# Patient Record
Sex: Female | Born: 1940 | Race: White | Hispanic: No | Smoking: Former smoker
Health system: Southern US, Community
[De-identification: ages and names within clinical notes are randomized; demographics above are authoritative.]

## PROBLEM LIST (undated history)

## (undated) DIAGNOSIS — K5792 Diverticulitis of intestine, part unspecified, without perforation or abscess without bleeding: Secondary | ICD-10-CM

## (undated) DIAGNOSIS — E785 Hyperlipidemia, unspecified: Secondary | ICD-10-CM

## (undated) DIAGNOSIS — M8580 Other specified disorders of bone density and structure, unspecified site: Secondary | ICD-10-CM

## (undated) DIAGNOSIS — N390 Urinary tract infection, site not specified: Secondary | ICD-10-CM

## (undated) DIAGNOSIS — R112 Nausea with vomiting, unspecified: Secondary | ICD-10-CM

## (undated) DIAGNOSIS — E559 Vitamin D deficiency, unspecified: Secondary | ICD-10-CM

## (undated) DIAGNOSIS — K589 Irritable bowel syndrome without diarrhea: Secondary | ICD-10-CM

## (undated) DIAGNOSIS — H269 Unspecified cataract: Secondary | ICD-10-CM

## (undated) DIAGNOSIS — K649 Unspecified hemorrhoids: Secondary | ICD-10-CM

## (undated) DIAGNOSIS — Z9889 Other specified postprocedural states: Secondary | ICD-10-CM

## (undated) DIAGNOSIS — I700 Atherosclerosis of aorta: Secondary | ICD-10-CM

## (undated) DIAGNOSIS — H9190 Unspecified hearing loss, unspecified ear: Secondary | ICD-10-CM

## (undated) DIAGNOSIS — K5730 Diverticulosis of large intestine without perforation or abscess without bleeding: Secondary | ICD-10-CM

## (undated) DIAGNOSIS — K56609 Unspecified intestinal obstruction, unspecified as to partial versus complete obstruction: Secondary | ICD-10-CM

## (undated) DIAGNOSIS — M810 Age-related osteoporosis without current pathological fracture: Secondary | ICD-10-CM

## (undated) DIAGNOSIS — I10 Essential (primary) hypertension: Secondary | ICD-10-CM

## (undated) DIAGNOSIS — K219 Gastro-esophageal reflux disease without esophagitis: Secondary | ICD-10-CM

## (undated) HISTORY — PX: APPENDECTOMY: SHX54

## (undated) HISTORY — DX: Gastro-esophageal reflux disease without esophagitis: K21.9

## (undated) HISTORY — PX: HEMORRHOID SURGERY: SHX153

## (undated) HISTORY — PX: ABDOMINAL HYSTERECTOMY: SHX81

## (undated) HISTORY — DX: Unspecified hearing loss, unspecified ear: H91.90

## (undated) HISTORY — DX: Irritable bowel syndrome, unspecified: K58.9

## (undated) HISTORY — DX: Hyperlipidemia, unspecified: E78.5

## (undated) HISTORY — PX: TONSILLECTOMY: SUR1361

## (undated) HISTORY — PX: EYE SURGERY: SHX253

## (undated) HISTORY — DX: Age-related osteoporosis without current pathological fracture: M81.0

## (undated) HISTORY — DX: Essential (primary) hypertension: I10

## (undated) HISTORY — DX: Vitamin D deficiency, unspecified: E55.9

## (undated) HISTORY — PX: CATARACT EXTRACTION: SUR2

## (undated) HISTORY — DX: Unspecified intestinal obstruction, unspecified as to partial versus complete obstruction: K56.609

## (undated) HISTORY — DX: Unspecified hemorrhoids: K64.9

## (undated) HISTORY — DX: Unspecified cataract: H26.9

## (undated) HISTORY — PX: BILATERAL OOPHORECTOMY: SHX1221

## (undated) HISTORY — PX: PARTIAL HYSTERECTOMY: SHX80

## (undated) HISTORY — PX: BREAST LUMPECTOMY: SHX2

---

## 2000-02-05 ENCOUNTER — Other Ambulatory Visit: Admission: RE | Admit: 2000-02-05 | Discharge: 2000-02-05 | Payer: Self-pay | Admitting: Obstetrics & Gynecology

## 2000-12-09 ENCOUNTER — Encounter: Payer: Self-pay | Admitting: Family Medicine

## 2000-12-09 ENCOUNTER — Encounter: Admission: RE | Admit: 2000-12-09 | Discharge: 2000-12-09 | Payer: Self-pay | Admitting: Family Medicine

## 2005-06-16 ENCOUNTER — Other Ambulatory Visit: Admission: RE | Admit: 2005-06-16 | Discharge: 2005-06-16 | Payer: Self-pay | Admitting: Obstetrics & Gynecology

## 2006-03-03 ENCOUNTER — Ambulatory Visit: Payer: Self-pay | Admitting: Cardiology

## 2006-04-21 ENCOUNTER — Ambulatory Visit: Payer: Self-pay | Admitting: Gastroenterology

## 2006-04-30 ENCOUNTER — Encounter: Payer: Self-pay | Admitting: Family Medicine

## 2006-04-30 ENCOUNTER — Ambulatory Visit: Payer: Self-pay | Admitting: Gastroenterology

## 2006-05-12 ENCOUNTER — Ambulatory Visit: Payer: Self-pay

## 2006-12-28 ENCOUNTER — Encounter: Admission: RE | Admit: 2006-12-28 | Discharge: 2006-12-28 | Payer: Self-pay | Admitting: Obstetrics & Gynecology

## 2006-12-31 ENCOUNTER — Encounter (INDEPENDENT_AMBULATORY_CARE_PROVIDER_SITE_OTHER): Payer: Self-pay | Admitting: *Deleted

## 2006-12-31 ENCOUNTER — Encounter: Admission: RE | Admit: 2006-12-31 | Discharge: 2006-12-31 | Payer: Self-pay | Admitting: Obstetrics & Gynecology

## 2007-01-21 ENCOUNTER — Encounter (INDEPENDENT_AMBULATORY_CARE_PROVIDER_SITE_OTHER): Payer: Self-pay | Admitting: *Deleted

## 2007-01-21 ENCOUNTER — Encounter: Admission: RE | Admit: 2007-01-21 | Discharge: 2007-01-21 | Payer: Self-pay | Admitting: General Surgery

## 2007-01-21 ENCOUNTER — Ambulatory Visit (HOSPITAL_BASED_OUTPATIENT_CLINIC_OR_DEPARTMENT_OTHER): Admission: RE | Admit: 2007-01-21 | Discharge: 2007-01-21 | Payer: Self-pay | Admitting: General Surgery

## 2010-08-06 ENCOUNTER — Encounter
Admission: RE | Admit: 2010-08-06 | Discharge: 2010-10-23 | Payer: Self-pay | Source: Home / Self Care | Attending: Family Medicine | Admitting: Family Medicine

## 2010-09-04 ENCOUNTER — Telehealth (INDEPENDENT_AMBULATORY_CARE_PROVIDER_SITE_OTHER): Payer: Self-pay | Admitting: Radiology

## 2010-09-08 ENCOUNTER — Encounter: Payer: Self-pay | Admitting: Cardiology

## 2010-09-08 ENCOUNTER — Ambulatory Visit: Payer: Self-pay | Admitting: Cardiology

## 2010-09-08 ENCOUNTER — Encounter (HOSPITAL_COMMUNITY)
Admission: RE | Admit: 2010-09-08 | Discharge: 2010-10-25 | Payer: Self-pay | Source: Home / Self Care | Attending: Family Medicine | Admitting: Family Medicine

## 2010-09-08 ENCOUNTER — Ambulatory Visit: Payer: Self-pay

## 2010-10-26 ENCOUNTER — Encounter: Admission: RE | Admit: 2010-10-26 | Payer: Self-pay | Source: Home / Self Care | Admitting: Family Medicine

## 2010-11-25 NOTE — Procedures (Signed)
Summary: Colonoscopy Report/[facility]  Colonoscopy Report/[facility]   Imported By: Maryln Gottron 03/19/2010 14:18:37  _____________________________________________________________________  External Attachment:    Type:   Image     Comment:   External Document

## 2010-11-25 NOTE — Progress Notes (Signed)
Summary: Nuc Pre-Procedure  Phone Note Outgoing Call Call back at [facility] Phone [phone]   Call placed by: Leonia Corona, RT-N,  September 04, 2010 2:54 PM Call placed to: Patient Reason for Call: Confirm/change Appt Summary of Call: Left message with information on Myoview Information Sheet (see scanned document for details).      Nuclear Med Background Indications for Stress Test: Evaluation for Ischemia   History: Myocardial Perfusion Study  History Comments: 2007- Normal MPS(+EKG). EF= 76%     Nuclear Pre-Procedure Cardiac Risk Factors: Family History - CAD, History of Smoking, Lipids

## 2010-11-25 NOTE — Assessment & Plan Note (Signed)
Summary: Cardiology Nuclear Testing  Nuclear Med Background Indications for Stress Test: Evaluation for Ischemia   History: Myocardial Perfusion Study  History Comments: '07 NWG:NFAOZH, (+EKG), EF=76%   Symptoms Comments: No cardiac complaints   Nuclear Pre-Procedure Cardiac Risk Factors: Family History - CAD, History of Smoking, Hypertension, Lipids Caffeine/Decaff Intake: None NPO After: 10:00 PM Lungs: Clear IV 0.9% NS with Angio Cath: 20g     IV Site: R Antecubital IV Started by: Stanton Kidney, EMT-P Chest Size (in) 34     Cup Size B     Height (in): 60.5 Weight (lb): 119 BMI: 22.94  Nuclear Med Study 1 or 2 day study:  1 day     Stress Test Type:  Stress Reading MD:  Marca Ancona, MD     Referring MD:  Rudi Heap, MD Resting Radionuclide:  Technetium 66m Tetrofosmin     Resting Radionuclide Dose:  11 mCi  Stress Radionuclide:  Technetium 41m Tetrofosmin     Stress Radionuclide Dose:  33 mCi   Stress Protocol Exercise Time (min):  7:45 min     Max HR:  151 bpm     Predicted Max HR:  151 bpm  Max Systolic BP: 216 mm Hg     Percent Max HR:  100 %     METS: 9.7 Rate Pressure Product:  08657    Stress Test Technologist:  [employer], CMA-N     Nuclear Technologist:  Domenic Polite, CNMT  Rest Procedure  Myocardial perfusion imaging was performed at rest 45 minutes following the intravenous administration of Technetium 48m Tetrofosmin.  Stress Procedure  The patient exercised for 7:45.  The patient stopped due to fatigue and denied any chest pain.  There were ST-T wave changes and brief runs of nonsustained v-tach.  She had a mild hypertensive response to exercise, 216/53.  Technetium 24m Tetrofosmin was injected at peak exercise and myocardial perfusion imaging was performed after a brief delay.  QPS Raw Data Images:  Normal; no motion artifact; normal heart/lung ratio. Stress Images:  Normal homogeneous uptake in all areas of the myocardium. Rest Images:  Normal  homogeneous uptake in all areas of the myocardium. Subtraction (SDS):  There is no evidence of scar or ischemia. Transient Ischemic Dilatation:  0.93  (Normal <1.22)  Lung/Heart Ratio:  0.25  (Normal <0.45)  Quantitative Gated Spect Images QGS EDV:  49 ml QGS ESV:  9 ml QGS EF:  82 % QGS cine images:  Normal wall motion.    Overall Impression  Exercise Capacity: Good exercise capacity. BP Response: Hypertensive blood pressure response. Clinical Symptoms: Fatigue, no chest pain.  ECG Impression: 1-2 mm horizontal ST depression inferior leads and V4-V6.  Resolved by 2 minutes in recovery.  Overall Impression: Normal scintographic images but positive ECG.  She had the same findings on ETT-myoview in 2007 (normal perfusion but positive ECG).  Overall Impression Comments: Transient ischemic dilation ratio 0.93.   Appended Document: Cardiology Nuclear Testing copy faxed to Dr .Christell Constant

## 2011-03-13 NOTE — Op Note (Signed)
Dorothy Pitts, Dorothy Pitts             ACCOUNT NO.:  [id number]   MEDICAL RECORD NO.:  [id number]          PATIENT TYPE:  AMB   LOCATION:  DSC                          FACILITY:  [facility]   PHYSICIAN:  Rose Phi. Maple Hudson, M.D.   DATE OF BIRTH:  08-20-1941   DATE OF PROCEDURE:  01/21/2007  DATE OF DISCHARGE:                               OPERATIVE REPORT   PREOPERATIVE DIAGNOSIS:  Sclerosing lesion left breast.   POSTOPERATIVE DIAGNOSIS:  Sclerosing lesion left breast.   OPERATION:  Left breast biopsy with needle localization and specimen  mammogram.   SURGEON:  Rose Phi. Maple Hudson, M.D.   ANESTHESIA:  MAC.   OPERATIVE PROCEDURE:  Prior to coming to the operating room, a  localizing wire had been placed at the area of the sclerosing lesion.  The wire was in the lesion but the previously placed clip had migrated  and was quite a ways away so our goal was not to remove the clip but to  get the sclerosing lesion.   The patient placed on the operating table, the arms extended on the arm  board.  The left breast was prepped and draped in the usual fashion.  The approach was from the 3 o'clock position laterally, so I outlined  the radial incision incorporating the wire.  The area was then  thoroughly infiltrated with local anesthetic.  Incision was made and a  wide excision of the wire and surrounding tissue was carried out.   Specimen mammogram confirmed the removal of the lesion but not the clip.   With good hemostasis, the subcuticular closure of 4-0 Monocryl and an  application of Dermabond was carried out.  A light dressing was applied.  The patient transferred to the recovery room in satisfactory condition  having tolerated the procedure well.      Rose Phi. Maple Hudson, M.D.  Electronically Signed     PRY/MEDQ  D:  01/21/2007  T:  01/21/2007  Job:  [id number]

## 2011-07-15 ENCOUNTER — Encounter: Payer: Self-pay | Admitting: Gastroenterology

## 2012-07-15 ENCOUNTER — Encounter: Payer: Self-pay | Admitting: Gastroenterology

## 2012-07-22 ENCOUNTER — Encounter: Payer: Self-pay | Admitting: Gastroenterology

## 2012-08-29 ENCOUNTER — Ambulatory Visit (AMBULATORY_SURGERY_CENTER): Payer: Medicare Other | Admitting: *Deleted

## 2012-08-29 ENCOUNTER — Encounter: Payer: Self-pay | Admitting: Gastroenterology

## 2012-08-29 VITALS — Ht 60.0 in | Wt 122.1 lb

## 2012-08-29 DIAGNOSIS — Z86010 Personal history of colonic polyps: Secondary | ICD-10-CM

## 2012-08-29 DIAGNOSIS — Z1211 Encounter for screening for malignant neoplasm of colon: Secondary | ICD-10-CM

## 2012-08-29 MED ORDER — MOVIPREP 100 G PO SOLR: ORAL | Status: DC

## 2012-08-29 NOTE — Progress Notes (Signed)
Patient denies ANY family history of colon cancer. She has 1 brother, he has never had cancer. Last colonoscopy report states family hx colon cancer in sibling at age 71, patient denies this.

## 2012-09-09 ENCOUNTER — Ambulatory Visit (AMBULATORY_SURGERY_CENTER): Payer: Medicare Other | Admitting: Gastroenterology

## 2012-09-09 ENCOUNTER — Encounter: Payer: Self-pay | Admitting: Gastroenterology

## 2012-09-09 VITALS — BP 151/70 | HR 71 | Temp 98.4°F | Resp 29 | Ht 60.0 in | Wt 122.0 lb

## 2012-09-09 DIAGNOSIS — K5730 Diverticulosis of large intestine without perforation or abscess without bleeding: Secondary | ICD-10-CM

## 2012-09-09 DIAGNOSIS — Z1211 Encounter for screening for malignant neoplasm of colon: Secondary | ICD-10-CM

## 2012-09-09 MED ORDER — SODIUM CHLORIDE 0.9 % IV SOLN: 500.0000 mL | INTRAVENOUS | Status: DC

## 2012-09-09 NOTE — Progress Notes (Addendum)
Pt c/o of a "tummy ache,"  Rates pain as a 5.  HOB down and knees to chest.  Pt states she doesn't feel the urge to pass air. 1405-Levsin 0.125mg  2 tablets SL given and pt turned to right side.  Encouraged to pass air.  1415- States pain is a little better, rates as a "3."  Pt turned back to Left side  1427- Pt's abdomen still distended.  Up to BR in order to try to pass air  1440- Pt states she was able to pass a lot of air; no further complaints of pain   Patient did not experience any of the following events: a burn prior to discharge; a fall within the facility; wrong site/side/patient/procedure/implant event; or a hospital transfer or hospital admission upon discharge from the facility. [phone]) Patient did not have preoperative order for IV antibiotic SSI prophylaxis. [id number])

## 2012-09-09 NOTE — Op Note (Signed)
[facility] [address] [place], [address]   COLONOSCOPY PROCEDURE REPORT  PATIENT: Dorothy Pitts, Dorothy Pitts  MR#: [id number] BIRTHDATE: 08-Jun-1941 , 71  yrs. old GENDER: Female ENDOSCOPIST: Mardella Layman, MD, [facility] REFERRED BY: PROCEDURE DATE:  09/09/2012 PROCEDURE:   Colonoscopy, screening ASA CLASS:   Class II INDICATIONS:average risk patient for colon cancer. MEDICATIONS: propofol (Diprivan) 200mg  IV  DESCRIPTION OF PROCEDURE:   After the risks and benefits and of the procedure were explained, informed consent was obtained.  A digital rectal exam revealed no abnormalities of the rectum.    The LB [id number]  endoscope was introduced through the anus and advanced to the cecum, which was identified by both the appendix and ileocecal valve .  The quality of the prep was good, using MoviPrep .  The instrument was then slowly withdrawn as the colon was fully examined.     COLON FINDINGS: There was severe diverticulosis noted in the descending colon and sigmoid colon with associated luminal narrowing and muscular hypertrophy.   A normal appearing cecum, ileocecal valve, and appendiceal orifice were identified.  The ascending, hepatic flexure, transverse, splenic flexure, descending, sigmoid colon and rectum appeared unremarkable.  No polyps or cancers were seen.     Retroflexed views revealed no abnormalities.     The scope was then withdrawn from the patient and the procedure completed.  COMPLICATIONS: There were no complications. ENDOSCOPIC IMPRESSION: 1.   There was severe diverticulosis noted in the descending colon and sigmoid colon 2.   Normal colon ,.,,no polyps or cancer  RECOMMENDATIONS: Continue current colorectal screening recommendations for "routine risk" patients with a repeat colonoscopy in 10 years.   REPEAT EXAM:  XB:JYNWGN Christell Constant, MD  _______________________________ eSigned:  Mardella Layman, MD, [facility] 09/09/2012 1:54  PM

## 2012-09-09 NOTE — Patient Instructions (Addendum)
YOU HAD AN ENDOSCOPIC PROCEDURE TODAY AT THE [facility]: Refer to the procedure report that was given to you for any specific questions about what was found during the examination.  If the procedure report does not answer your questions, please call your gastroenterologist to clarify.  If you requested that your care partner not be given the details of your procedure findings, then the procedure report has been included in a sealed envelope for you to review at your convenience later.  YOU SHOULD EXPECT: Some feelings of bloating in the abdomen. Passage of more gas than usual.  Walking can help get rid of the air that was put into your GI tract during the procedure and reduce the bloating. If you had a lower endoscopy (such as a colonoscopy or flexible sigmoidoscopy) you may notice spotting of blood in your stool or on the toilet paper. If you underwent a bowel prep for your procedure, then you may not have a normal bowel movement for a few days.  DIET: Your first meal following the procedure should be a light meal and then it is ok to progress to your normal diet.  A half-sandwich or bowl of soup is an example of a good first meal.  Heavy or fried foods are harder to digest and may make you feel nauseous or bloated.  Likewise meals heavy in dairy and vegetables can cause extra gas to form and this can also increase the bloating.  Drink plenty of fluids but you should avoid alcoholic beverages for 24 hours.  ACTIVITY: Your care partner should take you home directly after the procedure.  You should plan to take it easy, moving slowly for the rest of the day.  You can resume normal activity the day after the procedure however you should NOT DRIVE or use heavy machinery for 24 hours (because of the sedation medicines used during the test).    SYMPTOMS TO REPORT IMMEDIATELY: A gastroenterologist can be reached at any hour.  During normal business hours, 8:30 AM to 5:00 PM Monday through Friday,  call [phone].  After hours and on weekends, please call the GI answering service at [phone] who will take a message and have the physician on call contact you.   Following lower endoscopy (colonoscopy or flexible sigmoidoscopy):  Excessive amounts of blood in the stool  Significant tenderness or worsening of abdominal pains  Swelling of the abdomen that is new, acute  Fever of 100F or higher FOLLOW UP:  Our staff will call the home number listed on your records the next business day following your procedure to check on you and address any questions or concerns that you may have at that time regarding the information given to you following your procedure. This is a courtesy call and so if there is no answer at the home number and we have not heard from you through the emergency physician on call, we will assume that you have returned to your regular daily activities without incident.  SIGNATURES/CONFIDENTIALITY: You and/or your care partner have signed paperwork which will be entered into your electronic medical record.  These signatures attest to the fact that that the information above on your After Visit Summary has been reviewed and is understood.  Full responsibility of the confidentiality of this discharge information lies with you and/or your care-partner.   Ok to continue your normal medications  Follow up colonoscopy in 10 years  Please read over information given about diverticulosis and high fiber diets

## 2012-09-12 ENCOUNTER — Telehealth: Payer: Self-pay

## 2012-09-12 NOTE — Telephone Encounter (Signed)
  Follow up Call-  Call back number 09/09/2012  Post procedure Call Back phone  # [phone]  Permission to leave phone message Yes     Patient questions:  Do you have a fever, pain , or abdominal swelling? no Pain Score  0 *  Have you tolerated food without any problems? yes  Have you been able to return to your normal activities? yes  Do you have any questions about your discharge instructions: Diet   no Medications  no Follow up visit  no  Do you have questions or concerns about your Care? no  Actions: * If pain score is 4 or above: No action needed, pain <4.

## 2013-01-12 ENCOUNTER — Encounter: Payer: Self-pay | Admitting: Family Medicine

## 2013-01-12 ENCOUNTER — Ambulatory Visit: Payer: Medicare Other

## 2013-01-12 ENCOUNTER — Other Ambulatory Visit: Payer: Self-pay | Admitting: Nurse Practitioner

## 2013-01-12 ENCOUNTER — Ambulatory Visit (INDEPENDENT_AMBULATORY_CARE_PROVIDER_SITE_OTHER): Payer: Medicare Other | Admitting: Family Medicine

## 2013-01-12 ENCOUNTER — Ambulatory Visit (HOSPITAL_COMMUNITY)
Admission: RE | Admit: 2013-01-12 | Discharge: 2013-01-12 | Disposition: A | Discharge status: Home / Self Care | Payer: Medicare Other | Source: Ambulatory Visit | Attending: Family Medicine | Admitting: Family Medicine

## 2013-01-12 VITALS — BP 97/57 | HR 83 | Temp 97.9°F | Ht 60.5 in | Wt 120.0 lb

## 2013-01-12 DIAGNOSIS — K7689 Other specified diseases of liver: Secondary | ICD-10-CM | POA: Insufficient documentation

## 2013-01-12 DIAGNOSIS — K5732 Diverticulitis of large intestine without perforation or abscess without bleeding: Secondary | ICD-10-CM

## 2013-01-12 DIAGNOSIS — R109 Unspecified abdominal pain: Secondary | ICD-10-CM | POA: Insufficient documentation

## 2013-01-12 DIAGNOSIS — K5730 Diverticulosis of large intestine without perforation or abscess without bleeding: Secondary | ICD-10-CM

## 2013-01-12 DIAGNOSIS — M79609 Pain in unspecified limb: Secondary | ICD-10-CM | POA: Insufficient documentation

## 2013-01-12 DIAGNOSIS — M79662 Pain in left lower leg: Secondary | ICD-10-CM

## 2013-01-12 LAB — POCT URINALYSIS DIPSTICK
Bilirubin, UA: NEGATIVE
Nitrite, UA: NEGATIVE
Spec Grav, UA: 1.02
pH, UA: 6

## 2013-01-12 LAB — POCT CBC
HCT, POC: 36.2 % — AB (ref 37.7–47.9)
Hemoglobin: 12.1 g/dL — AB (ref 12.2–16.2)
MCH, POC: 31.4 pg — AB (ref 27–31.2)
MCHC: 33.4 g/dL (ref 31.8–35.4)

## 2013-01-12 LAB — POCT UA - MICROSCOPIC ONLY
Mucus, UA: NEGATIVE
Yeast, UA: NEGATIVE

## 2013-01-12 MED ORDER — IOHEXOL 300 MG/ML  SOLN
100.0000 mL | Freq: Once | INTRAMUSCULAR | Status: AC | PRN
  Administered 2013-01-12: 100 mL via INTRAVENOUS

## 2013-01-12 MED ORDER — METRONIDAZOLE 500 MG PO TABS: 500.0000 mg | ORAL_TABLET | Freq: Two times a day (BID) | ORAL | Status: DC

## 2013-01-12 NOTE — Progress Notes (Signed)
  Subjective:    Patient ID: Dorothy Pitts, female    DOB: 1941/02/09, 72 y.o.   MRN: [id number]  HPI This patient presents for evaluation of abdominal pain and left calf pain. The abdominal pain was very severe in the middle of the night. She has changed her diet over the past 2 weeks to a gluten-free diet.  No one accompanies the patient.  Medications and allergies reviewed.    Review of Systems  Constitutional: Positive for appetite change.  Gastrointestinal: Positive for nausea, abdominal pain (lower) and constipation. Negative for vomiting, diarrhea, blood in stool, anal bleeding and rectal pain.  Genitourinary: Negative.        Objective:   Physical Exam  BP 97/57  Pulse 83  Temp(Src) 97.9 F (36.6 C) (Oral)  Ht 5' 0.5" (1.537 m)  Wt 120 lb (54.432 kg)  BMI 23.04 kg/m2  The patient appeared well nourished and normally developed, alert and oriented to time and place. Speech, behavior and judgement appear normal. Vital signs as documented.  Head exam is unremarkable. No scleral icterus or pallor noted.  Lungs are clear anteriorly and posteriorly to auscultation. Normal respiratory effort. Cardiac exam reveals regular rate and rhythm. First and second heart sounds normal. No murmurs, rubs or gallops.  Abdominal exam reveals normal bowl sounds, no masses, no organomegaly and no aortic enlargement. No inguinal adenopathy. Slight suprapubic and right lower quadrant tenderness Extremities are nonedematous and both femoral and pedal pulses are normal. Left lower extremity had no edema and good the Homans sign was negative. Skin without pallor or jaundice.  Warm and dry, without rash. ..     [facility] reading (PRIMARY) by  Me of KUB revealed a nonspecific bowel pattern                                         Assessment & Plan:  1. Abdominal  pain, other specified site Advised to return to normal diet - POCT CBC - DG Abd 1 View - CT Abdomen Pelvis W Contrast;  Future - POCT UA - Microscopic Only - POCT urinalysis dipstick  2. Diverticulosis of colon without hemorrhage  - CT Abdomen Pelvis W Contrast; Future  3. Calf pain, left  - US Venous Img Lower Unilateral Left; Future

## 2013-01-12 NOTE — Patient Instructions (Addendum)
Patient instructed to return to her normal diet X-rays are planned Symptoms worse she should go to the emergency room

## 2013-01-13 ENCOUNTER — Telehealth: Payer: Self-pay | Admitting: *Deleted

## 2013-01-13 NOTE — Telephone Encounter (Signed)
lmom for pt to call back to schedule her f/u appt.

## 2013-01-13 NOTE — Telephone Encounter (Signed)
Pt will see Dr Jarold Motto on 01/27/13 at 10am. Mailed her a NP form.

## 2013-01-13 NOTE — Telephone Encounter (Signed)
Message copied by Florene Glen on Fri Jan 13, 2013  8:32 AM ------      Message from: Jarold Motto, DAVID R      Created: Fri Jan 13, 2013  8:07 AM       Who  Ordered this ,???did she get treated ------

## 2013-01-20 ENCOUNTER — Encounter: Payer: Self-pay | Admitting: *Deleted

## 2013-01-20 DIAGNOSIS — E785 Hyperlipidemia, unspecified: Secondary | ICD-10-CM | POA: Insufficient documentation

## 2013-01-20 DIAGNOSIS — Z87891 Personal history of nicotine dependence: Secondary | ICD-10-CM | POA: Insufficient documentation

## 2013-01-20 DIAGNOSIS — Z8669 Personal history of other diseases of the nervous system and sense organs: Secondary | ICD-10-CM | POA: Insufficient documentation

## 2013-01-20 DIAGNOSIS — Z79899 Other long term (current) drug therapy: Secondary | ICD-10-CM | POA: Insufficient documentation

## 2013-01-20 DIAGNOSIS — R1013 Epigastric pain: Secondary | ICD-10-CM | POA: Insufficient documentation

## 2013-01-20 DIAGNOSIS — Z8719 Personal history of other diseases of the digestive system: Secondary | ICD-10-CM | POA: Insufficient documentation

## 2013-01-20 DIAGNOSIS — I10 Essential (primary) hypertension: Secondary | ICD-10-CM | POA: Insufficient documentation

## 2013-01-20 DIAGNOSIS — K589 Irritable bowel syndrome without diarrhea: Secondary | ICD-10-CM | POA: Insufficient documentation

## 2013-01-20 DIAGNOSIS — Z90710 Acquired absence of both cervix and uterus: Secondary | ICD-10-CM | POA: Insufficient documentation

## 2013-01-20 DIAGNOSIS — Z8679 Personal history of other diseases of the circulatory system: Secondary | ICD-10-CM | POA: Insufficient documentation

## 2013-01-20 DIAGNOSIS — K219 Gastro-esophageal reflux disease without esophagitis: Secondary | ICD-10-CM | POA: Insufficient documentation

## 2013-01-20 DIAGNOSIS — Z9089 Acquired absence of other organs: Secondary | ICD-10-CM | POA: Insufficient documentation

## 2013-01-20 LAB — COMPREHENSIVE METABOLIC PANEL
ALT: 16 U/L (ref 0–35)
AST: 21 U/L (ref 0–37)
Alkaline Phosphatase: 33 U/L — ABNORMAL LOW (ref 39–117)
Calcium: 9 mg/dL (ref 8.4–10.5)
Potassium: 4 mEq/L (ref 3.5–5.1)
Sodium: 132 mEq/L — ABNORMAL LOW (ref 135–145)
Total Protein: 6.5 g/dL (ref 6.0–8.3)

## 2013-01-20 LAB — URINALYSIS, ROUTINE W REFLEX MICROSCOPIC
Nitrite: NEGATIVE
Protein, ur: NEGATIVE mg/dL
Specific Gravity, Urine: 1.025 (ref 1.005–1.030)
Urobilinogen, UA: 0.2 mg/dL (ref 0.0–1.0)

## 2013-01-20 LAB — CBC WITH DIFFERENTIAL/PLATELET
Basophils Absolute: 0 10*3/uL (ref 0.0–0.1)
Eosinophils Absolute: 0 10*3/uL (ref 0.0–0.7)
Eosinophils Relative: 0 % (ref 0–5)
Lymphocytes Relative: 17 % (ref 12–46)
Lymphs Abs: 1.4 10*3/uL (ref 0.7–4.0)
MCV: 90.8 fL (ref 78.0–100.0)
Neutrophils Relative %: 77 % (ref 43–77)
Platelets: 365 10*3/uL (ref 150–400)
RBC: 4.03 MIL/uL (ref 3.87–5.11)
RDW: 11.9 % (ref 11.5–15.5)
WBC: 8.1 10*3/uL (ref 4.0–10.5)

## 2013-01-20 LAB — URINE MICROSCOPIC-ADD ON

## 2013-01-20 NOTE — ED Notes (Signed)
The pt was diagnosed with diverticulitis last week. Today she has epigastric pain severe with  Posterior radiation nausea.  She last ate 1300 today

## 2013-01-21 ENCOUNTER — Emergency Department (HOSPITAL_COMMUNITY)
Admission: EM | Admit: 2013-01-21 | Discharge: 2013-01-21 | Disposition: A | Discharge status: Home / Self Care | Payer: Medicare Other | Attending: Emergency Medicine | Admitting: Emergency Medicine

## 2013-01-21 ENCOUNTER — Emergency Department (HOSPITAL_COMMUNITY): Payer: Medicare Other

## 2013-01-21 DIAGNOSIS — R109 Unspecified abdominal pain: Secondary | ICD-10-CM

## 2013-01-21 HISTORY — DX: Diverticulosis of large intestine without perforation or abscess without bleeding: K57.30

## 2013-01-21 LAB — POCT I-STAT TROPONIN I: Troponin i, poc: 0 ng/mL (ref 0.00–0.08)

## 2013-01-21 NOTE — ED Notes (Signed)
Pt verbalized understanding of d/c.  No additional questions by pt regarding d/c or f/u. VSS.  Resps e/u.  E-signature obtained.

## 2013-01-21 NOTE — ED Provider Notes (Signed)
History     CSN: [id number]  Arrival date & time 01/20/13  2123   First MD Initiated Contact with Patient 01/21/13 0044      Chief Complaint  Patient presents with  . Abdominal Pain    (Consider location/radiation/quality/duration/timing/severity/associated sxs/prior treatment) HPI  This patient is a very pleasant 72 year old woman with IBS, HTN, HL, who was diagnosed with sigmoid diverticulitis by CT scan on March 22 at [facility]. She was started on amoxicillin and Flagyl for treatment.  Today, she presents after experiencing diffuse epigastric pain which radiated to her back and was associated with nausea and an urge to defecate with a feeling that she was going to have diarrhea. She did not vomit and did not have any diarrhea stools. No bloody stools or mucousy stools. She describes her symptoms as mild. She is pain free at this time. Her symptoms resolved without intervention over the course of about 2 hours.  Her daughter notes that she appeared diaphoretic at one point. Patient says that her pain feels different in nature and is in a different location than that which accompanied dx of diverticulitis. She denies cp, sob, GU sx.  Says she has had similar sx before with IBS.   Dtr called PCP's nurse and says she was told to come straight to the ED because inflammed diverticula may have perforated.   Past Medical History  Diagnosis Date  . Hyperlipidemia   . Hypertension   . IBS (irritable bowel syndrome)   . GERD (gastroesophageal reflux disease)   . Cataract   . Hemorrhoids   . Diverticulosis of colon (without mention of hemorrhage)     Past Surgical History  Procedure Laterality Date  . Appendectomy    . Partial hysterectomy    . Bilateral oophorectomy    . Hemorrhoid surgery    . Breast lumpectomy      left     Family History  Problem Relation Age of Onset  . Colon cancer Neg Hx     History  Substance Use Topics  . Smoking status: Former Smoker    Quit date: 01/12/1998  . Smokeless tobacco: Never Used     Comment: quit smoking 10 years ago  . Alcohol Use: 3.0 oz/week    5 Glasses of wine per week    OB History   Grav Para Term Preterm Abortions TAB SAB Ect Mult Living                  Review of Systems Gen: no weight loss, fevers, chills, night sweats Eyes: no discharge or drainage, no occular pain or visual changes Nose: no epistaxis or rhinorrhea Mouth: no dental pain, no sore throat Neck: no neck pain Lungs: no SOB, cough, wheezing CV: no chest pain, palpitations, dependent edema or orthopnea Abd: no abdominal pain, nausea, vomiting GU: As per history of present illness, otherwise negative MSK: no myalgias or arthralgias Neuro: no headache, no focal neurologic deficits Skin: no rash Psyche: negative.  Allergies  Actonel; Fosamax; Lovaza; Nsaids; Cephalexin; and Ciprofloxacin  Home Medications   Current Outpatient Rx  Name  Route  Sig  Dispense  Refill  . Ascorbic Acid (VITAMIN C) 1000 MG tablet   Oral   Take 1,000 mg by mouth daily.          . Calcium-Vitamin D (CALTRATE 600 PLUS-VIT D PO)   Oral   Take 1 tablet by mouth daily.         . Cholecalciferol (VITAMIN  D3) 2000 UNITS TABS   Oral   Take 1 tablet by mouth daily.         . enalapril (VASOTEC) 5 MG tablet   Oral   Take 5 mg by mouth daily.         . metroNIDAZOLE (FLAGYL) 500 MG tablet   Oral   Take 1 tablet (500 mg total) by mouth 2 (two) times daily.   20 tablet   0   . Multiple Vitamin (MULTIVITAMIN) tablet   Oral   Take 1 tablet by mouth daily.         . Psyllium (METAMUCIL PO)   Oral   Take 2 tablets by mouth at bedtime.         . raloxifene (EVISTA) 60 MG tablet   Oral   Take 60 mg by mouth daily.         . ranitidine (ZANTAC) 150 MG tablet   Oral   Take 150 mg by mouth 2 (two) times daily.         [name] Kitchen spironolactone (ALDACTONE) 25 MG tablet   Oral   Take 25 mg by mouth daily.         [name] Kitchen  sulfamethoxazole-trimethoprim (BACTRIM DS,SEPTRA DS) 800-160 MG per tablet   Oral   Take 1 tablet by mouth 2 (two) times daily.         . vitamin E (VITAMIN E) 400 UNIT capsule   Oral   Take 400 Units by mouth daily.         [name] Kitchen zolpidem (AMBIEN) 5 MG tablet   Oral   Take 2.5 mg by mouth at bedtime.           BP 106/61  Pulse 67  Temp(Src) 98.2 F (36.8 C) (Oral)  Resp 20  SpO2 98%  Physical Exam Gen: well developed and well nourished appearing Head: NCAT Eyes: PERL, EOMI Nose: no epistaixis or rhinorrhea Mouth/throat: mucosa is moist and pink Neck: supple, no stridor Lungs: CTA B, no wheezing, rhonchi or rales CV: RRR, no murmur, no peripheral edema.  Abd: soft, notender, nondistended Back: no ttp, no cva ttp Skin: no rashese, wnl Neuro: CN ii-xii grossly intact, no focal deficits Psyche; normal affect,  calm and cooperative.   ED Course  Procedures (including critical care time) Results for orders placed during the hospital encounter of 01/21/13 (from the past 24 hour(s))  CBC WITH DIFFERENTIAL     Status: None   Collection Time    01/20/13  9:45 PM      Result Value Range   WBC 8.1  4.0 - 10.5 K/uL   RBC 4.03  3.87 - 5.11 MIL/uL   Hemoglobin 12.6  12.0 - 15.0 g/dL   HCT 40.9  81.1 - 91.4 %   MCV 90.8  78.0 - 100.0 fL   MCH 31.3  26.0 - 34.0 pg   MCHC 34.4  30.0 - 36.0 g/dL   RDW 78.2  95.6 - 21.3 %   Platelets 365  150 - 400 K/uL   Neutrophils Relative 77  43 - 77 %   Neutro Abs 6.3  1.7 - 7.7 K/uL   Lymphocytes Relative 17  12 - 46 %   Lymphs Abs 1.4  0.7 - 4.0 K/uL   Monocytes Relative 5  3 - 12 %   Monocytes Absolute 0.4  0.1 - 1.0 K/uL   Eosinophils Relative 0  0 - 5 %   Eosinophils Absolute 0.0  0.0 - 0.7  K/uL   Basophils Relative 1  0 - 1 %   Basophils Absolute 0.0  0.0 - 0.1 K/uL  COMPREHENSIVE METABOLIC PANEL     Status: Abnormal   Collection Time    01/20/13  9:45 PM      Result Value Range   Sodium 132 (*) 135 - 145 mEq/L   Potassium  4.0  3.5 - 5.1 mEq/L   Chloride 97  96 - 112 mEq/L   [name] 25  19 - 32 mEq/L   Glucose, Bld 102 (*) 70 - 99 mg/dL   BUN 20  6 - 23 mg/dL   Creatinine, Ser 4.54  0.50 - 1.10 mg/dL   Calcium 9.0  8.4 - 09.8 mg/dL   Total Protein 6.5  6.0 - 8.3 g/dL   Albumin 3.5  3.5 - 5.2 g/dL   AST 21  0 - 37 U/L   ALT 16  0 - 35 U/L   Alkaline Phosphatase 33 (*) 39 - 117 U/L   Total Bilirubin 0.1 (*) 0.3 - 1.2 mg/dL   GFR calc non Af Amer 86 (*) >90 mL/min   GFR calc Af Amer >90  >90 mL/min  LIPASE, BLOOD     Status: None   Collection Time    01/20/13  9:45 PM      Result Value Range   Lipase 37  11 - 59 U/L  URINALYSIS, ROUTINE W REFLEX MICROSCOPIC     Status: Abnormal   Collection Time    01/20/13  9:50 PM      Result Value Range   Color, Urine YELLOW  YELLOW   APPearance CLEAR  CLEAR   Specific Gravity, Urine 1.025  1.005 - 1.030   pH 5.5  5.0 - 8.0   Glucose, UA NEGATIVE  NEGATIVE mg/dL   Hgb urine dipstick TRACE (*) NEGATIVE   Bilirubin Urine NEGATIVE  NEGATIVE   Ketones, ur >80 (*) NEGATIVE mg/dL   Protein, ur NEGATIVE  NEGATIVE mg/dL   Urobilinogen, UA 0.2  0.0 - 1.0 mg/dL   Nitrite NEGATIVE  NEGATIVE   Leukocytes, UA MODERATE (*) NEGATIVE  URINE MICROSCOPIC-ADD ON     Status: Abnormal   Collection Time    01/20/13  9:50 PM      Result Value Range   Squamous Epithelial / LPF FEW (*) RARE   WBC, UA 7-10  <3 WBC/hpf   RBC / HPF 3-6  <3 RBC/hpf   Bacteria, UA RARE  RARE   Casts HYALINE CASTS (*) NEGATIVE   Urine-Other MUCOUS PRESENT    POCT I-STAT TROPONIN I     Status: None   Collection Time    01/21/13  1:07 AM      Result Value Range   Troponin i, poc 0.00  0.00 - 0.08 ng/mL   Comment 3            EKG: nsr, no acute ischemic changes, normal intervals, normal axis, normal qrs complex Dg Chest 2 View  01/21/2013  *RADIOLOGY REPORT*  Clinical Data:  Abdominal pain, history hypertension, hyperlipidemia, GERD, irritable bowel syndrome  CHEST - 2 VIEW  Comparison: None   Findings: Normal heart size, mediastinal contours, and pulmonary vascularity. Lungs emphysematous but clear. No pleural effusion or pneumothorax. No acute osseous findings.  IMPRESSION: Emphysematous changes. No acute abnormalities.   Original Report Authenticated By: Ulyses Southward, M.D.     Ddx: ibs, pancreatitis, hepatitis, diverticulitis, uti, acs     MDM  Repeat abdominal exam is  benign. Negative troponin 12 hrs after atypical epigastric pain began. Pt symptom free. Stable for discharge. Reassured and very pleased with plan. Advised to return promptly to the ED for worsening or red flag sx and to f/u with PCP Monday.         Brandt Loosen, MD 01/21/13 [phone]

## 2013-01-24 ENCOUNTER — Ambulatory Visit: Payer: Self-pay | Admitting: Family Medicine

## 2013-01-27 ENCOUNTER — Ambulatory Visit (INDEPENDENT_AMBULATORY_CARE_PROVIDER_SITE_OTHER): Payer: Medicare Other | Admitting: Gastroenterology

## 2013-01-27 ENCOUNTER — Encounter: Payer: Self-pay | Admitting: Gastroenterology

## 2013-01-27 VITALS — BP 134/60 | HR 74 | Ht 60.0 in | Wt 116.5 lb

## 2013-01-27 DIAGNOSIS — R109 Unspecified abdominal pain: Secondary | ICD-10-CM

## 2013-01-27 DIAGNOSIS — K5732 Diverticulitis of large intestine without perforation or abscess without bleeding: Secondary | ICD-10-CM

## 2013-01-27 MED ORDER — HYOSCYAMINE SULFATE 0.125 MG SL SUBL: 0.1250 mg | SUBLINGUAL_TABLET | SUBLINGUAL | Status: DC | PRN

## 2013-01-27 NOTE — Progress Notes (Signed)
History of Present Illness:  This is a very nice 72 year old Caucasian female in good health who underwent screening colonoscopy last fall.  His exam was normal except for diverticulosis.  She developed acute diverticulitis approximately 3 weeks ago with lower abdominal pain and low-grade fever.  This is confirmed by CT scan of the abdomen.  She did well on Cipro and amoxicillin but subsequently one week later developed upper abdominal pain radiating to her back with mild nausea.  She was again seen in the emergency room and had a negative cardiovascular workup, but chest x-ray suggested mild emphysema.  Patient has not smoked in 10 years, and denies any cardiopulmonary symptoms.  There is no history of known gallbladder liver disease, the patient denies abuse of alcohol, NSAID, or cigarettes at this time.  She ordinarily is on high fiber diet and takes daily Metamucil.  Her recent episode of diverticulitis was precipitated by eating a large amount of nuts.  She currently is on a bland soft diet, and other multiple medications are listed and reviewed.  She feels well at this time has no abdominal pain or systemic complaints.  I have reviewed this patient's present history, medical and surgical past history, allergies and medications.     ROS:   All systems were reviewed and are negative unless otherwise stated in the HPI.    Physical Exam: Blood pressure 134/60, pulse 74 and regular, and weight 118 with a BMI of 22.75.  97% oxygen saturation General well developed well nourished patient in no acute distress, appearing their stated age Eyes PERRLA, no icterus, fundoscopic exam per opthamologist Skin no lesions noted Neck supple, no adenopathy, no thyroid enlargement, no tenderness Chest clear to percussion and auscultation Heart no significant murmurs, gallops or rubs noted Abdomen no hepatosplenomegaly masses or tenderness, BS normal.  No masses or tenderness to deep palpation.  Bowel sounds are  normal.  Extremities no acute joint lesions, edema, phlebitis or evidence of cellulitis. Neurologic patient oriented x 3, cranial nerves intact, no focal neurologic deficits noted. Psychological mental status normal and normal affect.  Assessment and plan: Recent diverticulitis which has responded to antibiotic therapy.  Her subsequent upper abdominal pain is unclear etiology, rule out cholelithiasis.  I have set her up for ultrasound of her gallbladder and upper abdomen to be complete.  At this time she can resume a high fiber diet with daily Metamucil, and I've asked her to avoid nutty foods and substances.  She was our patient education video on diverticulosis and its management.  I have given her Levsin 0.125 mg to use sublingually when necessary for abdominal spasms.  She has not need followup colonoscopy at this time.  Have asked to check with her primary care physician per diagnosis ordered chest x-ray of mild emphysema.  CT scan of the x-ray is reviewed personally and with the patient.  Please copy her primary care physician, referring physician, and pertinent subspecialists.  No diagnosis found.

## 2013-01-27 NOTE — Patient Instructions (Addendum)
Information on diverticulosis and high fiber diet is below.  Please purchase Metamucil over the counter. Take as directed.  Levsin was sent to your pharmacy, please take as directed.  You have been scheduled for an abdominal ultrasound at [facility] (1st floor of hospital) on 01-30-2013 at 10 AM. Please arrive 15 minutes prior to your appointment for registration. Make certain not to have anything to eat or drink 6 hours prior to your appointment. Should you need to reschedule your appointment, please contact radiology at [phone]. This test typically takes about 30 minutes to perform. _________________________________________________________________________________________________________________________________________________________________________  Diverticulosis Diverticulosis is a common condition that develops when small pouches (diverticula) form in the wall of the colon. The risk of diverticulosis increases with age. It happens more often in people who eat a low-fiber diet. Most individuals with diverticulosis have no symptoms. Those individuals with symptoms usually experience abdominal pain, constipation, or loose stools (diarrhea). HOME CARE INSTRUCTIONS   Increase the amount of fiber in your diet as directed by your caregiver or dietician. This may reduce symptoms of diverticulosis.  Your caregiver may recommend taking a dietary fiber supplement.  Drink at least 6 to 8 glasses of water each day to prevent constipation.  Try not to strain when you have a bowel movement.  Your caregiver may recommend avoiding nuts and seeds to prevent complications, although this is still an uncertain benefit.  Only take over-the-counter or prescription medicines for pain, discomfort, or fever as directed by your caregiver. FOODS WITH HIGH FIBER CONTENT INCLUDE:  Fruits. Apple, peach, pear, tangerine, raisins, prunes.  Vegetables. Brussels sprouts, asparagus, broccoli, cabbage,  carrot, cauliflower, romaine lettuce, spinach, summer squash, tomato, winter squash, zucchini.  Starchy Vegetables. Baked beans, kidney beans, lima beans, split peas, lentils, potatoes (with skin).  Grains. Whole wheat bread, brown rice, bran flake cereal, plain oatmeal, white rice, shredded wheat, bran muffins. SEEK IMMEDIATE MEDICAL CARE IF:   You develop increasing pain or severe bloating.  You have an oral temperature above 102 F (38.9 C), not controlled by medicine.  You develop vomiting or bowel movements that are bloody or black. Document Released: 07/09/2004 Document Revised: 01/04/2012 Document Reviewed: 03/12/2010 ExitCare Patient Information 2013 ExitCare, [place]. _____________________________________________________________________________________________________  High-Fiber Diet Fiber is found in fruits, vegetables, and grains. A high-fiber diet encourages the addition of more whole grains, legumes, fruits, and vegetables in your diet. The recommended amount of fiber for adult males is 38 g per day. For adult females, it is 25 g per day. Pregnant and lactating women should get 28 g of fiber per day. If you have a digestive or bowel problem, ask your caregiver for advice before adding high-fiber foods to your diet. Eat a variety of high-fiber foods instead of only a select few type of foods.  PURPOSE  To increase stool bulk.  To make bowel movements more regular to prevent constipation.  To lower cholesterol.  To prevent overeating. WHEN IS THIS DIET USED?  It may be used if you have constipation and hemorrhoids.  It may be used if you have uncomplicated diverticulosis (intestine condition) and irritable bowel syndrome.  It may be used if you need help with weight management.  It may be used if you want to add it to your diet as a protective measure against atherosclerosis, diabetes, and cancer. SOURCES OF FIBER  Whole-grain breads and cereals.  Fruits, such as  apples, oranges, bananas, berries, prunes, and pears.  Vegetables, such as green peas, carrots, sweet potatoes, beets, broccoli, cabbage,  spinach, and artichokes.  Legumes, such split peas, soy, lentils.  Almonds. FIBER CONTENT IN FOODS Starches and Grains / Dietary Fiber (g)  Cheerios, 1 cup / 3 g  Corn Flakes cereal, 1 cup / 0.7 g  Rice crispy treat cereal, 1 cup / 0.3 g  Instant oatmeal (cooked),  cup / 2 g  Frosted wheat cereal, 1 cup / 5.1 g  Brown, long-grain rice (cooked), 1 cup / 3.5 g  White, long-grain rice (cooked), 1 cup / 0.6 g  Enriched macaroni (cooked), 1 cup / 2.5 g Legumes / Dietary Fiber (g)  Baked beans (canned, plain, or vegetarian),  cup / 5.2 g  Kidney beans (canned),  cup / 6.8 g  Pinto beans (cooked),  cup / 5.5 g Breads and Crackers / Dietary Fiber (g)  Plain or honey graham crackers, 2 squares / 0.7 g  Saltine crackers, 3 squares / 0.3 g  Plain, salted pretzels, 10 pieces / 1.8 g  Whole-wheat bread, 1 slice / 1.9 g  White bread, 1 slice / 0.7 g  Raisin bread, 1 slice / 1.2 g  Plain bagel, 3 oz / 2 g  Flour tortilla, 1 oz / 0.9 g  Corn tortilla, 1 small / 1.5 g  Hamburger or hotdog bun, 1 small / 0.9 g Fruits / Dietary Fiber (g)  Apple with skin, 1 medium / 4.4 g  Sweetened applesauce,  cup / 1.5 g  Banana,  medium / 1.5 g  Grapes, 10 grapes / 0.4 g  Orange, 1 small / 2.3 g  Raisin, 1.5 oz / 1.6 g  Melon, 1 cup / 1.4 g Vegetables / Dietary Fiber (g)  Green beans (canned),  cup / 1.3 g  Carrots (cooked),  cup / 2.3 g  Broccoli (cooked),  cup / 2.8 g  Peas (cooked),  cup / 4.4 g  Mashed potatoes,  cup / 1.6 g  Lettuce, 1 cup / 0.5 g  Corn (canned),  cup / 1.6 g  Tomato,  cup / 1.1 g Document Released: 10/12/2005 Document Revised: 04/12/2012 Document Reviewed: 01/14/2012 ExitCare Patient Information 2013 [employer]. ____________________________________________________________________________________________________________________________________                                               We are excited to introduce MyChart, a new best-in-class service that provides you online access to important information in your electronic medical record. We want to make it easier for you to view your health information - all in one secure location - when and where you need it. We expect MyChart will enhance the quality of care and service we provide.  When you register for MyChart, you can:    View your test results.    Request appointments and receive appointment reminders via email.    Request medication renewals.    View your medical history, allergies, medications and immunizations.    Communicate with your physician's office through a password-protected site.    Conveniently print information such as your medication lists.  To find out if MyChart is right for you, please talk to a member of our clinical staff today. We will gladly answer your questions about this free health and wellness tool.  If you are age 41 or older and want a member of your family to have access to your record, you must provide written consent by  completing a proxy form available at our office. Please speak to our clinical staff about guidelines regarding accounts for patients younger than age 8.  As you activate your MyChart account and need any technical assistance, please call the MyChart technical support line at [phone]) or email your question to [email or web address]. If you email your question(s), please include your name, a return phone number and the best time to reach you.  If you have non-urgent health-related questions, you can send a message to our office through MyChart at [email or web address]. If you have a medical emergency, call 911.  Thank you for using MyChart as your new health  and wellness resource!   MyChart licensed from [employer],  [id number]. Patents Pending.

## 2013-01-30 ENCOUNTER — Ambulatory Visit (HOSPITAL_COMMUNITY)
Admission: RE | Admit: 2013-01-30 | Discharge: 2013-01-30 | Disposition: A | Discharge status: Home / Self Care | Payer: Medicare Other | Source: Ambulatory Visit | Attending: Gastroenterology | Admitting: Gastroenterology

## 2013-01-30 DIAGNOSIS — K7689 Other specified diseases of liver: Secondary | ICD-10-CM | POA: Insufficient documentation

## 2013-01-30 DIAGNOSIS — R109 Unspecified abdominal pain: Secondary | ICD-10-CM | POA: Insufficient documentation

## 2013-03-27 ENCOUNTER — Telehealth: Payer: Self-pay | Admitting: Family Medicine

## 2013-03-28 NOTE — Telephone Encounter (Signed)
Pt was unable to go on trip to [place] due to diverticulosis.  She was advised by Dr. Christell Constant not to go.  Patient has travel insurance and needs form filled out.  Patient will bring form by.  She is aware that there is a fee and that it may take up to 10 days to process the paperwork.

## 2013-05-15 ENCOUNTER — Other Ambulatory Visit: Payer: Self-pay | Admitting: Family Medicine

## 2013-05-26 ENCOUNTER — Other Ambulatory Visit: Payer: Self-pay | Admitting: Family Medicine

## 2013-07-20 ENCOUNTER — Other Ambulatory Visit: Payer: Self-pay | Admitting: Family Medicine

## 2013-08-03 ENCOUNTER — Telehealth: Payer: Self-pay | Admitting: Family Medicine

## 2013-08-03 ENCOUNTER — Encounter: Payer: Self-pay | Admitting: Family Medicine

## 2013-08-03 ENCOUNTER — Ambulatory Visit (INDEPENDENT_AMBULATORY_CARE_PROVIDER_SITE_OTHER): Payer: Medicare Other | Admitting: Family Medicine

## 2013-08-03 ENCOUNTER — Encounter (INDEPENDENT_AMBULATORY_CARE_PROVIDER_SITE_OTHER): Payer: Self-pay

## 2013-08-03 VITALS — BP 111/60 | HR 82 | Temp 98.5°F | Ht 60.0 in | Wt 121.0 lb

## 2013-08-03 DIAGNOSIS — K5732 Diverticulitis of large intestine without perforation or abscess without bleeding: Secondary | ICD-10-CM

## 2013-08-03 MED ORDER — SULFAMETHOXAZOLE-TRIMETHOPRIM 800-160 MG PO TABS: 1.0000 | ORAL_TABLET | Freq: Two times a day (BID) | ORAL | Status: DC

## 2013-08-03 MED ORDER — METRONIDAZOLE 500 MG PO TABS: 500.0000 mg | ORAL_TABLET | Freq: Two times a day (BID) | ORAL | Status: DC

## 2013-08-03 NOTE — Patient Instructions (Signed)
Diverticulitis °A diverticulum is a small pouch or sac on the colon. Diverticulosis is the presence of these diverticula on the colon. Diverticulitis is the irritation (inflammation) or infection of diverticula. °CAUSES  °The colon and its diverticula contain bacteria. If food particles block the tiny opening to a diverticulum, the bacteria inside can grow and cause an increase in pressure. This leads to infection and inflammation and is called diverticulitis. °SYMPTOMS  °· Abdominal pain and tenderness. Usually, the pain is located on the left side of your abdomen. However, it could be located elsewhere. °· Fever. °· Bloating. °· Feeling sick to your stomach (nausea). °· Throwing up (vomiting). °· Abnormal stools. °DIAGNOSIS  °Your caregiver will take a history and perform a physical exam. Since many things can cause abdominal pain, other tests may be necessary. Tests may include: °· Blood tests. °· Urine tests. °· X-ray of the abdomen. °· CT scan of the abdomen. °Sometimes, surgery is needed to determine if diverticulitis or other conditions are causing your symptoms. °TREATMENT  °Most of the time, you can be treated without surgery. Treatment includes: °· Resting the bowels by only having liquids for a few days. As you improve, you will need to eat a low-fiber diet. °· Intravenous (IV) fluids if you are losing body fluids (dehydrated). °· Antibiotic medicines that treat infections may be given. °· Pain and nausea medicine, if needed. °· Surgery if the inflamed diverticulum has burst. °HOME CARE INSTRUCTIONS  °· Try a clear liquid diet (broth, tea, or water for as long as directed by your caregiver). You may then gradually begin a low-fiber diet as tolerated.  °A low-fiber diet is a diet with less than 10 grams of fiber. Choose the foods below to reduce fiber in the diet: °· White breads, cereals, rice, and pasta. °· Cooked fruits and vegetables or soft fresh fruits and vegetables without the skin. °· Ground or  well-cooked tender beef, ham, veal, lamb, pork, or poultry. °· Eggs and seafood. °· After your diverticulitis symptoms have improved, your caregiver may put you on a high-fiber diet. A high-fiber diet includes 14 grams of fiber for every 1000 calories consumed. For a standard 2000 calorie diet, you would need 28 grams of fiber. Follow these diet guidelines to help you increase the fiber in your diet. It is important to slowly increase the amount fiber in your diet to avoid gas, constipation, and bloating. °· Choose whole-grain breads, cereals, pasta, and brown rice. °· Choose fresh fruits and vegetables with the skin on. Do not overcook vegetables because the more vegetables are cooked, the more fiber is lost. °· Choose more nuts, seeds, legumes, dried peas, beans, and lentils. °· Look for food products that have greater than 3 grams of fiber per serving on the Nutrition Facts label. °· Take all medicine as directed by your caregiver. °· If your caregiver has given you a follow-up appointment, it is very important that you go. Not going could result in lasting (chronic) or permanent injury, pain, and disability. If there is any problem keeping the appointment, call to reschedule. °SEEK MEDICAL CARE IF:  °· Your pain does not improve. °· You have a hard time advancing your diet beyond clear liquids. °· Your bowel movements do not return to normal. °SEEK IMMEDIATE MEDICAL CARE IF:  °· Your pain becomes worse. °· You have an oral temperature above 102° F (38.9° C), not controlled by medicine. °· You have repeated vomiting. °· You have bloody or black, tarry stools. °·   Symptoms that brought you to your caregiver become worse or are not getting better. °MAKE SURE YOU:  °· Understand these instructions. °· Will watch your condition. °· Will get help right away if you are not doing well or get worse. °Document Released: 07/22/2005 Document Revised: 01/04/2012 Document Reviewed: 11/17/2010 °ExitCare® Patient Information  ©2014 [employer]. ° °

## 2013-08-03 NOTE — Progress Notes (Signed)
  Subjective:    Patient ID: Dorothy Pitts, female    DOB: Dec 24, 1940, 72 y.o.   MRN: [id number]  HPI This 72 y.o. female presents for evaluation of left lower quadrant abdominal discomfort for a day. She feels like she did when she had diverticuliitis.   Review of Systems C/o abdominal pain in discomfort No chest pain, SOB, HA, dizziness, vision change, N/V, diarrhea, constipation, dysuria, urinary urgency or frequency, myalgias, arthralgias or rash.     Objective:   Physical Exam Vital signs noted  Well developed well nourished female.  HEENT - Head atraumatic Normocephalic                Eyes - PERRLA, Conjuctiva - clear Sclera- Clear EOMI                Ears - EAC's Wnl TM's Wnl Gross Hearing WNL                Nose - Nares patent                 Throat - oropharanx wnl Respiratory - Lungs CTA bilateral Cardiac - RRR S1 and S2 without murmur. Abd - TTP LLQ        Assessment & Plan:  Diverticulitis of colon (without mention of hemorrhage) - Plan: metroNIDAZOLE (FLAGYL) 500 MG tablet, sulfamethoxazole-trimethoprim (BACTRIM DS,SEPTRA DS) 800-160 MG per tablet

## 2013-08-03 NOTE — Telephone Encounter (Signed)
Pt needs to be seen for abd pain appt scheduled

## 2013-08-04 ENCOUNTER — Ambulatory Visit: Payer: Medicare Other | Admitting: Family Medicine

## 2013-08-26 ENCOUNTER — Other Ambulatory Visit: Payer: Self-pay | Admitting: Family Medicine

## 2013-08-29 NOTE — Telephone Encounter (Signed)
Last seen 01/12/13  DWM  Requesting a 90 day supply

## 2013-09-18 ENCOUNTER — Other Ambulatory Visit: Payer: Self-pay | Admitting: Family Medicine

## 2013-09-19 ENCOUNTER — Telehealth: Payer: Self-pay | Admitting: Family Medicine

## 2013-09-19 NOTE — Telephone Encounter (Signed)
Please call daily with phone number for Dr. Emerson Monte, she is a psychiatrist in [place] and probably the best thing to do is for her or her son to call when she given information and make the appointment for him to be seen

## 2013-09-25 NOTE — Telephone Encounter (Signed)
This note regarding Dorothy Pitts (her son) Dr Ann Maki Mckinney's number, address, etc... Was given to mom.

## 2013-10-07 ENCOUNTER — Emergency Department (HOSPITAL_COMMUNITY): Payer: Medicare Other

## 2013-10-07 ENCOUNTER — Inpatient Hospital Stay (HOSPITAL_COMMUNITY)
Admission: EM | Admit: 2013-10-07 | Discharge: 2013-10-09 | DRG: 392 | Disposition: A | Discharge status: Home / Self Care | Payer: Medicare Other | Attending: Internal Medicine | Admitting: Internal Medicine

## 2013-10-07 ENCOUNTER — Encounter (HOSPITAL_COMMUNITY): Payer: Self-pay | Admitting: Emergency Medicine

## 2013-10-07 DIAGNOSIS — R1032 Left lower quadrant pain: Secondary | ICD-10-CM

## 2013-10-07 DIAGNOSIS — K219 Gastro-esophageal reflux disease without esophagitis: Secondary | ICD-10-CM | POA: Diagnosis present

## 2013-10-07 DIAGNOSIS — K5730 Diverticulosis of large intestine without perforation or abscess without bleeding: Secondary | ICD-10-CM

## 2013-10-07 DIAGNOSIS — K631 Perforation of intestine (nontraumatic): Secondary | ICD-10-CM

## 2013-10-07 DIAGNOSIS — Z87891 Personal history of nicotine dependence: Secondary | ICD-10-CM

## 2013-10-07 DIAGNOSIS — N390 Urinary tract infection, site not specified: Secondary | ICD-10-CM

## 2013-10-07 DIAGNOSIS — E785 Hyperlipidemia, unspecified: Secondary | ICD-10-CM | POA: Diagnosis present

## 2013-10-07 DIAGNOSIS — R112 Nausea with vomiting, unspecified: Secondary | ICD-10-CM

## 2013-10-07 DIAGNOSIS — E876 Hypokalemia: Secondary | ICD-10-CM

## 2013-10-07 DIAGNOSIS — D649 Anemia, unspecified: Secondary | ICD-10-CM

## 2013-10-07 DIAGNOSIS — K5732 Diverticulitis of large intestine without perforation or abscess without bleeding: Principal | ICD-10-CM

## 2013-10-07 DIAGNOSIS — I10 Essential (primary) hypertension: Secondary | ICD-10-CM

## 2013-10-07 DIAGNOSIS — Z8249 Family history of ischemic heart disease and other diseases of the circulatory system: Secondary | ICD-10-CM

## 2013-10-07 DIAGNOSIS — R197 Diarrhea, unspecified: Secondary | ICD-10-CM | POA: Insufficient documentation

## 2013-10-07 DIAGNOSIS — R52 Pain, unspecified: Secondary | ICD-10-CM

## 2013-10-07 DIAGNOSIS — K589 Irritable bowel syndrome without diarrhea: Secondary | ICD-10-CM

## 2013-10-07 DIAGNOSIS — K5792 Diverticulitis of intestine, part unspecified, without perforation or abscess without bleeding: Secondary | ICD-10-CM

## 2013-10-07 DIAGNOSIS — K581 Irritable bowel syndrome with constipation: Secondary | ICD-10-CM | POA: Diagnosis present

## 2013-10-07 DIAGNOSIS — E861 Hypovolemia: Secondary | ICD-10-CM | POA: Diagnosis present

## 2013-10-07 DIAGNOSIS — E871 Hypo-osmolality and hyponatremia: Secondary | ICD-10-CM | POA: Diagnosis present

## 2013-10-07 HISTORY — DX: Urinary tract infection, site not specified: N39.0

## 2013-10-07 HISTORY — DX: Diverticulitis of intestine, part unspecified, without perforation or abscess without bleeding: K57.92

## 2013-10-07 LAB — URINALYSIS, ROUTINE W REFLEX MICROSCOPIC
Bilirubin Urine: NEGATIVE
Ketones, ur: 15 mg/dL — AB
Nitrite: NEGATIVE
Protein, ur: NEGATIVE mg/dL
Specific Gravity, Urine: <1.005 — ABNORMAL LOW (ref 1.005–1.030)
Urobilinogen, UA: 0.2 mg/dL (ref 0.0–1.0)

## 2013-10-07 LAB — COMPREHENSIVE METABOLIC PANEL
ALT: 31 U/L (ref 0–35)
Albumin: 3.5 g/dL (ref 3.5–5.2)
Calcium: 9.4 mg/dL (ref 8.4–10.5)
GFR calc Af Amer: >90 mL/min (ref >90)
Glucose, Bld: 110 mg/dL — ABNORMAL HIGH (ref 70–99)
Sodium: 131 mEq/L — ABNORMAL LOW (ref 135–145)
Total Protein: 7.1 g/dL (ref 6.0–8.3)

## 2013-10-07 LAB — CBC WITH DIFFERENTIAL/PLATELET
Basophils Absolute: 0 10*3/uL (ref 0.0–0.1)
Basophils Relative: 0 % (ref 0–1)
Eosinophils Absolute: 0 10*3/uL (ref 0.0–0.7)
Eosinophils Relative: 0 % (ref 0–5)
Lymphs Abs: 0.7 10*3/uL (ref 0.7–4.0)
MCH: 32.4 pg (ref 26.0–34.0)
MCHC: 34.1 g/dL (ref 30.0–36.0)
MCV: 95.2 fL (ref 78.0–100.0)
Platelets: 192 10*3/uL (ref 150–400)
RDW: 12.6 % (ref 11.5–15.5)

## 2013-10-07 LAB — URINE MICROSCOPIC-ADD ON

## 2013-10-07 LAB — TROPONIN I: Troponin I: <0.3 ng/mL (ref <0.30)

## 2013-10-07 MED ORDER — PANTOPRAZOLE SODIUM 40 MG IV SOLR
40.0000 mg | Freq: Once | INTRAVENOUS | Status: AC
  Administered 2013-10-07: 40 mg via INTRAVENOUS
  Filled 2013-10-07: qty 40

## 2013-10-07 MED ORDER — ONDANSETRON HCL 4 MG/2ML IJ SOLN: 4.0000 mg | Freq: Three times a day (TID) | INTRAMUSCULAR | Status: DC | PRN

## 2013-10-07 MED ORDER — IOHEXOL 300 MG/ML  SOLN
50.0000 mL | Freq: Once | INTRAMUSCULAR | Status: AC | PRN
  Administered 2013-10-07: 50 mL via ORAL

## 2013-10-07 MED ORDER — SPIRONOLACTONE 25 MG PO TABS
50.0000 mg | ORAL_TABLET | Freq: Every morning | ORAL | Status: DC
  Administered 2013-10-08: 50 mg via ORAL
  Filled 2013-10-07 (×2): qty 2

## 2013-10-07 MED ORDER — GI COCKTAIL ~~LOC~~
30.0000 mL | Freq: Once | ORAL | Status: AC
  Administered 2013-10-07: 30 mL via ORAL
  Filled 2013-10-07: qty 30

## 2013-10-07 MED ORDER — SODIUM CHLORIDE 0.9 % IV BOLUS (SEPSIS)
1000.0000 mL | Freq: Once | INTRAVENOUS | Status: AC
  Administered 2013-10-07: 1000 mL via INTRAVENOUS

## 2013-10-07 MED ORDER — METRONIDAZOLE IN NACL 5-0.79 MG/ML-% IV SOLN
500.0000 mg | Freq: Three times a day (TID) | INTRAVENOUS | Status: DC
  Administered 2013-10-08 – 2013-10-09 (×5): 500 mg via INTRAVENOUS
  Filled 2013-10-07 (×8): qty 100

## 2013-10-07 MED ORDER — ZOLPIDEM TARTRATE 5 MG PO TABS
2.5000 mg | ORAL_TABLET | Freq: Every evening | ORAL | Status: DC | PRN
  Administered 2013-10-07 – 2013-10-08 (×2): 2.5 mg via ORAL
  Filled 2013-10-07 (×2): qty 1

## 2013-10-07 MED ORDER — ONDANSETRON HCL 4 MG/2ML IJ SOLN
4.0000 mg | Freq: Once | INTRAMUSCULAR | Status: AC
  Administered 2013-10-07: 4 mg via INTRAVENOUS
  Filled 2013-10-07: qty 2

## 2013-10-07 MED ORDER — ONDANSETRON HCL 4 MG/2ML IJ SOLN
4.0000 mg | Freq: Four times a day (QID) | INTRAMUSCULAR | Status: DC | PRN
  Administered 2013-10-08: 4 mg via INTRAVENOUS
  Filled 2013-10-07: qty 2

## 2013-10-07 MED ORDER — IOHEXOL 300 MG/ML  SOLN
100.0000 mL | Freq: Once | INTRAMUSCULAR | Status: AC | PRN
  Administered 2013-10-07: 100 mL via INTRAVENOUS

## 2013-10-07 MED ORDER — METRONIDAZOLE IN NACL 5-0.79 MG/ML-% IV SOLN
500.0000 mg | Freq: Once | INTRAVENOUS | Status: AC
  Administered 2013-10-07: 500 mg via INTRAVENOUS
  Filled 2013-10-07: qty 100

## 2013-10-07 MED ORDER — ONDANSETRON HCL 4 MG PO TABS
4.0000 mg | ORAL_TABLET | Freq: Four times a day (QID) | ORAL | Status: DC | PRN
  Administered 2013-10-09: 4 mg via ORAL
  Filled 2013-10-07: qty 1

## 2013-10-07 MED ORDER — ENALAPRIL MALEATE 5 MG PO TABS
5.0000 mg | ORAL_TABLET | Freq: Every morning | ORAL | Status: DC
Start: 2013-10-08 — End: 2013-10-09
  Administered 2013-10-08 – 2013-10-09 (×2): 5 mg via ORAL
  Filled 2013-10-07 (×2): qty 1

## 2013-10-07 MED ORDER — HYDROMORPHONE HCL PF 1 MG/ML IJ SOLN: 0.5000 mg | INTRAMUSCULAR | Status: DC | PRN

## 2013-10-07 MED ORDER — SODIUM CHLORIDE 0.9 % IV SOLN: INTRAVENOUS | Status: DC

## 2013-10-07 MED ORDER — POTASSIUM CHLORIDE IN NACL 20-0.9 MEQ/L-% IV SOLN
INTRAVENOUS | Status: AC
  Administered 2013-10-07: 23:00:00 via INTRAVENOUS

## 2013-10-07 MED ORDER — RALOXIFENE HCL 60 MG PO TABS
60.0000 mg | ORAL_TABLET | Freq: Every morning | ORAL | Status: DC
  Administered 2013-10-08 – 2013-10-09 (×2): 60 mg via ORAL
  Filled 2013-10-07 (×2): qty 1

## 2013-10-07 MED ORDER — PIPERACILLIN-TAZOBACTAM 3.375 G IVPB
3.3750 g | Freq: Once | INTRAVENOUS | Status: AC
  Administered 2013-10-07: 3.375 g via INTRAVENOUS
  Filled 2013-10-07: qty 50

## 2013-10-07 MED ORDER — MORPHINE SULFATE 4 MG/ML IJ SOLN
4.0000 mg | Freq: Once | INTRAMUSCULAR | Status: AC
  Administered 2013-10-07: 4 mg via INTRAVENOUS
  Filled 2013-10-07: qty 1

## 2013-10-07 MED ORDER — HYDROCODONE-ACETAMINOPHEN 5-325 MG PO TABS: 1.0000 | ORAL_TABLET | ORAL | Status: DC | PRN

## 2013-10-07 MED ORDER — METRONIDAZOLE IN NACL 5-0.79 MG/ML-% IV SOLN
INTRAVENOUS | Status: AC
  Filled 2013-10-07: qty 100

## 2013-10-07 MED ORDER — SULFAMETHOXAZOLE-TMP DS 800-160 MG PO TABS
1.0000 | ORAL_TABLET | Freq: Once | ORAL | Status: AC
  Administered 2013-10-07: 1 via ORAL
  Filled 2013-10-07: qty 1

## 2013-10-07 MED ORDER — SODIUM CHLORIDE 0.9 % IV SOLN
INTRAVENOUS | Status: DC
  Administered 2013-10-07: 22:00:00 via INTRAVENOUS

## 2013-10-07 NOTE — ED Notes (Signed)
Pt reports having diarrhea, some vomiting & chills that started on Friday. Pt states a history of diverticulitis & when she called her PCP today was advised to go to the ER.

## 2013-10-07 NOTE — H&P (Signed)
PCP:   Rudi Heap, MD   Chief Complaint:  abd pain  HPI: 72 yo female with 2 days of n/v and llq abd pain along with chills.  No diarrhea.  Pt has had 2 prior episodes of diverticulitis this year.  Ct show again, mild diverticulitis with microperforation.  Pt feels better with pain meds and zofran in ED.  Able to eat and is hungry.  Appears very well considering her ct results.  No complaints at this time.    Review of Systems:  Positive and negative as per HPI otherwise all other systems are negative  Past Medical History: Past Medical History  Diagnosis Date  . Hyperlipidemia   . Hypertension   . IBS (irritable bowel syndrome)   . GERD (gastroesophageal reflux disease)   . Cataract   . Hemorrhoids   . Diverticulosis of colon (without mention of hemorrhage)    Past Surgical History  Procedure Laterality Date  . Appendectomy    . Partial hysterectomy    . Bilateral oophorectomy    . Hemorrhoid surgery    . Breast lumpectomy Left   . Tonsillectomy    . Cataract extraction      Medications: Prior to Admission medications   Medication Sig Start Date End Date Taking? Authorizing Provider  Ascorbic Acid (VITAMIN C) 1000 MG tablet Take 1,000 mg by mouth daily.    Yes Historical Provider, MD  Cholecalciferol (VITAMIN D3) 2000 UNITS TABS Take 1 tablet by mouth daily.   Yes Historical Provider, MD  docusate sodium (COLACE) 100 MG capsule Take 200 mg by mouth at bedtime.   Yes Historical Provider, MD  enalapril (VASOTEC) 5 MG tablet Take 5 mg by mouth every morning.   Yes Historical Provider, MD  Multiple Vitamin (MULTIVITAMIN) tablet Take 1 tablet by mouth daily.   Yes Historical Provider, MD  raloxifene (EVISTA) 60 MG tablet Take 60 mg by mouth every morning.   Yes Historical Provider, MD  ranitidine (ZANTAC) 150 MG tablet Take 150 mg by mouth 2 (two) times daily.   Yes Historical Provider, MD  spironolactone (ALDACTONE) 50 MG tablet Take 50 mg by mouth every morning.   Yes  Historical Provider, MD  zolpidem (AMBIEN) 5 MG tablet Take 2.5 mg by mouth at bedtime as needed for sleep.     Historical Provider, MD    Allergies:   Allergies  Allergen Reactions  . Actonel [Risedronate Sodium] Other (See Comments)    Reaction=Heart burn  . Fosamax [Alendronate Sodium] Other (See Comments)    Reaction=Heart burn  . Lovaza [Omega-3-Acid Ethyl Esters] Other (See Comments)    Reaction=skin bruise   . Nsaids Swelling    Reaction=Swelling of face  . Cephalexin Itching and Rash  . Ciprofloxacin Itching and Rash    Social History:  reports that she quit smoking about 10 years ago. Her smoking use included Cigarettes. She smoked 0.00 packs per day. She has never used smokeless tobacco. She reports that she drinks about 3.0 ounces of alcohol per week. She reports that she does not use illicit drugs.  Family History: Family History  Problem Relation Age of Onset  . Colon cancer Neg Hx   . Hypertension Mother   . Heart attack Mother   . Parkinson's disease Father   . Heart disease Brother     Physical Exam: Filed Vitals:   10/07/13 1713 10/07/13 2014 10/07/13 2015 10/07/13 2016  BP: 135/78 124/53 115/49 127/60  Pulse: 102 96 99 100  Temp: 98.2  F (36.8 C)     TempSrc: Oral Oral    Resp: 18     Height: 5' 0.5" (1.537 m)     Weight: 53.524 kg (118 lb)     SpO2: 100%      General appearance: alert, cooperative and no distress Head: Normocephalic, without obvious abnormality, atraumatic Eyes: negative Nose: Nares normal. Septum midline. Mucosa normal. No drainage or sinus tenderness. Neck: no JVD and supple, symmetrical, trachea midline Lungs: clear to auscultation bilaterally Heart: regular rate and rhythm, S1, S2 normal, no murmur, click, rub or gallop Abdomen: soft, non-tender; bowel sounds normal; no masses,  no organomegaly  Mild ttp llq Extremities: extremities normal, atraumatic, no cyanosis or edema Pulses: 2+ and symmetric Skin: Skin color,  texture, turgor normal. No rashes or lesions Neurologic: Grossly normal    Labs on Admission:   Recent Labs  10/07/13 1743  NA 131*  K 3.3*  CL 93*  [name] 25  GLUCOSE 110*  BUN 12  CREATININE 0.69  CALCIUM 9.4    Recent Labs  10/07/13 1743  AST 37  ALT 31  ALKPHOS 56  BILITOT 0.4  PROT 7.1  ALBUMIN 3.5    Recent Labs  10/07/13 1743  LIPASE 24    Recent Labs  10/07/13 1743  WBC 5.8  NEUTROABS 4.9  HGB 14.2  HCT 41.7  MCV 95.2  PLT 192    Recent Labs  10/07/13 2016  TROPONINI <0.30   Radiological Exams on Admission: Ct Abdomen Pelvis W Contrast  10/07/2013   CLINICAL DATA:  Lower abdominal pain. Vomiting. Diarrhea. Chills. Previous history of diverticulitis.  EXAM: CT ABDOMEN AND PELVIS WITH CONTRAST  TECHNIQUE: Multidetector CT imaging of the abdomen and pelvis was performed using the standard protocol following bolus administration of intravenous contrast.  CONTRAST:  OMNIPAQUE IOHEXOL 300 MG/ML  SOLN  COMPARISON:  01/12/13  FINDINGS: Tiny left hepatic lobe cyst remains stable. No liver masses are identified. The gallbladder, pancreas, spleen, adrenal glands, and kidneys are normal in appearance. No soft tissue masses or lymphadenopathy identified within the abdomen or pelvis.  Prior hysterectomy noted. Adnexal regions are unremarkable. Diverticulosis is seen mainly involving the descending and sigmoid portions of the colon. Mild colonic wall thickening and pericolonic inflammatory changes seen involving the descending colon with a few tiny extraluminal air bubbles noted. This is consistent with mild diverticulitis with microperforation. There is no evidence of abscess or free fluid. No evidence of bowel obstruction.  IMPRESSION: Mild descending colon diverticulitis with microperforation. No evidence of abscess or bowel obstruction.   Electronically Signed   By: Myles Rosenthal M.D.   On: 10/07/2013 19:59    Assessment/Plan  72 yo female with acute  diverticulitis with microperforation, 3rd bout of diverticulitis this year  Principal Problem:   Acute diverticulitis-  abd exam is benign.  Pt looks good.  Place on iv flagyl and zosyn.  Obtain gen surgery consult for the need for possible partial colectomy in the near future. Full liq diet.  Iv zofran and oral pain meds as needed.  ivf overnight.  Active Problems:   Diverticulosis of colon (without mention of hemorrhage)  As above   Hypertension  Cont home meds   IBS (irritable bowel syndrome)   microperforation of colon   N&V (nausea and vomiting)   Abdominal pain, acute, left lower quadrant   UTI (lower urinary tract infection)  Should be covered with zosyn      Dorothy Pitts A 10/07/2013, 9:08 PM

## 2013-10-07 NOTE — ED Provider Notes (Signed)
CSN: [id number]     Arrival date & time 10/07/13  1711 History   First MD Initiated Contact with Patient 10/07/13 1719     Chief Complaint  Patient presents with  . Emesis  . Diarrhea  . Chills   (Consider location/radiation/quality/duration/timing/severity/associated sxs/prior Treatment) HPI Comments: Patient presents with a two-day history of lower abdominal pain with chills and nausea. She had 4-5 episodes of vomiting yesterday but none today. She describes emesis is yellow and "bilious" without blood. She had frequent loose stools yesterday but none today. She's crampy pain in her left lower quadrant that is similar to her previous episodes of diverticulitis. She did not check her temperature. Denies any urinary or vaginal symptoms. Denies any travel, sick contacts or recent antibiotic use. No dizziness or lightheadedness.  No chest pain or SOB.  The history is provided by the patient.    Past Medical History  Diagnosis Date  . Hyperlipidemia   . Hypertension   . IBS (irritable bowel syndrome)   . GERD (gastroesophageal reflux disease)   . Cataract   . Hemorrhoids   . Diverticulosis of colon (without mention of hemorrhage)    Past Surgical History  Procedure Laterality Date  . Appendectomy    . Partial hysterectomy    . Bilateral oophorectomy    . Hemorrhoid surgery    . Breast lumpectomy Left   . Tonsillectomy    . Cataract extraction     Family History  Problem Relation Age of Onset  . Colon cancer Neg Hx   . Hypertension Mother   . Heart attack Mother   . Parkinson's disease Father   . Heart disease Brother    History  Substance Use Topics  . Smoking status: Former Smoker    Types: Cigarettes    Quit date: 10/26/2002  . Smokeless tobacco: Never Used     Comment: quit smoking 10 years ago  . Alcohol Use: 3.0 oz/week    5 Glasses of wine per week     Comment: wine   OB History   Grav Para Term Preterm Abortions TAB SAB Ect Mult Living                  Review of Systems  Constitutional: Positive for chills, activity change, appetite change and fatigue. Negative for fever.  Respiratory: Negative for cough, chest tightness and shortness of breath.   Cardiovascular: Negative for chest pain.  Gastrointestinal: Positive for nausea, vomiting, abdominal pain and diarrhea. Negative for blood in stool.  Genitourinary: Negative for dysuria, hematuria, vaginal bleeding and vaginal discharge.  Musculoskeletal: Negative for arthralgias, back pain and myalgias.  Skin: Negative for rash.  Neurological: Positive for weakness. Negative for dizziness, light-headedness and numbness.  A complete 10 system review of systems was obtained and all systems are negative except as noted in the HPI and PMH.    Allergies  Actonel; Fosamax; Lovaza; Nsaids; Cephalexin; and Ciprofloxacin  Home Medications   Current Outpatient Rx  Name  Route  Sig  Dispense  Refill  . Ascorbic Acid (VITAMIN C) 1000 MG tablet   Oral   Take 1,000 mg by mouth daily.          . Cholecalciferol (VITAMIN D3) 2000 UNITS TABS   Oral   Take 1 tablet by mouth daily.         [name] Kitchen docusate sodium (COLACE) 100 MG capsule   Oral   Take 200 mg by mouth at bedtime.         [name] Kitchen  enalapril (VASOTEC) 5 MG tablet   Oral   Take 5 mg by mouth every morning.         . Multiple Vitamin (MULTIVITAMIN) tablet   Oral   Take 1 tablet by mouth daily.         . raloxifene (EVISTA) 60 MG tablet   Oral   Take 60 mg by mouth every morning.         . ranitidine (ZANTAC) 150 MG tablet   Oral   Take 150 mg by mouth 2 (two) times daily.         [name] Kitchen spironolactone (ALDACTONE) 50 MG tablet   Oral   Take 50 mg by mouth every morning.         . zolpidem (AMBIEN) 5 MG tablet   Oral   Take 2.5 mg by mouth at bedtime as needed for sleep.           BP 127/60  Pulse 100  Temp(Src) 98.2 F (36.8 C) (Oral)  Resp 18  Ht 5' 0.5" (1.537 m)  Wt 118 lb (53.524 kg)  BMI 22.66 kg/m2  SpO2  100% Physical Exam  Constitutional: She is oriented to person, place, and time. She appears well-developed and well-nourished. No distress.  Impeccable makeup  HENT:  Head: Normocephalic and atraumatic.  Mouth/Throat: Oropharynx is clear and moist. No oropharyngeal exudate.  Eyes: Conjunctivae and EOM are normal. Pupils are equal, round, and reactive to light.  Neck: Neck supple.  Cardiovascular: Normal rate, regular rhythm and normal heart sounds.   No murmur heard. Pulmonary/Chest: Effort normal. No respiratory distress.  Abdominal: Soft. There is tenderness. There is no rebound and no guarding.  TTP LLQ and suprapubic without guarding or rebound  Musculoskeletal: Normal range of motion. She exhibits no edema and no tenderness.  No CVAT  Neurological: She is alert and oriented to person, place, and time. No cranial nerve deficit. She exhibits normal muscle tone. Coordination normal.  Skin: Skin is warm.    ED Course  Procedures (including critical care time) Labs Review Labs Reviewed  CBC WITH DIFFERENTIAL - Abnormal; Notable for the following:    Neutrophils Relative % 85 (*)    Lymphocytes Relative 11 (*)    All other components within normal limits  COMPREHENSIVE METABOLIC PANEL - Abnormal; Notable for the following:    Sodium 131 (*)    Potassium 3.3 (*)    Chloride 93 (*)    Glucose, Bld 110 (*)    GFR calc non Af Amer 85 (*)    All other components within normal limits  URINALYSIS, ROUTINE W REFLEX MICROSCOPIC - Abnormal; Notable for the following:    APPearance CLOUDY (*)    Specific Gravity, Urine <1.005 (*)    Hgb urine dipstick MODERATE (*)    Ketones, ur 15 (*)    Leukocytes, UA MODERATE (*)    All other components within normal limits  URINE MICROSCOPIC-ADD ON - Abnormal; Notable for the following:    Squamous Epithelial / LPF MANY (*)    Bacteria, UA FEW (*)    All other components within normal limits  URINE CULTURE  LIPASE, BLOOD  LACTIC ACID, PLASMA   TROPONIN I   Imaging Review Ct Abdomen Pelvis W Contrast  10/07/2013   CLINICAL DATA:  Lower abdominal pain. Vomiting. Diarrhea. Chills. Previous history of diverticulitis.  EXAM: CT ABDOMEN AND PELVIS WITH CONTRAST  TECHNIQUE: Multidetector CT imaging of the abdomen and pelvis was performed using the standard protocol  following bolus administration of intravenous contrast.  CONTRAST:  OMNIPAQUE IOHEXOL 300 MG/ML  SOLN  COMPARISON:  01/12/13  FINDINGS: Tiny left hepatic lobe cyst remains stable. No liver masses are identified. The gallbladder, pancreas, spleen, adrenal glands, and kidneys are normal in appearance. No soft tissue masses or lymphadenopathy identified within the abdomen or pelvis.  Prior hysterectomy noted. Adnexal regions are unremarkable. Diverticulosis is seen mainly involving the descending and sigmoid portions of the colon. Mild colonic wall thickening and pericolonic inflammatory changes seen involving the descending colon with a few tiny extraluminal air bubbles noted. This is consistent with mild diverticulitis with microperforation. There is no evidence of abscess or free fluid. No evidence of bowel obstruction.  IMPRESSION: Mild descending colon diverticulitis with microperforation. No evidence of abscess or bowel obstruction.   Electronically Signed   By: Myles Rosenthal M.D.   On: 10/07/2013 19:59    EKG Interpretation    Date/Time:  Saturday October 07 2013 19:53:27 EST Ventricular Rate:  97 PR Interval:  150 QRS Duration: 86 QT Interval:  342 QTC Calculation: 434 R Axis:   82 Text Interpretation:  Normal sinus rhythm Septal infarct , age undetermined Abnormal ECG new septal Q waves Non-specific ST-t changes Confirmed by Manus Gunning  MD, Hayslee Casebolt ([id number]) on 10/07/2013 8:10:50 PM            MDM   1. Diverticulosis of colon (without mention of hemorrhage)   2. Hypertension   3. IBS (irritable bowel syndrome)   4. Diverticulitis large intestine   5. Urinary  tract infection    2 days of lower abdominal pain, nausea, vomiting, diarrhea, similar to previous diverticulitis.  Nontoxic appearing.  TTP LLQ without peritoneal signs.  Urinalysis shows infection. Lactate and WBC count normal.  CT scan shows the descending colon diverticulitis with microperforation. No evidence of abscess. Patient has allergies to Cipro and cephalosporins. She'll be treated with IV Flagyl and Zosyn. Given her microperforation she will need admission for observation. The antibiotic should cover her UTI as well.  BP 127/60  Pulse 100  Temp(Src) 98.2 F (36.8 C) (Oral)  Resp 18  Ht 5' 0.5" (1.537 m)  Wt 118 lb (53.524 kg)  BMI 22.66 kg/m2  SpO2 100%   Glynn Octave, MD 10/07/13 2111

## 2013-10-07 NOTE — ED Notes (Signed)
Pt states that she was not dizzy during her orthostatic pressures. Dorothy Pitts

## 2013-10-08 DIAGNOSIS — D649 Anemia, unspecified: Secondary | ICD-10-CM

## 2013-10-08 DIAGNOSIS — E876 Hypokalemia: Secondary | ICD-10-CM | POA: Diagnosis present

## 2013-10-08 HISTORY — DX: Anemia, unspecified: D64.9

## 2013-10-08 LAB — CBC
HCT: 31.1 % — ABNORMAL LOW (ref 36.0–46.0)
Hemoglobin: 10.6 g/dL — ABNORMAL LOW (ref 12.0–15.0)
MCH: 32.3 pg (ref 26.0–34.0)
MCHC: 34.1 g/dL (ref 30.0–36.0)
MCV: 94.8 fL (ref 78.0–100.0)
RBC: 3.28 MIL/uL — ABNORMAL LOW (ref 3.87–5.11)
WBC: 4.8 10*3/uL (ref 4.0–10.5)

## 2013-10-08 LAB — BASIC METABOLIC PANEL
BUN: 8 mg/dL (ref 6–23)
CO2: 25 mEq/L (ref 19–32)
Calcium: 7.8 mg/dL — ABNORMAL LOW (ref 8.4–10.5)
Chloride: 104 mEq/L (ref 96–112)
Glucose, Bld: 96 mg/dL (ref 70–99)
Sodium: 137 mEq/L (ref 135–145)

## 2013-10-08 MED ORDER — POTASSIUM CHLORIDE CRYS ER 20 MEQ PO TBCR
20.0000 meq | EXTENDED_RELEASE_TABLET | Freq: Two times a day (BID) | ORAL | Status: AC
  Administered 2013-10-08 (×2): 20 meq via ORAL
  Filled 2013-10-08 (×2): qty 1

## 2013-10-08 MED ORDER — MORPHINE SULFATE 2 MG/ML IJ SOLN
2.0000 mg | INTRAMUSCULAR | Status: DC | PRN
  Administered 2013-10-08: 2 mg via INTRAVENOUS
  Filled 2013-10-08: qty 1

## 2013-10-08 MED ORDER — POTASSIUM CHLORIDE CRYS ER 20 MEQ PO TBCR: 30.0000 meq | EXTENDED_RELEASE_TABLET | Freq: Two times a day (BID) | ORAL | Status: DC

## 2013-10-08 MED ORDER — ACETAMINOPHEN 325 MG PO TABS
650.0000 mg | ORAL_TABLET | Freq: Four times a day (QID) | ORAL | Status: DC | PRN
  Administered 2013-10-08 – 2013-10-09 (×2): 650 mg via ORAL
  Filled 2013-10-08 (×2): qty 2

## 2013-10-08 MED ORDER — PIPERACILLIN-TAZOBACTAM 3.375 G IVPB
INTRAVENOUS | Status: AC
  Filled 2013-10-08: qty 50

## 2013-10-08 MED ORDER — HYDROCODONE-ACETAMINOPHEN 5-325 MG PO TABS: 1.0000 | ORAL_TABLET | ORAL | Status: DC | PRN

## 2013-10-08 MED ORDER — PIPERACILLIN-TAZOBACTAM 3.375 G IVPB
3.3750 g | Freq: Three times a day (TID) | INTRAVENOUS | Status: DC
  Administered 2013-10-08 – 2013-10-09 (×4): 3.375 g via INTRAVENOUS
  Filled 2013-10-08 (×5): qty 50

## 2013-10-08 NOTE — Progress Notes (Signed)
ANTIBIOTIC CONSULT NOTE - INITIAL  Pharmacy Consult for Zosyn Indication: acute diverticulitis with microperforation  Allergies  Allergen Reactions  . Actonel [Risedronate Sodium] Other (See Comments)    Reaction=Heart burn  . Fosamax [Alendronate Sodium] Other (See Comments)    Reaction=Heart burn  . Lovaza [Omega-3-Acid Ethyl Esters] Other (See Comments)    Reaction=skin bruise   . Nsaids Swelling    Reaction=Swelling of face  . Cephalexin Itching and Rash  . Ciprofloxacin Itching and Rash    Patient Measurements: Height: 5' 0.5" (153.7 cm) Weight: 125 lb 4.8 oz (56.836 kg) IBW/kg (Calculated) : 46.65   Vital Signs: Temp: 99.4 F (37.4 C) (12/13 2219) Temp src: Oral (12/13 2219) BP: 109/59 mmHg (12/13 2219) Pulse Rate: 89 (12/13 2219) Intake/Output from previous day:   Intake/Output from this shift:    Labs:  Recent Labs  10/07/13 1743  WBC 5.8  HGB 14.2  PLT 192  CREATININE 0.69   Estimated Creatinine Clearance: 50.9 ml/min (by C-G formula based on Cr of 0.69). No results found for this basename: VANCOTROUGH, VANCOPEAK, VANCORANDOM, GENTTROUGH, GENTPEAK, GENTRANDOM, TOBRATROUGH, TOBRAPEAK, TOBRARND, AMIKACINPEAK, AMIKACINTROU, AMIKACIN,  in the last 72 hours   Microbiology: No results found for this or any previous visit (from the past 720 hour(s)).  Medical History: Past Medical History  Diagnosis Date  . Hyperlipidemia   . Hypertension   . IBS (irritable bowel syndrome)   . GERD (gastroesophageal reflux disease)   . Cataract   . Hemorrhoids   . Diverticulosis of colon (without mention of hemorrhage)     Medications:  Scheduled:  . enalapril  5 mg Oral q morning - 10a  . metronidazole  500 mg Intravenous Q8H  . piperacillin-tazobactam (ZOSYN)  IV  3.375 g Intravenous Once  . raloxifene  60 mg Oral q morning - 10a  . spironolactone  50 mg Oral q morning - 10a   Infusions:  . 0.9 % NaCl with KCl 20 mEq / L 75 mL/hr at 10/07/13 2239   PRN:  HYDROcodone-acetaminophen, ondansetron (ZOFRAN) IV, ondansetron, zolpidem  Assessment: 18yr female with dx of acute diverticulitis with microperforation.  Patient on metronidazole tx with Zosyn.  Goal of Therapy:  With CrCl >53ml/min, will use standard Zosyn regimen of 3.375gm IV q8h (4hr infusions).  Patient without any rx to initial dose Zosyn (has hx of rash and itching to cephalexin).  Plan:  1.  Zosyn 3.375gm IV q8h (4hr infusions) 2.  Monitor for infection, renal function  Halima Fogal, Lloyd Huger E 10/08/2013,12:51 AM

## 2013-10-08 NOTE — Progress Notes (Signed)
Utilization Review completed.  

## 2013-10-08 NOTE — Progress Notes (Signed)
[facility] PROGRESS NOTE  Dorothy Pitts ZOX:[id number] DOB: 1941/02/04 DOA: 10/07/2013 PCP: Rudi Heap, MD    Code Status: FULL CODE Family Communication: Discussed with the patient's son and brother. Disposition Plan: Discharge to home when clinically appropriate.   Consultants:  General surgery, Dr. Lovell Sheehan.  Procedures:  None  Antibiotics:  Metronidazole 10/07/2013  Zosyn 10/07/2013  HPI/Subjective: The patient feels a little better. She has less abdominal pain and nausea. She denies vomiting. She denies having a bowel movement overnight.  Objective: Filed Vitals:   10/08/13 0508  BP: 96/54  Pulse: 76  Temp: 98.7 F (37.1 C)  Resp: 18    Intake/Output Summary (Last 24 hours) at 10/08/13 1146 Last data filed at 10/08/13 0505  Gross per 24 hour  Intake  582.5 ml  Output      0 ml  Net  582.5 ml   Filed Weights   10/07/13 1713 10/07/13 2219  Weight: 53.524 kg (118 lb) 56.836 kg (125 lb 4.8 oz)    Exam:   General:  Pleasant 72 year old woman lying in bed, in no acute distress.  Cardiovascular: S1, S2, with no murmurs rubs or gallops.  Respiratory: Clear to auscultation bilaterally.  Abdomen: Positive bowel sounds, soft, moderately tender left lower quadrant. No rigidity or masses palpated.  Musculoskeletal/extremities: No pedal edema.  Data Reviewed: Basic Metabolic Panel:  Recent Labs Lab 10/07/13 1743 10/08/13 0603  NA 131* 137  K 3.3* 3.2*  CL 93* 104  [name] 25 25  GLUCOSE 110* 96  BUN 12 8  CREATININE 0.69 0.75  CALCIUM 9.4 7.8*   Liver Function Tests:  Recent Labs Lab 10/07/13 1743  AST 37  ALT 31  ALKPHOS 56  BILITOT 0.4  PROT 7.1  ALBUMIN 3.5    Recent Labs Lab 10/07/13 1743  LIPASE 24   No results found for this basename: AMMONIA,  in the last 168 hours CBC:  Recent Labs Lab 10/07/13 1743 10/08/13 0603  WBC 5.8 4.8  NEUTROABS 4.9  --   HGB 14.2 10.6*  HCT 41.7 31.1*  MCV 95.2 94.8  PLT 192  162   Cardiac Enzymes:  Recent Labs Lab 10/07/13 2016  TROPONINI <0.30   BNP (last 3 results) No results found for this basename: PROBNP,  in the last 8760 hours CBG: No results found for this basename: GLUCAP,  in the last 168 hours  No results found for this or any previous visit (from the past 240 hour(s)).   Studies: Ct Abdomen Pelvis W Contrast  10/07/2013   CLINICAL DATA:  Lower abdominal pain. Vomiting. Diarrhea. Chills. Previous history of diverticulitis.  EXAM: CT ABDOMEN AND PELVIS WITH CONTRAST  TECHNIQUE: Multidetector CT imaging of the abdomen and pelvis was performed using the standard protocol following bolus administration of intravenous contrast.  CONTRAST:  OMNIPAQUE IOHEXOL 300 MG/ML  SOLN  COMPARISON:  01/12/13  FINDINGS: Tiny left hepatic lobe cyst remains stable. No liver masses are identified. The gallbladder, pancreas, spleen, adrenal glands, and kidneys are normal in appearance. No soft tissue masses or lymphadenopathy identified within the abdomen or pelvis.  Prior hysterectomy noted. Adnexal regions are unremarkable. Diverticulosis is seen mainly involving the descending and sigmoid portions of the colon. Mild colonic wall thickening and pericolonic inflammatory changes seen involving the descending colon with a few tiny extraluminal air bubbles noted. This is consistent with mild diverticulitis with microperforation. There is no evidence of abscess or free fluid. No evidence of bowel obstruction.  IMPRESSION: Mild descending  colon diverticulitis with microperforation. No evidence of abscess or bowel obstruction.   Electronically Signed   By: Myles Rosenthal M.D.   On: 10/07/2013 19:59    Scheduled Meds: . enalapril  5 mg Oral q morning - 10a  . metronidazole  500 mg Intravenous Q8H  . piperacillin-tazobactam (ZOSYN)  IV  3.375 g Intravenous Q8H  . potassium chloride  20 mEq Oral BID  . raloxifene  60 mg Oral q morning - 10a  . spironolactone  50 mg Oral q  morning - 10a   Continuous Infusions:   Assessment: Principal Problem:   Acute diverticulitis Active Problems:   microperforation of colon   IBS (irritable bowel syndrome)   Abdominal pain, acute, left lower quadrant   UTI (lower urinary tract infection)   Diverticulosis of colon (without mention of hemorrhage)   Hypertension   N&V (nausea and vomiting)   Hypokalemia   1. Acute sigmoid diverticulitis with microperforation. The patient is less symptomatic. She has been evaluated by Dr. Lovell Sheehan who recommends medical management as surgical intervention is not needed at this time. We'll continue Zosyn and metronidazole. Continue supportive treatment, gentle IV fluid hydration, and analgesics/antiemetics as needed.  Urinary tract infection. We'll continue antibiotics as above.  Hyponatremia secondary to hypovolemia. Resolved with IV fluid hydration.  Hypokalemia. We'll supplement/replete orally as tolerated.  Hypertension. Her blood pressure is low-normal. Will hold enalapril for systolic blood pressures less than 100.  Anemia. Likely dilutional. Her hemoglobin was 14.2 on admission and 10.6 today. We'll continue to monitor for GI bleeding.    Plan: 1. As above in history present illness. Will advance diet slowly as tolerated. Will keep her on a full liquid for now. 2. Potassium chloride supplementation orally as tolerated. Will check a magnesium level and followup potassium level in the morning. 3. Place parameters on enalapril and spironolactone to hold them for systolic blood pressure less than 100. 4. Urine culture pending.  Time spent: 35 minutes    [facility]  [facility] Pager [phone]. If 7PM-7AM, please contact night-coverage at [email or web address], password [facility] 10/08/2013, 11:46 AM  LOS: 1 day

## 2013-10-08 NOTE — Consult Note (Signed)
Reason for Consult: Sigmoid diverticulitis with microperforation Referring Physician: [facility]  Dorothy Pitts is an 72 y.o. female.  HPI: Patient is a 72 year old white female who presented to [facility] with worsening lower abdominal pain. CT scan of the abdomen and pelvis revealed sigmoid diverticulitis with microperforation. She states that this is her third episode this year. She had a colonoscopy done last year which revealed descending and sigmoid colon diverticulosis. She has had a history of chronic constipation. She states this was partly due to previous hemorrhoid surgery. She currently feels better. She is passing gas. She is tolerating liquid diet well. No nausea is been noted.  Past Medical History  Diagnosis Date  . Hyperlipidemia   . Hypertension   . IBS (irritable bowel syndrome)   . GERD (gastroesophageal reflux disease)   . Cataract   . Hemorrhoids   . Diverticulosis of colon (without mention of hemorrhage)     Past Surgical History  Procedure Laterality Date  . Appendectomy    . Partial hysterectomy    . Bilateral oophorectomy    . Hemorrhoid surgery    . Breast lumpectomy Left   . Tonsillectomy    . Cataract extraction      Family History  Problem Relation Age of Onset  . Colon cancer Neg Hx   . Hypertension Mother   . Heart attack Mother   . Parkinson's disease Father   . Heart disease Brother     Social History:  reports that she quit smoking about 10 years ago. Her smoking use included Cigarettes. She smoked 0.00 packs per day. She has never used smokeless tobacco. She reports that she drinks about 3.0 ounces of alcohol per week. She reports that she does not use illicit drugs.  Allergies:  Allergies  Allergen Reactions  . Actonel [Risedronate Sodium] Other (See Comments)    Reaction=Heart burn  . Fosamax [Alendronate Sodium] Other (See Comments)    Reaction=Heart burn  . Lovaza [Omega-3-Acid Ethyl Esters] Other (See  Comments)    Reaction=skin bruise   . Nsaids Swelling    Reaction=Swelling of face  . Cephalexin Itching and Rash  . Ciprofloxacin Itching and Rash    Medications: I have reviewed the patient's current medications.  Results for orders placed during the hospital encounter of 10/07/13 (from the past 48 hour(s))  CBC WITH DIFFERENTIAL     Status: Abnormal   Collection Time    10/07/13  5:43 PM      Result Value Range   WBC 5.8  4.0 - 10.5 K/uL   RBC 4.38  3.87 - 5.11 MIL/uL   Hemoglobin 14.2  12.0 - 15.0 g/dL   HCT 54.0  98.1 - 19.1 %   MCV 95.2  78.0 - 100.0 fL   MCH 32.4  26.0 - 34.0 pg   MCHC 34.1  30.0 - 36.0 g/dL   RDW 47.8  29.5 - 62.1 %   Platelets 192  150 - 400 K/uL   Neutrophils Relative % 85 (*) 43 - 77 %   Neutro Abs 4.9  1.7 - 7.7 K/uL   Lymphocytes Relative 11 (*) 12 - 46 %   Lymphs Abs 0.7  0.7 - 4.0 K/uL   Monocytes Relative 4  3 - 12 %   Monocytes Absolute 0.2  0.1 - 1.0 K/uL   Eosinophils Relative 0  0 - 5 %   Eosinophils Absolute 0.0  0.0 - 0.7 K/uL   Basophils Relative 0  0 -  1 %   Basophils Absolute 0.0  0.0 - 0.1 K/uL  COMPREHENSIVE METABOLIC PANEL     Status: Abnormal   Collection Time    10/07/13  5:43 PM      Result Value Range   Sodium 131 (*) 135 - 145 mEq/L   Potassium 3.3 (*) 3.5 - 5.1 mEq/L   Chloride 93 (*) 96 - 112 mEq/L   [name] 25  19 - 32 mEq/L   Glucose, Bld 110 (*) 70 - 99 mg/dL   BUN 12  6 - 23 mg/dL   Creatinine, Ser 1.19  0.50 - 1.10 mg/dL   Calcium 9.4  8.4 - 14.7 mg/dL   Total Protein 7.1  6.0 - 8.3 g/dL   Albumin 3.5  3.5 - 5.2 g/dL   AST 37  0 - 37 U/L   ALT 31  0 - 35 U/L   Alkaline Phosphatase 56  39 - 117 U/L   Total Bilirubin 0.4  0.3 - 1.2 mg/dL   GFR calc non Af Amer 85 (*) >90 mL/min   GFR calc Af Amer >90  >90 mL/min   Comment: (NOTE)     The eGFR has been calculated using the CKD EPI equation.     This calculation has not been validated in all clinical situations.     eGFR's persistently <90 mL/min signify  possible Chronic Kidney     Disease.  LIPASE, BLOOD     Status: None   Collection Time    10/07/13  5:43 PM      Result Value Range   Lipase 24  11 - 59 U/L  LACTIC ACID, PLASMA     Status: None   Collection Time    10/07/13  5:43 PM      Result Value Range   Lactic Acid, Venous 1.6  0.5 - 2.2 mmol/L  URINALYSIS, ROUTINE W REFLEX MICROSCOPIC     Status: Abnormal   Collection Time    10/07/13  6:39 PM      Result Value Range   Color, Urine YELLOW  YELLOW   APPearance CLOUDY (*) CLEAR   Specific Gravity, Urine <1.005 (*) 1.005 - 1.030   pH 6.5  5.0 - 8.0   Glucose, UA NEGATIVE  NEGATIVE mg/dL   Hgb urine dipstick MODERATE (*) NEGATIVE   Bilirubin Urine NEGATIVE  NEGATIVE   Ketones, ur 15 (*) NEGATIVE mg/dL   Protein, ur NEGATIVE  NEGATIVE mg/dL   Urobilinogen, UA 0.2  0.0 - 1.0 mg/dL   Nitrite NEGATIVE  NEGATIVE   Leukocytes, UA MODERATE (*) NEGATIVE  URINE MICROSCOPIC-ADD ON     Status: Abnormal   Collection Time    10/07/13  6:39 PM      Result Value Range   Squamous Epithelial / LPF MANY (*) RARE   WBC, UA 21-50  <3 WBC/hpf   Bacteria, UA FEW (*) RARE  TROPONIN I     Status: None   Collection Time    10/07/13  8:16 PM      Result Value Range   Troponin I <0.30  <0.30 ng/mL   Comment:            Due to the release kinetics of cTnI,     a negative result within the first hours     of the onset of symptoms does not rule out     myocardial infarction with certainty.     If myocardial infarction is still suspected,  repeat the test at appropriate intervals.  CBC     Status: Abnormal   Collection Time    10/08/13  6:03 AM      Result Value Range   WBC 4.8  4.0 - 10.5 K/uL   RBC 3.28 (*) 3.87 - 5.11 MIL/uL   Hemoglobin 10.6 (*) 12.0 - 15.0 g/dL   Comment: REPEATED TO VERIFY     DELTA CHECK NOTED   HCT 31.1 (*) 36.0 - 46.0 %   MCV 94.8  78.0 - 100.0 fL   MCH 32.3  26.0 - 34.0 pg   MCHC 34.1  30.0 - 36.0 g/dL   RDW 16.1  09.6 - 04.5 %   Platelets 162  150 - 400  K/uL  BASIC METABOLIC PANEL     Status: Abnormal   Collection Time    10/08/13  6:03 AM      Result Value Range   Sodium 137  135 - 145 mEq/L   Potassium 3.2 (*) 3.5 - 5.1 mEq/L   Chloride 104  96 - 112 mEq/L   Comment: DELTA CHECK NOTED   [name] 25  19 - 32 mEq/L   Glucose, Bld 96  70 - 99 mg/dL   BUN 8  6 - 23 mg/dL   Creatinine, Ser 4.09  0.50 - 1.10 mg/dL   Calcium 7.8 (*) 8.4 - 10.5 mg/dL   GFR calc non Af Amer 83 (*) >90 mL/min   GFR calc Af Amer >90  >90 mL/min   Comment: (NOTE)     The eGFR has been calculated using the CKD EPI equation.     This calculation has not been validated in all clinical situations.     eGFR's persistently <90 mL/min signify possible Chronic Kidney     Disease.    Ct Abdomen Pelvis W Contrast  10/07/2013   CLINICAL DATA:  Lower abdominal pain. Vomiting. Diarrhea. Chills. Previous history of diverticulitis.  EXAM: CT ABDOMEN AND PELVIS WITH CONTRAST  TECHNIQUE: Multidetector CT imaging of the abdomen and pelvis was performed using the standard protocol following bolus administration of intravenous contrast.  CONTRAST:  OMNIPAQUE IOHEXOL 300 MG/ML  SOLN  COMPARISON:  01/12/13  FINDINGS: Tiny left hepatic lobe cyst remains stable. No liver masses are identified. The gallbladder, pancreas, spleen, adrenal glands, and kidneys are normal in appearance. No soft tissue masses or lymphadenopathy identified within the abdomen or pelvis.  Prior hysterectomy noted. Adnexal regions are unremarkable. Diverticulosis is seen mainly involving the descending and sigmoid portions of the colon. Mild colonic wall thickening and pericolonic inflammatory changes seen involving the descending colon with a few tiny extraluminal air bubbles noted. This is consistent with mild diverticulitis with microperforation. There is no evidence of abscess or free fluid. No evidence of bowel obstruction.  IMPRESSION: Mild descending colon diverticulitis with microperforation. No evidence of  abscess or bowel obstruction.   Electronically Signed   By: Myles Rosenthal M.D.   On: 10/07/2013 19:59    ROS: See chart Blood pressure 96/54, pulse 76, temperature 98.7 F (37.1 C), temperature source Oral, resp. rate 18, height 5' 0.5" (1.537 m), weight 56.836 kg (125 lb 4.8 oz), SpO2 98.00%. Physical Exam: Pleasant white female in no acute distress. Abdomen is soft with minimal tenderness in the left lower quadrant and suprapubic region to palpation. No hepatosplenomegaly, masses, or rigidity are noted.  Assessment/Plan: Impression: Sigmoid diverticulitis with microperforation, resolving. Plan: No need for acute surgical intervention at this time. Given her multiple episodes  of diverticulitis this year, I do recommend a partial colectomy in the near future. She will need a two-week course of antibiotics. I told the patient that I would recommend surgery after 6 weeks. All her questions were answered with her and her daughter. I would be more than happy to see her in followup in my office.  Diontay Rosencrans A 10/08/2013, 9:32 AM

## 2013-10-09 DIAGNOSIS — K631 Perforation of intestine (nontraumatic): Secondary | ICD-10-CM

## 2013-10-09 DIAGNOSIS — R197 Diarrhea, unspecified: Secondary | ICD-10-CM

## 2013-10-09 LAB — URINE CULTURE
Colony Count: NO GROWTH
Culture: NO GROWTH

## 2013-10-09 LAB — COMPREHENSIVE METABOLIC PANEL
Alkaline Phosphatase: 42 U/L (ref 39–117)
BUN: 5 mg/dL — ABNORMAL LOW (ref 6–23)
Creatinine, Ser: 0.73 mg/dL (ref 0.50–1.10)
GFR calc Af Amer: >90 mL/min (ref >90)
Glucose, Bld: 94 mg/dL (ref 70–99)
Potassium: 4 mEq/L (ref 3.5–5.1)
Total Bilirubin: 0.2 mg/dL — ABNORMAL LOW (ref 0.3–1.2)
Total Protein: 5.2 g/dL — ABNORMAL LOW (ref 6.0–8.3)

## 2013-10-09 LAB — CBC
HCT: 32.1 % — ABNORMAL LOW (ref 36.0–46.0)
Hemoglobin: 10.7 g/dL — ABNORMAL LOW (ref 12.0–15.0)
MCH: 31.9 pg (ref 26.0–34.0)
MCV: 95.8 fL (ref 78.0–100.0)
Platelets: 167 10*3/uL (ref 150–400)
RBC: 3.35 MIL/uL — ABNORMAL LOW (ref 3.87–5.11)
RDW: 12.9 % (ref 11.5–15.5)
WBC: 4.5 10*3/uL (ref 4.0–10.5)

## 2013-10-09 MED ORDER — SULFAMETHOXAZOLE-TRIMETHOPRIM 400-80 MG PO TABS: 1.0000 | ORAL_TABLET | Freq: Two times a day (BID) | ORAL | Status: DC

## 2013-10-09 MED ORDER — SODIUM CHLORIDE 0.9 % IJ SOLN
3.0000 mL | Freq: Two times a day (BID) | INTRAMUSCULAR | Status: DC
  Administered 2013-10-09: 3 mL via INTRAVENOUS

## 2013-10-09 MED ORDER — SODIUM CHLORIDE 0.9 % IJ SOLN: 3.0000 mL | INTRAMUSCULAR | Status: DC | PRN

## 2013-10-09 MED ORDER — SODIUM CHLORIDE 0.9 % IV SOLN: 250.0000 mL | INTRAVENOUS | Status: DC | PRN

## 2013-10-09 MED ORDER — HYDROCODONE-ACETAMINOPHEN 5-325 MG PO TABS: 1.0000 | ORAL_TABLET | ORAL | Status: DC | PRN

## 2013-10-09 MED ORDER — METRONIDAZOLE 500 MG PO TABS: 500.0000 mg | ORAL_TABLET | Freq: Three times a day (TID) | ORAL | Status: DC

## 2013-10-09 MED ORDER — ONDANSETRON HCL 4 MG PO TABS: 4.0000 mg | ORAL_TABLET | Freq: Four times a day (QID) | ORAL | Status: DC | PRN

## 2013-10-09 NOTE — Care Management Note (Addendum)
    Page 1 of 1   10/09/2013     3:23:01 PM   CARE MANAGEMENT NOTE 10/09/2013  Patient:  Dorothy Pitts, Dorothy Pitts   Account Number:  [id number]  Date Initiated:  10/08/2013  Documentation initiated by:  Lanier Clam  Subjective/Objective Assessment:   72 y/o f admitted w/nausea/v/d.     Action/Plan:   From home.   Anticipated DC Date:  10/11/2013   Anticipated DC Plan:  HOME/SELF CARE      DC Planning Services  CM consult      Choice offered to / List presented to:             Status of service:  Completed, signed off Medicare Important Message given?   (If response is "NO", the following Medicare IM given date fields will be blank) Date Medicare IM given:   Date Additional Medicare IM given:    Discharge Disposition:  HOME/SELF CARE  Per UR Regulation:  Reviewed for med. necessity/level of care/duration of stay  If discussed at Long Length of Stay Meetings, dates discussed:    Comments:  10/09/13 1520 Arlyss Queen, RN BSN CM Pt discharged home today. No CM needs noted.  10/09/13 1030 Arlyss Queen, RN BSN CM Pt lives with her son and is independent with ADL's. No CM needs anticipated.

## 2013-10-09 NOTE — Progress Notes (Signed)
Subjective: No significant abdominal pain. States she is having diarrhea.  Objective: Vital signs in last 24 hours: Temp:  [98.2 F (36.8 C)-98.3 F (36.8 C)] 98.3 F (36.8 C) (12/15 0609) Pulse Rate:  [69-75] 69 (12/15 0609) Resp:  [18-20] 20 (12/15 0609) BP: (93-110)/(60-64) 96/62 mmHg (12/15 0609) SpO2:  [94 %-97 %] 94 % (12/15 0609) Last BM Date: 10/08/13  Intake/Output from previous day: 12/14 0701 - 12/15 0700 In: 520 [P.O.:520] Out: -  Intake/Output this shift: Total I/O In: 480 [P.O.:480] Out: -   General appearance: alert, cooperative and no distress GI: soft, non-tender; bowel sounds normal; no masses,  no organomegaly  Lab Results:   Recent Labs  10/08/13 0603 10/09/13 0503  WBC 4.8 4.5  HGB 10.6* 10.7*  HCT 31.1* 32.1*  PLT 162 167   BMET  Recent Labs  10/08/13 0603 10/09/13 0503  NA 137 140  K 3.2* 4.0  CL 104 108  [name] 25 25  GLUCOSE 96 94  BUN 8 5*  CREATININE 0.75 0.73  CALCIUM 7.8* 8.3*   PT/INR No results found for this basename: LABPROT, INR,  in the last 72 hours  Studies/Results: Ct Abdomen Pelvis W Contrast  10/07/2013   CLINICAL DATA:  Lower abdominal pain. Vomiting. Diarrhea. Chills. Previous history of diverticulitis.  EXAM: CT ABDOMEN AND PELVIS WITH CONTRAST  TECHNIQUE: Multidetector CT imaging of the abdomen and pelvis was performed using the standard protocol following bolus administration of intravenous contrast.  CONTRAST:  OMNIPAQUE IOHEXOL 300 MG/ML  SOLN  COMPARISON:  01/12/13  FINDINGS: Tiny left hepatic lobe cyst remains stable. No liver masses are identified. The gallbladder, pancreas, spleen, adrenal glands, and kidneys are normal in appearance. No soft tissue masses or lymphadenopathy identified within the abdomen or pelvis.  Prior hysterectomy noted. Adnexal regions are unremarkable. Diverticulosis is seen mainly involving the descending and sigmoid portions of the colon. Mild colonic wall thickening and  pericolonic inflammatory changes seen involving the descending colon with a few tiny extraluminal air bubbles noted. This is consistent with mild diverticulitis with microperforation. There is no evidence of abscess or free fluid. No evidence of bowel obstruction.  IMPRESSION: Mild descending colon diverticulitis with microperforation. No evidence of abscess or bowel obstruction.   Electronically Signed   By: Myles Rosenthal M.D.   On: 10/07/2013 19:59    Anti-infectives: Anti-infectives   Start     Dose/Rate Route Frequency Ordered Stop   10/08/13 0500  piperacillin-tazobactam (ZOSYN) IVPB 3.375 g  Status:  Discontinued     3.375 g 12.5 mL/hr over 240 Minutes Intravenous Every 8 hours 10/08/13 0057 10/09/13 0906   10/08/13 0400  metroNIDAZOLE (FLAGYL) IVPB 500 mg     500 mg 100 mL/hr over 60 Minutes Intravenous Every 8 hours 10/07/13 2220     10/07/13 2030  metroNIDAZOLE (FLAGYL) IVPB 500 mg     500 mg 100 mL/hr over 60 Minutes Intravenous  Once 10/07/13 2016 10/07/13 2140   10/07/13 2030  piperacillin-tazobactam (ZOSYN) IVPB 3.375 g     3.375 g 12.5 mL/hr over 240 Minutes Intravenous  Once 10/07/13 2016 10/08/13 0140   10/07/13 1915  sulfamethoxazole-trimethoprim (BACTRIM DS) 800-160 MG per tablet 1 tablet     1 tablet Oral  Once 10/07/13 1905 10/07/13 1958      Assessment/Plan: Impression: Sigmoid diverticulitis, resolving. Patient does now have some diarrhea. Agree with checking C. difficile. Would include Flagyl in any antibiotic regimen as an outpatient. Okay for discharge from my standpoint.  We'll see her as an outpatient in my office.  LOS: 2 days    Jeannette Maddy A 10/09/2013

## 2013-10-09 NOTE — Progress Notes (Signed)
Discharge instructions and prescriptions given, verbalized understanding, out in stable condition ambulatory. 

## 2013-10-09 NOTE — Discharge Summary (Signed)
Physician Discharge Summary  Dorothy Pitts ZOX:[id number] DOB: 02/17/1941 DOA: 10/07/2013  PCP: Rudi Heap, MD  Admit date: 10/07/2013 Discharge date: 10/09/2013  Time spent: Greater than 30 minutes  Recommendations for Outpatient Follow-up:  1. Recommend followup CBC to reassess her hemoglobin/hematocrit. Recommend followup basic metabolic panel to assess her potassium level.  Discharge Diagnoses:  1. Acute descending colon diverticulitis with microperforation. Treated medically. 2. History of previous diverticulitis. 3. Urinary tract infection. 4. Diarrhea. C. difficile PCR negative. 5. Hyponatremia and Hypokalemia, supplement and a repleted. 6. Hypertension. Stable. 7. Anemia, in part dilutional. 8. Nausea, vomiting, diarrhea, and abdominal pain on admission, secondary to #1. Resolved.  Discharge Condition: Improved.  Diet recommendation: Low residue/heart healthy.  Filed Weights   10/07/13 1713 10/07/13 2219  Weight: 53.524 kg (118 lb) 56.836 kg (125 lb 4.8 oz)    History of present illness:  The patient is a 72 year old woman with a history of irritable bowel syndrome and previous diverticulitis, who presented to the emergency department on 10/07/2013 with a chief complaint of right lower quadrant abdominal pain, nausea, vomiting, and loose stools. In the emergency department, she was afebrile and hemodynamically stable. Her lab data were significant for a normal WBC, low sodium of 131, and low potassium of 3.3. Her urinalysis revealed 21-50 WBCs. CT of her abdomen and pelvis revealed mild descending colon diverticulitis with microperforation, but no evidence of abscess or bowel obstruction. She was admitted for further evaluation and management.  Hospital Course:  The patient was started on IV Zosyn and IV metronidazole (the patient is allergic to Cipro).  IV fluids were started for hydration. Zofran was given as needed for nausea. As needed Tylenol and opiate  analgesics were given as needed for pain. She was started on a clear liquid diet initially without advancement. Her potassium was supplemented in the IV fluids and subsequently orally. And now pill was continued for treatment of hypertension, but her blood pressure was low-normal and therefore, parameters were written to hold it for systolic blood pressure less than 100. General surgeon, Dr. Lovell Sheehan was consulted. Following his evaluation, he recommended continued medical management with Zosyn and Flagyl ordered. He did not endorse acute surgical intervention during this hospitalization, but given her multiple episodes of diverticulitis this year, he did recommend a partial colectomy in the future. He told the patient that he would recommend surgery after approximately 6 weeks.  With treatment, the patient improved clinically and symptomatically. C. difficile PCR was sent to rule out infectious diarrhea. It was negative. Urine culture was ordered as well, but at the time of discharge, it revealed no growth. Her serum sodium and potassium normalized with hydration and supplementation. Her hemoglobin did drift down to 10.7, but this was thought to be secondary to the dilutional effects of IV fluids. Her hemoglobin/hematocrit will need to be followed in the outpatient setting. Her diet was advanced which she tolerated well. She was discharged home on 8 more days of Bactrim and metronidazole.  Procedures:  None  Consultations:  General surgery, Dr. Lovell Sheehan  Discharge Exam: Filed Vitals:   10/09/13 1007  BP: 103/64  Pulse:   Temp:   Resp:     General: Pleasant 72 year old woman lying in bed, in no acute distress. Cardiovascular: S1, S2, with a soft systolic murmur. Respiratory: Clear to auscultation bilaterally. Abdomen: Positive bowel sounds, soft, mildly tender left lower quadrant, without rebound guarding or distention.  Discharge Instructions  Discharge Orders   Future Orders Complete By  Expires  Diet - low sodium heart healthy  As directed    Discharge instructions  As directed    Comments:     Eat a low fiber soft diet.   Increase activity slowly  As directed        Medication List    STOP taking these medications       docusate sodium 100 MG capsule  Commonly known as:  COLACE      TAKE these medications       enalapril 5 MG tablet  Commonly known as:  VASOTEC  Take 5 mg by mouth every morning.     HYDROcodone-acetaminophen 5-325 MG per tablet  Commonly known as:  NORCO/VICODIN  Take 1 tablet by mouth every 4 (four) hours as needed for moderate pain.     metroNIDAZOLE 500 MG tablet  Commonly known as:  FLAGYL  Take 1 tablet (500 mg total) by mouth 3 (three) times daily. (Take approximately every 8 hours for treatment of your infection).     multivitamin tablet  Take 1 tablet by mouth daily.     ondansetron 4 MG tablet  Commonly known as:  ZOFRAN  Take 1 tablet (4 mg total) by mouth every 6 (six) hours as needed for nausea or vomiting.     raloxifene 60 MG tablet  Commonly known as:  EVISTA  Take 60 mg by mouth every morning.     ranitidine 150 MG tablet  Commonly known as:  ZANTAC  Take 150 mg by mouth 2 (two) times daily.     spironolactone 50 MG tablet  Commonly known as:  ALDACTONE  Take 50 mg by mouth every morning.     sulfamethoxazole-trimethoprim 400-80 MG per tablet  Commonly known as:  BACTRIM  Take 1 tablet by mouth 2 (two) times daily. For treatment of your infection.     vitamin C 1000 MG tablet  Take 1,000 mg by mouth daily.     Vitamin D3 2000 UNITS Tabs  Take 1 tablet by mouth daily.     zolpidem 5 MG tablet  Commonly known as:  AMBIEN  Take 2.5 mg by mouth at bedtime as needed for sleep.       Allergies  Allergen Reactions  . Actonel [Risedronate Sodium] Other (See Comments)    Reaction=Heart burn  . Fosamax [Alendronate Sodium] Other (See Comments)    Reaction=Heart burn  . Lovaza [Omega-3-Acid Ethyl Esters]  Other (See Comments)    Reaction=skin bruise   . Nsaids Swelling    Reaction=Swelling of face  . Cephalexin Itching and Rash  . Ciprofloxacin Itching and Rash       Follow-up Information   Follow up with Dalia Heading, MD. Schedule an appointment as soon as possible for a visit in 2 weeks.   Specialty:  General Surgery   Contact information:   [address] [place] [address] [phone]        The results of significant diagnostics from this hospitalization (including imaging, microbiology, ancillary and laboratory) are listed below for reference.    Significant Diagnostic Studies: Ct Abdomen Pelvis W Contrast  10/07/2013   CLINICAL DATA:  Lower abdominal pain. Vomiting. Diarrhea. Chills. Previous history of diverticulitis.  EXAM: CT ABDOMEN AND PELVIS WITH CONTRAST  TECHNIQUE: Multidetector CT imaging of the abdomen and pelvis was performed using the standard protocol following bolus administration of intravenous contrast.  CONTRAST:  OMNIPAQUE IOHEXOL 300 MG/ML  SOLN  COMPARISON:  01/12/13  FINDINGS: Tiny left hepatic lobe cyst  remains stable. No liver masses are identified. The gallbladder, pancreas, spleen, adrenal glands, and kidneys are normal in appearance. No soft tissue masses or lymphadenopathy identified within the abdomen or pelvis.  Prior hysterectomy noted. Adnexal regions are unremarkable. Diverticulosis is seen mainly involving the descending and sigmoid portions of the colon. Mild colonic wall thickening and pericolonic inflammatory changes seen involving the descending colon with a few tiny extraluminal air bubbles noted. This is consistent with mild diverticulitis with microperforation. There is no evidence of abscess or free fluid. No evidence of bowel obstruction.  IMPRESSION: Mild descending colon diverticulitis with microperforation. No evidence of abscess or bowel obstruction.   Electronically Signed   By: Myles Rosenthal M.D.   On: 10/07/2013 19:59     Microbiology: No results found for this or any previous visit (from the past 240 hour(s)).   Labs: Basic Metabolic Panel:  Recent Labs Lab 10/07/13 1743 10/08/13 0603 10/09/13 0503  NA 131* 137 140  K 3.3* 3.2* 4.0  CL 93* 104 108  [name] 25 25 25   GLUCOSE 110* 96 94  BUN 12 8 5*  CREATININE 0.69 0.75 0.73  CALCIUM 9.4 7.8* 8.3*   Liver Function Tests:  Recent Labs Lab 10/07/13 1743 10/09/13 0503  AST 37 21  ALT 31 21  ALKPHOS 56 42  BILITOT 0.4 0.2*  PROT 7.1 5.2*  ALBUMIN 3.5 2.5*    Recent Labs Lab 10/07/13 1743  LIPASE 24   No results found for this basename: AMMONIA,  in the last 168 hours CBC:  Recent Labs Lab 10/07/13 1743 10/08/13 0603 10/09/13 0503  WBC 5.8 4.8 4.5  NEUTROABS 4.9  --   --   HGB 14.2 10.6* 10.7*  HCT 41.7 31.1* 32.1*  MCV 95.2 94.8 95.8  PLT 192 162 167   Cardiac Enzymes:  Recent Labs Lab 10/07/13 2016  TROPONINI <0.30   BNP: BNP (last 3 results) No results found for this basename: PROBNP,  in the last 8760 hours CBG: No results found for this basename: GLUCAP,  in the last 168 hours     Signed:  Brenten Janney  [facility] 10/09/2013, 2:54 PM

## 2013-10-10 ENCOUNTER — Encounter (HOSPITAL_COMMUNITY): Payer: Self-pay | Admitting: Internal Medicine

## 2013-10-23 ENCOUNTER — Telehealth: Payer: Self-pay | Admitting: Family Medicine

## 2013-10-23 NOTE — Telephone Encounter (Signed)
Spell patient and she understands that this is important to get this surgery done and she will proceed with the appointment with Dr. Lovell Sheehan

## 2013-10-24 ENCOUNTER — Telehealth: Payer: Self-pay | Admitting: Family Medicine

## 2013-10-24 NOTE — Telephone Encounter (Signed)
Message copied by Azalee Course on Tue Oct 24, 2013  2:57 PM ------      Message from: Ernestina Penna      Created: Tue Oct 10, 2013  8:04 AM       A stool culture for C. Difficile was negative      A urine culture done revealed no growth and therefore no treatment necessary ------

## 2013-11-07 ENCOUNTER — Other Ambulatory Visit: Payer: Self-pay | Admitting: Family Medicine

## 2013-11-13 NOTE — Telephone Encounter (Signed)
This is okay to refill for 6 months 

## 2013-11-13 NOTE — Telephone Encounter (Signed)
Patient last seen in office on 08-03-13. Rx last filled in December. Please advise. If approved please route to Pool A so nurse can phone in to pharmacy

## 2013-11-24 NOTE — Patient Instructions (Addendum)
Dorothy Pitts  11/24/2013   Your procedure is scheduled on:  12/01/2013  [facility]15  [facility].  Call this number if you have problems the morning of surgery: [phone]   Remember:   Do not eat food or drink liquids after midnight.   Take these medicines the morning of surgery with A SIP OF WATER: Enalapril and Ranitidine.    Do not wear jewelry, make-up or nail polish.  Do not wear lotions, powders, or perfumes.  Do not shave 48 hours prior to surgery. Men may shave face and neck.  Do not bring valuables to the hospital.  [facility] belongings or [facility]uables.               Contacts, dentures or bridgework may not be worn into surgery.  Leave suitcase in the car. After surgery it may be brought to your room.   For patients admitted to the hospital, discharge time is determined by your treatment team.               Patients discharged the day of surgery will not be allowed to drive home.    Special Instructions: Shower using CHG 2 nights before surgery and the night before surgery.  If you shower the day of surgery use CHG.  Use special wash - you have one bottle of CHG for all showers.  You should use approximately 1/3 of the bottle for each shower.  Please read over the following fact sheets that you were given: Pain Booklet, Coughing and Deep Breathing, Surgical Site Infection Prevention, Anesthesia Post-op Instructions and Care and Recovery After Surgery   Laparoscopic Colectomy Laparoscopic colectomy is surgery to remove part or all of the large intestine (colon). This procedure is used to treat several conditions, including:  Inflammation and infection of the colon (diverticulitis).  Tumors or masses in the colon.  Inflammatory bowel disease, such as Crohn disease or ulcerative colitis. Colectomy is an option when symptoms cannot be controlled with medicines.  Bleeding from the colon that cannot be controlled by another  method.  Blockage or obstruction of the colon. LET [facility] KNOW ABOUT:  Any allergies you have.  All medicines you are taking, including vitamins, herbs, eye drops, creams, and over-the-counter medicines.  Previous problems you or members of your family have had with the use of anesthetics.  Any blood disorders you have.  Previous surgeries you have had.  Medical conditions you have. RISKS AND COMPLICATIONS Generally, this is a safe procedure. However, as with any procedure, complications can occur. Possible complications include:  Infection.  Bleeding.  Damage to other organs.  Leaking from where the colon was sewn together.  Future blockage of the small intestines from scar tissue. Another surgery may be needed to repair this. In some cases, complications such as damage to other organs or excessive bleeding may require the surgeon to convert from a laparoscopic procedure to an open procedure. This involves making a larger incision in the abdomen to perform the procedure. BEFORE THE PROCEDURE  Ask your health care provider about changing or stopping any regular medicines.  You may be prescribed an oral bowel prep. This involves drinking a large amount of medicated liquid, starting the day before your surgery. The liquid will cause you to have multiple loose stools until your stool is almost clear or light green. This cleans out your colon in preparation for the surgery.  Do not eat or drink anything else once you have started the bowel prep, unless your health care provider tells you it is safe to do so.  You may also be given antibiotic pills to clean out your colon of bacteria. Be sure to follow the directions carefully and take the medicine at the correct time. PROCEDURE   Small monitors will be put on your body. They are used to check your heart, blood pressure, and oxygen level.  An IV access tube will be put into one of your veins. Medicine will be  able to flow directly into your body through this IV tube.  You might be given a medicine to help you relax (sedative).  You will be given a medicine to make you sleep through the procedure (general anesthetic). A breathing tube may be placed into your lungs during the procedure.  A thin, flexible tube (catheter) will be placed into your bladder to collect urine.  A tube may be put in through your nose. It is called a nasogastric tube. It is used to remove stomach fluids after surgery until the intestines start working again.  Your abdomen will be filled with air so that it expands. This gives the surgeon more room to operate and makes your organs easier to see.  Several small cuts (incisions) are made in your abdomen.  A thin, lighted tube with a tiny camera on the end (laparoscope) is put through one of the small incisions. The camera on the laparoscope sends a picture to a TV screen in the operating room. This gives the surgeon a good view inside your abdomen.  Hollow tubes are put through the other small incisions in your abdomen. The tools needed for the procedure are put through these tubes.  Clamps or staples are put on both ends of the diseased part of the colon.  The part of the intestine between the clamps or staples is removed.  If possible, the ends of the healthy colon that remain will be stitched or stapled together to allow your body to expel waste (stool).  Sometimes, the remaining colon cannot be stitched back together. If this is the case, a colostomy is needed. For a colostomy:  An opening (stoma) to the outside of your body is made through the abdomen.  The end of the colon is brought to the opening. It is stitched to the skin.  A bag is attached to the opening. Stool will drain into this bag. The bag is removable.  The colostomy can be temporary or permanent.  The incisions from the colectomy are closed with stitches or staples. AFTER THE PROCEDURE  You will  be monitored closely in a recovery area until you are stable and doing well. You will then be moved to a regular hospital room.  You will need to receive fluids through an IV tube until your bowel function has returned. This may take 1 3 days. Once your bowels are working again, you will be started on clear liquids and then advanced to solid food as tolerated.  You will be given pain medicines to control your pain. Document Released: 01/02/2003 Document Revised: 08/02/2013 Document Reviewed: 05/24/2013 [facility] Patient Information 2014 [place], [place]. PATIENT INSTRUCTIONS POST-ANESTHESIA  IMMEDIATELY FOLLOWING SURGERY:  Do not drive or operate machinery for the first twenty four hours after surgery.  Do not make any important decisions for twenty four hours after surgery or while taking narcotic pain medications or sedatives.  If you develop intractable nausea and vomiting  or a severe headache please notify your doctor immediately.  FOLLOW-UP:  Please make an appointment with your surgeon as instructed. You do not need to follow up with anesthesia unless specifically instructed to do so.  WOUND CARE INSTRUCTIONS (if applicable):  Keep a dry clean dressing on the anesthesia/puncture wound site if there is drainage.  Once the wound has quit draining you may leave it open to air.  Generally you should leave the bandage intact for twenty four hours unless there is drainage.  If the epidural site drains for more than 36-48 hours please call the anesthesia department.  QUESTIONS?:  Please feel free to call your physician or the hospital operator if you have any questions, and they will be happy to assist you.

## 2013-11-27 ENCOUNTER — Encounter (HOSPITAL_COMMUNITY): Payer: Self-pay

## 2013-11-27 ENCOUNTER — Encounter (HOSPITAL_COMMUNITY): Payer: Self-pay | Admitting: Pharmacy Technician

## 2013-11-27 ENCOUNTER — Encounter (HOSPITAL_COMMUNITY)
Admission: RE | Admit: 2013-11-27 | Discharge: 2013-11-27 | Disposition: A | Discharge status: Home / Self Care | Payer: Medicare Other | Source: Ambulatory Visit | Attending: General Surgery | Admitting: General Surgery

## 2013-11-27 DIAGNOSIS — Z01812 Encounter for preprocedural laboratory examination: Secondary | ICD-10-CM | POA: Insufficient documentation

## 2013-11-27 HISTORY — DX: Other specified postprocedural states: Z98.890

## 2013-11-27 HISTORY — DX: Nausea with vomiting, unspecified: R11.2

## 2013-11-27 HISTORY — DX: Other specified disorders of bone density and structure, unspecified site: M85.80

## 2013-11-27 LAB — CBC WITH DIFFERENTIAL/PLATELET
Basophils Absolute: 0 10*3/uL (ref 0.0–0.1)
Basophils Relative: 1 % (ref 0–1)
EOS ABS: 0.1 10*3/uL (ref 0.0–0.7)
Eosinophils Relative: 3 % (ref 0–5)
HCT: 37.9 % (ref 36.0–46.0)
Hemoglobin: 12.7 g/dL (ref 12.0–15.0)
Lymphocytes Relative: 43 % (ref 12–46)
Lymphs Abs: 1.5 10*3/uL (ref 0.7–4.0)
MCH: 32.1 pg (ref 26.0–34.0)
MCHC: 33.5 g/dL (ref 30.0–36.0)
MCV: 95.7 fL (ref 78.0–100.0)
Monocytes Absolute: 0.3 10*3/uL (ref 0.1–1.0)
Monocytes Relative: 8 % (ref 3–12)
Neutro Abs: 1.6 10*3/uL — ABNORMAL LOW (ref 1.7–7.7)
Neutrophils Relative %: 45 % (ref 43–77)
PLATELETS: 291 10*3/uL (ref 150–400)
RBC: 3.96 MIL/uL (ref 3.87–5.11)
RDW: 12.9 % (ref 11.5–15.5)
WBC: 3.6 10*3/uL — ABNORMAL LOW (ref 4.0–10.5)

## 2013-11-27 LAB — TYPE AND SCREEN
ABO/RH(D): A POS
ANTIBODY SCREEN: NEGATIVE

## 2013-11-27 LAB — BASIC METABOLIC PANEL
BUN: 13 mg/dL (ref 6–23)
CALCIUM: 9.6 mg/dL (ref 8.4–10.5)
CO2: 28 mEq/L (ref 19–32)
Chloride: 99 mEq/L (ref 96–112)
Creatinine, Ser: 0.61 mg/dL (ref 0.50–1.10)
GFR calc Af Amer: >90 mL/min (ref >90)
GFR calc non Af Amer: 88 mL/min — ABNORMAL LOW (ref >90)
GLUCOSE: 97 mg/dL (ref 70–99)
Potassium: 4.3 mEq/L (ref 3.7–5.3)
Sodium: 140 mEq/L (ref 137–147)

## 2013-11-27 LAB — ABO/RH: ABO/RH(D): A POS

## 2013-11-27 MED ORDER — CHLORHEXIDINE GLUCONATE 4 % EX LIQD: 1.0000 "application " | Freq: Once | CUTANEOUS | Status: DC

## 2013-11-27 NOTE — H&P (Signed)
Dorothy CootsJudith M Pitts is an 73 y.o. female.   Chief Complaint: Descending colon diverticulitis HPI: Patient is a 73 year old female who was admitted in December of 2014 with microperforation of the descending colon secondary to diverticulitis. She has recovered from this event and now presents for a partial colectomy.  Past Medical History  Diagnosis Date  . Hyperlipidemia   . Hypertension   . IBS (irritable bowel syndrome)   . GERD (gastroesophageal reflux disease)   . Cataract   . Hemorrhoids   . Diverticulosis of colon (without mention of hemorrhage)   . Acute diverticulitis 10/07/2013    With microperforation.  [name] Kitchen. UTI (lower urinary tract infection) 10/07/2013    Past Surgical History  Procedure Laterality Date  . Appendectomy    . Partial hysterectomy    . Bilateral oophorectomy    . Hemorrhoid surgery    . Breast lumpectomy Left   . Tonsillectomy    . Cataract extraction      Family History  Problem Relation Age of Onset  . Colon cancer Neg Hx   . Hypertension Mother   . Heart attack Mother   . Parkinson's disease Father   . Heart disease Brother    Social History:  reports that she quit smoking about 11 years ago. Her smoking use included Cigarettes. She smoked 0.00 packs per day. She has never used smokeless tobacco. She reports that she drinks about 3.0 ounces of alcohol per week. She reports that she does not use illicit drugs.  Allergies:  Allergies  Allergen Reactions  . Actonel [Risedronate Sodium] Other (See Comments)    Reaction=Heart burn  . Fosamax [Alendronate Sodium] Other (See Comments)    Reaction=Heart burn  . Lovaza [Omega-3-Acid Ethyl Esters] Other (See Comments)    Reaction=skin bruise   . Nsaids Swelling    Reaction=Swelling of face  . Cephalexin Itching and Rash  . Ciprofloxacin Itching and Rash    No prescriptions prior to admission    No results found for this or any previous visit (from the past 48 hour(s)). No results  found.  Review of Systems  Gastrointestinal: Positive for abdominal pain.  All other systems reviewed and are negative.    There were no vitals taken for this visit. Physical Exam  Constitutional: She is oriented to person, place, and time. She appears well-developed and well-nourished.  HENT:  Head: Normocephalic and atraumatic.  Neck: Normal range of motion. Neck supple.  Cardiovascular: Normal rate, regular rhythm and normal heart sounds.   Respiratory: Effort normal and breath sounds normal.  GI: Soft. Bowel sounds are normal. She exhibits no distension. There is no tenderness. There is no rebound.  Neurological: She is alert and oriented to person, place, and time.  Skin: Skin is warm and dry.     Assessment/Plan Impression: Descending colon diverticulitis Plan: Patient scheduled for a laparoscopic hand-assisted partial colectomy on 12/01/2013. The risks and benefits of the procedure including bleeding, infection, cardiopulmonary difficulties, the possibility of a blood transfusion, and the possibility of needing a colostomy were fully explained to the patient, who gave informed consent.  Breyson Kelm A 11/27/2013, 9:50 AM

## 2013-11-29 ENCOUNTER — Ambulatory Visit (INDEPENDENT_AMBULATORY_CARE_PROVIDER_SITE_OTHER): Payer: Medicare Other | Admitting: Family Medicine

## 2013-11-29 ENCOUNTER — Telehealth: Payer: Self-pay | Admitting: Family Medicine

## 2013-11-29 ENCOUNTER — Ambulatory Visit (INDEPENDENT_AMBULATORY_CARE_PROVIDER_SITE_OTHER): Payer: Medicare Other

## 2013-11-29 ENCOUNTER — Other Ambulatory Visit: Payer: Self-pay | Admitting: Family Medicine

## 2013-11-29 VITALS — BP 110/59 | HR 77 | Temp 97.6°F | Ht 61.5 in | Wt 121.0 lb

## 2013-11-29 DIAGNOSIS — M25579 Pain in unspecified ankle and joints of unspecified foot: Secondary | ICD-10-CM

## 2013-11-29 NOTE — Telephone Encounter (Signed)
Pt fell last pm injuring L foot appt scheduled

## 2013-11-29 NOTE — Progress Notes (Signed)
   Subjective:    Patient ID: Clearance CootsJudith M Mcnee, female    DOB: 11-Jul-1941, 73 y.o.   MRN: [id number]  HPI  This 73 y.o. female presents for evaluation of sprained ankle.  Review of Systems No chest pain, SOB, HA, dizziness, vision change, N/V, diarrhea, constipation, dysuria, urinary urgency or frequency, myalgias, arthralgias or rash.     Objective:   Physical Exam  Vital signs noted  Well developed well nourished female.  HEENT - Head atraumatic Normocephalic Respiratory - Lungs CTA bilateral Cardiac - RRR S1 and S2 without murmur GI - Abdomen soft Nontender and bowel sounds active x 4 MS - TTP left ankle w/o swelling or deformity  Xray Left ankle - No fx     Assessment & Plan:  Pain in joint, ankle and foot - Plan: DG Foot Complete Left Reassurance Push po fluids, rest, tylenol and motrin otc prn as directed for fever, arthralgias, and myalgias.  Follow up prn if sx's continue or persist.  Deatra CanterWilliam J Kayna Suppa FNP

## 2013-12-01 ENCOUNTER — Inpatient Hospital Stay (HOSPITAL_COMMUNITY): Payer: Medicare Other | Admitting: Anesthesiology

## 2013-12-01 ENCOUNTER — Inpatient Hospital Stay (HOSPITAL_COMMUNITY)
Admission: RE | Admit: 2013-12-01 | Discharge: 2013-12-08 | DRG: 331 | Disposition: A | Discharge status: Home / Self Care | Payer: Medicare Other | Source: Ambulatory Visit | Attending: General Surgery | Admitting: General Surgery

## 2013-12-01 ENCOUNTER — Encounter (HOSPITAL_COMMUNITY)
Admission: RE | Disposition: A | Discharge status: Home / Self Care | Payer: Self-pay | Source: Ambulatory Visit | Attending: General Surgery

## 2013-12-01 ENCOUNTER — Encounter (HOSPITAL_COMMUNITY): Payer: Medicare Other | Admitting: Anesthesiology

## 2013-12-01 ENCOUNTER — Encounter (HOSPITAL_COMMUNITY): Payer: Self-pay | Admitting: *Deleted

## 2013-12-01 DIAGNOSIS — Z8249 Family history of ischemic heart disease and other diseases of the circulatory system: Secondary | ICD-10-CM

## 2013-12-01 DIAGNOSIS — K589 Irritable bowel syndrome without diarrhea: Secondary | ICD-10-CM | POA: Diagnosis present

## 2013-12-01 DIAGNOSIS — K5732 Diverticulitis of large intestine without perforation or abscess without bleeding: Principal | ICD-10-CM | POA: Diagnosis present

## 2013-12-01 DIAGNOSIS — K219 Gastro-esophageal reflux disease without esophagitis: Secondary | ICD-10-CM | POA: Diagnosis present

## 2013-12-01 DIAGNOSIS — E785 Hyperlipidemia, unspecified: Secondary | ICD-10-CM | POA: Diagnosis present

## 2013-12-01 DIAGNOSIS — Z87891 Personal history of nicotine dependence: Secondary | ICD-10-CM

## 2013-12-01 DIAGNOSIS — Z90710 Acquired absence of both cervix and uterus: Secondary | ICD-10-CM

## 2013-12-01 DIAGNOSIS — I10 Essential (primary) hypertension: Secondary | ICD-10-CM | POA: Diagnosis present

## 2013-12-01 HISTORY — PX: COLON RESECTION: SHX5231

## 2013-12-01 SURGERY — COLECTOMY, HAND-ASSISTED, LAPAROSCOPIC
Anesthesia: General

## 2013-12-01 MED ORDER — ROCURONIUM BROMIDE 50 MG/5ML IV SOLN
INTRAVENOUS | Status: AC
  Filled 2013-12-01: qty 1

## 2013-12-01 MED ORDER — MIDAZOLAM HCL 2 MG/2ML IJ SOLN
INTRAMUSCULAR | Status: AC
Start: 2013-12-01 — End: 2013-12-01
  Filled 2013-12-01: qty 2

## 2013-12-01 MED ORDER — LORAZEPAM 2 MG/ML IJ SOLN
0.5000 mg | INTRAMUSCULAR | Status: DC | PRN
  Administered 2013-12-02 – 2013-12-03 (×3): 0.5 mg via INTRAVENOUS
  Filled 2013-12-01 (×3): qty 1

## 2013-12-01 MED ORDER — CLINDAMYCIN PHOSPHATE 900 MG/50ML IV SOLN
INTRAVENOUS | Status: AC
  Filled 2013-12-01: qty 50

## 2013-12-01 MED ORDER — ONDANSETRON HCL 4 MG PO TABS
4.0000 mg | ORAL_TABLET | Freq: Four times a day (QID) | ORAL | Status: DC | PRN
  Administered 2013-12-01 – 2013-12-07 (×4): 4 mg via ORAL
  Filled 2013-12-01 (×4): qty 1

## 2013-12-01 MED ORDER — CLINDAMYCIN PHOSPHATE 900 MG/50ML IV SOLN
900.0000 mg | Freq: Once | INTRAVENOUS | Status: AC
  Administered 2013-12-01: 900 mg via INTRAVENOUS

## 2013-12-01 MED ORDER — NEOSTIGMINE METHYLSULFATE 1 MG/ML IJ SOLN
INTRAMUSCULAR | Status: AC
  Filled 2013-12-01: qty 1

## 2013-12-01 MED ORDER — ALVIMOPAN 12 MG PO CAPS
ORAL_CAPSULE | ORAL | Status: AC
  Filled 2013-12-01: qty 1

## 2013-12-01 MED ORDER — ONDANSETRON HCL 4 MG/2ML IJ SOLN
4.0000 mg | Freq: Once | INTRAMUSCULAR | Status: AC
  Administered 2013-12-01: 4 mg via INTRAVENOUS
  Filled 2013-12-01: qty 2

## 2013-12-01 MED ORDER — PANTOPRAZOLE SODIUM 40 MG PO TBEC
40.0000 mg | DELAYED_RELEASE_TABLET | Freq: Every day | ORAL | Status: DC
  Administered 2013-12-02: 40 mg via ORAL
  Filled 2013-12-01 (×2): qty 1

## 2013-12-01 MED ORDER — GLYCOPYRROLATE 0.2 MG/ML IJ SOLN
INTRAMUSCULAR | Status: DC | PRN
  Administered 2013-12-01: 0.6 mg via INTRAVENOUS

## 2013-12-01 MED ORDER — ONDANSETRON HCL 4 MG/2ML IJ SOLN
4.0000 mg | Freq: Four times a day (QID) | INTRAMUSCULAR | Status: DC | PRN
  Administered 2013-12-02 – 2013-12-08 (×18): 4 mg via INTRAVENOUS
  Filled 2013-12-01 (×19): qty 2

## 2013-12-01 MED ORDER — ENALAPRIL MALEATE 5 MG PO TABS
5.0000 mg | ORAL_TABLET | Freq: Every morning | ORAL | Status: DC
  Administered 2013-12-02: 5 mg via ORAL
  Filled 2013-12-01: qty 1

## 2013-12-01 MED ORDER — SUFENTANIL CITRATE 50 MCG/ML IV SOLN
INTRAVENOUS | Status: DC | PRN
  Administered 2013-12-01 (×5): 10 ug via INTRAVENOUS

## 2013-12-01 MED ORDER — ONDANSETRON HCL 4 MG/2ML IJ SOLN: 4.0000 mg | Freq: Once | INTRAMUSCULAR | Status: DC | PRN

## 2013-12-01 MED ORDER — ENOXAPARIN SODIUM 40 MG/0.4ML ~~LOC~~ SOLN
40.0000 mg | Freq: Once | SUBCUTANEOUS | Status: AC
  Administered 2013-12-01: 40 mg via SUBCUTANEOUS
  Filled 2013-12-01: qty 0.4

## 2013-12-01 MED ORDER — ENOXAPARIN SODIUM 40 MG/0.4ML ~~LOC~~ SOLN
40.0000 mg | SUBCUTANEOUS | Status: DC
  Administered 2013-12-02 – 2013-12-08 (×7): 40 mg via SUBCUTANEOUS
  Filled 2013-12-01 (×7): qty 0.4

## 2013-12-01 MED ORDER — FENTANYL CITRATE 0.05 MG/ML IJ SOLN
INTRAMUSCULAR | Status: AC
  Filled 2013-12-01: qty 2

## 2013-12-01 MED ORDER — ROCURONIUM BROMIDE 100 MG/10ML IV SOLN
INTRAVENOUS | Status: DC | PRN
  Administered 2013-12-01: 10 mg via INTRAVENOUS
  Administered 2013-12-01: 20 mg via INTRAVENOUS
  Administered 2013-12-01 (×2): 10 mg via INTRAVENOUS

## 2013-12-01 MED ORDER — POVIDONE-IODINE 10 % EX OINT
TOPICAL_OINTMENT | CUTANEOUS | Status: AC
  Filled 2013-12-01: qty 1

## 2013-12-01 MED ORDER — EPHEDRINE SULFATE 50 MG/ML IJ SOLN
INTRAMUSCULAR | Status: AC
  Filled 2013-12-01: qty 1

## 2013-12-01 MED ORDER — SUCCINYLCHOLINE CHLORIDE 20 MG/ML IJ SOLN
INTRAMUSCULAR | Status: AC
  Filled 2013-12-01: qty 1

## 2013-12-01 MED ORDER — LIDOCAINE HCL (PF) 1 % IJ SOLN
INTRAMUSCULAR | Status: AC
  Filled 2013-12-01: qty 10

## 2013-12-01 MED ORDER — LACTATED RINGERS IV SOLN
INTRAVENOUS | Status: DC | PRN
  Administered 2013-12-01 (×3): via INTRAVENOUS

## 2013-12-01 MED ORDER — SUFENTANIL CITRATE 50 MCG/ML IV SOLN
INTRAVENOUS | Status: AC
  Filled 2013-12-01: qty 1

## 2013-12-01 MED ORDER — MIDAZOLAM HCL 2 MG/2ML IJ SOLN
1.0000 mg | INTRAMUSCULAR | Status: DC | PRN
  Administered 2013-12-01: 2 mg via INTRAVENOUS

## 2013-12-01 MED ORDER — BUPIVACAINE HCL (PF) 0.5 % IJ SOLN
INTRAMUSCULAR | Status: AC
  Filled 2013-12-01: qty 30

## 2013-12-01 MED ORDER — LIDOCAINE HCL (CARDIAC) 20 MG/ML IV SOLN
INTRAVENOUS | Status: DC | PRN
  Administered 2013-12-01: 25 mg via INTRAVENOUS

## 2013-12-01 MED ORDER — GLYCOPYRROLATE 0.2 MG/ML IJ SOLN
INTRAMUSCULAR | Status: AC
  Filled 2013-12-01: qty 3

## 2013-12-01 MED ORDER — SODIUM CHLORIDE BACTERIOSTATIC 0.9 % IJ SOLN
INTRAMUSCULAR | Status: AC
  Filled 2013-12-01: qty 20

## 2013-12-01 MED ORDER — FENTANYL CITRATE 0.05 MG/ML IJ SOLN
50.0000 ug | INTRAMUSCULAR | Status: DC | PRN
  Administered 2013-12-01 – 2013-12-02 (×2): 50 ug via INTRAVENOUS
  Filled 2013-12-01 (×2): qty 2

## 2013-12-01 MED ORDER — METRONIDAZOLE IN NACL 5-0.79 MG/ML-% IV SOLN
INTRAVENOUS | Status: AC
  Filled 2013-12-01: qty 100

## 2013-12-01 MED ORDER — SODIUM CHLORIDE BACTERIOSTATIC 0.9 % IJ SOLN
INTRAMUSCULAR | Status: AC
  Filled 2013-12-01: qty 10

## 2013-12-01 MED ORDER — SODIUM CHLORIDE 0.9 % IR SOLN
Status: DC | PRN
  Administered 2013-12-01: 2000 mL
  Administered 2013-12-01: 1000 mL

## 2013-12-01 MED ORDER — HEMOSTATIC AGENTS (NO CHARGE) OPTIME
TOPICAL | Status: DC | PRN
  Administered 2013-12-01: 1 via TOPICAL

## 2013-12-01 MED ORDER — LACTATED RINGERS IV SOLN
INTRAVENOUS | Status: DC
  Administered 2013-12-01 – 2013-12-02 (×2): via INTRAVENOUS

## 2013-12-01 MED ORDER — FENTANYL CITRATE 0.05 MG/ML IJ SOLN
25.0000 ug | INTRAMUSCULAR | Status: DC | PRN
  Administered 2013-12-01 (×4): 50 ug via INTRAVENOUS
  Filled 2013-12-01: qty 2

## 2013-12-01 MED ORDER — EPHEDRINE SULFATE 50 MG/ML IJ SOLN
INTRAMUSCULAR | Status: DC | PRN
  Administered 2013-12-01: 5 mg via INTRAVENOUS
  Administered 2013-12-01: 10 mg via INTRAVENOUS
  Administered 2013-12-01: 5 mg via INTRAVENOUS

## 2013-12-01 MED ORDER — POVIDONE-IODINE 10 % OINT PACKET
TOPICAL_OINTMENT | CUTANEOUS | Status: DC | PRN
  Administered 2013-12-01: 1 via TOPICAL

## 2013-12-01 MED ORDER — DEXAMETHASONE SODIUM PHOSPHATE 4 MG/ML IJ SOLN
4.0000 mg | Freq: Once | INTRAMUSCULAR | Status: AC
  Administered 2013-12-01: 4 mg via INTRAVENOUS
  Filled 2013-12-01: qty 1

## 2013-12-01 MED ORDER — SPIRONOLACTONE 50 MG PO TABS: 50.0000 mg | ORAL_TABLET | Freq: Every morning | ORAL | Status: DC

## 2013-12-01 MED ORDER — SUCCINYLCHOLINE CHLORIDE 20 MG/ML IJ SOLN
INTRAMUSCULAR | Status: DC | PRN
  Administered 2013-12-01: 100 mg via INTRAVENOUS

## 2013-12-01 MED ORDER — PROPOFOL 10 MG/ML IV EMUL
INTRAVENOUS | Status: AC
Start: 2013-12-01 — End: 2013-12-01
  Filled 2013-12-01: qty 20

## 2013-12-01 MED ORDER — LACTATED RINGERS IV SOLN
INTRAVENOUS | Status: DC
  Administered 2013-12-01: 07:00:00 via INTRAVENOUS

## 2013-12-01 MED ORDER — ALVIMOPAN 12 MG PO CAPS
12.0000 mg | ORAL_CAPSULE | Freq: Once | ORAL | Status: AC
Start: 2013-12-01 — End: 2013-12-01
  Administered 2013-12-01: 12 mg via ORAL

## 2013-12-01 MED ORDER — METRONIDAZOLE IN NACL 5-0.79 MG/ML-% IV SOLN
500.0000 mg | INTRAVENOUS | Status: AC
  Administered 2013-12-01: 500 mg via INTRAVENOUS

## 2013-12-01 MED ORDER — PROPOFOL 10 MG/ML IV BOLUS
INTRAVENOUS | Status: DC | PRN
  Administered 2013-12-01: 100 mg via INTRAVENOUS

## 2013-12-01 MED ORDER — PHENYLEPHRINE HCL 10 MG/ML IJ SOLN
INTRAMUSCULAR | Status: DC | PRN
  Administered 2013-12-01 (×4): 100 ug via INTRAVENOUS

## 2013-12-01 MED ORDER — SPIRONOLACTONE 25 MG PO TABS
50.0000 mg | ORAL_TABLET | Freq: Every morning | ORAL | Status: DC
  Administered 2013-12-01 – 2013-12-02 (×2): 50 mg via ORAL
  Filled 2013-12-01 (×2): qty 2

## 2013-12-01 MED ORDER — SCOPOLAMINE 1 MG/3DAYS TD PT72
1.0000 | MEDICATED_PATCH | Freq: Once | TRANSDERMAL | Status: DC
  Administered 2013-12-01: 1.5 mg via TRANSDERMAL
  Filled 2013-12-01: qty 1

## 2013-12-01 MED ORDER — PHENYLEPHRINE HCL 10 MG/ML IJ SOLN
INTRAMUSCULAR | Status: AC
  Filled 2013-12-01: qty 1

## 2013-12-01 MED ORDER — ZOLPIDEM TARTRATE 5 MG PO TABS: 5.0000 mg | ORAL_TABLET | Freq: Every evening | ORAL | Status: DC | PRN

## 2013-12-01 MED ORDER — NEOSTIGMINE METHYLSULFATE 1 MG/ML IJ SOLN
INTRAMUSCULAR | Status: DC | PRN
  Administered 2013-12-01: 4 mg via INTRAVENOUS

## 2013-12-01 MED ORDER — HYDROCODONE-ACETAMINOPHEN 5-325 MG PO TABS
1.0000 | ORAL_TABLET | ORAL | Status: DC | PRN
  Administered 2013-12-01 – 2013-12-02 (×5): 2 via ORAL
  Filled 2013-12-01: qty 2
  Filled 2013-12-01: qty 1
  Filled 2013-12-01: qty 2
  Filled 2013-12-01: qty 1
  Filled 2013-12-01 (×3): qty 2

## 2013-12-01 SURGICAL SUPPLY — 59 items
BAG HAMPER (MISCELLANEOUS) ×3 IMPLANT
BLADE HEX COATED 2.75 (ELECTRODE) ×3 IMPLANT
BLADE SURG SZ10 CARB STEEL (BLADE) ×3 IMPLANT
CHLORAPREP W/TINT 26ML (MISCELLANEOUS) ×3 IMPLANT
CLOTH BEACON ORANGE TIMEOUT ST (SAFETY) ×3 IMPLANT
COVER LIGHT HANDLE STERIS (MISCELLANEOUS) ×6 IMPLANT
DRAPE INCISE IOBAN 44X35 STRL (DRAPES) ×3 IMPLANT
DRAPE PROXIMA HALF (DRAPES) ×3 IMPLANT
DRAPE WARM FLUID 44X44 (DRAPE) ×3 IMPLANT
DRSG OPSITE POSTOP 4X10 (GAUZE/BANDAGES/DRESSINGS) ×3 IMPLANT
DRSG OPSITE POSTOP 4X8 (GAUZE/BANDAGES/DRESSINGS) ×3 IMPLANT
ELECT BLADE 6 FLAT ULTRCLN (ELECTRODE) ×3 IMPLANT
ELECT REM PT RETURN 9FT ADLT (ELECTROSURGICAL) ×3
ELECTRODE REM PT RTRN 9FT ADLT (ELECTROSURGICAL) ×1 IMPLANT
FILTER SMOKE EVAC LAPAROSHD (FILTER) ×3 IMPLANT
GLOVE BIO SURGEON STRL SZ7.5 (GLOVE) ×6 IMPLANT
GLOVE BIOGEL PI IND STRL 7.0 (GLOVE) ×5 IMPLANT
GLOVE BIOGEL PI IND STRL 7.5 (GLOVE) ×1 IMPLANT
GLOVE BIOGEL PI IND STRL 8.5 (GLOVE) ×2 IMPLANT
GLOVE BIOGEL PI INDICATOR 7.0 (GLOVE) ×10
GLOVE BIOGEL PI INDICATOR 7.5 (GLOVE) ×2
GLOVE BIOGEL PI INDICATOR 8.5 (GLOVE) ×4
GLOVE ECLIPSE 6.5 STRL STRAW (GLOVE) ×3 IMPLANT
GLOVE ECLIPSE 7.0 STRL STRAW (GLOVE) ×9 IMPLANT
GLOVE ECLIPSE 8.5 STRL (GLOVE) ×6 IMPLANT
GOWN STRL REUS W/TWL LRG LVL3 (GOWN DISPOSABLE) ×12 IMPLANT
GOWN STRL REUS W/TWL XL LVL3 (GOWN DISPOSABLE) ×6 IMPLANT
HEMOSTAT SNOW SURGICEL 2X4 (HEMOSTASIS) ×3 IMPLANT
INST SET LAPROSCOPIC AP (KITS) ×3 IMPLANT
INST SET MAJOR GENERAL (KITS) ×3 IMPLANT
KIT ROOM TURNOVER AP CYSTO (KITS) ×3 IMPLANT
LIGASURE IMPACT 36 18CM CVD LR (INSTRUMENTS) ×3 IMPLANT
MANIFOLD NEPTUNE II (INSTRUMENTS) ×3 IMPLANT
NS IRRIG 1000ML POUR BTL (IV SOLUTION) ×9 IMPLANT
PACK LAP CHOLE LZT030E (CUSTOM PROCEDURE TRAY) ×3 IMPLANT
PAD ARMBOARD 7.5X6 YLW CONV (MISCELLANEOUS) ×3 IMPLANT
PENCIL HANDSWITCHING (ELECTRODE) ×6 IMPLANT
RELOAD PROXIMATE 75MM BLUE (ENDOMECHANICALS) ×6 IMPLANT
SET BASIN LINEN APH (SET/KITS/TRAYS/PACK) ×3 IMPLANT
SHEET LAVH (DRAPES) ×3 IMPLANT
SPONGE GAUZE 2X2 8PLY STER LF (GAUZE/BANDAGES/DRESSINGS) ×2
SPONGE GAUZE 2X2 8PLY STRL LF (GAUZE/BANDAGES/DRESSINGS) ×4 IMPLANT
SPONGE LAP 18X18 X RAY DECT (DISPOSABLE) ×9 IMPLANT
STAPLER GUN LINEAR PROX 60 (STAPLE) ×3 IMPLANT
STAPLER PROXIMATE 75MM BLUE (STAPLE) ×3 IMPLANT
STAPLER VISISTAT (STAPLE) ×3 IMPLANT
SUCTION POOLE TIP (SUCTIONS) ×3 IMPLANT
SUT PDS AB 0 CTX 60 (SUTURE) ×6 IMPLANT
SUT SILK 3 0 SH CR/8 (SUTURE) ×6 IMPLANT
SYS LAPSCP GELPORT 120MM (MISCELLANEOUS) ×3
SYSTEM LAPSCP GELPORT 120MM (MISCELLANEOUS) ×1 IMPLANT
TAPE CLOTH SURG 4X10 WHT LF (GAUZE/BANDAGES/DRESSINGS) ×3 IMPLANT
TRAY FOLEY CATH 16FR SILVER (SET/KITS/TRAYS/PACK) ×3 IMPLANT
TROCAR ENDO BLADELESS 11MM (ENDOMECHANICALS) ×3 IMPLANT
TROCAR XCEL NON-BLD 5MMX100MML (ENDOMECHANICALS) IMPLANT
TROCAR XCEL UNIV SLVE 11M 100M (ENDOMECHANICALS) ×3 IMPLANT
TUBING INSUF HEATED (TUBING) ×3 IMPLANT
WARMER LAPAROSCOPE (MISCELLANEOUS) ×3 IMPLANT
YANKAUER SUCT BULB TIP 10FT TU (MISCELLANEOUS) ×6 IMPLANT

## 2013-12-01 NOTE — Progress Notes (Signed)
Utilization Review Complete  

## 2013-12-01 NOTE — Interval H&P Note (Signed)
History and Physical Interval Note:  12/01/2013 7:23 AM  [name]  has presented today for surgery, with the diagnosis of diverticulitis  The various methods of treatment have been discussed with the patient and family. After consideration of risks, benefits and other options for treatment, the patient has consented to  Procedure(s): HAND ASSISTED LAPAROSCOPIC PARTIAL COLECTOMY (N/A) as a surgical intervention .  The patient's history has been reviewed, patient examined, no change in status, stable for surgery.  I have reviewed the patient's chart and labs.  Questions were answered to the patient's satisfaction.     Franky MachoJENKINS,Natasia Sanko A

## 2013-12-01 NOTE — Anesthesia Procedure Notes (Signed)
Procedure Name: Intubation Date/Time: 12/01/2013 7:40 AM Performed by: Pernell DupreADAMS, Manuella Blackson A Pre-anesthesia Checklist: Patient identified, Patient being monitored, Timeout performed, Emergency Drugs available and Suction available Patient Re-evaluated:Patient Re-evaluated prior to inductionOxygen Delivery Method: Circle System Utilized Preoxygenation: Pre-oxygenation with 100% oxygen Intubation Type: IV induction, Rapid sequence and Cricoid Pressure applied Ventilation: Mask ventilation without difficulty Laryngoscope Size: 3 and Miller Grade View: Grade I Tube type: Oral Tube size: 7.0 mm Number of attempts: 1 Airway Equipment and Method: stylet Placement Confirmation: ETT inserted through vocal cords under direct vision,  positive ETCO2 and breath sounds checked- equal and bilateral Secured at: 21 cm Tube secured with: Tape Dental Injury: Teeth and Oropharynx as per pre-operative assessment

## 2013-12-01 NOTE — Anesthesia Preprocedure Evaluation (Signed)
Anesthesia Evaluation  Patient identified by MRN, date of birth, ID band Patient awake    Reviewed: Allergy & Precautions, H&P , NPO status , Patient's Chart, lab work & pertinent test results  History of Anesthesia Complications (+) PONV and history of anesthetic complications  Airway Mallampati: I TM Distance: >3 FB Neck ROM: Full    Dental  (+) Teeth Intact   Pulmonary former smoker,  breath sounds clear to auscultation        Cardiovascular hypertension, Pt. on medications Rhythm:Regular Rate:Normal     Neuro/Psych    GI/Hepatic GERD-  Medicated and Controlled,  Endo/Other    Renal/GU      Musculoskeletal   Abdominal   Peds  Hematology   Anesthesia Other Findings   Reproductive/Obstetrics                           Anesthesia Physical Anesthesia Plan  ASA: II  Anesthesia Plan: General   Post-op Pain Management:    Induction: Intravenous, Cricoid pressure planned and Rapid sequence  Airway Management Planned: Oral ETT  Additional Equipment:   Intra-op Plan:   Post-operative Plan: Extubation in OR  Informed Consent: I have reviewed the patients History and Physical, chart, labs and discussed the procedure including the risks, benefits and alternatives for the proposed anesthesia with the patient or authorized representative who has indicated his/her understanding and acceptance.     Plan Discussed with:   Anesthesia Plan Comments:         Anesthesia Quick Evaluation

## 2013-12-01 NOTE — Op Note (Signed)
Patient:  Dorothy CootsJudith M Pitts  DOB:  1941/07/27  MRN:  [id number]   Preop Diagnosis:  Descending colon diverticulitis  Postop Diagnosis:  Same  Procedure:  Laparoscopic hand-assisted partial colectomy  Surgeon:  Franky MachoMark Alexanderjames Berg, M.D.  Anes:  General endotracheal  Indications:  Patient is a 73 year old white female who recently was hospitalized with microperforation of diverticulitis in the descending colon. She now presents for a partial colectomy. The risks and benefits of the procedure including bleeding, infection, the possibly of a blood transfusion, and the possibility of an open procedure were fully explained to the patient, who gave informed consent.  Procedure note:  The patient was placed in the lithotomy position after induction of general endotracheal anesthesia. The abdomen was prepped and draped using usual sterile technique with DuraPrep. Surgical site confirmation was performed.  A long midline incision was made to a previous surgical scar. The peritoneal cavity was entered into without difficulty. A GelPort was then inserted. An and additional 11 mm trocar was placed left lower quadrant region in addition 1 placed in the right upper quadrant region. Both of these were done under direct palpation. The abdomen was then insufflated to 16 mm mercury pressure. The liver was inspected and noted to be within normal limits. The left colon was mobilized along the peritoneal reflection. This was done using Endo Shears and cautery. The dissection was taken up to the splenic flexure and a portion the splenic flexure was taken down using the Endo Shears. A thickened the bowel was noted in the mid descending colon region. This was the area of previous perforation. No abscess cavity was present. The sigmoid colon was mobilized along its peritoneal reflection after some adhesions of small bowel were freed away sharply. Care was taken to avoid the left ureter which was identified. Once the dissection  was performed, it was elected to extend the incision and proceed with the open portion of the procedure. A GIA stapler was placed across the distal transverse colon as well as mid sigmoid colon and fired. The left colon mesentery was then divided using the LigaSure. The splenic flexure was taken down using the LigaSure. The specimen was then sent to pathology further examination. A side to side colon anastomosis from the transverse colon to the sigmoid colon was then performed using a GIA 75 stapler. The colotomy was closed using a TA 60 stapler. The staple line was bolstered using 3-0 silk sutures. The surrounding omentum was then placed over the anastomosis and secured using 3-0 silk sutures. All operating personnel then changed their gloves and gowns. The abdominal cavity was then copiously irrigated with normal saline. The anastomosis was again inspected and noted to be viable and patent. Surgicel was placed along the sigmoid colon peritoneal reflection mesentery. The bowel was returned into the abdominal cavity and in an orderly fashion. The fascia was reapproximated using a looped 0 PDS running suture. Subcutaneous layer was irrigated normal saline and the skin was closed using staples. Betadine ointment and dry sterile dressings were applied.  All tape and needle counts were correct at the end of the procedure. Patient was extubated in the operating room and transferred to PACU in stable condition.  Complications:  None  EBL:  150 cc  Specimen:  Descending colon

## 2013-12-01 NOTE — Anesthesia Postprocedure Evaluation (Signed)
  Anesthesia Post-op Note  Patient: Dorothy Pitts  Procedure(s) Performed: Procedure(s): HAND ASSISTED LAPAROSCOPIC PARTIAL COLECTOMY (N/A)  Patient Location: PACU  Anesthesia Type:General  Level of Consciousness: sedated and patient cooperative  Airway and Oxygen Therapy: Patient Spontanous Breathing and Patient connected to face mask oxygen  Post-op Pain: mild  Post-op Assessment: Post-op Vital signs reviewed, Patient's Cardiovascular Status Stable, Respiratory Function Stable, Patent Airway, No signs of Nausea or vomiting and Pain level controlled  Post-op Vital Signs: Reviewed and stable  Complications: No apparent anesthesia complications

## 2013-12-01 NOTE — Transfer of Care (Signed)
Immediate Anesthesia Transfer of Care Note  Patient: Dorothy Pitts  Procedure(s) Performed: Procedure(s): HAND ASSISTED LAPAROSCOPIC PARTIAL COLECTOMY (N/A)  Patient Location: PACU  Anesthesia Type:General  Level of Consciousness: sedated and patient cooperative  Airway & Oxygen Therapy: Patient Spontanous Breathing and Patient connected to face mask oxygen  Post-op Assessment: Report given to PACU RN and Post -op Vital signs reviewed and stable  Post vital signs: Reviewed and stable  Complications: No apparent anesthesia complications

## 2013-12-01 NOTE — Preoperative (Signed)
Beta Blockers   Reason not to administer Beta Blockers:Not Applicable 

## 2013-12-02 LAB — CBC
HCT: 31.8 % — ABNORMAL LOW (ref 36.0–46.0)
Hemoglobin: 10.5 g/dL — ABNORMAL LOW (ref 12.0–15.0)
MCH: 31.6 pg (ref 26.0–34.0)
MCHC: 33 g/dL (ref 30.0–36.0)
MCV: 95.8 fL (ref 78.0–100.0)
Platelets: 282 K/uL (ref 150–400)
RBC: 3.32 MIL/uL — ABNORMAL LOW (ref 3.87–5.11)
RDW: 12.2 % (ref 11.5–15.5)
WBC: 12.1 K/uL — ABNORMAL HIGH (ref 4.0–10.5)

## 2013-12-02 LAB — BASIC METABOLIC PANEL WITH GFR
BUN: 13 mg/dL (ref 6–23)
CO2: 27 meq/L (ref 19–32)
Calcium: 8.5 mg/dL (ref 8.4–10.5)
Chloride: 100 meq/L (ref 96–112)
Creatinine, Ser: 0.57 mg/dL (ref 0.50–1.10)
GFR calc Af Amer: >90 mL/min
GFR calc non Af Amer: >90 mL/min
Glucose, Bld: 127 mg/dL — ABNORMAL HIGH (ref 70–99)
Potassium: 4.1 meq/L (ref 3.7–5.3)
Sodium: 137 meq/L (ref 137–147)

## 2013-12-02 LAB — MAGNESIUM: Magnesium: 1.4 mg/dL — ABNORMAL LOW (ref 1.5–2.5)

## 2013-12-02 LAB — PHOSPHORUS: Phosphorus: 3 mg/dL (ref 2.3–4.6)

## 2013-12-02 MED ORDER — PROMETHAZINE HCL 25 MG/ML IJ SOLN
6.2500 mg | Freq: Four times a day (QID) | INTRAMUSCULAR | Status: DC | PRN
  Administered 2013-12-02: 6.25 mg via INTRAVENOUS
  Filled 2013-12-02 (×2): qty 1

## 2013-12-02 MED ORDER — KCL IN DEXTROSE-NACL 20-5-0.45 MEQ/L-%-% IV SOLN
INTRAVENOUS | Status: DC
  Administered 2013-12-02 – 2013-12-07 (×9): via INTRAVENOUS

## 2013-12-02 MED ORDER — MAGNESIUM SULFATE 40 MG/ML IJ SOLN
2.0000 g | Freq: Once | INTRAMUSCULAR | Status: AC
  Administered 2013-12-02: 2 g via INTRAVENOUS
  Filled 2013-12-02: qty 50

## 2013-12-02 MED ORDER — ACETAMINOPHEN 325 MG PO TABS
650.0000 mg | ORAL_TABLET | ORAL | Status: DC | PRN
  Administered 2013-12-02: 650 mg via ORAL
  Filled 2013-12-02: qty 2

## 2013-12-02 MED ORDER — ALUM & MAG HYDROXIDE-SIMETH 200-200-20 MG/5ML PO SUSP
30.0000 mL | ORAL | Status: DC | PRN
  Administered 2013-12-02 – 2013-12-03 (×4): 30 mL via ORAL
  Filled 2013-12-02 (×4): qty 30

## 2013-12-02 NOTE — Anesthesia Postprocedure Evaluation (Signed)
  Anesthesia Post-op Note  Patient: Dorothy Pitts  Procedure(s) Performed: Procedure(s): HAND ASSISTED LAPAROSCOPIC PARTIAL COLECTOMY (N/A)  Patient Location:Room 336  Anesthesia Type:General  Level of Consciousness: awake, alert , oriented and patient cooperative  Airway and Oxygen Therapy: Patient Spontanous Breathing  Post-op Pain: mild  Post-op Assessment: Post-op Vital signs reviewed, Patient's Cardiovascular Status Stable, Respiratory Function Stable, NAUSEA AND VOMITING PRESENT and Pain level controlled  Post-op Vital Signs: Reviewed and stable  Complications: No apparent anesthesia complications

## 2013-12-02 NOTE — Progress Notes (Signed)
1 Day Post-Op  Subjective: Nauseated with one episode of emesis. Otherwise looks good.  Objective: Vital signs in last 24 hours: Temp:  [97.4 F (36.3 C)-99.4 F (37.4 C)] 99.4 F (37.4 C) (02/07 0500) Pulse Rate:  [77-86] 85 (02/07 0500) Resp:  [10-20] 16 (02/07 0500) BP: (101-138)/(43-64) 118/50 mmHg (02/07 0500) SpO2:  [95 %-100 %] 95 % (02/07 0500) Last BM Date: 11/30/13  Intake/Output from previous day: 02/06 0701 - 02/07 0700 In: 2000 [I.V.:2000] Out: 850 [Urine:700; Blood:150] Intake/Output this shift:    General appearance: alert, cooperative and no distress Resp: clear to auscultation bilaterally Cardio: regular rate and rhythm, S1, S2 normal, no murmur, click, rub or gallop GI: Soft. Minimal bowel sounds appreciated. Dressings dry and intact.  Lab Results:   Recent Labs  12/02/13 0526  WBC 12.1*  HGB 10.5*  HCT 31.8*  PLT 282   BMET  Recent Labs  12/02/13 0526  NA 137  K 4.1  CL 100  [name] 27  GLUCOSE 127*  BUN 13  CREATININE 0.57  CALCIUM 8.5   PT/INR No results found for this basename: LABPROT, INR,  in the last 72 hours  Studies/Results: No results found.  Anti-infectives: Anti-infectives   Start     Dose/Rate Route Frequency Ordered Stop   12/01/13 0630  clindamycin (CLEOCIN) IVPB 900 mg     900 mg 100 mL/hr over 30 Minutes Intravenous  Once 12/01/13 0625 12/01/13 0742   12/01/13 0625  metroNIDAZOLE (FLAGYL) IVPB 500 mg     500 mg 100 mL/hr over 60 Minutes Intravenous On call to O.R. 12/01/13 [id number] 12/01/13 0759      Assessment/Plan: s/p Procedure(s): HAND ASSISTED LAPAROSCOPIC PARTIAL COLECTOMY Impression: Stable on postoperative day one. Nausea may be secondary to anesthesia or pain medications. We'll start MiraLAX. Patient already on Protonix for her reflux disease. Will treat hypomagnesemia.  LOS: 1 day    Evita Merida A 12/02/2013

## 2013-12-03 LAB — CBC
HEMATOCRIT: 33.9 % — AB (ref 36.0–46.0)
Hemoglobin: 11.4 g/dL — ABNORMAL LOW (ref 12.0–15.0)
MCH: 31.9 pg (ref 26.0–34.0)
MCHC: 33.6 g/dL (ref 30.0–36.0)
MCV: 95 fL (ref 78.0–100.0)
Platelets: 310 10*3/uL (ref 150–400)
RBC: 3.57 MIL/uL — AB (ref 3.87–5.11)
RDW: 12.7 % (ref 11.5–15.5)
WBC: 10.9 10*3/uL — ABNORMAL HIGH (ref 4.0–10.5)

## 2013-12-03 LAB — PHOSPHORUS: Phosphorus: 1.5 mg/dL — ABNORMAL LOW (ref 2.3–4.6)

## 2013-12-03 LAB — BASIC METABOLIC PANEL
BUN: 10 mg/dL (ref 6–23)
CALCIUM: 9 mg/dL (ref 8.4–10.5)
CO2: 37 meq/L — AB (ref 19–32)
Chloride: 96 mEq/L (ref 96–112)
Creatinine, Ser: 0.54 mg/dL (ref 0.50–1.10)
GFR calc Af Amer: >90 mL/min (ref >90)
GFR calc non Af Amer: >90 mL/min (ref >90)
GLUCOSE: 173 mg/dL — AB (ref 70–99)
POTASSIUM: 3.7 meq/L (ref 3.7–5.3)
SODIUM: 140 meq/L (ref 137–147)

## 2013-12-03 LAB — MAGNESIUM: Magnesium: 2.3 mg/dL (ref 1.5–2.5)

## 2013-12-03 MED ORDER — METOCLOPRAMIDE HCL 5 MG/ML IJ SOLN
10.0000 mg | Freq: Once | INTRAMUSCULAR | Status: AC
  Administered 2013-12-03: 10 mg via INTRAVENOUS
  Filled 2013-12-03: qty 2

## 2013-12-03 MED ORDER — METOCLOPRAMIDE HCL 5 MG/ML IJ SOLN
10.0000 mg | Freq: Three times a day (TID) | INTRAMUSCULAR | Status: DC
  Administered 2013-12-03 – 2013-12-08 (×15): 10 mg via INTRAVENOUS
  Filled 2013-12-03 (×16): qty 2

## 2013-12-03 MED ORDER — MAGNESIUM HYDROXIDE 400 MG/5ML PO SUSP
30.0000 mL | Freq: Two times a day (BID) | ORAL | Status: DC
  Administered 2013-12-04: 30 mL via ORAL
  Filled 2013-12-03 (×2): qty 30

## 2013-12-03 MED ORDER — POTASSIUM PHOSPHATE DIBASIC 3 MMOLE/ML IV SOLN
15.0000 mmol | Freq: Once | INTRAVENOUS | Status: AC
  Administered 2013-12-03: 15 mmol via INTRAVENOUS
  Filled 2013-12-03: qty 5

## 2013-12-03 MED ORDER — SCOPOLAMINE 1 MG/3DAYS TD PT72
MEDICATED_PATCH | TRANSDERMAL | Status: AC
Start: 2013-12-03 — End: 2013-12-03
  Filled 2013-12-03: qty 1

## 2013-12-03 MED ORDER — MAGNESIUM HYDROXIDE NICU ORAL SYRINGE 400 MG/5 ML
30.0000 mL | Freq: Two times a day (BID) | ORAL | Status: DC
  Filled 2013-12-03 (×4): qty 30

## 2013-12-03 MED ORDER — SCOPOLAMINE 1 MG/3DAYS TD PT72
1.0000 | MEDICATED_PATCH | TRANSDERMAL | Status: DC
  Administered 2013-12-03 – 2013-12-06 (×2): 1.5 mg via TRANSDERMAL
  Filled 2013-12-03 (×2): qty 1

## 2013-12-03 MED ORDER — MENTHOL 3 MG MT LOZG
1.0000 | LOZENGE | OROMUCOSAL | Status: DC | PRN
  Filled 2013-12-03 (×2): qty 9

## 2013-12-03 MED ORDER — MORPHINE SULFATE 2 MG/ML IJ SOLN
2.0000 mg | INTRAMUSCULAR | Status: DC | PRN
  Administered 2013-12-03 – 2013-12-06 (×20): 2 mg via INTRAVENOUS
  Filled 2013-12-03 (×20): qty 1

## 2013-12-03 NOTE — Progress Notes (Addendum)
Notified that patients nausea was still not controled. Patient vomited X3. MD was made aware. MD ordered an NG tube to be placed. Patient was made aware of the NG tube procedure and agreed to having it placed. Patient tolerated the NG tube placement well with no complications. Will continue to monitor the patient.

## 2013-12-03 NOTE — Progress Notes (Signed)
MEDICATION RELATED CONSULT NOTE - INITIAL   Pharmacy Consult for Phosphorus repletion Indication: hypophosphatemia  Allergies  Allergen Reactions  . Actonel [Risedronate Sodium] Other (See Comments)    Reaction=Heart burn  . Fosamax [Alendronate Sodium] Other (See Comments)    Reaction=Heart burn  . Lovaza [Omega-3-Acid Ethyl Esters] Other (See Comments)    Reaction=skin bruise   . Nsaids Swelling    Reaction=Swelling of face  . Cephalexin Itching and Rash  . Ciprofloxacin Itching and Rash    Patient Measurements: Height: 5' 1.5" (156.2 cm) Weight: 121 lb (54.885 kg) IBW/kg (Calculated) : 48.95  Vital Signs: Temp: 99.1 F (37.3 C) (02/08 0500) Temp src: Oral (02/08 0500) BP: 158/61 mmHg (02/08 0500) Pulse Rate: 103 (02/08 0500) Intake/Output from previous day: 02/07 0701 - 02/08 0700 In: 1502.5 [P.O.:720; I.V.:782.5] Out: 2050 [Urine:1850; Emesis/NG output:200] Intake/Output from this shift:    Labs:  Recent Labs  12/02/13 0526 12/03/13 0642  WBC 12.1* 10.9*  HGB 10.5* 11.4*  HCT 31.8* 33.9*  PLT 282 310  CREATININE 0.57 0.54  MG 1.4* 2.3  PHOS 3.0 1.5*   Estimated Creatinine Clearance: 49.2 ml/min (by C-G formula based on Cr of 0.54).   Microbiology: No results found for this or any previous visit (from the past 720 hour(s)).  Medical History: Past Medical History  Diagnosis Date  . Hyperlipidemia   . Hypertension   . IBS (irritable bowel syndrome)   . GERD (gastroesophageal reflux disease)   . Cataract   . Hemorrhoids   . Diverticulosis of colon (without mention of hemorrhage)   . Acute diverticulitis 10/07/2013    With microperforation.  [name] Kitchen. UTI (lower urinary tract infection) 10/07/2013  . PONV (postoperative nausea and vomiting)   . Osteopenia     Medications:  Scheduled:  . enoxaparin (LOVENOX) injection  40 mg Subcutaneous Q24H  . magnesium hydroxide  30 mL Oral BID  . metoCLOPramide (REGLAN) injection  10 mg Intravenous Q8H  .  pantoprazole  40 mg Oral Q1200  . potassium phosphate IVPB (mmol)  15 mmol Intravenous Once  . scopolamine  1 patch Transdermal Q72H    Assessment: 73 yo F s/p laproscopic partial colectomy on 12/01/13.  She has had persistent nausea post-op.  Phosphorus level is below goal, Potassium is at low end of goal range.    Goal of Therapy:  Phos 2.3-4.6  Plan:  KPhos 15mMol IV x 1 today F/U Bmet, Phos level tomorrow  Dorothy Pitts, Dorothy Pitts 12/03/2013,11:09 AM

## 2013-12-03 NOTE — Progress Notes (Addendum)
Notified by patient that she was very nauseous at the time. Patient vomited X1 less than 100cc. Patients abdomen was slightly distended and when palpated was mildly tight. MD was notified and he ordered morphine for the patient. MD stated to continue to monitor the patient. Patient is resting at this time will continue to monitor her.

## 2013-12-03 NOTE — Progress Notes (Signed)
Patients foley removed per protocol. Patient tolerated the removal well. Patient is expected to void by 1350 today. Will continue to monitor the patient.

## 2013-12-03 NOTE — Progress Notes (Signed)
Patient states that she is severely nauseous at this time. Patient has received Zofran and mylanta. MD notified. MD ordered phenergan at this time. Patient is resting, decrease in stimuli to patients room, and cool wash cloth is applied. Will continue to monitor the patient.

## 2013-12-03 NOTE — Anesthesia Postprocedure Evaluation (Signed)
  Anesthesia Post-op Note  Patient: Dorothy Pitts  Procedure(s) Performed: Procedure(s): HAND ASSISTED LAPAROSCOPIC PARTIAL COLECTOMY (N/A)  Patient Location: Room 336  Anesthesia Type:General  Level of Consciousness: awake, alert , oriented and patient cooperative  Airway and Oxygen Therapy: Patient Spontanous Breathing  Post-op Pain: mild  Post-op Assessment: Post-op Vital signs reviewed, Patient's Cardiovascular Status Stable, Respiratory Function Stable, Patent Airway, NAUSEA AND VOMITING PRESENT and Pain level controlled  Post-op Vital Signs: Reviewed and stable  Complications: No apparent anesthesia complications

## 2013-12-03 NOTE — Progress Notes (Signed)
Patient has not voided this shift,Dorothy Pitts has attempted,Dr Lovell SheehanJenkins notified.Will continue to monitor patient.Family at bedside.

## 2013-12-03 NOTE — Progress Notes (Signed)
Patient is having no relief from nausea at this time. Patient has received morphine and ativan. Also peppermint has been placed in the patients room. MD was notified. MD ordered milk of magnesia, Reglan, and a scopolamine patch. Will continue to assess patient at this time.

## 2013-12-03 NOTE — Progress Notes (Signed)
2 Days Post-Op  Subjective: Do to ongoing nausea, an NG tube was placed. The patient states she is still little nauseated, but improved.  Objective: Vital signs in last 24 hours: Temp:  [98.5 F (36.9 C)-99.1 F (37.3 C)] 99.1 F (37.3 C) (02/08 0500) Pulse Rate:  [83-103] 103 (02/08 0500) Resp:  [16-20] 18 (02/08 0500) BP: (127-158)/(61-68) 158/61 mmHg (02/08 0500) SpO2:  [97 %] 97 % (02/08 0500) Last BM Date: 12/01/13  Intake/Output from previous day: 02/07 0701 - 02/08 0700 In: 1502.5 [P.O.:720; I.V.:782.5] Out: 2050 [Urine:1850; Emesis/NG output:200] Intake/Output this shift:    General appearance: alert, cooperative and fatigued Resp: clear to auscultation bilaterally Cardio: regular rate and rhythm, S1, S2 normal, no murmur, click, rub or gallop GI: Soft, slightly distended. Incisions healing well. Minimal bowel sounds appreciated.  Lab Results:   Recent Labs  12/02/13 0526 12/03/13 0642  WBC 12.1* 10.9*  HGB 10.5* 11.4*  HCT 31.8* 33.9*  PLT 282 310   BMET  Recent Labs  12/02/13 0526 12/03/13 0642  NA 137 140  K 4.1 3.7  CL 100 96  [name] 27 37*  GLUCOSE 127* 173*  BUN 13 10  CREATININE 0.57 0.54  CALCIUM 8.5 9.0   PT/INR No results found for this basename: LABPROT, INR,  in the last 72 hours  Studies/Results: No results found.  Anti-infectives: Anti-infectives   Start     Dose/Rate Route Frequency Ordered Stop   12/01/13 0630  clindamycin (CLEOCIN) IVPB 900 mg     900 mg 100 mL/hr over 30 Minutes Intravenous  Once 12/01/13 0625 12/01/13 0742   12/01/13 0625  metroNIDAZOLE (FLAGYL) IVPB 500 mg     500 mg 100 mL/hr over 60 Minutes Intravenous On call to O.R. 12/01/13 [id number] 12/01/13 0759      Assessment/Plan: s/p Procedure(s): HAND ASSISTED LAPAROSCOPIC PARTIAL COLECTOMY  impression: Postoperative day 2, persistent nausea probably secondary to surgery, anesthesia, and medications. Is getting relief with NG tube. We'll continue gastric  decompression for now. Will treat hypophosphatemia. Hypomagnesemia resolved. Discussed plan with both patient and daughter.  LOS: 2 days    Chantz Montefusco A 12/03/2013

## 2013-12-04 ENCOUNTER — Inpatient Hospital Stay (HOSPITAL_COMMUNITY): Payer: Medicare Other

## 2013-12-04 ENCOUNTER — Encounter (HOSPITAL_COMMUNITY): Payer: Self-pay | Admitting: General Surgery

## 2013-12-04 LAB — BASIC METABOLIC PANEL
BUN: 18 mg/dL (ref 6–23)
CO2: 34 meq/L — AB (ref 19–32)
Calcium: 8.4 mg/dL (ref 8.4–10.5)
Chloride: 96 mEq/L (ref 96–112)
Creatinine, Ser: 0.57 mg/dL (ref 0.50–1.10)
GFR calc Af Amer: >90 mL/min (ref >90)
GLUCOSE: 151 mg/dL — AB (ref 70–99)
POTASSIUM: 3.6 meq/L — AB (ref 3.7–5.3)
SODIUM: 139 meq/L (ref 137–147)

## 2013-12-04 LAB — CBC
HCT: 31.3 % — ABNORMAL LOW (ref 36.0–46.0)
HEMOGLOBIN: 10.4 g/dL — AB (ref 12.0–15.0)
MCH: 31.8 pg (ref 26.0–34.0)
MCHC: 33.2 g/dL (ref 30.0–36.0)
MCV: 95.7 fL (ref 78.0–100.0)
Platelets: 324 10*3/uL (ref 150–400)
RBC: 3.27 MIL/uL — AB (ref 3.87–5.11)
RDW: 13 % (ref 11.5–15.5)
WBC: 9.7 10*3/uL (ref 4.0–10.5)

## 2013-12-04 LAB — MAGNESIUM: Magnesium: 2.4 mg/dL (ref 1.5–2.5)

## 2013-12-04 LAB — PHOSPHORUS: Phosphorus: 3 mg/dL (ref 2.3–4.6)

## 2013-12-04 MED ORDER — SODIUM CHLORIDE 0.9 % IV BOLUS (SEPSIS)
500.0000 mL | Freq: Once | INTRAVENOUS | Status: AC
  Administered 2013-12-04: 500 mL via INTRAVENOUS

## 2013-12-04 MED ORDER — ACETAMINOPHEN 325 MG RE SUPP: 325.0000 mg | Freq: Four times a day (QID) | RECTAL | Status: DC | PRN

## 2013-12-04 MED ORDER — PANTOPRAZOLE SODIUM 40 MG IV SOLR
40.0000 mg | INTRAVENOUS | Status: DC
  Administered 2013-12-04 – 2013-12-06 (×3): 40 mg via INTRAVENOUS
  Filled 2013-12-04 (×3): qty 40

## 2013-12-04 NOTE — Progress Notes (Signed)
MEDICATION RELATED CONSULT NOTE   Pharmacy Consult for Phosphorus repletion Indication: hypophosphatemia  Allergies  Allergen Reactions  . Actonel [Risedronate Sodium] Other (See Comments)    Reaction=Heart burn  . Fosamax [Alendronate Sodium] Other (See Comments)    Reaction=Heart burn  . Lovaza [Omega-3-Acid Ethyl Esters] Other (See Comments)    Reaction=skin bruise   . Nsaids Swelling    Reaction=Swelling of face  . Cephalexin Itching and Rash  . Ciprofloxacin Itching and Rash    Patient Measurements: Height: 5' 1.5" (156.2 cm) Weight: 121 lb (54.885 kg) IBW/kg (Calculated) : 48.95  Vital Signs: BP: 144/57 mmHg (02/09 1100) Pulse Rate: 97 (02/09 1100) Intake/Output from previous day: 02/08 0701 - 02/09 0700 In: 2065 [I.V.:1810; IV Piggyback:255] Out: 600 [Emesis/NG output:600] Intake/Output from this shift:    Labs:  Recent Labs  12/02/13 0526 12/03/13 0642 12/04/13 0625  WBC 12.1* 10.9* 9.7  HGB 10.5* 11.4* 10.4*  HCT 31.8* 33.9* 31.3*  PLT 282 310 324  CREATININE 0.57 0.54 0.57  MG 1.4* 2.3 2.4  PHOS 3.0 1.5* 3.0   Estimated Creatinine Clearance: 49.2 ml/min (by C-G formula based on Cr of 0.57).   Microbiology: No results found for this or any previous visit (from the past 720 hour(s)).  Medical History: Past Medical History  Diagnosis Date  . Hyperlipidemia   . Hypertension   . IBS (irritable bowel syndrome)   . GERD (gastroesophageal reflux disease)   . Cataract   . Hemorrhoids   . Diverticulosis of colon (without mention of hemorrhage)   . Acute diverticulitis 10/07/2013    With microperforation.  [name] Kitchen. UTI (lower urinary tract infection) 10/07/2013  . PONV (postoperative nausea and vomiting)   . Osteopenia     Medications:  Scheduled:  . enoxaparin (LOVENOX) injection  40 mg Subcutaneous Q24H  . magnesium hydroxide  30 mL Oral BID  . metoCLOPramide (REGLAN) injection  10 mg Intravenous Q8H  . pantoprazole  40 mg Oral Q1200  .  scopolamine  1 patch Transdermal Q72H    Assessment: 73 yo F s/p laproscopic partial colectomy on 12/01/13.  She has had persistent nausea post-op.  15mMol KPhos was given yesterday for low Phos & borderline low potassium levels.  Both have returned to normal range today.    Goal of Therapy:  Phos 2.3-4.6  Plan:  No further repletion needed at this time Pharmacy to sign off.  Please re-consult if needed.   Elson ClanLilliston, Andrea Michelle 12/04/2013,11:24 AM

## 2013-12-04 NOTE — Progress Notes (Signed)
3 Days Post-Op  Subjective: Patient would like NG tube removed. She is yet to have a good bowel movement. Minimal abdominal pain is noted.  Objective: Vital signs in last 24 hours: Temp:  [98.9 F (37.2 C)-99.7 F (37.6 C)] 98.9 F (37.2 C) (02/08 2231) Pulse Rate:  [93-104] 93 (02/08 2231) Resp:  [18-21] 21 (02/08 2231) BP: (151-166)/(71-76) 156/71 mmHg (02/08 2231) SpO2:  [97 %-99 %] 97 % (02/08 2231) Last BM Date: 12/01/13  Intake/Output from previous day: 02/08 0701 - 02/09 0700 In: 2065 [I.V.:1810; IV Piggyback:255] Out: 600 [Emesis/NG output:600] Intake/Output this shift:    General appearance: alert, cooperative and no distress Resp: clear to auscultation bilaterally Cardio: regular rate and rhythm, S1, S2 normal, no murmur, click, rub or gallop GI: Soft, but still mildly distended, but not tense. Incisions healing well. Minimal bowel sounds appreciated.  Lab Results:   Recent Labs  12/03/13 0642 12/04/13 0625  WBC 10.9* 9.7  HGB 11.4* 10.4*  HCT 33.9* 31.3*  PLT 310 324   BMET  Recent Labs  12/03/13 0642 12/04/13 0625  NA 140 139  K 3.7 3.6*  CL 96 96  [name] 37* 34*  GLUCOSE 173* 151*  BUN 10 18  CREATININE 0.54 0.57  CALCIUM 9.0 8.4   PT/INR No results found for this basename: LABPROT, INR,  in the last 72 hours  Studies/Results: No results found.  Anti-infectives: Anti-infectives   Start     Dose/Rate Route Frequency Ordered Stop   12/01/13 0630  clindamycin (CLEOCIN) IVPB 900 mg     900 mg 100 mL/hr over 30 Minutes Intravenous  Once 12/01/13 0625 12/01/13 0742   12/01/13 0625  metroNIDAZOLE (FLAGYL) IVPB 500 mg     500 mg 100 mL/hr over 60 Minutes Intravenous On call to O.R. 12/01/13 [id number] 12/01/13 0759      Assessment/Plan: s/p Procedure(s): HAND ASSISTED LAPAROSCOPIC PARTIAL COLECTOMY Impression: Postoperative day 3, status post left hemicolectomy. Bowel function still has not returned. Her nausea is much better with the NG tube in  place. We'll get a KUB. Her labs are within normal limits. Her hypophosphatemia has resolved. Will continue to follow expectantly.  LOS: 3 days    Manly Nestle A 12/04/2013

## 2013-12-04 NOTE — Progress Notes (Signed)
Patient up in chair resting quietly at this time. Son at bedside. No complaints voiced at this time.

## 2013-12-04 NOTE — Progress Notes (Signed)
IV removed. Discharge instructions reviewed with patient. Understanding verbalized. Ready for discharge home 

## 2013-12-04 NOTE — Progress Notes (Signed)
Patient returned from xray. NGT placement checked. Hooked back up to intermittent suction. Small amount of bright red blood return noted. Patient voided to bedside commode 100ml. Bladder scan showed 51ml. Patient concerned about blood pressure medications. Dr. Lovell SheehanJenkins paged about these issues.

## 2013-12-05 MED ORDER — PROMETHAZINE HCL 25 MG/ML IJ SOLN
6.2500 mg | Freq: Four times a day (QID) | INTRAMUSCULAR | Status: DC | PRN
  Administered 2013-12-05 – 2013-12-06 (×3): 6.25 mg via INTRAVENOUS
  Filled 2013-12-05 (×3): qty 1

## 2013-12-05 MED ORDER — SIMETHICONE 80 MG PO CHEW: 80.0000 mg | CHEWABLE_TABLET | ORAL | Status: DC | PRN

## 2013-12-05 MED ORDER — MAGNESIUM HYDROXIDE 400 MG/5ML PO SUSP
30.0000 mL | Freq: Two times a day (BID) | ORAL | Status: DC
  Administered 2013-12-05 – 2013-12-07 (×6): 30 mL via ORAL
  Filled 2013-12-05 (×6): qty 30

## 2013-12-05 MED ORDER — OXYCODONE-ACETAMINOPHEN 5-325 MG PO TABS
1.0000 | ORAL_TABLET | Freq: Four times a day (QID) | ORAL | Status: DC | PRN
  Administered 2013-12-05 – 2013-12-07 (×4): 1 via ORAL
  Administered 2013-12-07: 2 via ORAL
  Administered 2013-12-07 – 2013-12-08 (×2): 1 via ORAL
  Filled 2013-12-05: qty 2
  Filled 2013-12-05 (×2): qty 1
  Filled 2013-12-05: qty 2
  Filled 2013-12-05 (×2): qty 1

## 2013-12-05 NOTE — Progress Notes (Signed)
4 Days Post-Op  Subjective: Feels much better. Has had some flatus. Nausea resolved.  Objective: Vital signs in last 24 hours: Temp:  [98.3 F (36.8 C)-99.4 F (37.4 C)] 98.3 F (36.8 C) (02/10 0545) Pulse Rate:  [84-97] 85 (02/10 0545) Resp:  [20] 20 (02/10 0545) BP: (115-144)/(49-57) 125/51 mmHg (02/10 0545) SpO2:  [97 %-100 %] 100 % (02/10 0545) Last BM Date: 12/01/13  Intake/Output from previous day: 02/09 0701 - 02/10 0700 In: 3339.6 [I.V.:2839.6; IV Piggyback:500] Out: 650 [Urine:650] Intake/Output this shift:    General appearance: alert, cooperative and no distress Resp: clear to auscultation bilaterally Cardio: regular rate and rhythm, S1, S2 normal, no murmur, click, rub or gallop GI: Soft. Bowel sounds appreciated. Incisions healing well.  Lab Results:   Recent Labs  12/03/13 0642 12/04/13 0625  WBC 10.9* 9.7  HGB 11.4* 10.4*  HCT 33.9* 31.3*  PLT 310 324   BMET  Recent Labs  12/03/13 0642 12/04/13 0625  NA 140 139  K 3.7 3.6*  CL 96 96  [name] 37* 34*  GLUCOSE 173* 151*  BUN 10 18  CREATININE 0.54 0.57  CALCIUM 9.0 8.4   PT/INR No results found for this basename: LABPROT, INR,  in the last 72 hours  Studies/Results: Dg Abd 1 View  12/04/2013   CLINICAL DATA:  Postoperative ileus  EXAM: ABDOMEN - 1 VIEW  COMPARISON:  01/12/2013, CT abdomen and pelvis 10/07/2013  FINDINGS: Scattered skin clips from prior surgery.  Air-filled loops of mildly distended small bowel are seen throughout abdomen which could represent a postoperative ileus.  Minimal colonic gas.  No bowel wall thickening.  Small left pelvic phlebolith.  Bones unremarkable.  IMPRESSION: Air-filled distended loops of small bowel throughout abdomen consistent with postoperative ileus.   Electronically Signed   By: Ulyses SouthwardMark  Boles M.D.   On: 12/04/2013 11:00    Anti-infectives: Anti-infectives   Start     Dose/Rate Route Frequency Ordered Stop   12/01/13 0630  clindamycin (CLEOCIN) IVPB 900 mg      900 mg 100 mL/hr over 30 Minutes Intravenous  Once 12/01/13 0625 12/01/13 0742   12/01/13 0625  metroNIDAZOLE (FLAGYL) IVPB 500 mg     500 mg 100 mL/hr over 60 Minutes Intravenous On call to O.R. 12/01/13 [id number] 12/01/13 0759      Assessment/Plan: s/p Procedure(s): HAND ASSISTED LAPAROSCOPIC PARTIAL COLECTOMY Impression: Postoperative day 4, bowel function starting to return. Were removed NG tube. We'll start full liquid diet. Encourage ambulation. Patient voiding without difficulty.  LOS: 4 days    Dorothy Pitts A 12/05/2013

## 2013-12-05 NOTE — Care Management Note (Signed)
    Page 1 of 1   12/05/2013     3:43:36 PM   CARE MANAGEMENT NOTE 12/05/2013  Patient:  Dorothy Pitts,Dorothy Pitts   Account Number:  [id number]  Date Initiated:  12/05/2013  Documentation initiated by:  Rosemary HolmsOBSON,Jatavius Ellenwood  Subjective/Objective Assessment:   Pt lives at home with her daughter. She is very independent with ADL. No HH or DME needs identified.     Action/Plan:   Anticipated DC Date:  12/06/2013   Anticipated DC Plan:  HOME/SELF CARE      DC Planning Services  CM consult      Choice offered to / List presented to:             Status of service:  Completed, signed off Medicare Important Message given?   (If response is "NO", the following Medicare IM given date fields will be blank) Date Medicare IM given:   Date Additional Medicare IM given:    Discharge Disposition:    Per UR Regulation:    If discussed at Long Length of Stay Meetings, dates discussed:    Comments:  12/05/13 Rosemary HolmsAmy Korra Christine RN BSN CM

## 2013-12-05 NOTE — Progress Notes (Signed)
Pt ambulated in hallway with standby assist from daughter. Ambulated approximately 400 feet. Tolerated well. No c/o increased pain or SOB.

## 2013-12-05 NOTE — Progress Notes (Signed)
At approximately 1900, pt's daughter states "she really sick again".  Zofran PRN given around 1700. Ordered for q 6 hours PRN.  Dr. Lovell SheehanJenkins paged and made aware. Emesis noted, bile/brown, approximately 200 cc's.  Dr. Lovell SheehanJenkins returned page. New orders for Mylicon and Phenergan.  Pt also made ice chips only. Dr. Lovell SheehanJenkins stated would prefer to not replace NGT if at all possible. Will make night shift RN aware.

## 2013-12-05 NOTE — Progress Notes (Signed)
Pt ambulating in hallway with standby assist from daughter. Pt ambulated approximately 300 feet. Tolerated well. C/o minimal abdominal pain/discomfort with ambulation.

## 2013-12-06 LAB — CBC
HCT: 29.7 % — ABNORMAL LOW (ref 36.0–46.0)
HEMOGLOBIN: 10.1 g/dL — AB (ref 12.0–15.0)
MCH: 32.8 pg (ref 26.0–34.0)
MCHC: 34 g/dL (ref 30.0–36.0)
MCV: 96.4 fL (ref 78.0–100.0)
Platelets: 344 10*3/uL (ref 150–400)
RBC: 3.08 MIL/uL — AB (ref 3.87–5.11)
RDW: 12.7 % (ref 11.5–15.5)
WBC: 6.4 10*3/uL (ref 4.0–10.5)

## 2013-12-06 LAB — BASIC METABOLIC PANEL
BUN: 10 mg/dL (ref 6–23)
CO2: 28 mEq/L (ref 19–32)
Calcium: 8.5 mg/dL (ref 8.4–10.5)
Chloride: 98 mEq/L (ref 96–112)
Creatinine, Ser: 0.53 mg/dL (ref 0.50–1.10)
GLUCOSE: 115 mg/dL — AB (ref 70–99)
POTASSIUM: 4.2 meq/L (ref 3.7–5.3)
SODIUM: 137 meq/L (ref 137–147)

## 2013-12-06 MED ORDER — PANTOPRAZOLE SODIUM 40 MG PO TBEC
40.0000 mg | DELAYED_RELEASE_TABLET | Freq: Every day | ORAL | Status: DC
  Administered 2013-12-07 – 2013-12-08 (×2): 40 mg via ORAL
  Filled 2013-12-06 (×2): qty 1

## 2013-12-06 NOTE — Progress Notes (Signed)
5 Days Post-Op  Subjective: Feels better this morning. Did have a bowel movement.  Objective: Vital signs in last 24 hours: Temp:  [98.4 F (36.9 C)-98.7 F (37.1 C)] 98.4 F (36.9 C) (02/11 0646) Pulse Rate:  [88-90] 88 (02/11 0646) Resp:  [20] 20 (02/11 0646) BP: (142-153)/(67-78) 153/67 mmHg (02/11 0646) SpO2:  [96 %-99 %] 99 % (02/11 0646) Last BM Date: 12/06/13  Intake/Output from previous day: 02/10 0701 - 02/11 0700 In: 2217.1 [P.O.:240; I.V.:1977.1] Out: 1100 [Urine:1100] Intake/Output this shift:    General appearance: alert, cooperative and no distress Resp: clear to auscultation bilaterally Cardio: regular rate and rhythm, S1, S2 normal, no murmur, click, rub or gallop GI: Soft, less distended. Incision healing well. Bowel sounds appreciated.  Lab Results:   Recent Labs  12/04/13 0625 12/06/13 0523  WBC 9.7 6.4  HGB 10.4* 10.1*  HCT 31.3* 29.7*  PLT 324 344   BMET  Recent Labs  12/04/13 0625 12/06/13 0523  NA 139 137  K 3.6* 4.2  CL 96 98  [name] 34* 28  GLUCOSE 151* 115*  BUN 18 10  CREATININE 0.57 0.53  CALCIUM 8.4 8.5   PT/INR No results found for this basename: LABPROT, INR,  in the last 72 hours  Studies/Results: No results found.  Anti-infectives: Anti-infectives   Start     Dose/Rate Route Frequency Ordered Stop   12/01/13 0630  clindamycin (CLEOCIN) IVPB 900 mg     900 mg 100 mL/hr over 30 Minutes Intravenous  Once 12/01/13 0625 12/01/13 0742   12/01/13 0625  metroNIDAZOLE (FLAGYL) IVPB 500 mg     500 mg 100 mL/hr over 60 Minutes Intravenous On call to O.R. 12/01/13 [id number] 12/01/13 0759      Assessment/Plan: s/p Procedure(s): HAND ASSISTED LAPAROSCOPIC PARTIAL COLECTOMY Impression: Postoperative day 5, resolving bowel ileus. Milligrams diet as tolerated. Encourage patient to continue ambulating.  LOS: 5 days    Dorothy Pitts A 12/06/2013

## 2013-12-07 MED ORDER — ALUM & MAG HYDROXIDE-SIMETH 200-200-20 MG/5ML PO SUSP
30.0000 mL | Freq: Four times a day (QID) | ORAL | Status: DC | PRN
  Administered 2013-12-07 – 2013-12-08 (×3): 30 mL via ORAL
  Filled 2013-12-07 (×3): qty 30

## 2013-12-07 NOTE — Progress Notes (Signed)
6 Days Post-Op  Subjective: Had 2 bowel movements yesterday. Nausea seems to be resolved.  Objective: Vital signs in last 24 hours: Temp:  [97.8 F (36.6 C)-99 F (37.2 C)] 98 F (36.7 C) (02/12 0548) Pulse Rate:  [69-87] 69 (02/12 0548) Resp:  [20] 20 (02/12 0548) BP: (117-145)/(53-76) 117/53 mmHg (02/12 0548) SpO2:  [96 %-99 %] 99 % (02/12 0548) Last BM Date: 12/06/13  Intake/Output from previous day: 02/11 0701 - 02/12 0700 In: 60 [P.O.:60] Out: -  Intake/Output this shift:    General appearance: alert, cooperative and no distress Resp: clear to auscultation bilaterally Cardio: regular rate and rhythm, S1, S2 normal, no murmur, click, rub or gallop GI: Soft, mildly distended but soft. Incisions healing well. Bowel sounds appreciated.  Lab Results:   Recent Labs  12/06/13 0523  WBC 6.4  HGB 10.1*  HCT 29.7*  PLT 344   BMET  Recent Labs  12/06/13 0523  NA 137  K 4.2  CL 98  [name] 28  GLUCOSE 115*  BUN 10  CREATININE 0.53  CALCIUM 8.5   PT/INR No results found for this basename: LABPROT, INR,  in the last 72 hours  Studies/Results: No results found.  Anti-infectives: Anti-infectives   Start     Dose/Rate Route Frequency Ordered Stop   12/01/13 0630  clindamycin (CLEOCIN) IVPB 900 mg     900 mg 100 mL/hr over 30 Minutes Intravenous  Once 12/01/13 0625 12/01/13 0742   12/01/13 0625  metroNIDAZOLE (FLAGYL) IVPB 500 mg     500 mg 100 mL/hr over 60 Minutes Intravenous On call to O.R. 12/01/13 [id number] 12/01/13 0759      Assessment/Plan: s/p Procedure(s): HAND ASSISTED LAPAROSCOPIC PARTIAL COLECTOMY Impression: Postoperative day 6. Bowel function has returned. We'll advance to regular diet. Anticipate discharge in next 2448 hours if patient tolerates regular diet well.  LOS: 6 days    Seneca Gadbois A 12/07/2013

## 2013-12-08 MED ORDER — HYDROCODONE-ACETAMINOPHEN 5-325 MG PO TABS: 1.0000 | ORAL_TABLET | ORAL | Status: DC | PRN

## 2013-12-08 NOTE — Progress Notes (Signed)
Nutrition Brief Note  RD pulled to chart due to LOS  Wt Readings from Last 15 Encounters:  12/01/13 121 lb (54.885 kg)  12/01/13 121 lb (54.885 kg)  11/29/13 121 lb (54.885 kg)  11/27/13 120 lb 6.4 oz (54.613 kg)  10/07/13 125 lb 4.8 oz (56.836 kg)  08/03/13 121 lb (54.885 kg)  01/27/13 116 lb 8 oz (52.844 kg)  01/12/13 120 lb (54.432 kg)  09/09/12 122 lb (55.339 kg)  08/29/12 122 lb 1.6 oz (55.384 kg)  09/08/10 119 lb (53.978 kg)    Body mass index is 22.5 kg/(m^2). Patient meets criteria for normal weight based on current BMI.   Current diet order is regular, patient is consuming approximately 50-100% of meals at this time. Labs and medications reviewed.   No nutrition interventions warranted at this time. If nutrition issues arise, please consult RD.   Jacque Garrels A. Mayford KnifeWilliams, RD, LDN Pager: [phone]

## 2013-12-08 NOTE — Progress Notes (Signed)
12/08/13 1250 Reviewed discharge instructions with patient. Given copy of AVS, medication list, prescription, f/u appointment information. Pt states will call office for appointment time as instructed. Pt requested prescription for nausea medication. Notified MD. Prescription provided per Dr. Lovell SheehanJenkins. Pt aware [facility] [place] [facility] [place]. Pt verbalized understanding of instructions, incision monitoring, when to call MD. No c/o pain or discomfort at this time. IV site d/c'd per nurse tech, site within normal limits. Pt in stable condition awaiting discharge home. Earnstine RegalAshley Donnia Poplaski, RN

## 2013-12-08 NOTE — Discharge Summary (Signed)
Physician Discharge Summary  Patient ID: Clearance CootsJudith M Dowdy MRN: [id number] DOB/AGE: 1941-06-25 73 y.o.  Admit date: 12/01/2013 Discharge date: 12/08/2013  Admission Diagnoses: History of diverticulitis, descending colon  Discharge Diagnoses: Same Active Problems:   Diverticulitis large intestine   Discharged Condition: good  Hospital Course: Patient is a 73 year old white female who [facility] for a laparoscopic hand-assisted partial colectomy. She had a history of recurrent diverticulitis. She tolerated the procedure well. Postoperative course was remarkable for mild delay in return of her bowel function. He did finally returned and her diet was advanced at difficulty. Final pathology revealed acute on chronic diverticulitis. She is being discharged home in good improving condition.  Treatments: surgery: Laparoscopic hand-assisted partial colectomy on 12/01/2013  Discharge Exam: Blood pressure 135/55, pulse 73, temperature 98.4 F (36.9 C), temperature source Oral, resp. rate 20, height 5' 1.5" (1.562 m), weight 54.885 kg (121 lb), SpO2 99.00%. General appearance: alert, cooperative and no distress Resp: clear to auscultation bilaterally Cardio: regular rate and rhythm, S1, S2 normal, no murmur, click, rub or gallop GI: Soft. Incisions healing well. Bowel sounds active.  Disposition: 01-Home or Self Care     Medication List         enalapril 5 MG tablet  Commonly known as:  VASOTEC  TAKE 1 TABLET BY MOUTH EVERY DAY AS DIRECTED     enalapril 5 MG tablet  Commonly known as:  VASOTEC  Take 5 mg by mouth every morning.     HYDROcodone-acetaminophen 5-325 MG per tablet  Commonly known as:  NORCO/VICODIN  Take 1 tablet by mouth every 4 (four) hours as needed for moderate pain.     Magnesium Malate Powd  Take by mouth daily. Uses 833mg  tablet, not powder.     multivitamin tablet  Take 1 tablet by mouth daily.     PRESERVISION AREDS 2 PO  Take by  mouth 2 (two) times daily.     raloxifene 60 MG tablet  Commonly known as:  EVISTA  Take 60 mg by mouth every morning.     ranitidine 150 MG tablet  Commonly known as:  ZANTAC  Take 150 mg by mouth 2 (two) times daily.     spironolactone 50 MG tablet  Commonly known as:  ALDACTONE  Take 50 mg by mouth every morning.     vitamin C 1000 MG tablet  Take 1,000 mg by mouth daily.     Vitamin D3 2000 UNITS Tabs  Take 1 tablet by mouth daily.     zolpidem 5 MG tablet  Commonly known as:  AMBIEN  TAKE 1 TABLET BY MOUTH AT BEDTIME AS NEEDED           Follow-up Information   Follow up with Dalia HeadingJENKINS,Peyton Spengler A, MD. Schedule an appointment as soon as possible for a visit on 12/12/2013.   Specialty:  General Surgery   Contact information:   [address] [place] [id number] [phone]       Signed: Franky MachoJENKINS,Lycia Sachdeva A 12/08/2013, 12:19 PM

## 2013-12-08 NOTE — Discharge Instructions (Signed)
Laparoscopic Colectomy, Care After Refer to this sheet in the next few weeks. These instructions provide you with information on caring for yourself after your procedure. Your health care provider may also give you more specific instructions. Your treatment has been planned according to current medical practices, but problems sometimes occur. Call your health care provider if you have any problems or questions after your procedure. WHAT TO EXPECT AFTER THE PROCEDURE After your procedure, it is typical to have the following:  Pain in your abdomen, especially at the incision sites. You will be given pain medicine to control the pain.  Tiredness. This is a normal part of the recovery process. Your energy level will return to normal over the next several weeks.  Constipation. You may be given stool softeners to prevent this. HOME CARE INSTRUCTIONS   Only take over-the-counter or prescription medicines as directed by your health care provider.  Ask your health care provider whether you may take a shower when you go home.  Remove or change any bandages (dressings) as directed.  You may resume a normal diet and activities as directed. Eat plenty of fruits and vegetables to help prevent constipation.  Drink enough fluids to keep your urine clear or pale yellow. This also helps prevent constipation.  Take rest breaks during the day as needed.  Avoid lifting anything heavier than 25 pounds (11.3 kg) or driving for 4 weeks or until your health care provider says it is okay.  Follow up with your health care provider as directed. Ask your health care provider when to make an appointment to get your stitches or staples removed. SEEK MEDICAL CARE IF:   You have increased bleeding from the incision areas.  You have redness, swelling, or increasing pain in the wounds.  You see pus coming from a wound.  You have a fever.  You notice a foul smell coming from the wound or dressing.  Your wound is  breaking open (edges not staying together) after sutures or staples have been removed. SEEK IMMEDIATE MEDICAL CARE IF:  You develop a rash.  You have chest pain or difficulty breathing.  You have pain or swelling in your legs.  You have lightheadedness or feel faint.  Your abdomen becomes larger (distended).  You have nausea or vomiting.  You have blood in your stools. Document Released: 05/01/2005 Document Revised: 08/02/2013 Document Reviewed: 05/24/2013 Exit[employer] Patient Information 2014 [employer].  

## 2014-01-19 ENCOUNTER — Other Ambulatory Visit: Payer: Self-pay | Admitting: Family Medicine

## 2014-03-26 ENCOUNTER — Encounter: Payer: Self-pay | Admitting: Family Medicine

## 2014-03-26 ENCOUNTER — Telehealth: Payer: Self-pay | Admitting: Family Medicine

## 2014-03-26 ENCOUNTER — Ambulatory Visit (INDEPENDENT_AMBULATORY_CARE_PROVIDER_SITE_OTHER): Payer: Medicare Other

## 2014-03-26 ENCOUNTER — Ambulatory Visit (INDEPENDENT_AMBULATORY_CARE_PROVIDER_SITE_OTHER): Payer: Medicare Other | Admitting: Family Medicine

## 2014-03-26 VITALS — BP 110/60 | HR 69 | Temp 99.0°F | Ht 61.5 in | Wt 115.0 lb

## 2014-03-26 DIAGNOSIS — M79672 Pain in left foot: Secondary | ICD-10-CM

## 2014-03-26 DIAGNOSIS — M79609 Pain in unspecified limb: Secondary | ICD-10-CM

## 2014-03-26 NOTE — Progress Notes (Signed)
Subjective:    Patient ID: Dorothy Pitts, female    DOB: 03-Dec-1940, 73 y.o.   MRN: [id number]  HPI Patient here today for left foot pain. This has been going on for over a month.        Patient Active Problem List   Diagnosis Date Noted  . Diverticulitis large intestine 12/01/2013  . Hypokalemia 10/08/2013  . Anemia 10/08/2013  . Acute diverticulitis 10/07/2013  . 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 N&V (nausea and vomiting) 10/07/2013  . Abdominal pain, acute, left lower quadrant 10/07/2013  . UTI (lower urinary tract infection) 10/07/2013  . Diarrhea 10/07/2013  . Diverticulitis 10/07/2013  . Diverticulosis of colon (without mention of hemorrhage)   . Hypertension   . IBS (irritable bowel syndrome)    Outpatient Encounter Prescriptions as of 03/26/2014  Medication Sig  . Ascorbic Acid (VITAMIN C) 1000 MG tablet Take 1,000 mg by mouth daily.   . Cholecalciferol (VITAMIN D3) 2000 UNITS TABS Take 1 tablet by mouth daily.  . enalapril (VASOTEC) 5 MG tablet TAKE 1 TABLET BY MOUTH EVERY DAY AS DIRECTED  . Magnesium Malate POWD Take by mouth daily. Uses 833mg  tablet, not powder.  . Multiple Vitamin (MULTIVITAMIN) tablet Take 1 tablet by mouth daily.  . Multiple Vitamins-Minerals (PRESERVISION AREDS 2 PO) Take by mouth 2 (two) times daily.  . raloxifene (EVISTA) 60 MG tablet TAKE 1 TABLET BY MOUTH ONCE A DAY  . ranitidine (ZANTAC) 150 MG tablet Take 150 mg by mouth 2 (two) times daily.  [name] Kitchen spironolactone (ALDACTONE) 50 MG tablet Take 50 mg by mouth every morning.  . zolpidem (AMBIEN) 5 MG tablet TAKE 1 TABLET BY MOUTH AT BEDTIME AS NEEDED  . [DISCONTINUED] enalapril (VASOTEC) 5 MG tablet Take 5 mg by mouth every morning.  . [DISCONTINUED] HYDROcodone-acetaminophen (NORCO/VICODIN) 5-325 MG per tablet Take 1 tablet by mouth every 4 (four) hours as needed for moderate pain.  . [DISCONTINUED] raloxifene (EVISTA) 60 MG tablet Take 60 mg by mouth every morning.    Review of  Systems  Constitutional: Negative.   HENT: Negative.   Eyes: Negative.   Respiratory: Negative.   Cardiovascular: Negative.   Gastrointestinal: Negative.   Endocrine: Negative.   Genitourinary: Negative.   Musculoskeletal: Positive for arthralgias (left foot pain).  Skin: Negative.   Allergic/Immunologic: Negative.   Neurological: Negative.   Hematological: Negative.   Psychiatric/Behavioral: Negative.        Objective:   Physical Exam  Constitutional: She is oriented to person, place, and time. She appears well-developed and well-nourished. No distress.  Cardiovascular: Intact distal pulses.   Musculoskeletal: Normal range of motion. She exhibits tenderness. She exhibits no edema.  Some tenderness left lateral dorsal foot. Some discoloration bilaterally in both feet which appear to be varicosities in this same area. There were good pedal pulses bilaterally  Neurological: She is alert and oriented to person, place, and time.  Skin: Skin is warm and dry. No rash noted. No erythema. No pallor.  Psychiatric: She has a normal mood and affect. Her behavior is normal. Judgment and thought content normal.   BP 110/60  Pulse 69  Temp(Src) 99 F (37.2 C) (Oral)  Ht 5' 1.5" (1.562 m)  Wt 115 lb (52.164 kg)  BMI 21.38 kg/m2 [facility] reading (PRIMARY) by  Dr.Brittanee Ghazarian-left ankle and dorsal foot--minimal degenerative change  The patient's abdominal incision was healing well secondary to the partial colectomy from the diverticulitis.       Assessment & Plan:  1. Left foot pain - DG Foot Complete Left; Future Patient Instructions  Use warm wet compresses 20 minutes at least twice daily with flexion and extension exercises Wear shoes that do not put a lot of pressure across the dorsal foot or at the top of the foot Take Tylenol if needed for pain If problems continue beyond a couple of weeks we will ask the orthopedic surgeon to see you to see if there's  anything else that can be done to help her pain   Nyra Capeson W. Jasmon Graffam MD

## 2014-03-26 NOTE — Telephone Encounter (Signed)
Appt given for today 

## 2014-03-26 NOTE — Patient Instructions (Signed)
Use warm wet compresses 20 minutes at least twice daily with flexion and extension exercises Wear shoes that do not put a lot of pressure across the dorsal foot or at the top of the foot Take Tylenol if needed for pain If problems continue beyond a couple of weeks we will ask the orthopedic surgeon to see you to see if there's anything else that can be done to help her pain

## 2014-04-05 ENCOUNTER — Telehealth: Payer: Self-pay | Admitting: Family Medicine

## 2014-04-05 MED ORDER — AZITHROMYCIN 250 MG PO TABS: ORAL_TABLET | ORAL | Status: DC

## 2014-04-05 NOTE — Telephone Encounter (Signed)
We will call in a Cipro mycin 250 #6 tablets. She will take 2 daily for 3 days for traveler's diarrhea or 2 the first day then one daily for 4 days for bronchitis or respiratory infection

## 2014-04-05 NOTE — Telephone Encounter (Signed)
Per verbal conversation [facility] Patient aware. Also discussed that the likelihood of developing travelers diarrhea is low if she is going to a resort area because of their water filtration system. Even so I advised her to drink bottled water and to avoid ice in her drinks. Patient stated understanding.

## 2014-04-18 ENCOUNTER — Other Ambulatory Visit: Payer: Self-pay | Admitting: Family Medicine

## 2014-05-25 ENCOUNTER — Other Ambulatory Visit: Payer: Self-pay | Admitting: Family Medicine

## 2014-05-28 NOTE — Telephone Encounter (Signed)
Last refill 1/15.

## 2014-06-04 ENCOUNTER — Other Ambulatory Visit: Payer: Self-pay | Admitting: Family Medicine

## 2014-06-26 ENCOUNTER — Ambulatory Visit (INDEPENDENT_AMBULATORY_CARE_PROVIDER_SITE_OTHER): Payer: Medicare Other | Admitting: Family Medicine

## 2014-06-26 ENCOUNTER — Encounter: Payer: Self-pay | Admitting: Family Medicine

## 2014-06-26 VITALS — BP 140/78 | HR 81 | Temp 98.6°F | Ht 61.5 in | Wt 118.0 lb

## 2014-06-26 DIAGNOSIS — K432 Incisional hernia without obstruction or gangrene: Secondary | ICD-10-CM

## 2014-06-26 DIAGNOSIS — R143 Flatulence: Secondary | ICD-10-CM

## 2014-06-26 DIAGNOSIS — R141 Gas pain: Secondary | ICD-10-CM

## 2014-06-26 DIAGNOSIS — R140 Abdominal distension (gaseous): Secondary | ICD-10-CM

## 2014-06-26 DIAGNOSIS — R7989 Other specified abnormal findings of blood chemistry: Secondary | ICD-10-CM

## 2014-06-26 DIAGNOSIS — R142 Eructation: Secondary | ICD-10-CM

## 2014-06-26 LAB — POCT URINALYSIS DIPSTICK
Bilirubin, UA: NEGATIVE
Blood, UA: NEGATIVE
Glucose, UA: NEGATIVE
Ketones, UA: NEGATIVE
LEUKOCYTES UA: NEGATIVE
Nitrite, UA: NEGATIVE
PROTEIN UA: NEGATIVE
Spec Grav, UA: <=1.005
UROBILINOGEN UA: NEGATIVE
pH, UA: 7

## 2014-06-26 LAB — POCT UA - MICROSCOPIC ONLY
Bacteria, U Microscopic: NEGATIVE
Casts, Ur, LPF, POC: NEGATIVE
Crystals, Ur, HPF, POC: NEGATIVE
Mucus, UA: NEGATIVE
RBC, urine, microscopic: NEGATIVE
WBC, UR, HPF, POC: NEGATIVE
YEAST UA: NEGATIVE

## 2014-06-26 LAB — POCT CBC
GRANULOCYTE PERCENT: 55.3 % (ref 37–80)
HCT, POC: 39.7 % (ref 37.7–47.9)
Hemoglobin: 13.3 g/dL (ref 12.2–16.2)
LYMPH, POC: 2.2 (ref 0.6–3.4)
MCH, POC: 31.8 pg — AB (ref 27–31.2)
MCHC: 33.4 g/dL (ref 31.8–35.4)
MCV: 95.1 fL (ref 80–97)
MPV: 7.9 fL (ref 0–99.8)
PLATELET COUNT, POC: 251 10*3/uL (ref 142–424)
POC GRANULOCYTE: 2.9 (ref 2–6.9)
POC LYMPH %: 41.8 % (ref 10–50)
RBC: 4.2 M/uL (ref 4.04–5.48)
RDW, POC: 12.9 %
WBC: 5.3 10*3/uL (ref 4.6–10.2)

## 2014-06-26 NOTE — Patient Instructions (Signed)
Avoid milk cheese ice cream and dairy products Reduce caffeine to one cup of coffee daily Take align, over-the-counter as a probiotic, 1 daily Drink plenty of fluids Monitor blood pressures at home more closely and watch sodium intake We will get you an appointment with Dr. Lovell Sheehan because of the incisional hernia that was found today.

## 2014-06-26 NOTE — Addendum Note (Signed)
Addended by: Magdalene River on: 06/26/2014 09:53 AM   Modules accepted: Orders

## 2014-06-26 NOTE — Progress Notes (Signed)
Subjective:    Patient ID: SUMMERLYN FICKEL, female    DOB: 1940/11/14, 73 y.o.   MRN: [id number]  HPI Patient here today for stomach distention. She feels that this started after her colon surgery in February and it has gotten better. The patient also has a bulge in her right lower abdomen.         Patient Active Problem List   Diagnosis Date Noted  . Diverticulitis large intestine 12/01/2013  . Hypokalemia 10/08/2013  . Anemia 10/08/2013  . Acute diverticulitis 10/07/2013  . 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 N&V (nausea and vomiting) 10/07/2013  . Abdominal pain, acute, left lower quadrant 10/07/2013  . UTI (lower urinary tract infection) 10/07/2013  . Diarrhea 10/07/2013  . Diverticulitis 10/07/2013  . Diverticulosis of colon (without mention of hemorrhage)   . Hypertension   . IBS (irritable bowel syndrome)    Outpatient Encounter Prescriptions as of 06/26/2014  Medication Sig  . Ascorbic Acid (VITAMIN C) 1000 MG tablet Take 1,000 mg by mouth daily.   . Cholecalciferol (VITAMIN D3) 2000 UNITS TABS Take 1 tablet by mouth daily.  . enalapril (VASOTEC) 5 MG tablet TAKE 1 TABLET BY MOUTH EVERY DAY AS DIRECTED  . Multiple Vitamin (MULTIVITAMIN) tablet Take 1 tablet by mouth daily.  [name] Kitchen OVER THE COUNTER MEDICATION Take 1 capsule by mouth 3 (three) times daily. Digestive health cap. - from health food store  . raloxifene (EVISTA) 60 MG tablet TAKE 1 TABLET BY MOUTH ONCE A DAY  . spironolactone (ALDACTONE) 25 MG tablet TAKE 2 TABLETS BY MOUTH EVERY MORNING  . zolpidem (AMBIEN) 5 MG tablet TAKE 1 TABLET BY MOUTH AT BEDTIME AS NEEDED  . [DISCONTINUED] Magnesium Malate POWD Take by mouth daily. Uses 844m tablet, not powder.  . [DISCONTINUED] Multiple Vitamins-Minerals (PRESERVISION AREDS 2 PO) Take by mouth 2 (two) times daily.  . [DISCONTINUED] ranitidine (ZANTAC) 150 MG tablet Take 150 mg by mouth 2 (two) times daily.  . [DISCONTINUED] azithromycin (ZITHROMAX) 250 MG  tablet Diarrhea-2 tablets a day for 3 days.  Upper respiratory infection-2 tablets on day 1 and then 1 tablet daily for 4 days    Review of Systems  Constitutional: Negative.   HENT: Negative.   Eyes: Negative.   Respiratory: Negative.   Cardiovascular: Negative.   Gastrointestinal: Positive for abdominal distention. Negative for abdominal pain.       Hx of IBS  Endocrine: Negative.   Genitourinary: Negative.   Musculoskeletal: Negative.   Skin: Negative.   Allergic/Immunologic: Negative.   Neurological: Negative.   Hematological: Negative.   Psychiatric/Behavioral: Negative.        Objective:   Physical Exam  Nursing note and vitals reviewed. Constitutional: She is oriented to person, place, and time. She appears well-developed and well-nourished. No distress.  HENT:  Head: Normocephalic and atraumatic.  Right Ear: External ear normal.  Left Ear: External ear normal.  Nose: Nose normal.  Mouth/Throat: Oropharynx is clear and moist. No oropharyngeal exudate.  Eyes: Conjunctivae and EOM are normal. Pupils are equal, round, and reactive to light. Right eye exhibits no discharge. Left eye exhibits no discharge. No scleral icterus.  Neck: Normal range of motion. Neck supple. No thyromegaly present.  Cardiovascular: Normal rate, regular rhythm, normal heart sounds and intact distal pulses.  Exam reveals no gallop and no friction rub.   No murmur heard. Pulmonary/Chest: Effort normal and breath sounds normal. No respiratory distress. She has no wheezes. She has no rales. She exhibits no tenderness.  Abdominal: Soft. Bowel sounds are normal. She exhibits distension. She exhibits no mass. There is no tenderness. There is no rebound and no guarding.  There is gaseous distention and no abdominal tenderness With standing there is a bulge in the abdominal wall to the patient's right which appears to be an incisional hernia   Musculoskeletal: Normal range of motion. She exhibits no edema  and no tenderness.  Lymphadenopathy:    She has no cervical adenopathy.  Neurological: She is alert and oriented to person, place, and time. She has normal reflexes. No cranial nerve deficit.  Skin: Skin is warm and dry. No rash noted.  Psychiatric: She has a normal mood and affect. Her behavior is normal. Judgment and thought content normal.    BP 153/71  Pulse 81  Temp(Src) 98.6 F (37 C) (Oral)  Ht 5' 1.5" (1.562 m)  Wt 118 lb (53.524 kg)  BMI 21.94 kg/m2       Assessment & Plan:   1. Abdominal distension - POCT CBC - BMP8+EGFR - Hepatic function panel - POCT urinalysis dipstick - POCT UA - Microscopic Only  2. Incisional hernia, without obstruction or gangrene  Patient Instructions  Avoid milk cheese ice cream and dairy products Reduce caffeine to one cup of coffee daily Take align, over-the-counter as a probiotic, 1 daily Drink plenty of fluids Monitor blood pressures at home more closely and watch sodium intake We will get you an appointment with Dr. Arnoldo Morale because of the incisional hernia that was found today.   Arrie Senate MD

## 2014-06-27 ENCOUNTER — Telehealth: Payer: Self-pay | Admitting: Family Medicine

## 2014-06-27 LAB — HEPATIC FUNCTION PANEL
ALT: 12 IU/L (ref 0–32)
AST: 17 IU/L (ref 0–40)
Albumin: 4.5 g/dL (ref 3.5–4.8)
Alkaline Phosphatase: 49 IU/L (ref 39–117)
Bilirubin, Direct: 0.09 mg/dL (ref 0.00–0.40)
TOTAL PROTEIN: 6.5 g/dL (ref 6.0–8.5)
Total Bilirubin: 0.3 mg/dL (ref 0.0–1.2)

## 2014-06-27 LAB — BMP8+EGFR
BUN/Creatinine Ratio: 20 (ref 11–26)
BUN: 13 mg/dL (ref 8–27)
CHLORIDE: 96 mmol/L — AB (ref 97–108)
CO2: 25 mmol/L (ref 18–29)
Calcium: 9.8 mg/dL (ref 8.7–10.3)
Creatinine, Ser: 0.65 mg/dL (ref 0.57–1.00)
GFR calc Af Amer: 103 mL/min/{1.73_m2} (ref >59)
GFR calc non Af Amer: 89 mL/min/{1.73_m2} (ref >59)
GLUCOSE: 110 mg/dL — AB (ref 65–99)
POTASSIUM: 4.5 mmol/L (ref 3.5–5.2)
Sodium: 137 mmol/L (ref 134–144)

## 2014-06-27 NOTE — Telephone Encounter (Signed)
Message copied by Azalee Course on Wed Jun 27, 2014 12:43 PM ------      Message from: Ernestina Penna      Created: Wed Jun 27, 2014  7:59 AM       The blood sugar is slightly elevated at 110. The creatinine the most important kidney function test is within normal limits. The electrolytes including potassium are within normal limits except the chloride is slightly decreased.   LAB----please add a hemoglobin A1c++++++      Liver function tests are within normal ------

## 2014-06-28 ENCOUNTER — Telehealth: Payer: Self-pay | Admitting: Family Medicine

## 2014-06-28 LAB — POCT GLYCOSYLATED HEMOGLOBIN (HGB A1C)

## 2014-06-28 NOTE — Addendum Note (Signed)
Addended by: Orma Render F on: 06/28/2014 05:08 PM   Modules accepted: Orders

## 2014-06-28 NOTE — Telephone Encounter (Signed)
Referral with jenkins to be done

## 2014-06-29 ENCOUNTER — Ambulatory Visit (HOSPITAL_COMMUNITY)
Admission: RE | Admit: 2014-06-29 | Discharge: 2014-06-29 | Disposition: A | Discharge status: Home / Self Care | Payer: Medicare Other | Source: Ambulatory Visit | Attending: Family Medicine | Admitting: Family Medicine

## 2014-06-29 DIAGNOSIS — R143 Flatulence: Principal | ICD-10-CM

## 2014-06-29 DIAGNOSIS — Z9889 Other specified postprocedural states: Secondary | ICD-10-CM | POA: Insufficient documentation

## 2014-06-29 DIAGNOSIS — R140 Abdominal distension (gaseous): Secondary | ICD-10-CM

## 2014-06-29 DIAGNOSIS — K439 Ventral hernia without obstruction or gangrene: Secondary | ICD-10-CM | POA: Diagnosis not present

## 2014-06-29 DIAGNOSIS — R141 Gas pain: Secondary | ICD-10-CM | POA: Diagnosis present

## 2014-06-29 DIAGNOSIS — R142 Eructation: Principal | ICD-10-CM

## 2014-06-29 DIAGNOSIS — K6389 Other specified diseases of intestine: Secondary | ICD-10-CM | POA: Diagnosis not present

## 2014-06-29 DIAGNOSIS — K432 Incisional hernia without obstruction or gangrene: Secondary | ICD-10-CM

## 2014-06-29 DIAGNOSIS — Z8719 Personal history of other diseases of the digestive system: Secondary | ICD-10-CM | POA: Diagnosis not present

## 2014-06-29 NOTE — Progress Notes (Signed)
Dorothy Pitts discussed results and recommendations with patient

## 2014-07-03 ENCOUNTER — Telehealth: Payer: Self-pay

## 2014-07-03 NOTE — Telephone Encounter (Signed)
Pt aware of results of CT and was given instructions for worsening pain/symptoms. F/u appointment with Dr. Lovell Sheehan made for 07/03/2014 as a work-in

## 2014-07-03 NOTE — Telephone Encounter (Signed)
Message copied by Roselee Culver on Tue Jul 03, 2014  9:59 AM ------      Message from: Ernestina Penna      Created: Fri Jun 29, 2014  4:04 PM       I tried to call the patient will was unable to reach her. Please let the patient know the findings of this report and that she will need to be referred back to the surgeon for 2 reasons, there may be an adhesion causing some small bowel obstruction and she has an abdominal wall hernia. Appointment with a surgeon should be as soon as possible ------

## 2014-07-16 ENCOUNTER — Other Ambulatory Visit: Payer: Self-pay | Admitting: Family Medicine

## 2014-07-17 NOTE — Telephone Encounter (Signed)
This is okay to refill 

## 2014-07-17 NOTE — Telephone Encounter (Signed)
Last ov 06/26/14. Last refill 05/01/14. Route to nurse pool. If approved call to [facility].

## 2014-07-18 ENCOUNTER — Other Ambulatory Visit: Payer: Self-pay | Admitting: Family Medicine

## 2014-07-18 NOTE — Telephone Encounter (Signed)
Called into pharmacy

## 2014-08-02 ENCOUNTER — Ambulatory Visit (INDEPENDENT_AMBULATORY_CARE_PROVIDER_SITE_OTHER): Payer: Medicare Other | Admitting: Family Medicine

## 2014-08-02 ENCOUNTER — Encounter: Payer: Self-pay | Admitting: Family Medicine

## 2014-08-02 VITALS — BP 160/71 | HR 77 | Temp 97.9°F | Ht 61.5 in | Wt 119.0 lb

## 2014-08-02 DIAGNOSIS — J301 Allergic rhinitis due to pollen: Secondary | ICD-10-CM

## 2014-08-02 DIAGNOSIS — I10 Essential (primary) hypertension: Secondary | ICD-10-CM

## 2014-08-02 DIAGNOSIS — K4090 Unilateral inguinal hernia, without obstruction or gangrene, not specified as recurrent: Secondary | ICD-10-CM

## 2014-08-02 DIAGNOSIS — Z13820 Encounter for screening for osteoporosis: Secondary | ICD-10-CM

## 2014-08-02 MED ORDER — RALOXIFENE HCL 60 MG PO TABS: ORAL_TABLET | ORAL | Status: DC

## 2014-08-02 MED ORDER — SPIRONOLACTONE 25 MG PO TABS: ORAL_TABLET | ORAL | Status: DC

## 2014-08-02 MED ORDER — ENALAPRIL MALEATE 5 MG PO TABS: ORAL_TABLET | ORAL | Status: DC

## 2014-08-02 MED ORDER — ZOLPIDEM TARTRATE 5 MG PO TABS: ORAL_TABLET | ORAL | Status: DC

## 2014-08-02 MED ORDER — FLUTICASONE PROPIONATE 50 MCG/ACT NA SUSP: 2.0000 | Freq: Every day | NASAL | Status: DC

## 2014-08-02 NOTE — Progress Notes (Signed)
Subjective:    Patient ID: Dorothy Pitts, female    DOB: 09/08/1941, 73 y.o.   MRN: [id number]  HPI Pt here for follow up and management of chronic medical problems. The patient has had to wait for the visit today. She is somewhat anxious. She has questions about her inguinal hernia. We tried to answer most of these questions. She is requesting some type of low-impact physical therapy that might help her strengthen her abdominal wall. She does have some complaints with her allergic rhinitis appear       Patient Active Problem List   Diagnosis Date Noted  . Diverticulitis large intestine 12/01/2013  . Hypokalemia 10/08/2013  . Anemia 10/08/2013  . Acute diverticulitis 10/07/2013  . 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 N&V (nausea and vomiting) 10/07/2013  . Abdominal pain, acute, left lower quadrant 10/07/2013  . UTI (lower urinary tract infection) 10/07/2013  . Diarrhea 10/07/2013  . Diverticulitis 10/07/2013  . Diverticulosis of colon (without mention of hemorrhage)   . Hypertension   . IBS (irritable bowel syndrome)    Outpatient Encounter Prescriptions as of 08/02/2014  Medication Sig  . Ascorbic Acid (VITAMIN C) 1000 MG tablet Take 1,000 mg by mouth daily.   . Cholecalciferol (VITAMIN D3) 2000 UNITS TABS Take 1 tablet by mouth daily.  . enalapril (VASOTEC) 5 MG tablet TAKE 1 TABLET BY MOUTH EVERY DAY AS DIRECTED  . Multiple Vitamin (MULTIVITAMIN) tablet Take 1 tablet by mouth daily.  [name] Kitchen OVER THE COUNTER MEDICATION Take 1 capsule by mouth 3 (three) times daily. Digestive health cap. - ALIGN  . raloxifene (EVISTA) 60 MG tablet TAKE 1 TABLET BY MOUTH ONCE A DAY  . spironolactone (ALDACTONE) 25 MG tablet TAKE 2 TABLETS BY MOUTH EVERY MORNING  . zolpidem (AMBIEN) 5 MG tablet TAKE 1 TABLET AT BEDTIME    Review of Systems  Constitutional: Negative.   HENT: Negative.   Eyes: Negative.   Respiratory: Negative.   Gastrointestinal: Negative.        Hernia- wants to start  excercise  Endocrine: Negative.   Genitourinary: Negative.   Musculoskeletal: Negative.   Skin: Negative.   Allergic/Immunologic: Negative.   Neurological: Negative.   Hematological: Negative.   Psychiatric/Behavioral: Negative.        Objective:   Physical Exam  Nursing note and vitals reviewed. Constitutional: She is oriented to person, place, and time. She appears well-developed and well-nourished. No distress.  Somewhat anxious today  HENT:  Head: Normocephalic and atraumatic.  Right Ear: External ear normal.  Left Ear: External ear normal.  Mouth/Throat: Oropharynx is clear and moist. No oropharyngeal exudate.  Nasal turbinate congestion left greater than right  Eyes: Conjunctivae and EOM are normal. Pupils are equal, round, and reactive to light. Right eye exhibits no discharge. Left eye exhibits no discharge. No scleral icterus.  Neck: Normal range of motion. Neck supple. No thyromegaly present.  Cardiovascular: Normal rate, regular rhythm, normal heart sounds and intact distal pulses.  Exam reveals no gallop and no friction rub.   No murmur heard. 60 per minute  Pulmonary/Chest: Effort normal and breath sounds normal. No respiratory distress. She has no wheezes. She has no rales. She exhibits no tenderness.  Abdominal: Soft. Bowel sounds are normal. She exhibits no mass. There is no tenderness. There is no rebound and no guarding.  Midline incision scar with no inguinal adenopathy  Musculoskeletal: Normal range of motion. She exhibits no edema and no tenderness.  Lymphadenopathy:    She has  no cervical adenopathy.  Neurological: She is alert and oriented to person, place, and time. She has normal reflexes. No cranial nerve deficit.  Skin: Skin is warm and dry. No rash noted.  Psychiatric: She has a normal mood and affect. Her behavior is normal. Judgment and thought content normal.   BP 160/71  Pulse 77  Temp(Src) 97.9 F (36.6 C) (Oral)  Ht 5' 1.5" (1.562 m)  Wt  119 lb (53.978 kg)  BMI 22.12 kg/m2  Repeat blood pressure remains elevated at 172/100 right arm      Assessment & Plan:  1. Essential hypertension  2. Screening for osteoporosis - DG Bone Density; Future  3. Inguinal hernia without obstruction or gangrene, recurrence not specified, unspecified laterality - Ambulatory referral to Physical Therapy  4. Allergic rhinitis due to pollen  Meds ordered this encounter  Medications  . zolpidem (AMBIEN) 5 MG tablet    Sig: TAKE 1 TABLET AT BEDTIME    Dispense:  30 tablet    Refill:  4    This request is for a new prescription for a controlled substance as required by Federal/State law..  . spironolactone (ALDACTONE) 25 MG tablet    Sig: TAKE 2 TABLETS BY MOUTH EVERY MORNING    Dispense:  180 tablet    Refill:  3  . raloxifene (EVISTA) 60 MG tablet    Sig: TAKE 1 TABLET BY MOUTH ONCE A DAY    Dispense:  90 tablet    Refill:  3  . enalapril (VASOTEC) 5 MG tablet    Sig: TAKE 1 TABLET BY MOUTH EVERY DAY AS DIRECTED    Dispense:  90 tablet    Refill:  3  . fluticasone (FLONASE) 50 MCG/ACT nasal spray    Sig: Place 2 sprays into both nostrils daily.    Dispense:  16 g    Refill:  6   Patient Instructions                       Medicare Annual Wellness Visit  [place] and the medical providers at [facility] strive to bring you the best medical care.  In doing so we not only want to address your current medical conditions and concerns but also to detect new conditions early and prevent illness, disease and health-related problems.    Medicare offers a yearly Wellness Visit which allows our clinical staff to assess your need for preventative services including immunizations, lifestyle education, counseling to decrease risk of preventable diseases and screening for fall risk and other medical concerns.    This visit is provided free of charge (no copay) for all Medicare recipients. The clinical pharmacists at  [facility] have begun to conduct these Wellness Visits which will also include a thorough review of all your medications.    As you primary medical provider recommend that you make an appointment for your Annual Wellness Visit if you have not done so already this year.  You may set up this appointment before you leave today or you may call back [phone]) and schedule an appointment.  Please make sure when you call that you mention that you are scheduling your Annual Wellness Visit with the clinical pharmacist so that the appointment may be made for the proper length of time.     Continue current medications. Continue good therapeutic lifestyle changes which include good diet and exercise. Fall precautions discussed with patient. If an FOBT was given today-  please return it to our front desk. If you are over 73 years old - you may need Prevnar 13 or the adult Pneumonia vaccine.  Flu Shots will be available at our office starting mid- September. Please call and schedule a FLU CLINIC APPOINTMENT.   We will arrange for you to have physical therapy for abdominal wall strengthening Continue to drink plenty of water and keep yourself well hydrated. This is good for your abdominal cavity as well as for your skin turgor Watch sodium intake Continue to eat healthy Return to the office next week for a 24-hour blood pressure monitor   Nyra Capeson W. Moore MD

## 2014-08-02 NOTE — Patient Instructions (Addendum)
Medicare Annual Wellness Visit  [place] and the medical providers at [facility] strive to bring you the best medical care.  In doing so we not only want to address your current medical conditions and concerns but also to detect new conditions early and prevent illness, disease and health-related problems.    Medicare offers a yearly Wellness Visit which allows our clinical staff to assess your need for preventative services including immunizations, lifestyle education, counseling to decrease risk of preventable diseases and screening for fall risk and other medical concerns.    This visit is provided free of charge (no copay) for all Medicare recipients. The clinical pharmacists at [facility] have begun to conduct these Wellness Visits which will also include a thorough review of all your medications.    As you primary medical provider recommend that you make an appointment for your Annual Wellness Visit if you have not done so already this year.  You may set up this appointment before you leave today or you may call back [phone] and schedule an appointment.  Please make sure when you call that you mention that you are scheduling your Annual Wellness Visit with the clinical pharmacist so that the appointment may be made for the proper length of time.     Continue current medications. Continue good therapeutic lifestyle changes which include good diet and exercise. Fall precautions discussed with patient. If an FOBT was given today- please return it to our front desk. If you are over 73 years old - you may need Prevnar 13 or the adult Pneumonia vaccine.  Flu Shots will be available at our office starting mid- September. Please call and schedule a FLU CLINIC APPOINTMENT.   We will arrange for you to have physical therapy for abdominal wall strengthening Continue to drink plenty of water and keep yourself well hydrated.  This is good for your abdominal cavity as well as for your skin turgor Watch sodium intake Continue to eat healthy Return to the office next week for a 24-hour blood pressure monitor

## 2014-08-06 ENCOUNTER — Telehealth: Payer: Self-pay

## 2014-08-06 NOTE — Telephone Encounter (Signed)
Please change diagnosis for physical therapy to lower extremity weakness

## 2014-08-06 NOTE — Telephone Encounter (Signed)
Need to change dx for Physicial therapy for them to pay to lower extremity weakness

## 2014-08-10 ENCOUNTER — Telehealth: Payer: Self-pay | Admitting: Family Medicine

## 2014-08-11 NOTE — Telephone Encounter (Signed)
Pt aware - time of meeting with Noreene LarssonJill  - at 330 on monday

## 2014-08-13 ENCOUNTER — Ambulatory Visit: Payer: Medicare Other | Admitting: *Deleted

## 2014-08-13 DIAGNOSIS — H9193 Unspecified hearing loss, bilateral: Secondary | ICD-10-CM

## 2014-08-13 NOTE — Progress Notes (Signed)
24 HR BP MONITOR STARTED . Patient voices understanding.

## 2014-08-14 ENCOUNTER — Other Ambulatory Visit: Payer: Self-pay | Admitting: *Deleted

## 2014-08-14 DIAGNOSIS — R198 Other specified symptoms and signs involving the digestive system and abdomen: Secondary | ICD-10-CM

## 2014-08-14 DIAGNOSIS — R531 Weakness: Secondary | ICD-10-CM

## 2014-08-21 ENCOUNTER — Ambulatory Visit: Payer: Medicare Other | Attending: Family Medicine | Admitting: Physical Therapy

## 2014-08-21 DIAGNOSIS — Z5189 Encounter for other specified aftercare: Secondary | ICD-10-CM | POA: Diagnosis not present

## 2014-08-21 DIAGNOSIS — I10 Essential (primary) hypertension: Secondary | ICD-10-CM | POA: Diagnosis not present

## 2014-08-21 DIAGNOSIS — R531 Weakness: Secondary | ICD-10-CM | POA: Diagnosis not present

## 2014-08-21 DIAGNOSIS — M545 Low back pain: Secondary | ICD-10-CM | POA: Insufficient documentation

## 2014-08-23 ENCOUNTER — Ambulatory Visit: Payer: Medicare Other | Admitting: Physical Therapy

## 2014-08-23 DIAGNOSIS — Z5189 Encounter for other specified aftercare: Secondary | ICD-10-CM | POA: Diagnosis not present

## 2014-08-27 ENCOUNTER — Ambulatory Visit: Payer: Medicare Other | Attending: Family Medicine | Admitting: Physical Therapy

## 2014-08-27 DIAGNOSIS — M545 Low back pain: Secondary | ICD-10-CM | POA: Diagnosis not present

## 2014-08-27 DIAGNOSIS — R531 Weakness: Secondary | ICD-10-CM | POA: Diagnosis not present

## 2014-08-27 DIAGNOSIS — I10 Essential (primary) hypertension: Secondary | ICD-10-CM | POA: Insufficient documentation

## 2014-08-27 DIAGNOSIS — Z5189 Encounter for other specified aftercare: Secondary | ICD-10-CM | POA: Insufficient documentation

## 2014-08-28 ENCOUNTER — Ambulatory Visit: Payer: Medicare Other | Admitting: *Deleted

## 2014-08-28 DIAGNOSIS — Z5189 Encounter for other specified aftercare: Secondary | ICD-10-CM | POA: Diagnosis not present

## 2014-09-03 ENCOUNTER — Ambulatory Visit: Payer: Medicare Other | Admitting: Physical Therapy

## 2014-09-03 ENCOUNTER — Telehealth: Payer: Self-pay | Admitting: Family Medicine

## 2014-09-03 DIAGNOSIS — Z5189 Encounter for other specified aftercare: Secondary | ICD-10-CM | POA: Diagnosis not present

## 2014-09-03 NOTE — Telephone Encounter (Signed)
The patient is unable to take NSAIDs. She should wear a firm sole shoe and she use warm wet compresses to her feet. She can take extra strength Tylenol as needed for pain

## 2014-09-03 NOTE — Telephone Encounter (Signed)
Details on how to relieve her foot/ankle pain given on message.

## 2014-09-10 ENCOUNTER — Ambulatory Visit: Payer: Medicare Other | Admitting: Physical Therapy

## 2014-09-10 DIAGNOSIS — Z5189 Encounter for other specified aftercare: Secondary | ICD-10-CM | POA: Diagnosis not present

## 2014-09-13 ENCOUNTER — Ambulatory Visit: Payer: Medicare Other | Admitting: Physical Therapy

## 2014-09-13 DIAGNOSIS — Z5189 Encounter for other specified aftercare: Secondary | ICD-10-CM | POA: Diagnosis not present

## 2014-09-17 ENCOUNTER — Ambulatory Visit: Payer: Medicare Other | Admitting: Physical Therapy

## 2014-09-17 DIAGNOSIS — Z5189 Encounter for other specified aftercare: Secondary | ICD-10-CM | POA: Diagnosis not present

## 2014-09-26 ENCOUNTER — Ambulatory Visit: Payer: Medicare Other | Attending: Family Medicine | Admitting: Physical Therapy

## 2014-09-26 DIAGNOSIS — R531 Weakness: Secondary | ICD-10-CM | POA: Diagnosis not present

## 2014-09-26 DIAGNOSIS — M545 Low back pain: Secondary | ICD-10-CM | POA: Insufficient documentation

## 2014-09-26 DIAGNOSIS — Z5189 Encounter for other specified aftercare: Secondary | ICD-10-CM | POA: Diagnosis present

## 2014-09-26 DIAGNOSIS — I10 Essential (primary) hypertension: Secondary | ICD-10-CM | POA: Insufficient documentation

## 2014-10-03 ENCOUNTER — Ambulatory Visit: Payer: Medicare Other | Admitting: Physical Therapy

## 2014-10-03 DIAGNOSIS — Z5189 Encounter for other specified aftercare: Secondary | ICD-10-CM | POA: Diagnosis not present

## 2014-11-07 ENCOUNTER — Ambulatory Visit (INDEPENDENT_AMBULATORY_CARE_PROVIDER_SITE_OTHER): Payer: Medicare Other

## 2014-11-07 ENCOUNTER — Encounter: Payer: Self-pay | Admitting: Pharmacist

## 2014-11-07 ENCOUNTER — Ambulatory Visit (INDEPENDENT_AMBULATORY_CARE_PROVIDER_SITE_OTHER): Payer: Medicare Other | Admitting: Pharmacist

## 2014-11-07 VITALS — BP 120/60 | HR 67 | Ht 60.25 in | Wt 120.5 lb

## 2014-11-07 DIAGNOSIS — Z13820 Encounter for screening for osteoporosis: Secondary | ICD-10-CM

## 2014-11-07 DIAGNOSIS — M810 Age-related osteoporosis without current pathological fracture: Secondary | ICD-10-CM

## 2014-11-07 DIAGNOSIS — Z0000 Encounter for general adult medical examination without abnormal findings: Secondary | ICD-10-CM

## 2014-11-07 LAB — HM DEXA SCAN

## 2014-11-07 NOTE — Patient Instructions (Signed)
Health Maintenance Summary     DEXA SCAN Next Due 11/07/2016 Done today    INFLUENZA VACCINE Postponed 06/10/2015 Previous reaction to vaccine   Pneumonia Vaccine - prevnar 13 recommended Postponed  Previous reaction to vaccine   Shingles Vaccine / Zostavax Completed      TETANUS/TDAP Next Due 04/19/2016 Last 04/19/2006    MAMMOGRAM Next Due 07/31/2016 Last 07/31/2014    COLONOSCOPY Next Due 09/09/2022 Last 09/09/2012       Fall Prevention and Home Safety Falls cause injuries and can affect all age groups. It is possible to use preventive measures to significantly decrease the likelihood of falls. There are many simple measures which can make your home safer and prevent falls. OUTDOORS  Repair cracks and edges of walkways and driveways.  Remove high doorway thresholds.  Trim shrubbery on the main path into your home.  Have good outside lighting.  Clear walkways of tools, rocks, debris, and clutter.  Check that handrails are not broken and are securely fastened. Both sides of steps should have handrails.  Have leaves, snow, and ice cleared regularly.  Use sand or salt on walkways during winter months.  In the garage, clean up grease or oil spills. BATHROOM  Install night lights.  Install grab bars by the toilet and in the tub and shower.  Use non-skid mats or decals in the tub or shower.  Place a plastic non-slip stool in the shower to sit on, if needed.  Keep floors dry and clean up all water on the floor immediately.  Remove soap buildup in the tub or shower on a regular basis.  Secure bath mats with non-slip, double-sided rug tape.  Remove throw rugs and tripping hazards from the floors. BEDROOMS  Install night lights.  Make sure a bedside light is easy to reach.  Do not use oversized bedding.  Keep a telephone by your bedside.  Have a firm chair with side arms to use for getting dressed.  Remove throw rugs and tripping hazards from the  floor. KITCHEN  Keep handles on pots and pans turned toward the center of the stove. Use back burners when possible.  Clean up spills quickly and allow time for drying.  Avoid walking on wet floors.  Avoid hot utensils and knives.  Position shelves so they are not too high or low.  Place commonly used objects within easy reach.  If necessary, use a sturdy step stool with a grab bar when reaching.  Keep electrical cables out of the way.  Do not use floor polish or wax that makes floors slippery. If you must use wax, use non-skid floor wax.  Remove throw rugs and tripping hazards from the floor. STAIRWAYS  Never leave objects on stairs.  Place handrails on both sides of stairways and use them. Fix any loose handrails. Make sure handrails on both sides of the stairways are as long as the stairs.  Check carpeting to make sure it is firmly attached along stairs. Make repairs to worn or loose carpet promptly.  Avoid placing throw rugs at the top or bottom of stairways, or properly secure the rug with carpet tape to prevent slippage. Get rid of throw rugs, if possible.  Have an electrician put in a light switch at the top and bottom of the stairs. OTHER FALL PREVENTION TIPS  Wear low-heel or rubber-soled shoes that are supportive and fit well. Wear closed toe shoes.  When using a stepladder, make sure it is fully opened and both spreaders  are firmly locked. Do not climb a closed stepladder.  Add color or contrast paint or tape to grab bars and handrails in your home. Place contrasting color strips on first and last steps.  Learn and use mobility aids as needed. Install an electrical emergency response system.  Turn on lights to avoid dark areas. Replace light bulbs that burn out immediately. Get light switches that glow.  Arrange furniture to create clear pathways. Keep furniture in the same place.  Firmly attach carpet with non-skid or double-sided tape.  Eliminate uneven  floor surfaces.  Select a carpet pattern that does not visually hide the edge of steps.  Be aware of all pets. OTHER HOME SAFETY TIPS  Set the water temperature for 120 F (48.8 C).  Keep emergency numbers on or near the telephone.  Keep smoke detectors on every level of the home and near sleeping areas. Document Released: 10/02/2002 Document Revised: 04/12/2012 Document Reviewed: 01/01/2012 [facility] Patient Information 2015 [place], [place]. This information is not intended to replace advice given to you by your health care provider. Make sure you discuss any questions you have with your health care provider.  Preventive Care for Adults A healthy lifestyle and preventive care can promote health and wellness. Preventive health guidelines for women include the following key practices.  A routine yearly physical is a good way to check with your health care provider about your health and preventive screening. It is a chance to share any concerns and updates on your health and to receive a thorough exam.  Visit your dentist for a routine exam and preventive care every 6 months. Brush your teeth twice a day and floss once a day. Good oral hygiene prevents tooth decay and gum disease.  The frequency of eye exams is based on your age, health, family medical history, use of contact lenses, and other factors. Follow your health care provider's recommendations for frequency of eye exams.  Eat a healthy diet. Foods like vegetables, fruits, whole grains, low-fat dairy products, and lean protein foods contain the nutrients you need without too many calories. Decrease your intake of foods high in solid fats, added sugars, and salt. Eat the right amount of calories for you.Get information about a proper diet from your health care provider, if necessary.  Regular physical exercise is one of the most important things you can do for your health. Most adults should get at least 150 minutes of  moderate-intensity exercise (any activity that increases your heart rate and causes you to sweat) each week. In addition, most adults need muscle-strengthening exercises on 2 or more days a week.  Maintain a healthy weight. The body mass index (BMI) is a screening tool to identify possible weight problems. It provides an estimate of body fat based on height and weight. Your health care provider can find your BMI and can help you achieve or maintain a healthy weight.For adults 20 years and older:  A BMI below 18.5 is considered underweight.  A BMI of 18.5 to 24.9 is normal.  A BMI of 25 to 29.9 is considered overweight.  A BMI of 30 and above is considered obese.  Maintain normal blood lipids and cholesterol levels by exercising and minimizing your intake of saturated fat. Eat a balanced diet with plenty of fruit and vegetables. Blood tests for lipids and cholesterol should begin at age 65 and be repeated every 5 years. If your lipid or cholesterol levels are high, you are over 50, or you are at high  risk for heart disease, you may need your cholesterol levels checked more frequently.Ongoing high lipid and cholesterol levels should be treated with medicines if diet and exercise are not working.  If you smoke, find out from your health care provider how to quit. If you do not use tobacco, do not start.  Lung cancer screening is recommended for adults aged 56-80 years who are at high risk for developing lung cancer because of a history of smoking. A yearly low-dose CT scan of the lungs is recommended for people who have at least a 30-pack-year history of smoking and are a current smoker or have quit within the past 15 years. A pack year of smoking is smoking an average of 1 pack of cigarettes a day for 1 year (for example: 1 pack a day for 30 years or 2 packs a day for 15 years). Yearly screening should continue until the smoker has stopped smoking for at least 15 years. Yearly screening should be  stopped for people who develop a health problem that would prevent them from having lung cancer treatment.  If you are pregnant, do not drink alcohol. If you are breastfeeding, be very cautious about drinking alcohol. If you are not pregnant and choose to drink alcohol, do not have more than 1 drink per day. One drink is considered to be 12 ounces (355 mL) of beer, 5 ounces (148 mL) of wine, or 1.5 ounces (44 mL) of liquor.  Avoid use of street drugs. Do not share needles with anyone. Ask for help if you need support or instructions about stopping the use of drugs.  High blood pressure causes heart disease and increases the risk of stroke. Your blood pressure should be checked at least every 1 to 2 years. Ongoing high blood pressure should be treated with medicines if weight loss and exercise do not work.  If you are 40-106 years old, ask your health care provider if you should take aspirin to prevent strokes.  Diabetes screening involves taking a blood sample to check your fasting blood sugar level. This should be done once every 3 years, after age 53, if you are within normal weight and without risk factors for diabetes. Testing should be considered at a younger age or be carried out more frequently if you are overweight and have at least 1 risk factor for diabetes.  Breast cancer screening is essential preventive care for women. You should practice "breast self-awareness." This means understanding the normal appearance and feel of your breasts and may include breast self-examination. Any changes detected, no matter how small, should be reported to a health care provider. Women in their 55s and 30s should have a clinical breast exam (CBE) by a health care provider as part of a regular health exam every 1 to 3 years. After age 29, women should have a CBE every year. Starting at age 21, women should consider having a mammogram (breast X-ray test) every year. Women who have a family history of breast  cancer should talk to their health care provider about genetic screening. Women at a high risk of breast cancer should talk to their health care providers about having an MRI and a mammogram every year.  Breast cancer gene (BRCA)-related cancer risk assessment is recommended for women who have family members with BRCA-related cancers. BRCA-related cancers include breast, ovarian, tubal, and peritoneal cancers. Having family members with these cancers may be associated with an increased risk for harmful changes (mutations) in the breast cancer genes BRCA1  and BRCA2. Results of the assessment will determine the need for genetic counseling and BRCA1 and BRCA2 testing.  Routine pelvic exams to screen for cancer are no longer recommended for nonpregnant women who are considered low risk for cancer of the pelvic organs (ovaries, uterus, and vagina) and who do not have symptoms. Ask your health care provider if a screening pelvic exam is right for you.  If you have had past treatment for cervical cancer or a condition that could lead to cancer, you need Pap tests and screening for cancer for at least 20 years after your treatment. If Pap tests have been discontinued, your risk factors (such as having a new sexual partner) need to be reassessed to determine if screening should be resumed. Some women have medical problems that increase the chance of getting cervical cancer. In these cases, your health care provider may recommend more frequent screening and Pap tests.  The HPV test is an additional test that may be used for cervical cancer screening. The HPV test looks for the virus that can cause the cell changes on the cervix. The cells collected during the Pap test can be tested for HPV. The HPV test could be used to screen women aged 67 years and older, and should be used in women of any age who have unclear Pap test results. After the age of 83, women should have HPV testing at the same frequency as a Pap  test.  Colorectal cancer can be detected and often prevented. Most routine colorectal cancer screening begins at the age of 78 years and continues through age 40 years. However, your health care provider may recommend screening at an earlier age if you have risk factors for colon cancer. On a yearly basis, your health care provider may provide home test kits to check for hidden blood in the stool. Use of a small camera at the end of a tube, to directly examine the colon (sigmoidoscopy or colonoscopy), can detect the earliest forms of colorectal cancer. Talk to your health care provider about this at age 34, when routine screening begins. Direct exam of the colon should be repeated every 5-10 years through age 80 years, unless early forms of pre-cancerous polyps or small growths are found.  People who are at an increased risk for hepatitis B should be screened for this virus. You are considered at high risk for hepatitis B if:  You were born in a country where hepatitis B occurs often. Talk with your health care provider about which countries are considered high risk.  Your parents were born in a high-risk country and you have not received a shot to protect against hepatitis B (hepatitis B vaccine).  You have HIV or AIDS.  You use needles to inject street drugs.  You live with, or have sex with, someone who has hepatitis B.  You get hemodialysis treatment.  You take certain medicines for conditions like cancer, organ transplantation, and autoimmune conditions.  Hepatitis C blood testing is recommended for all people born from [birth year] through 1965 and any individual with known risks for hepatitis C.  Practice safe sex. Use condoms and avoid high-risk sexual practices to reduce the spread of sexually transmitted infections (STIs). STIs include gonorrhea, chlamydia, syphilis, trichomonas, herpes, HPV, and human immunodeficiency virus (HIV). Herpes, HIV, and HPV are viral illnesses that have no cure.  They can result in disability, cancer, and death.  You should be screened for sexually transmitted illnesses (STIs) including gonorrhea and chlamydia if:  You are sexually active and are younger than 24 years.  You are older than 24 years and your health care provider tells you that you are at risk for this type of infection.  Your sexual activity has changed since you were last screened and you are at an increased risk for chlamydia or gonorrhea. Ask your health care provider if you are at risk.  If you are at risk of being infected with HIV, it is recommended that you take a prescription medicine daily to prevent HIV infection. This is called preexposure prophylaxis (PrEP). You are considered at risk if:  You are a heterosexual woman, are sexually active, and are at increased risk for HIV infection.  You take drugs by injection.  You are sexually active with a partner who has HIV.  Talk with your health care provider about whether you are at high risk of being infected with HIV. If you choose to begin PrEP, you should first be tested for HIV. You should then be tested every 3 months for as long as you are taking PrEP.  Osteoporosis is a disease in which the bones lose minerals and strength with aging. This can result in serious bone fractures or breaks. The risk of osteoporosis can be identified using a bone density scan. Women ages 56 years and over and women at risk for fractures or osteoporosis should discuss screening with their health care providers. Ask your health care provider whether you should take a calcium supplement or vitamin D to reduce the rate of osteoporosis.  Menopause can be associated with physical symptoms and risks. Hormone replacement therapy is available to decrease symptoms and risks. You should talk to your health care provider about whether hormone replacement therapy is right for you.  Use sunscreen. Apply sunscreen liberally and repeatedly throughout the day.  You should seek shade when your shadow is shorter than you. Protect yourself by wearing long sleeves, pants, a wide-brimmed hat, and sunglasses year round, whenever you are outdoors.  Once a month, do a whole body skin exam, using a mirror to look at the skin on your back. Tell your health care provider of new moles, moles that have irregular borders, moles that are larger than a pencil eraser, or moles that have changed in shape or color.  Stay current with required vaccines (immunizations).  Influenza vaccine. All adults should be immunized every year.  Tetanus, diphtheria, and acellular pertussis (Td, Tdap) vaccine. Pregnant women should receive 1 dose of Tdap vaccine during each pregnancy. The dose should be obtained regardless of the length of time since the last dose. Immunization is preferred during the 27th-36th week of gestation. An adult who has not previously received Tdap or who does not know her vaccine status should receive 1 dose of Tdap. This initial dose should be followed by tetanus and diphtheria toxoids (Td) booster doses every 10 years. Adults with an unknown or incomplete history of completing a 3-dose immunization series with Td-containing vaccines should begin or complete a primary immunization series including a Tdap dose. Adults should receive a Td booster every 10 years.  Varicella vaccine. An adult without evidence of immunity to varicella should receive 2 doses or a second dose if she has previously received 1 dose. Pregnant females who do not have evidence of immunity should receive the first dose after pregnancy. This first dose should be obtained before leaving the health care facility. The second dose should be obtained 4-8 weeks after the first dose.  Human papillomavirus (  HPV) vaccine. Females aged 13-26 years who have not received the vaccine previously should obtain the 3-dose series. The vaccine is not recommended for use in pregnant females. However, pregnancy  testing is not needed before receiving a dose. If a female is found to be pregnant after receiving a dose, no treatment is needed. In that case, the remaining doses should be delayed until after the pregnancy. Immunization is recommended for any person with an immunocompromised condition through the age of 65 years if she did not get any or all doses earlier. During the 3-dose series, the second dose should be obtained 4-8 weeks after the first dose. The third dose should be obtained 24 weeks after the first dose and 16 weeks after the second dose.  Zoster vaccine. One dose is recommended for adults aged 66 years or older unless certain conditions are present.  Measles, mumps, and rubella (MMR) vaccine. Adults born before 40 generally are considered immune to measles and mumps. Adults born in [birth year] or later should have 1 or more doses of MMR vaccine unless there is a contraindication to the vaccine or there is laboratory evidence of immunity to each of the three diseases. A routine second dose of MMR vaccine should be obtained at least 28 days after the first dose for students attending postsecondary schools, health care workers, or international travelers. People who received inactivated measles vaccine or an unknown type of measles vaccine during 1963-1967 should receive 2 doses of MMR vaccine. People who received inactivated mumps vaccine or an unknown type of mumps vaccine before 1979 and are at high risk for mumps infection should consider immunization with 2 doses of MMR vaccine. For females of childbearing age, rubella immunity should be determined. If there is no evidence of immunity, females who are not pregnant should be vaccinated. If there is no evidence of immunity, females who are pregnant should delay immunization until after pregnancy. Unvaccinated health care workers born before [birth year] who lack laboratory evidence of measles, mumps, or rubella immunity or laboratory confirmation of disease should  consider measles and mumps immunization with 2 doses of MMR vaccine or rubella immunization with 1 dose of MMR vaccine.  Pneumococcal 13-valent conjugate (PCV13) vaccine. When indicated, a person who is uncertain of her immunization history and has no record of immunization should receive the PCV13 vaccine. An adult aged 10 years or older who has certain medical conditions and has not been previously immunized should receive 1 dose of PCV13 vaccine. This PCV13 should be followed with a dose of pneumococcal polysaccharide (PPSV23) vaccine. The PPSV23 vaccine dose should be obtained at least 8 weeks after the dose of PCV13 vaccine. An adult aged 14 years or older who has certain medical conditions and previously received 1 or more doses of PPSV23 vaccine should receive 1 dose of PCV13. The PCV13 vaccine dose should be obtained 1 or more years after the last PPSV23 vaccine dose.  Pneumococcal polysaccharide (PPSV23) vaccine. When PCV13 is also indicated, PCV13 should be obtained first. All adults aged 23 years and older should be immunized. An adult younger than age 71 years who has certain medical conditions should be immunized. Any person who resides in a nursing home or long-term care facility should be immunized. An adult smoker should be immunized. People with an immunocompromised condition and certain other conditions should receive both PCV13 and PPSV23 vaccines. People with human immunodeficiency virus (HIV) infection should be immunized as soon as possible after diagnosis. Immunization during chemotherapy or radiation therapy should  be avoided. Routine use of PPSV23 vaccine is not recommended for American Indians, [place] Natives, or people younger than 65 years unless there are medical conditions that require PPSV23 vaccine. When indicated, people who have unknown immunization and have no record of immunization should receive PPSV23 vaccine. One-time revaccination 5 years after the first dose of PPSV23 is  recommended for people aged 19-64 years who have chronic kidney failure, nephrotic syndrome, asplenia, or immunocompromised conditions. People who received 1-2 doses of PPSV23 before age 70 years should receive another dose of PPSV23 vaccine at age 65 years or later if at least 5 years have passed since the previous dose. Doses of PPSV23 are not needed for people immunized with PPSV23 at or after age 74 years.  Meningococcal vaccine. Adults with asplenia or persistent complement component deficiencies should receive 2 doses of quadrivalent meningococcal conjugate (MenACWY-D) vaccine. The doses should be obtained at least 2 months apart. Microbiologists working with certain meningococcal bacteria, [employer] recruits, people at risk during an outbreak, and people who travel to or live in countries with a high rate of meningitis should be immunized. A first-year college student up through age 52 years who is living in a residence hall should receive a dose if she did not receive a dose on or after her 16th birthday. Adults who have certain high-risk conditions should receive one or more doses of vaccine.  Hepatitis A vaccine. Adults who wish to be protected from this disease, have certain high-risk conditions, work with hepatitis A-infected animals, work in hepatitis A research labs, or travel to or work in countries with a high rate of hepatitis A should be immunized. Adults who were previously unvaccinated and who anticipate close contact with an international adoptee during the first 60 days after arrival in the [place] from a country with a high rate of hepatitis A should be immunized.  Hepatitis B vaccine. Adults who wish to be protected from this disease, have certain high-risk conditions, may be exposed to blood or other infectious body fluids, are household contacts or sex partners of hepatitis B positive people, are clients or workers in certain care facilities, or travel to or work in countries  with a high rate of hepatitis B should be immunized.  Haemophilus influenzae type b (Hib) vaccine. A previously unvaccinated person with asplenia or sickle cell disease or having a scheduled splenectomy should receive 1 dose of Hib vaccine. Regardless of previous immunization, a recipient of a hematopoietic stem cell transplant should receive a 3-dose series 6-12 months after her successful transplant. Hib vaccine is not recommended for adults with HIV infection. Preventive Services / Frequency Ages 51 years and over  Blood pressure check.** / Every 1 to 2 years.  Lipid and cholesterol check.** / Every 5 years beginning at age 18 years.  Lung cancer screening. / Every year if you are aged 78-80 years and have a 30-pack-year history of smoking and currently smoke or have quit within the past 15 years. Yearly screening is stopped once you have quit smoking for at least 15 years or develop a health problem that would prevent you from having lung cancer treatment.  Clinical breast exam.** / Every year after age 73 years.  BRCA-related cancer risk assessment.** / For women who have family members with a BRCA-related cancer (breast, ovarian, tubal, or peritoneal cancers).  Mammogram.** / Every year beginning at age 40 years and continuing for as long as you are in good health. Consult with your health care provider.  Pap test.** / Every 3 years starting at age 33 years through age 43 or 83 years with 3 consecutive normal Pap tests. Testing can be stopped between 65 and 70 years with 3 consecutive normal Pap tests and no abnormal Pap or HPV tests in the past 10 years.  HPV screening.** / Every 3 years from ages 36 years through ages 41 or 90 years with a history of 3 consecutive normal Pap tests. Testing can be stopped between 65 and 70 years with 3 consecutive normal Pap tests and no abnormal Pap or HPV tests in the past 10 years.  Fecal occult blood test (FOBT) of stool. / Every year beginning at  age 14 years and continuing until age 64 years. You may not need to do this test if you get a colonoscopy every 10 years.  Flexible sigmoidoscopy or colonoscopy.** / Every 5 years for a flexible sigmoidoscopy or every 10 years for a colonoscopy beginning at age 69 years and continuing until age 71 years.  Hepatitis C blood test.** / For all people born from [birth year] through 1965 and any individual with known risks for hepatitis C.  Osteoporosis screening.** / A one-time screening for women ages 34 years and over and women at risk for fractures or osteoporosis.  Skin self-exam. / Monthly.  Influenza vaccine. / Every year.  Tetanus, diphtheria, and acellular pertussis (Tdap/Td) vaccine.** / 1 dose of Td every 10 years.  Varicella vaccine.** / Consult your health care provider.  Zoster vaccine.** / 1 dose for adults aged 77 years or older.  Pneumococcal 13-valent conjugate (PCV13) vaccine.** / Consult your health care provider.  Pneumococcal polysaccharide (PPSV23) vaccine.** / 1 dose for all adults aged 70 years and older.  Meningococcal vaccine.** / Consult your health care provider.  Hepatitis A vaccine.** / Consult your health care provider.  Hepatitis B vaccine.** / Consult your health care provider.  Haemophilus influenzae type b (Hib) vaccine.** / Consult your health care provider. ** Family history and personal history of risk and conditions may change your health care provider's recommendations. Document Released: 12/08/2001 Document Revised: 02/26/2014 Document Reviewed: 03/09/2011 [facility] Patient Information 2015 [place], [place]. This information is not intended to replace advice given to you by your health care provider. Make sure you discuss any questions you have with your health care provider.

## 2014-11-07 NOTE — Progress Notes (Signed)
Patient ID: Dorothy CootsJudith M Galentine, female   DOB: 04-16-1941, 74 y.o.   MRN: [id number]  Subjective:    Dorothy Pitts is a 74 y.o. female who presents for Medicare Initial preventive examination and review DEXA / osteoporosis.  Patient diagnosed with osteoporosis several years ago.  She has tried oral bisphosphonates in past and has not been able to tolerate due to worsening GERD.   She is currently taking Evista 60mg  1 tablet daily.      Preventive Screening-Counseling & Management  Tobacco History  Smoking status  . Former Smoker -- 1.00 packs/day for 35 years  . Types: Cigarettes  . Quit date: 10/26/2002  Smokeless tobacco  . Never Used    Current Problems (verified) Patient Active Problem List   Diagnosis Date Noted  . Osteoporosis 11/07/2014  . Diverticulitis large intestine 12/01/2013  . Hypokalemia 10/08/2013  . Anemia 10/08/2013  . Acute diverticulitis 10/07/2013  . 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 N&V (nausea and vomiting) 10/07/2013  . Abdominal pain, acute, left lower quadrant 10/07/2013  . UTI (lower urinary tract infection) 10/07/2013  . Diarrhea 10/07/2013  . Diverticulitis 10/07/2013  . Diverticulosis of colon (without mention of hemorrhage)   . Hypertension   . IBS (irritable bowel syndrome)     Medications Prior to Visit Current Outpatient Prescriptions on File Prior to Visit  Medication Sig Dispense Refill  . Ascorbic Acid (VITAMIN C) 1000 MG tablet Take 1,000 mg by mouth daily.     . Cholecalciferol (VITAMIN D3) 2000 UNITS TABS Take 1 tablet by mouth daily.    . enalapril (VASOTEC) 5 MG tablet TAKE 1 TABLET BY MOUTH EVERY DAY AS DIRECTED 90 tablet 3  . fluticasone (FLONASE) 50 MCG/ACT nasal spray Place 2 sprays into both nostrils daily. 16 g 6  . Multiple Vitamin (MULTIVITAMIN) tablet Take 1 tablet by mouth daily.    [name] Kitchen. OVER THE COUNTER MEDICATION Take 1 capsule by mouth 3 (three) times daily. Digestive health cap. - ALIGN    . raloxifene  (EVISTA) 60 MG tablet TAKE 1 TABLET BY MOUTH ONCE A DAY 90 tablet 3  . spironolactone (ALDACTONE) 25 MG tablet TAKE 2 TABLETS BY MOUTH EVERY MORNING 180 tablet 3  . zolpidem (AMBIEN) 5 MG tablet TAKE 1 TABLET AT BEDTIME (Patient taking differently: at bedtime as needed. TAKE 1 TABLET AT BEDTIME) 30 tablet 4   No current facility-administered medications on file prior to visit.    Current Medications (verified) Current Outpatient Prescriptions  Medication Sig Dispense Refill  . Ascorbic Acid (VITAMIN C) 1000 MG tablet Take 1,000 mg by mouth daily.     . Cholecalciferol (VITAMIN D3) 2000 UNITS TABS Take 1 tablet by mouth daily.    . enalapril (VASOTEC) 5 MG tablet TAKE 1 TABLET BY MOUTH EVERY DAY AS DIRECTED 90 tablet 3  . fluticasone (FLONASE) 50 MCG/ACT nasal spray Place 2 sprays into both nostrils daily. 16 g 6  . Multiple Vitamin (MULTIVITAMIN) tablet Take 1 tablet by mouth daily.    [name] Kitchen. OVER THE COUNTER MEDICATION Take 1 capsule by mouth 3 (three) times daily. Digestive health cap. - ALIGN    . OVER THE COUNTER MEDICATION Digest Basic - digestive enzymes    . raloxifene (EVISTA) 60 MG tablet TAKE 1 TABLET BY MOUTH ONCE A DAY 90 tablet 3  . spironolactone (ALDACTONE) 25 MG tablet TAKE 2 TABLETS BY MOUTH EVERY MORNING 180 tablet 3  . zolpidem (AMBIEN) 5 MG tablet TAKE 1 TABLET AT BEDTIME (Patient  taking differently: at bedtime as needed. TAKE 1 TABLET AT BEDTIME) 30 tablet 4   No current facility-administered medications for this visit.     Allergies (verified) Actonel; Fosamax; Lovaza; Nsaids; Cephalexin; and Ciprofloxacin   PAST HISTORY  Family History Family History  Problem Relation Age of Onset  . Colon cancer Neg Hx   . Hypertension Mother   . Heart attack Mother 26  . Parkinson's disease Father   . Heart disease Brother 45    bypass  . Obesity Brother     Social History History  Substance Use Topics  . Smoking status: Former Smoker -- 1.00 packs/day for 35 years     Types: Cigarettes    Quit date: 10/26/2002  . Smokeless tobacco: Never Used  . Alcohol Use: 3.0 oz/week    5 Glasses of wine per week     Comment: wine     Are there smokers in your home (other than you)? No  Risk Factors Current exercise habits: Home exercise routine includes walkign 30 minutes daily and physical therapy weekly.  Dietary issues discussed: dietary calcium intake  Calcium Assessment Calcium Intake  # of servings/day  Calcium mg  Almond Milk (8 oz) 1 X    Yogurt (4 oz) 0 x  200 = 0  Cheese (1 oz) 1 x  200 =   Other Calcium sources     Ca supplement MVI daily =    Estimated calcium intake per day     Cardiac risk factors: advanced age (older than 13 for men, 13 for women), dyslipidemia, family history of premature cardiovascular disease and hypertension.  Depression Screen (Note: if answer to either of the following is "Yes", a more complete depression screening is indicated)   Over the past 2 weeks, have you felt down, depressed or hopeless? No  Over the past 2 weeks, have you felt little interest or pleasure in doing things? No  Have you lost interest or pleasure in daily life? No  Do you often feel hopeless? No  Do you cry easily over simple problems? No  Activities of Daily Living In your present state of health, do you have any difficulty performing the following activities?:  Driving? No Managing money?  No Feeding yourself? No Getting from bed to chair? No   Climbing a flight of stairs? No Preparing food and eating?: No Bathing or showering? No Getting dressed: No Getting to the toilet? No Using the toilet:No Moving around from place to place: No In the past year have you fallen or had a near fall?:No   Are you sexually active?  No  Do you have more than one partner?  No  Hearing Difficulties: Yes - hearing aids - received 2 months ago Do you often ask people to speak up or repeat themselves? No Do you  experience ringing or noises in your ears? No Do you have difficulty understanding soft or whispered voices? No   Do you feel that you have a problem with memory? No  Do you often misplace items? No  Do you feel safe at home?  Yes  Cognitive Testing  Alert? Yes  Normal Appearance?Yes  Oriented to person? Yes  Place? Yes   Time? Yes  Recall of three objects?  Yes  Can perform simple calculations? Yes  Displays appropriate judgment?Yes  Can read the correct time from a watch face?Yes   Advanced Directives have been discussed with the patient? Yes  List the Names of Other Physician/Practitioners you currently  use: 1.  Cardiology - Dr Shirlee Latch 2.  GI - Dr. Jarold Motto, [facility] any recent Medical Services you may have received from other than [facility] providers in the past year (date may be approximate).  Immunization History  Administered Date(s) Administered  . Td 04/19/2006  . Zoster 12/24/2009   Patient reports having reaction to Zoster vaccine and has been advised by PCP not to get influenza or pneumonia vaccine  Screening Tests Health Maintenance  Topic Date Due  . DEXA SCAN  07/09/2006  . INFLUENZA VACCINE  06/10/2015 (Originally 05/26/2014)  . TETANUS/TDAP  04/19/2016  . MAMMOGRAM  07/31/2016  . COLONOSCOPY  09/09/2022  . PNEUMOCOCCAL POLYSACCHARIDE VACCINE AGE 16 AND OVER  Addressed  . ZOSTAVAX  Completed    All answers were reviewed with the patient and necessary referrals were made:  Henrene Pastor, [facility]   11/07/2014   History reviewed: allergies, current medications, past family history, past medical history, past social history, past surgical history and problem list   Objective:     Body mass index is 23.35 kg/(m^2). BP 120/60 mmHg  Pulse 67  Ht 5' 0.25" (1.53 m)  Wt 120 lb 8 oz (54.658 kg)  BMI 23.35 kg/m2   Max Lifetime Height:    DEXA Results Date of Test T-Score for AP Spine L1-L4 T-Score for Total Left Hip T-Score for Total Right Hip   11/07/2014 -0.9 -1.4 -1.5  03/14/2008 -1.0 -1.2 -1.5  03/07/2007 -1.1 -1.3 -1.5       T-Score at  Neck of right hip was -2.5 today (lowest was -2.6 in 2009)   Assessment:     Initial Medicare Wellness Visit Ostoeporosis     Plan:     During the course of the visit the patient was educated and counseled about appropriate screening and preventive services including:    Pneumococcal vaccine - declined due to past reaction from immunization  Influenza vaccine - declined due to past reaction from immuniations  Td vaccine - UTD  Zostavax / shingles vaccine - completed   Screening mammography - UTD  Screening Pap smear and pelvic exam - UTD  Bone densitometry screening - done today  Colorectal cancer screening - UTD  Diabetes screening - UTD  Glaucoma screening -UTD  Advanced directives: has NO advanced directive  - patient given information packet today in office  Continue evista  daily    continue calcium  daily through supplementation or diet.   Recommend vitamin D level with next labs  continue weight bearing exercise - 30 minutes at least 4 days per week.     Counseled and educated about fall risk and prevention.  Recheck DEXA:  2 years  Diet review for nutrition referral? Yes ____  Not Indicated __X__   Patient Instructions (the written plan) was given to the patient.  Medicare Attestation I have personally reviewed: The patient's medical and social history Their use of alcohol, tobacco or illicit drugs Their current medications and supplements The patient's functional ability including ADLs,fall risks, home safety risks, cognitive, and hearing and visual impairment Diet and physical activities Evidence for depression or mood disorders  The patient's weight, height, BMI, and HR/BP have been recorded in the chart.  I have made referrals, counseling, and provided education to the patient based on review of the above and I have provided the  patient with a written personalized care plan for preventive services.     Henrene Pastor, [facility]   11/07/2014

## 2014-12-05 ENCOUNTER — Ambulatory Visit: Payer: Medicare Other | Admitting: Family Medicine

## 2014-12-06 ENCOUNTER — Encounter: Payer: Self-pay | Admitting: Family Medicine

## 2014-12-06 ENCOUNTER — Ambulatory Visit (INDEPENDENT_AMBULATORY_CARE_PROVIDER_SITE_OTHER): Payer: Medicare Other | Admitting: Family Medicine

## 2014-12-06 ENCOUNTER — Encounter (INDEPENDENT_AMBULATORY_CARE_PROVIDER_SITE_OTHER): Payer: Self-pay

## 2014-12-06 VITALS — BP 134/66 | HR 75 | Temp 98.9°F | Ht 60.25 in | Wt 120.0 lb

## 2014-12-06 DIAGNOSIS — L209 Atopic dermatitis, unspecified: Secondary | ICD-10-CM

## 2014-12-06 DIAGNOSIS — I10 Essential (primary) hypertension: Secondary | ICD-10-CM

## 2014-12-06 DIAGNOSIS — M810 Age-related osteoporosis without current pathological fracture: Secondary | ICD-10-CM

## 2014-12-06 DIAGNOSIS — K589 Irritable bowel syndrome without diarrhea: Secondary | ICD-10-CM

## 2014-12-06 DIAGNOSIS — J302 Other seasonal allergic rhinitis: Secondary | ICD-10-CM

## 2014-12-06 DIAGNOSIS — E559 Vitamin D deficiency, unspecified: Secondary | ICD-10-CM

## 2014-12-06 MED ORDER — HYOSCYAMINE SULFATE 0.125 MG PO TABS: 0.1250 mg | ORAL_TABLET | ORAL | Status: DC | PRN

## 2014-12-06 NOTE — Patient Instructions (Addendum)
Medicare Annual Wellness Visit  [address] and the medical providers at [facility] strive to bring you the best medical care.  In doing so we not only want to address your current medical conditions and concerns but also to detect new conditions early and prevent illness, disease and health-related problems.    Medicare offers a yearly Wellness Visit which allows our clinical staff to assess your need for preventative services including immunizations, lifestyle education, counseling to decrease risk of preventable diseases and screening for fall risk and other medical concerns.    This visit is provided free of charge (no copay) for all Medicare recipients. The clinical pharmacists at [facility] have begun to conduct these Wellness Visits which will also include a thorough review of all your medications.    As you primary medical provider recommend that you make an appointment for your Annual Wellness Visit if you have not done so already this year.  You may set up this appointment before you leave today or you may call back [phone] and schedule an appointment.  Please make sure when you call that you mention that you are scheduling your Annual Wellness Visit with the clinical pharmacist so that the appointment may be made for the proper length of time.     Continue current medications. Continue good therapeutic lifestyle changes which include good diet and exercise. Fall precautions discussed with patient. If an FOBT was given today- please return it to our front desk. If you are over 74 years old - you may need Prevnar 13 or the adult Pneumonia vaccine.  Flu Shots are still available at our office. If you still haven't had one please call to set up a nurse visit to get one.   After your visit with us today you will receive a survey in the mail or online from [employer] regarding your care with us. Please take a moment to  fill this out. Your feedback is very important to us as you can help us better understand your patient needs as well as improve your experience and satisfaction. WE CARE ABOUT YOU!!!   Continue to watch her milk and dairy products and restart this as tolerated Continue to watch her caffeine intake Drink plenty of water and liquids We will arrange for you to see the gastroenterologist, Dr. Erick BlinksJay Pyrtle regarding her IBS and abdominal symptoms Use cortisone 10 on the ears sparingly as needed for itching Use hairspray that is unscented and/or cover the ears when using Continue to use nasal saline during the day and nasal saline gel at night along with the Flonase. Direct the Flonase toward the ear and not against the nasal septum Also continue to use cool mist humidification and keep the house as cool as possible

## 2014-12-06 NOTE — Progress Notes (Signed)
Subjective:    Patient ID: Dorothy Pitts, female    DOB: 01/27/1941, 74 y.o.   MRN: [id number]  HPI Pt here for follow up and management of chronic medical problems which includes hypertension. She is taking medications regularly. The patient is complaining today of sinus pressure and sneezing. She has a history of allergic rhinitis. She also complains that her ear canals are dry and itching. She is now wearing hearing aids which have helped her considerably. She continues to have some abdominal pain off and on. 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 Health maintenance why she is due to return an FOBT and she will come back for fasting lab work.       Patient Active Problem List   Diagnosis Date Noted  . Osteoporosis 11/07/2014  . Diverticulitis large intestine 12/01/2013  . Hypokalemia 10/08/2013  . Anemia 10/08/2013  . Acute diverticulitis 10/07/2013  . 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 N&V (nausea and vomiting) 10/07/2013  . Abdominal pain, acute, left lower quadrant 10/07/2013  . UTI (lower urinary tract infection) 10/07/2013  . Diarrhea 10/07/2013  . Diverticulitis 10/07/2013  . Diverticulosis of colon (without mention of hemorrhage)   . Hypertension   . IBS (irritable bowel syndrome)    Outpatient Encounter Prescriptions as of 12/06/2014  Medication Sig  . Ascorbic Acid (VITAMIN C) 1000 MG tablet Take 1,000 mg by mouth daily.   . Cholecalciferol (VITAMIN D3) 2000 UNITS TABS Take 1 tablet by mouth daily.  . enalapril (VASOTEC) 5 MG tablet TAKE 1 TABLET BY MOUTH EVERY DAY AS DIRECTED  . fluticasone (FLONASE) 50 MCG/ACT nasal spray Place 2 sprays into both nostrils daily.  . Multiple Vitamin (MULTIVITAMIN) tablet Take 1 tablet by mouth daily.  [name] Kitchen OVER THE COUNTER MEDICATION Take 1 capsule by mouth 3 (three) times daily. Digestive health cap. - ALIGN  . OVER THE COUNTER MEDICATION Digest Basic - digestive  enzymes  . raloxifene (EVISTA) 60 MG tablet TAKE 1 TABLET BY MOUTH ONCE A DAY  . spironolactone (ALDACTONE) 25 MG tablet TAKE 2 TABLETS BY MOUTH EVERY MORNING  . zolpidem (AMBIEN) 5 MG tablet TAKE 1 TABLET AT BEDTIME (Patient taking differently: at bedtime as needed. TAKE 1 TABLET AT BEDTIME)    Review of Systems  Constitutional: Negative.   HENT: Positive for sinus pressure and sneezing.         Bilateral Ear -dry- itching  Eyes: Negative.   Respiratory: Negative.   Cardiovascular: Negative.   Gastrointestinal: Positive for abdominal pain (off/ on ).  Endocrine: Negative.   Genitourinary: Negative.   Musculoskeletal: Negative.   Skin: Negative.   Allergic/Immunologic: Negative.   Neurological: Negative.   Hematological: Negative.   Psychiatric/Behavioral: Negative.        Objective:   Physical Exam  Constitutional: She is oriented to person, place, and time. She appears well-developed and well-nourished.  HENT:  Head: Normocephalic and atraumatic.  Right Ear: External ear normal.  Left Ear: External ear normal.  Nose: Nose normal.  Mouth/Throat: Oropharynx is clear and moist.  There is minimal nasal congestion bilaterally and the patient is wearing hearing aids bilaterally.  Eyes: Conjunctivae and EOM are normal. Pupils are equal, round, and reactive to light. Right eye exhibits no discharge. Left eye exhibits no discharge. No scleral icterus.  Neck: Normal range of motion. Neck supple. No thyromegaly present.  The neck is without bruits or adenopathy  Cardiovascular: Normal rate, regular rhythm, normal  heart sounds and intact distal pulses.  Exam reveals no gallop and no friction rub.   No murmur heard. The heart has a regular rate and rhythm at 72/m.  Pulmonary/Chest: Effort normal and breath sounds normal. No respiratory distress. She has no wheezes. She has no rales. She exhibits no tenderness.  Lungs are clear anteriorly and posteriorly.  Abdominal: Soft. Bowel sounds  are normal. She exhibits no mass. There is no tenderness. There is no rebound and no guarding.  There is minimal tenderness bilaterally. The abdomen is somewhat protuberant and this appears to be secondary to the midline abdominal incision which was done twice. She normally wears a girdle but one of me to see the bulging abdomen today. She has been doing exercises to tighten the abdominal muscles at physical therapy and she is going to continue to do this.  Musculoskeletal: Normal range of motion. She exhibits no edema or tenderness.  Lymphadenopathy:    She has no cervical adenopathy.  Neurological: She is alert and oriented to person, place, and time. She has normal reflexes. No cranial nerve deficit.  Skin: Skin is warm and dry. No rash noted.  The skin was somewhat dry and there was no rash in the area of itching on the ears.  Psychiatric: She has a normal mood and affect. Her behavior is normal. Judgment and thought content normal.  Nursing note and vitals reviewed.  BP 134/66 mmHg  Pulse 75  Temp(Src) 98.9 F (37.2 C) (Oral)  Ht 5' 0.25" (1.53 m)  Wt 120 lb (54.432 kg)  BMI 23.25 kg/m2        Assessment & Plan:  1. Essential hypertension -The blood pressure is under good control and she should continue to take her current medicines and continue to watch her sodium intake - POCT CBC; Future - BMP8+EGFR; Future - NMR, lipoprofile; Future - Hepatic function panel; Future  2. Osteoporosis -Continue with vitamin D and Evista - POCT CBC; Future  3. Vitamin D deficiency -Continue with current vitamin D dosing contingent upon lab work which is to be done - POCT CBC; Future - Vit D  25 hydroxy (rtn osteoporosis monitoring); Future  4. IBS (irritable bowel syndrome) -Continue with hyosamin, watching diet for caffeine and milk cheese ice cream and dairy product - Ambulatory referral to Gastroenterology  5. Other seasonal allergic rhinitis -Continue with nasal saline mist and  gel and using Flonase at nighttime -Continue to use cool mist humidification and keep the house as cool as possible and drink plenty of fluids  6. Mild atopic dermatitis -Use cortisone 10 -Avoid soaps that are scented  Meds ordered this encounter  Medications  . hyoscyamine (LEVSIN, ANASPAZ) 0.125 MG tablet    Sig: Take 1 tablet (0.125 mg total) by mouth every 4 (four) hours as needed.    Dispense:  30 tablet    Refill:  2   Patient Instructions                       Medicare Annual Wellness Visit  [facility] and the medical providers at [facility] strive to bring you the best medical care.  In doing so we not only want to address your current medical conditions and concerns but also to detect new conditions early and prevent illness, disease and health-related problems.    Medicare offers a yearly Wellness Visit which allows our clinical staff to assess your need for preventative services including immunizations, lifestyle education, counseling  to decrease risk of preventable diseases and screening for fall risk and other medical concerns.    This visit is provided free of charge (no copay) for all Medicare recipients. The clinical pharmacists at [facility] have begun to conduct these Wellness Visits which will also include a thorough review of all your medications.    As you primary medical provider recommend that you make an appointment for your Annual Wellness Visit if you have not done so already this year.  You may set up this appointment before you leave today or you may call back ([phone]) and schedule an appointment.  Please make sure when you call that you mention that you are scheduling your Annual Wellness Visit with the clinical pharmacist so that the appointment may be made for the proper length of time.     Continue current medications. Continue good therapeutic lifestyle changes which include good diet and exercise. Fall  precautions discussed with patient. If an FOBT was given today- please return it to our front desk. If you are over 34 years old - you may need Prevnar 40 or the adult Pneumonia vaccine.  Flu Shots are still available at our office. If you still haven't had one please call to set up a nurse visit to get one.   After your visit with [place] today you will receive a survey in the mail or online from [employer] regarding your care with [place]. Please take a moment to fill this out. Your feedback is very important to [place] as you can help [place] better understand your patient needs as well as improve your experience and satisfaction. WE CARE ABOUT YOU!!!   Continue to watch her milk and dairy products and restart this as tolerated Continue to watch her caffeine intake Drink plenty of water and liquids We will arrange for you to see the gastroenterologist, Dr. Zenovia Jarred regarding her IBS and abdominal symptoms Use cortisone 10 on the ears sparingly as needed for itching Use hairspray that is unscented and/or cover the ears when using Continue to use nasal saline during the day and nasal saline gel at night along with the Flonase. Direct the Flonase toward the ear and not against the nasal septum Also continue to use cool mist humidification and keep the house as cool as possible   Arrie Senate MD

## 2014-12-18 ENCOUNTER — Encounter: Payer: Self-pay | Admitting: Internal Medicine

## 2014-12-18 ENCOUNTER — Other Ambulatory Visit (INDEPENDENT_AMBULATORY_CARE_PROVIDER_SITE_OTHER): Payer: Medicare Other

## 2014-12-18 DIAGNOSIS — E559 Vitamin D deficiency, unspecified: Secondary | ICD-10-CM

## 2014-12-18 DIAGNOSIS — M810 Age-related osteoporosis without current pathological fracture: Secondary | ICD-10-CM | POA: Diagnosis not present

## 2014-12-18 DIAGNOSIS — I10 Essential (primary) hypertension: Secondary | ICD-10-CM | POA: Diagnosis not present

## 2014-12-18 LAB — POCT CBC
GRANULOCYTE PERCENT: 51.1 % (ref 37–80)
HEMATOCRIT: 41 % (ref 37.7–47.9)
HEMOGLOBIN: 12.5 g/dL (ref 12.2–16.2)
Lymph, poc: 1.7 (ref 0.6–3.4)
MCH, POC: 29 pg (ref 27–31.2)
MCHC: 30.5 g/dL — AB (ref 31.8–35.4)
MCV: 95 fL (ref 80–97)
MPV: 7.7 fL (ref 0–99.8)
POC Granulocyte: 2 (ref 2–6.9)
POC LYMPH PERCENT: 42 %L (ref 10–50)
Platelet Count, POC: 259 10*3/uL (ref 142–424)
RBC: 4.31 M/uL (ref 4.04–5.48)
RDW, POC: 12.5 %
WBC: 4 10*3/uL — AB (ref 4.6–10.2)

## 2014-12-18 NOTE — Progress Notes (Signed)
Lab only 

## 2014-12-19 LAB — BMP8+EGFR
BUN / CREAT RATIO: 18 (ref 11–26)
BUN: 13 mg/dL (ref 8–27)
CHLORIDE: 100 mmol/L (ref 97–108)
CO2: 26 mmol/L (ref 18–29)
Calcium: 9.4 mg/dL (ref 8.7–10.3)
Creatinine, Ser: 0.73 mg/dL (ref 0.57–1.00)
GFR calc Af Amer: 94 mL/min/{1.73_m2} (ref >59)
GFR, EST NON AFRICAN AMERICAN: 82 mL/min/{1.73_m2} (ref >59)
GLUCOSE: 102 mg/dL — AB (ref 65–99)
Potassium: 4 mmol/L (ref 3.5–5.2)
Sodium: 138 mmol/L (ref 134–144)

## 2014-12-19 LAB — HEPATIC FUNCTION PANEL
ALBUMIN: 3.9 g/dL (ref 3.5–4.8)
ALT: 9 IU/L (ref 0–32)
AST: 15 IU/L (ref 0–40)
Alkaline Phosphatase: 43 IU/L (ref 39–117)
BILIRUBIN TOTAL: 0.4 mg/dL (ref 0.0–1.2)
Bilirubin, Direct: 0.11 mg/dL (ref 0.00–0.40)
Total Protein: 6 g/dL (ref 6.0–8.5)

## 2014-12-19 LAB — NMR, LIPOPROFILE
Cholesterol: 222 mg/dL — ABNORMAL HIGH (ref 100–199)
HDL CHOLESTEROL BY NMR: 74 mg/dL (ref >39)
HDL Particle Number: 47.1 umol/L (ref >=30.5)
LDL Particle Number: 1570 nmol/L — ABNORMAL HIGH (ref <1000)
LDL Size: 21.1 nm (ref >20.5)
LDL-C: 128 mg/dL — ABNORMAL HIGH (ref 0–99)
LP-IR Score: 31 (ref <=45)
SMALL LDL PARTICLE NUMBER: 465 nmol/L (ref <=527)
TRIGLYCERIDES BY NMR: 101 mg/dL (ref 0–149)

## 2014-12-19 LAB — VITAMIN D 25 HYDROXY (VIT D DEFICIENCY, FRACTURES): Vit D, 25-Hydroxy: 33.5 ng/mL (ref 30.0–100.0)

## 2015-01-03 ENCOUNTER — Telehealth: Payer: Self-pay | Admitting: Family Medicine

## 2015-01-03 NOTE — Telephone Encounter (Signed)
Patient was offered an appointment at 3:00 with [facility] but is unable to get here patient is going to try urgent care.

## 2015-01-16 ENCOUNTER — Telehealth: Payer: Self-pay | Admitting: Family Medicine

## 2015-01-16 NOTE — Telephone Encounter (Signed)
Pt given appt tomorrow with Lynwood Dawleyiffany Gann at 8 am.

## 2015-01-17 ENCOUNTER — Encounter: Payer: Self-pay | Admitting: Physician Assistant

## 2015-01-17 ENCOUNTER — Ambulatory Visit (INDEPENDENT_AMBULATORY_CARE_PROVIDER_SITE_OTHER): Payer: Medicare Other | Admitting: Physician Assistant

## 2015-01-17 VITALS — BP 122/64 | HR 74 | Temp 99.0°F | Ht 60.25 in | Wt 121.8 lb

## 2015-01-17 DIAGNOSIS — S81802A Unspecified open wound, left lower leg, initial encounter: Secondary | ICD-10-CM | POA: Diagnosis not present

## 2015-01-17 NOTE — Progress Notes (Signed)
   Subjective:    Patient ID: Dorothy Pitts, female    DOB: 12-29-1940, 74 y.o.   MRN: [id number]  HPI 74 y/o female presents with lesion on LLE s/p fall on curling iron plug 12 days ago. She visited Urgent Care and was given Bactrim DS q day x 7 days. Went back 5 days ago and was given Clindamycin and Mupirocin. She is concerned and wants to make sure it is healing appropriately.      Review of Systems  Constitutional: Negative.   Gastrointestinal: Negative.   Skin: Positive for wound (LLE  - shin).       Objective:   Physical Exam  Skin: There is erythema (mild, appears to be disipating ).  Abrasion/wound LLE, shin, dry necrotic tissue around edges          Assessment & Plan:  1. Wound LLE - Necrotic tissue debrided and redressed with Mupirocin and nonstick gauze. Continue to change dressing daily. Cleanse with dove soap and water with gauze. Apply mupirocin BID, Vaseline q day. Finish antibiotic. F/u in 1 week for recheck.

## 2015-01-17 NOTE — Patient Instructions (Signed)
1. Wash with [facility] Soap for sensitive skin and water, Using Gauze to get rid of any scabbing or dead skin (Gently) 2. Change dressing daily 3. Apply Mupirocin (Bactroban) twice daily 4. Apply Vaseline (non scented) once daily   If any red streaking or increased pain occurs, report to ER or f/u in clinic. Redness surrounding wound should be decreasing. If it increases, report to clinic.   F/U in 1 week for recheck.

## 2015-01-23 ENCOUNTER — Encounter: Payer: Self-pay | Admitting: *Deleted

## 2015-01-24 ENCOUNTER — Encounter: Payer: Self-pay | Admitting: Physician Assistant

## 2015-01-24 ENCOUNTER — Ambulatory Visit (INDEPENDENT_AMBULATORY_CARE_PROVIDER_SITE_OTHER): Payer: Medicare Other | Admitting: Physician Assistant

## 2015-01-24 VITALS — BP 123/67 | HR 70 | Temp 97.5°F | Ht 60.25 in | Wt 119.6 lb

## 2015-01-24 DIAGNOSIS — S81801D Unspecified open wound, right lower leg, subsequent encounter: Secondary | ICD-10-CM | POA: Diagnosis not present

## 2015-01-24 MED ORDER — MUPIROCIN 2 % EX OINT: 1.0000 "application " | TOPICAL_OINTMENT | Freq: Two times a day (BID) | CUTANEOUS | Status: DC

## 2015-01-24 NOTE — Progress Notes (Signed)
   Subjective:    Patient ID: [name], female    DOB: July 24, 1941, 74 y.o.   MRN: [id number]  HPI 74 y/o female presents for f/u on wound of RLE, shin. Completed antibiotic. Has been dressing and using topical mupirocin and vaseline as prescribed.     Review of Systems  Skin: Positive for wound (shin, rle).       Objective:   Physical Exam  Skin:  Decreased erythema around wound. Abrasion with central ulceration continues to heal on right shin. Dressing C/D/I with no drainage present. Granulation tissue around edges with no necrotic tissue. Healing well.   Nursing note and vitals reviewed.         Assessment & Plan:  1. Wound RLE: Continue vaseline, mupirocin BID. Non adhesive guaze. Clean with dove soap and water BID. F/U in 3 weeks for recheck or sooner if inflammation, pain occurs.

## 2015-02-04 IMAGING — CR DG FOOT COMPLETE 3+V*L*
3 series · 3 of 3 positions shown · non-contrast
Comparison: None.

CLINICAL DATA: Left ankle pain

EXAM:
LEFT FOOT - COMPLETE 3+ VIEW

[view not recorded (1 of 3)]
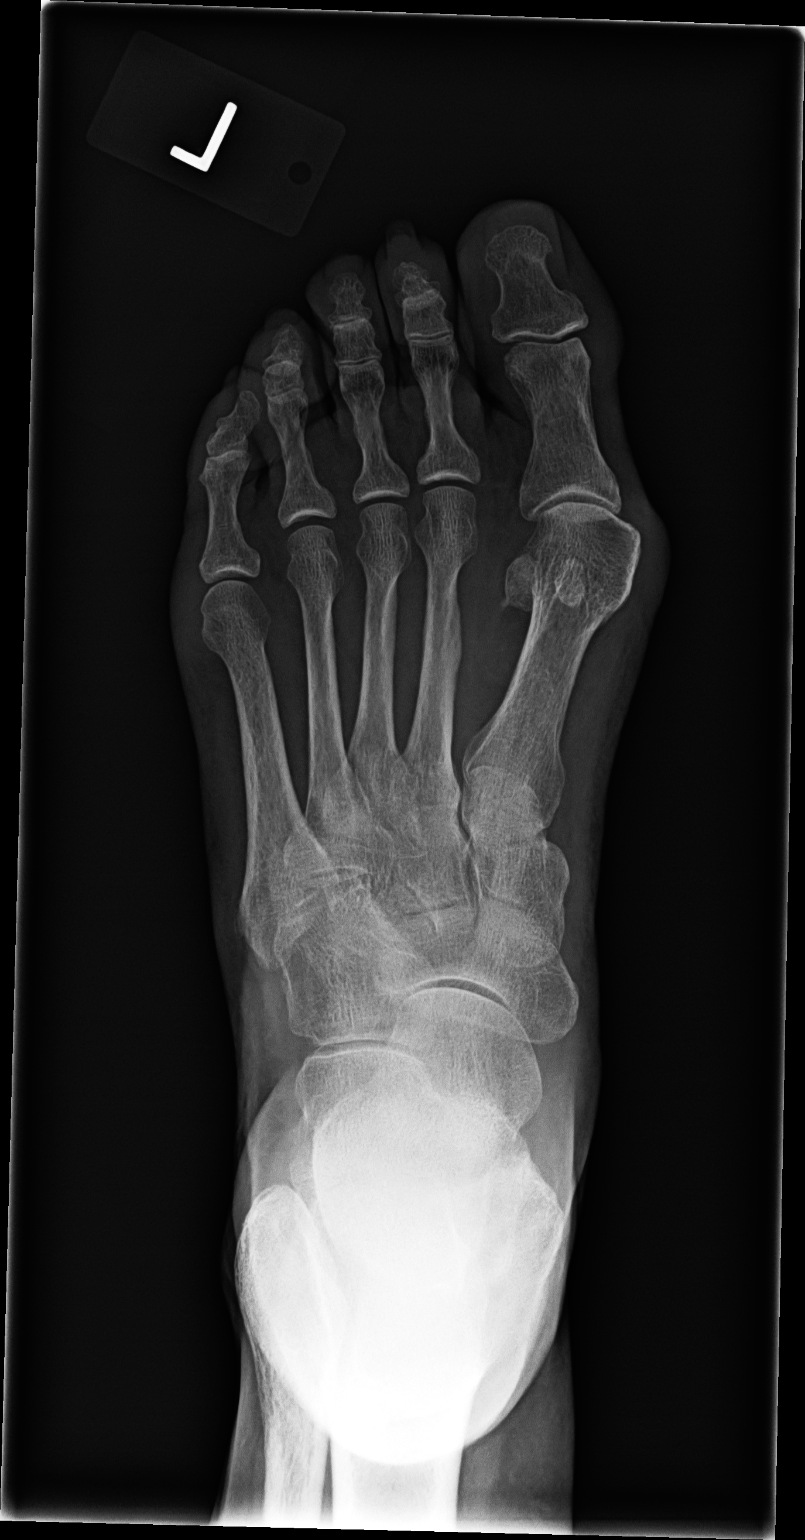

[view not recorded (2 of 3)]
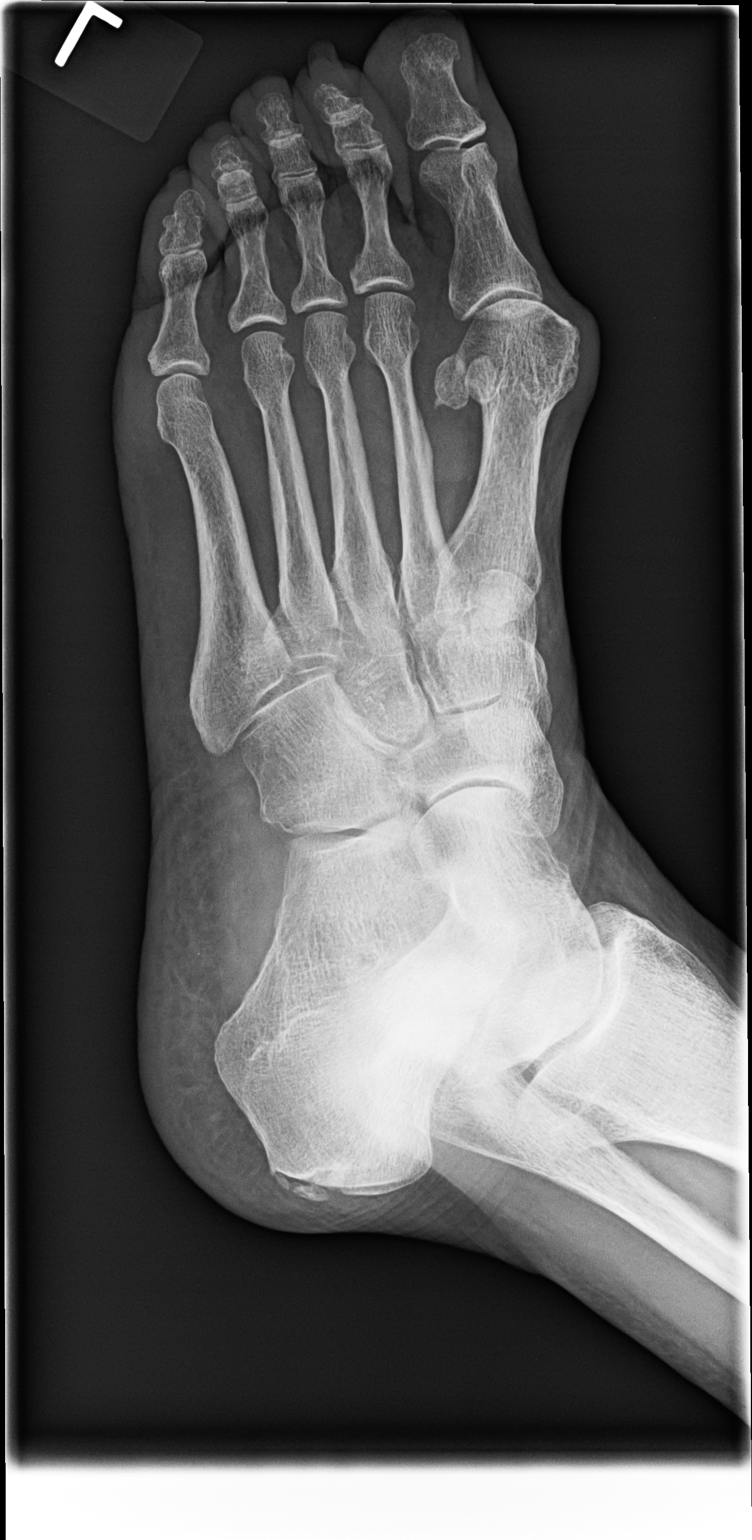

[view not recorded (3 of 3)]
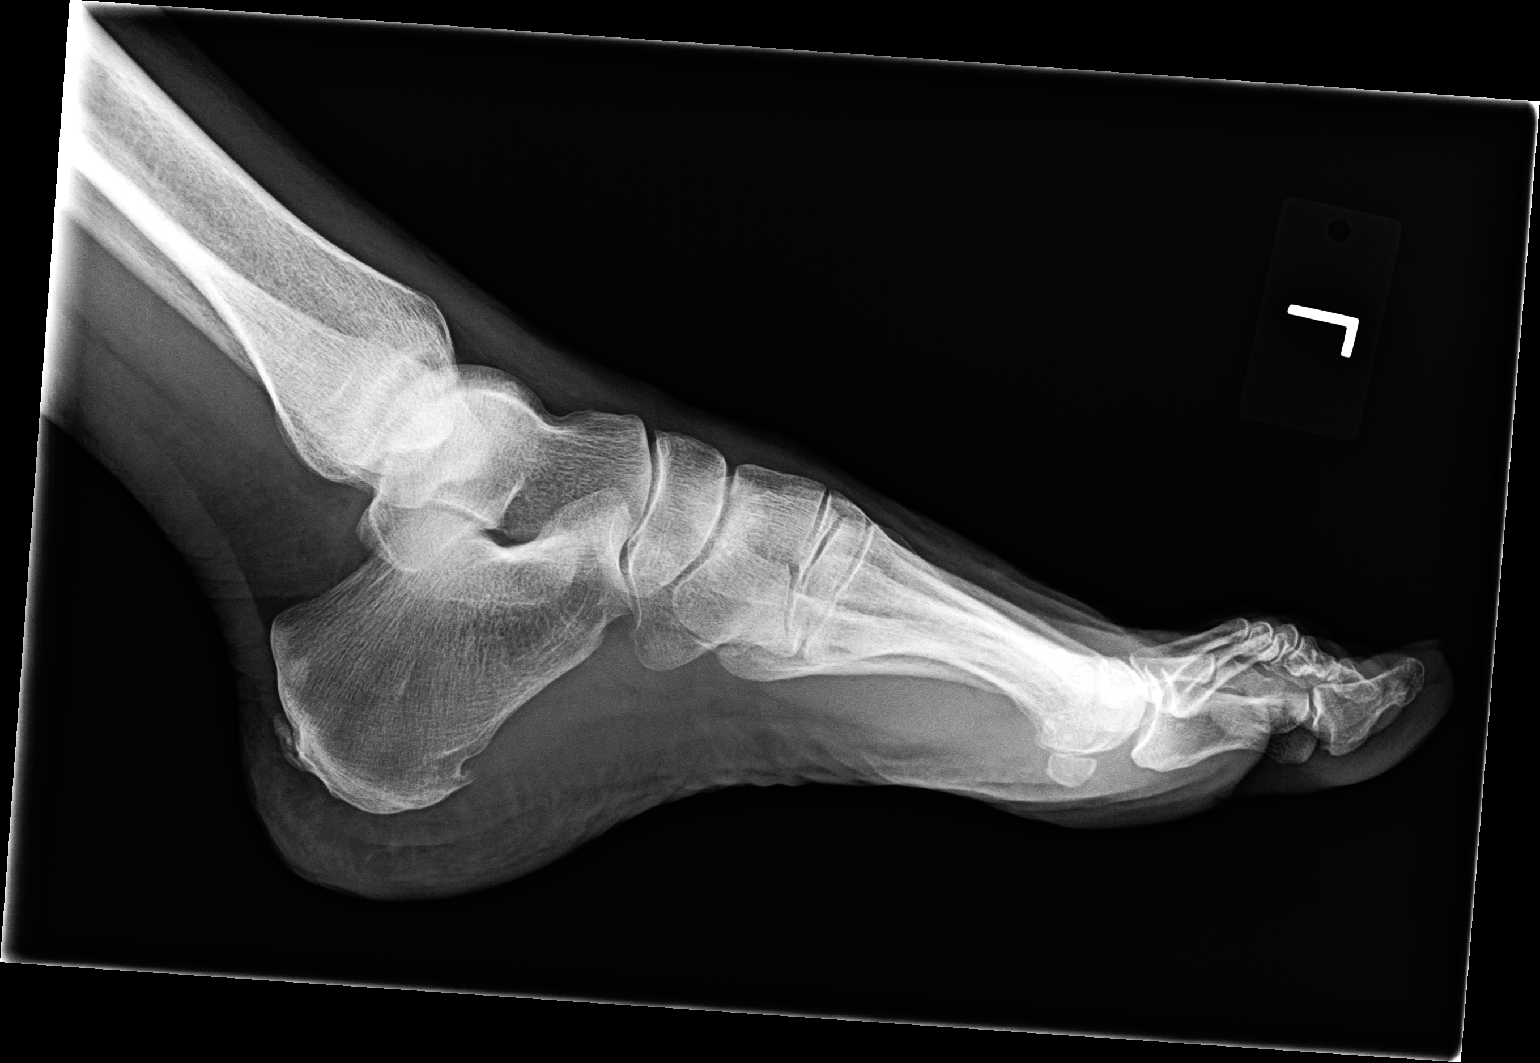

[3 of 3 positions shown; findings below may reference images not displayed]

FINDINGS: The left foot demonstrates no fracture or dislocation. There is
hallux valgus of the left foot with mild degenerative change of the
first MTP joint. There is a tiny plantar calcaneal spur. There is no
soft tissue abnormality. There is no subcutaneous emphysema or
radiopaque foreign bodies.
IMPRESSION: No acute osseous injury of the left foot.

## 2015-02-07 ENCOUNTER — Ambulatory Visit (INDEPENDENT_AMBULATORY_CARE_PROVIDER_SITE_OTHER): Payer: Medicare Other | Admitting: Internal Medicine

## 2015-02-07 ENCOUNTER — Encounter: Payer: Self-pay | Admitting: Internal Medicine

## 2015-02-07 VITALS — BP 144/72 | HR 68 | Ht 62.5 in | Wt 120.6 lb

## 2015-02-07 DIAGNOSIS — K589 Irritable bowel syndrome without diarrhea: Secondary | ICD-10-CM

## 2015-02-07 DIAGNOSIS — K432 Incisional hernia without obstruction or gangrene: Secondary | ICD-10-CM | POA: Diagnosis not present

## 2015-02-07 DIAGNOSIS — K649 Unspecified hemorrhoids: Secondary | ICD-10-CM

## 2015-02-07 DIAGNOSIS — K219 Gastro-esophageal reflux disease without esophagitis: Secondary | ICD-10-CM | POA: Diagnosis not present

## 2015-02-07 NOTE — Patient Instructions (Signed)
Please continue your probiotic. Alternate between [employer] Medialign and Florastor every other month.  Please use compressive therapy for your incisional hernia (spanks, binder, girdle)  Avoid dairy products.  You may try ezekiel bread (since you are trying gluten free)  We have given you a handout regarding Benefiber and Balenol.  Please follow up with Dr Rhea BeltonPyrtle in 6 months.

## 2015-02-07 NOTE — Progress Notes (Signed)
Subjective:    Patient ID: Dorothy Pitts, female    DOB: 12/30/1940, 74 y.o.   MRN: [id number]  HPI Dorothy Pitts is a 74 year old female with a past medical history of diverticulosis and diverticulitis status post hand-assisted sigmoid resection with primary anastomosis in February 2015, IBS, GERD, hemorrhoids, hypertension and hyperlipidemia who is seen for follow-up. She is here alone today. She was previously seen and managed by Dr. Sheryn Bison. She sees Dr. Christell Constant for primary care. She reports overall she is doing well. She is troubled by incisional hernia in her lower abdomen. This is not painful and is asymptomatic but bothers her because it bulges out. His is been most noticeable since her last surgery in February 2015. She has a long-standing history of irritable bowel which for her Kcanconsist of constipation and at other times loose stools. She does at times have issues with gas and bloating. She knows that milk is a trigger for her irritable bowel symptoms. She tries to avoid gluten and caffeine. She reports a good appetite without nausea or vomiting. No abdominal pain. Some heartburn which she controls very well with diet. She takes Zantac on an as-needed basis. No dysphagia, odynophagia or early satiety. She does use align as a probiotic and digestive enzymes over-the-counter. She is not always consistent with taking these but when she does she feels better. History of hemorrhoids worsened by constipation. Not currently a problem. Denies bleeding or melena. Does have skin tags but denies seepage or leakage. Occasionally they do prolapse and self reduce.  Her last colonoscopy was performed on 09/09/2012 by Dr. Jarold Motto. This was normal to the cecum except for severe diverticulosis in the left colon. Repeat colonoscopy was recommended in 10 years at which she would be 74 years old. This colonoscopy was done prior to sigmoid resection.  Review of Systems As per history of present  illness, otherwise negative  Current Medications, Allergies, Past Medical History, Past Surgical History, Family History and Social History were reviewed in [employer] record.     Objective:   Physical Exam BP 144/72 mmHg  Pulse 68  Ht 5' 2.5" (1.588 m)  Wt 120 lb 9.6 oz (54.704 kg)  BMI 21.69 kg/m2 Constitutional: Well-developed and well-nourished. No distress. HEENT: Normocephalic and atraumatic. Oropharynx is clear and moist. No oropharyngeal exudate. Conjunctivae are normal.  No scleral icterus. Neck: Neck supple. Trachea midline. Cardiovascular: Normal rate, regular rhythm and intact distal pulses. No M/R/G Pulmonary/chest: Effort normal and breath sounds normal. No wheezing, rales or rhonchi. Abdominal: Soft, nontender, nondistended. Bowel sounds active throughout. Well-healed midline incision from umbilicus to above pubic symphysis, with patient supine there is laxity in the last third of the incision line consistent with hernia, withstanding this is prominent but nontender  Extremities: no clubbing, cyanosis, or edema Lymphadenopathy: No cervical adenopathy noted. Neurological: Alert and oriented to person place and time. Skin: Skin is warm and dry. No rashes noted. Psychiatric: Normal mood and affect. Behavior is normal.  CBC    Component Value Date/Time   WBC 4.0* 12/18/2014 0900   WBC 6.4 12/06/2013 0523   RBC 4.31 12/18/2014 0900   RBC 3.08* 12/06/2013 0523   HGB 12.5 12/18/2014 0900   HGB 10.1* 12/06/2013 0523   HCT 41.0 12/18/2014 0900   HCT 29.7* 12/06/2013 0523   PLT 344 12/06/2013 0523   MCV 95.0 12/18/2014 0900   MCV 96.4 12/06/2013 0523   MCH 29.0 12/18/2014 0900   MCH 32.8 12/06/2013 0523  MCHC 30.5* 12/18/2014 0900   MCHC 34.0 12/06/2013 0523   RDW 12.7 12/06/2013 0523   LYMPHSABS 1.5 11/27/2013 1056   MONOABS 0.3 11/27/2013 1056   EOSABS 0.1 11/27/2013 1056   BASOSABS 0.0 11/27/2013 1056    CMP     Component Value  Date/Time   NA 138 12/18/2014 0836   NA 137 12/06/2013 0523   K 4.0 12/18/2014 0836   CL 100 12/18/2014 0836   [name] 26 12/18/2014 0836   GLUCOSE 102* 12/18/2014 0836   GLUCOSE 115* 12/06/2013 0523   BUN 13 12/18/2014 0836   BUN 10 12/06/2013 0523   CREATININE 0.73 12/18/2014 0836   CALCIUM 9.4 12/18/2014 0836   PROT 6.0 12/18/2014 0836   PROT 5.2* 10/09/2013 0503   ALBUMIN 2.5* 10/09/2013 0503   AST 15 12/18/2014 0836   ALT 9 12/18/2014 0836   ALKPHOS 43 12/18/2014 0836   BILITOT 0.4 12/18/2014 0836   BILITOT 0.3 06/26/2014 0852   GFRNONAA 82 12/18/2014 0836   GFRAA 94 12/18/2014 0836      Assessment & Plan:  74 year old female with a past medical history of diverticulosis and diverticulitis status post hand-assisted sigmoid resection with primary anastomosis in February 2015, IBS, GERD, hemorrhoids, hypertension and hyperlipidemia who is seen for follow-up.   1. Incision hernia -- this is asymptomatic but she is bothered by its appearance. She wears a girdle which helps. I think that compressive devices such as a girdle or abdominal binder is the best treatment. Would recommend against surgery given this is not causing troublesome symptoms. It was seen at the time of CT scan last year, this scan was reviewed. She is happy with this advice and does not wish for surgery  2. IBS -- alternating diarrhea and constipation. I think she needs to be more consistent with probiotic on a daily basis. Over-the-counter enzyme replacement likely beneficial as well. I've asked that she continue to avoid dairy foods. I'm not convinced that she needs to avoid gluten but may benefit from avoiding processed wheats. I recommended Benefiber on a daily basis 1 tablespoon daily. Try to avoid breads and other processed foods may improve overall irritable bowel symptoms. We discussed this at length today  3. Hemorrhoids -- not currently symptomatic but she does report some perianal irritation. I recommended she  use Balenol after each bowel movement.  4. GERD -- diet controlled without alarm symptoms. Okay for when necessary Zantac  5. CRC screening -- up-to-date, normal colonoscopy without polyps in 2013, repeat would be recommended in 2023 but will be based on her overall well-being and health at that time  Return in 6-9 months, sooner as needed

## 2015-02-09 IMAGING — CR DG ABDOMEN 1V
1 series · 1 of 1 positions shown · non-contrast
Comparison: 01/12/2013, CT abdomen and pelvis 10/07/2013

CLINICAL DATA: Postoperative ileus

EXAM:
ABDOMEN - 1 VIEW

[view not recorded]
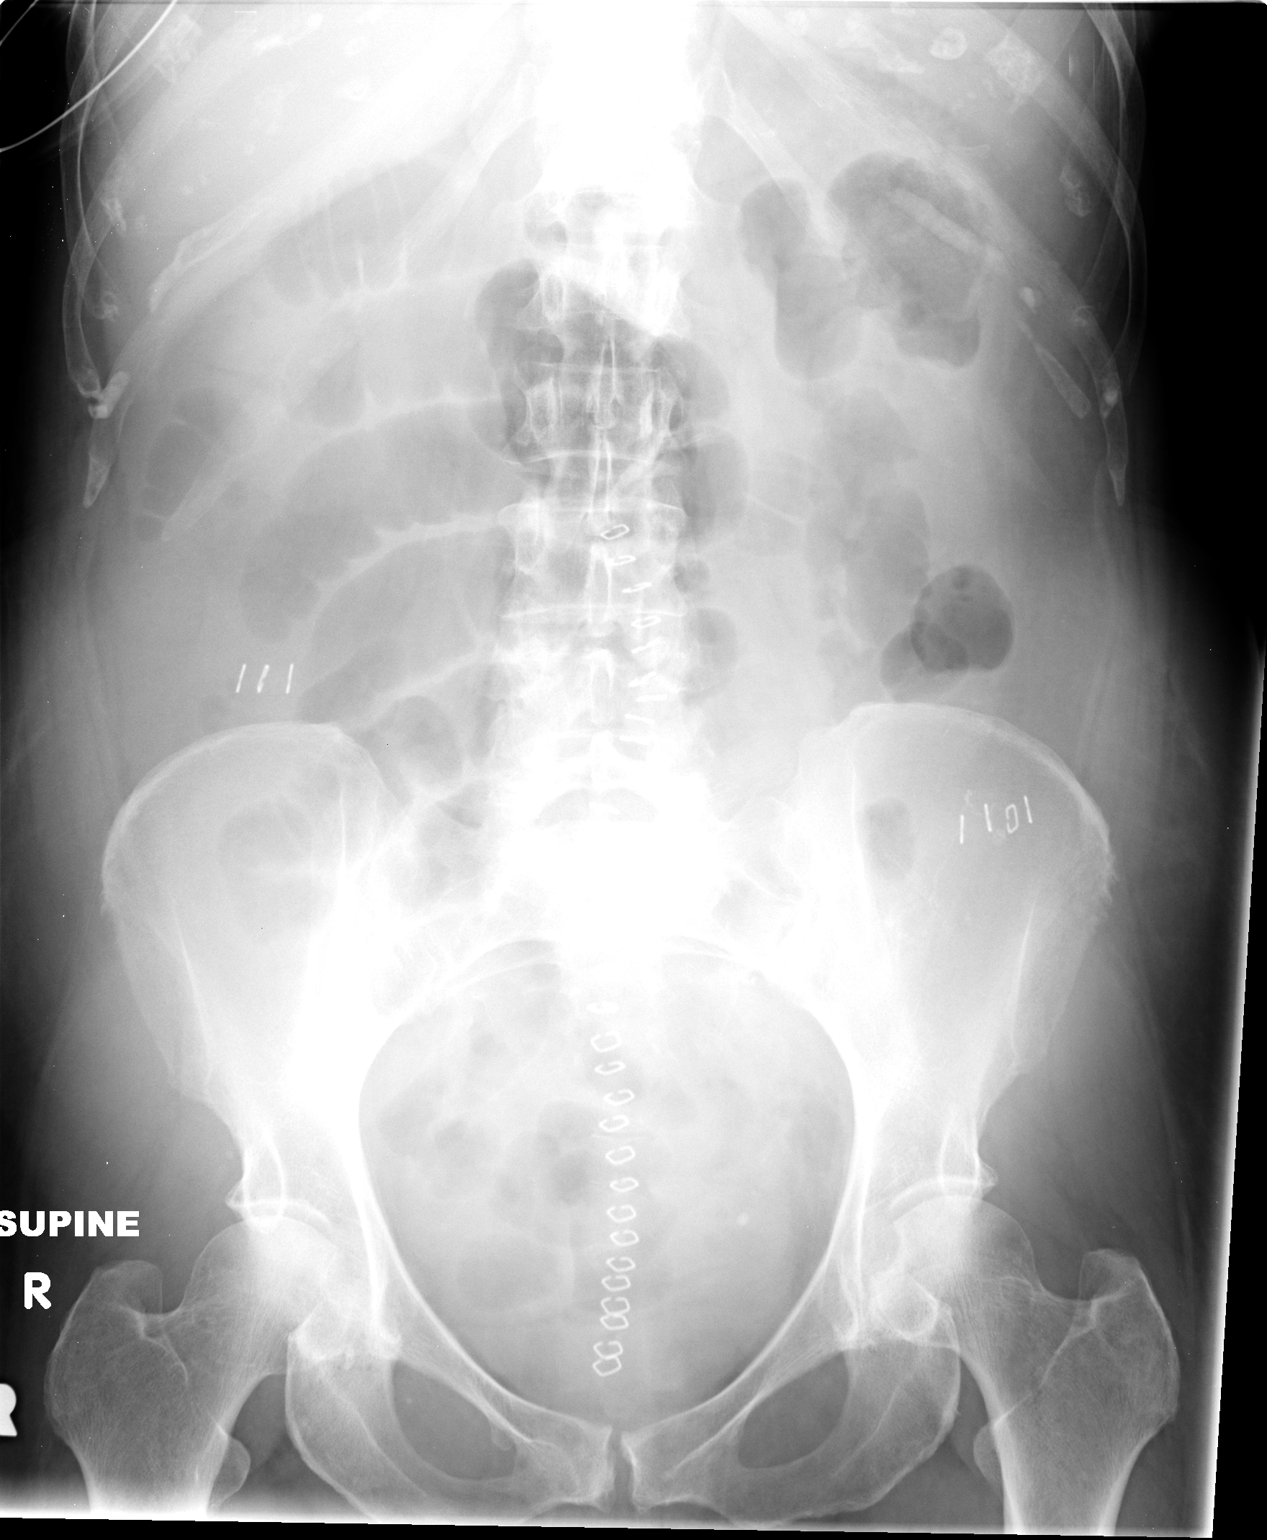

[1 of 1 positions shown; findings below may reference images not displayed]

FINDINGS: Scattered skin clips from prior surgery.

Air-filled loops of mildly distended small bowel are seen throughout
abdomen which could represent a postoperative ileus.

Minimal colonic gas.

No bowel wall thickening.

Small left pelvic phlebolith.

Bones unremarkable.
IMPRESSION: Air-filled distended loops of small bowel throughout abdomen
consistent with postoperative ileus.

## 2015-02-14 ENCOUNTER — Encounter: Payer: Self-pay | Admitting: Physician Assistant

## 2015-02-14 ENCOUNTER — Ambulatory Visit (INDEPENDENT_AMBULATORY_CARE_PROVIDER_SITE_OTHER): Payer: Medicare Other | Admitting: Physician Assistant

## 2015-02-14 VITALS — BP 133/65 | HR 76 | Temp 98.0°F | Ht 62.5 in | Wt 119.0 lb

## 2015-02-14 DIAGNOSIS — S81802D Unspecified open wound, left lower leg, subsequent encounter: Secondary | ICD-10-CM | POA: Diagnosis not present

## 2015-02-14 NOTE — Progress Notes (Signed)
   Subjective:    Patient ID: Dorothy Pitts, female    DOB: 01-22-1941, 74 y.o.   MRN: [id number]  HPI 74 y/o female presents for follow up of wound on Left shin. She has been treating it with Mupirocin ointment BID and vaseline q day. She feels that it has improved.    Review of Systems  Skin:       Healing wound on LLE, shin       Objective:   Physical Exam  Skin:  Bandaid c/d/i with no drainage. Abrasion appears to be healing well. Decreased erythema and hyperpigmentation. No indication of infection.           Assessment & Plan:  1. Wound of left lower extremity, subsequent encounter -Keep covered for protection with bandaid - Use vaseline or mupirocin once daily  - f/u prn       Tiffany A. Chauncey ReadingGann PA-C

## 2015-02-26 ENCOUNTER — Encounter: Payer: Self-pay | Admitting: Nurse Practitioner

## 2015-02-26 ENCOUNTER — Ambulatory Visit (INDEPENDENT_AMBULATORY_CARE_PROVIDER_SITE_OTHER): Payer: Medicare Other | Admitting: Nurse Practitioner

## 2015-02-26 VITALS — BP 133/62 | HR 76 | Temp 97.9°F | Ht 62.5 in | Wt 120.0 lb

## 2015-02-26 DIAGNOSIS — B373 Candidiasis of vulva and vagina: Secondary | ICD-10-CM | POA: Diagnosis not present

## 2015-02-26 DIAGNOSIS — R350 Frequency of micturition: Secondary | ICD-10-CM | POA: Diagnosis not present

## 2015-02-26 DIAGNOSIS — B3731 Acute candidiasis of vulva and vagina: Secondary | ICD-10-CM

## 2015-02-26 DIAGNOSIS — N3001 Acute cystitis with hematuria: Secondary | ICD-10-CM

## 2015-02-26 LAB — POCT UA - MICROSCOPIC ONLY
CASTS, UR, LPF, POC: NEGATIVE
Crystals, Ur, HPF, POC: NEGATIVE
Mucus, UA: NEGATIVE

## 2015-02-26 LAB — POCT URINALYSIS DIPSTICK
Glucose, UA: NEGATIVE
KETONES UA: NEGATIVE
Nitrite, UA: NEGATIVE
PH UA: 6.5
SPEC GRAV UA: 1.01
Urobilinogen, UA: NEGATIVE

## 2015-02-26 MED ORDER — CIPROFLOXACIN HCL 500 MG PO TABS
500.0000 mg | ORAL_TABLET | Freq: Two times a day (BID) | ORAL | Status: DC
Start: 2015-02-26 — End: 2015-02-26

## 2015-02-26 MED ORDER — FLUCONAZOLE 150 MG PO TABS: 150.0000 mg | ORAL_TABLET | Freq: Once | ORAL | Status: DC

## 2015-02-26 MED ORDER — SULFAMETHOXAZOLE-TRIMETHOPRIM 800-160 MG PO TABS: 1.0000 | ORAL_TABLET | Freq: Two times a day (BID) | ORAL | Status: DC

## 2015-02-26 NOTE — Patient Instructions (Signed)
Take medication as prescribe Cotton underwear Take shower not bath Cranberry juice, yogurt Force fluids AZO over the counter X2 days Culture pending RTO prn  

## 2015-02-26 NOTE — Progress Notes (Signed)
  Subjective:    Clearance CootsJudith M Rubano is a 74 y.o. female who complains of abnormal smelling urine, burning with urination, frequency, incomplete bladder emptying, nocturia and urgency. She has had symptoms for 4 days. Patient also complains of back pain. Patient denies vaginal discharge. Patient does have a history of recurrent UTI. Patient does not have a history of pyelonephritis.   The following portions of the patient's history were reviewed and updated as appropriate: allergies, current medications, past family history, past medical history, past social history, past surgical history and problem list.  Review of Systems Pertinent items are noted in HPI.    Objective:    Ht 5' 2.5" (1.588 m)  Wt 120 lb (54.432 kg)  BMI 21.59 kg/m2 Lungs: clear to auscultation bilaterally Heart: regular rate and rhythm, S1, S2 normal, no murmur, click, rub or gallop Abdomen: slight suprapubic pain on palpation no CVA tenderness  Laboratory:  Results for orders placed or performed in visit on 02/26/15  POCT urinalysis dipstick  Result Value Ref Range   Color, UA yellow    Clarity, UA clear    Glucose, UA neg    Bilirubin, UA eng    Ketones, UA neg    Spec Grav, UA 1.010    Blood, UA mod    pH, UA 6.5    Protein, UA 2++    Urobilinogen, UA negative    Nitrite, UA neg    Leukocytes, UA moderate (2+)   POCT UA - Microscopic Only  Result Value Ref Range   WBC, Ur, HPF, POC 20-30    RBC, urine, microscopic 5-10    Bacteria, U Microscopic occ    Mucus, UA neg    Epithelial cells, urine per micros occ    Crystals, Ur, HPF, POC neg    Casts, Ur, LPF, POC neg    Yeast, UA occ     Assessment:    Acute cystitis and Vulvovaginitis     Plan:  1. Urinary frequency - POCT urinalysis dipstick - POCT UA - Microscopic Only  2. Acute cystitis with hematuria Take medication as prescribe Cotton underwear Take shower not bath Cranberry juice, yogurt Force fluids AZO over the counter X2  days Culture pending RTO prn Meds ordered this encounter  Medications  . DISCONTD: ciprofloxacin (CIPRO) 500 MG tablet    Sig: Take 1 tablet (500 mg total) by mouth 2 (two) times daily.    Dispense:  20 tablet    Refill:  0    Order Specific Question:  Supervising Provider    Answer:  Ernestina PennaMOORE, DONALD W [[id number]]  . fluconazole (DIFLUCAN) 150 MG tablet    Sig: Take 1 tablet (150 mg total) by mouth once.    Dispense:  1 tablet    Refill:  0    Order Specific Question:  Supervising Provider    Answer:  Ernestina PennaMOORE, DONALD W [[id number]]  . sulfamethoxazole-trimethoprim (BACTRIM DS) 800-160 MG per tablet    Sig: Take 1 tablet by mouth 2 (two) times daily.    Dispense:  14 tablet    Refill:  0    Order Specific Question:  Supervising Provider    Answer:  Ernestina PennaMOORE, DONALD W [[id number]]    - Urine culture  3. Vaginal candidiasis - fluconazole (DIFLUCAN) 150 MG tablet; Take 1 tablet (150 mg total) by mouth once.  Dispense: 1 tablet; Refill: 0   Mary-Margaret Daphine DeutscherMartin, FNP

## 2015-03-01 ENCOUNTER — Other Ambulatory Visit: Payer: Self-pay | Admitting: Nurse Practitioner

## 2015-03-01 LAB — URINE CULTURE

## 2015-03-01 MED ORDER — NITROFURANTOIN MONOHYD MACRO 100 MG PO CAPS: 100.0000 mg | ORAL_CAPSULE | Freq: Two times a day (BID) | ORAL | Status: DC

## 2015-03-04 ENCOUNTER — Telehealth: Payer: Self-pay | Admitting: Nurse Practitioner

## 2015-03-05 ENCOUNTER — Telehealth: Payer: Self-pay | Admitting: Family Medicine

## 2015-03-05 NOTE — Telephone Encounter (Signed)
I spoke with patient and reviewed urine culture with patient and advised her to stop the bactrim and start the macrobid.

## 2015-03-05 NOTE — Telephone Encounter (Signed)
Patient was calling and wanting to know what she can use for a antacid with the antibiotic she was on. I advised patient to call the pharmacy and see what would be the best choice to take with the macrobid.

## 2015-03-07 ENCOUNTER — Telehealth: Payer: Self-pay | Admitting: Family Medicine

## 2015-03-07 MED ORDER — ONDANSETRON HCL 4 MG PO TABS: 4.0000 mg | ORAL_TABLET | Freq: Three times a day (TID) | ORAL | Status: DC | PRN

## 2015-03-07 NOTE — Telephone Encounter (Signed)
Patient aware.

## 2015-03-07 NOTE — Telephone Encounter (Signed)
zofran sent in for nausea

## 2015-03-07 NOTE — Telephone Encounter (Signed)
Patient states that the antibiotic she is taking is making her extremely nauseated and wants to know if you can give her something for nausea to help.

## 2015-03-08 ENCOUNTER — Ambulatory Visit (INDEPENDENT_AMBULATORY_CARE_PROVIDER_SITE_OTHER): Payer: Medicare Other | Admitting: Family Medicine

## 2015-03-08 ENCOUNTER — Encounter: Payer: Self-pay | Admitting: Family Medicine

## 2015-03-08 VITALS — BP 125/65 | HR 91 | Temp 99.9°F | Ht 62.5 in | Wt 120.0 lb

## 2015-03-08 DIAGNOSIS — R300 Dysuria: Secondary | ICD-10-CM | POA: Diagnosis not present

## 2015-03-08 DIAGNOSIS — N3090 Cystitis, unspecified without hematuria: Secondary | ICD-10-CM

## 2015-03-08 DIAGNOSIS — I8312 Varicose veins of left lower extremity with inflammation: Secondary | ICD-10-CM | POA: Diagnosis not present

## 2015-03-08 DIAGNOSIS — T50905A Adverse effect of unspecified drugs, medicaments and biological substances, initial encounter: Secondary | ICD-10-CM

## 2015-03-08 DIAGNOSIS — T887XXA Unspecified adverse effect of drug or medicament, initial encounter: Secondary | ICD-10-CM

## 2015-03-08 DIAGNOSIS — I872 Venous insufficiency (chronic) (peripheral): Secondary | ICD-10-CM

## 2015-03-08 LAB — POCT URINALYSIS DIPSTICK
Bilirubin, UA: NEGATIVE
GLUCOSE UA: NEGATIVE
Ketones, UA: NEGATIVE
Nitrite, UA: NEGATIVE
PROTEIN UA: NEGATIVE
Spec Grav, UA: 1.015
UROBILINOGEN UA: NEGATIVE
pH, UA: 5

## 2015-03-08 LAB — POCT UA - MICROSCOPIC ONLY
Bacteria, U Microscopic: NEGATIVE
CASTS, UR, LPF, POC: NEGATIVE
CRYSTALS, UR, HPF, POC: NEGATIVE
MUCUS UA: NEGATIVE
Yeast, UA: NEGATIVE

## 2015-03-08 MED ORDER — AMOXICILLIN-POT CLAVULANATE 875-125 MG PO TABS: 1.0000 | ORAL_TABLET | Freq: Two times a day (BID) | ORAL | Status: DC

## 2015-03-08 MED ORDER — CEFTRIAXONE SODIUM 1 G IJ SOLR
1.0000 g | Freq: Once | INTRAMUSCULAR | Status: AC
  Administered 2015-03-08: 1 g via INTRAMUSCULAR

## 2015-03-08 NOTE — Progress Notes (Signed)
Subjective:  Patient ID: Dorothy Pitts, female    DOB: [birth year]  Age: 74 y.o. MRN: [id number]  CC: No chief complaint on file.   HPI PAW KARSTENS presents for ear hurt, itch. Used friend's clobetasol earlier today with good relief. Throat hurts since last night. Took macrobid with excessive nausea, so given zofran which caused her teeth and jaws and throat to hurt.  History Devanee has a past medical history of Hyperlipidemia; Hypertension; IBS (irritable bowel syndrome); GERD (gastroesophageal reflux disease); Cataract; Hemorrhoids; Diverticulosis of colon (without mention of hemorrhage); Acute diverticulitis (10/07/2013); UTI (lower urinary tract infection) (10/07/2013); PONV (postoperative nausea and vomiting); Osteopenia; Osteoporosis; Vitamin D deficiency; and Bowel obstruction.   She has past surgical history that includes Appendectomy; Partial hysterectomy; Bilateral oophorectomy; Hemorrhoid surgery; Breast lumpectomy (Left); Tonsillectomy; Cataract extraction; Colon resection (N/A, 12/01/2013); Eye surgery; and Abdominal hysterectomy.   Her family history includes Heart attack (age of onset: 26) in her mother; Heart disease (age of onset: 13) in her brother; Hypertension in her mother; Obesity in her brother; Parkinson's disease in her father. There is no history of Colon cancer.She reports that she quit smoking about 12 years ago. Her smoking use included Cigarettes. She has a 35 pack-year smoking history. She has never used smokeless tobacco. She reports that she drinks about 3.0 oz of alcohol per week. She reports that she does not use illicit drugs.  Outpatient Prescriptions Prior to Visit  Medication Sig Dispense Refill  . Ascorbic Acid (VITAMIN C) 1000 MG tablet Take 1,000 mg by mouth daily.     . Cholecalciferol (VITAMIN D3) 2000 UNITS TABS Take 1 tablet by mouth daily.    . enalapril (VASOTEC) 5 MG tablet TAKE 1 TABLET BY MOUTH EVERY DAY AS DIRECTED 90 tablet 3  .  fluconazole (DIFLUCAN) 150 MG tablet Take 1 tablet (150 mg total) by mouth once. 1 tablet 0  . fluticasone (FLONASE) 50 MCG/ACT nasal spray Place 2 sprays into both nostrils daily. 16 g 6  . hyoscyamine (LEVSIN, ANASPAZ) 0.125 MG tablet Take 1 tablet (0.125 mg total) by mouth every 4 (four) hours as needed. 30 tablet 2  . Multiple Vitamin (MULTIVITAMIN) tablet Take 1 tablet by mouth daily.    . mupirocin ointment (BACTROBAN) 2 % Apply 1 application topically 2 (two) times daily. 22 g 0  . ondansetron (ZOFRAN) 4 MG tablet Take 1 tablet (4 mg total) by mouth every 8 (eight) hours as needed for nausea or vomiting. 20 tablet 0  . OVER THE COUNTER MEDICATION Take 1 capsule by mouth 3 (three) times daily. Digestive health cap. - ALIGN    . OVER THE COUNTER MEDICATION Digest Basic - digestive enzymes    . raloxifene (EVISTA) 60 MG tablet TAKE 1 TABLET BY MOUTH ONCE A DAY 90 tablet 3  . spironolactone (ALDACTONE) 25 MG tablet TAKE 2 TABLETS BY MOUTH EVERY MORNING 180 tablet 3  . sulfamethoxazole-trimethoprim (BACTRIM DS) 800-160 MG per tablet Take 1 tablet by mouth 2 (two) times daily. 14 tablet 0  . zolpidem (AMBIEN) 5 MG tablet TAKE 1 TABLET AT BEDTIME (Patient taking differently: at bedtime as needed. TAKE 1 TABLET AT BEDTIME) 30 tablet 4  . nitrofurantoin, macrocrystal-monohydrate, (MACROBID) 100 MG capsule Take 1 capsule (100 mg total) by mouth 2 (two) times daily. 1 po BId 14 capsule 0   No facility-administered medications prior to visit.    ROS Review of Systems  Constitutional: Negative for fever, chills, diaphoresis, appetite change, fatigue and unexpected  weight change.  HENT: Positive for congestion, ear pain, sore throat and trouble swallowing. Negative for hearing loss, postnasal drip, rhinorrhea and sneezing. Ear discharge: and itch.   Eyes: Negative for pain.  Respiratory: Negative for cough, chest tightness and shortness of breath.   Cardiovascular: Negative for chest pain and  palpitations.  Gastrointestinal: Negative for nausea, vomiting, abdominal pain, diarrhea and constipation.  Genitourinary: Negative for dysuria, frequency and menstrual problem.  Musculoskeletal: Negative for joint swelling and arthralgias.  Skin: Positive for color change (reddish just inferior to the mid left shin where previous injury has recently healed). Negative for rash.  Neurological: Negative for dizziness, weakness, numbness and headaches.  Psychiatric/Behavioral: Negative for dysphoric mood and agitation.    Objective:  BP 125/65 mmHg  Pulse 91  Temp(Src) 99.9 F (37.7 C) (Oral)  Ht 5' 2.5" (1.588 m)  Wt 120 lb (54.432 kg)  BMI 21.59 kg/m2  BP Readings from Last 3 Encounters:  03/08/15 125/65  02/26/15 133/62  02/14/15 133/65    Wt Readings from Last 3 Encounters:  03/08/15 120 lb (54.432 kg)  02/26/15 120 lb (54.432 kg)  02/14/15 119 lb (53.978 kg)     Physical Exam  Constitutional: She is oriented to person, place, and time. She appears well-developed and well-nourished. No distress.  HENT:  Head: Normocephalic and atraumatic.  Right Ear: External ear normal.  Left Ear: External ear normal.  Nose: Nose normal.  Mouth/Throat: Oropharynx is clear and moist.  Eyes: Conjunctivae and EOM are normal. Pupils are equal, round, and reactive to light.  Neck: Normal range of motion. Neck supple. No thyromegaly present.  Cardiovascular: Normal rate, regular rhythm and normal heart sounds.   No murmur heard. Pulmonary/Chest: Effort normal and breath sounds normal. No respiratory distress. She has no wheezes. She has no rales.  Abdominal: Soft. Bowel sounds are normal. She exhibits no distension. There is no tenderness.  Lymphadenopathy:    She has no cervical adenopathy.  Neurological: She is alert and oriented to person, place, and time. She has normal reflexes.  Skin: Skin is warm and dry. Rash (minimal erythema inferior to healing area of new skin on mid left shin)  noted.  Psychiatric: She has a normal mood and affect. Her behavior is normal. Judgment and thought content normal.    Lab Results  Component Value Date   HGBA1C 5.2% 06/28/2014    Lab Results  Component Value Date   WBC 4.0* 12/18/2014   HGB 12.5 12/18/2014   HCT 41.0 12/18/2014   PLT 344 12/06/2013   GLUCOSE 102* 12/18/2014   CHOL 222* 12/18/2014   TRIG 101 12/18/2014   HDL 74 12/18/2014   ALT 9 54/09/811902/23/2016   AST 15 12/18/2014   NA 138 12/18/2014   K 4.0 12/18/2014   CL 100 12/18/2014   CREATININE 0.73 12/18/2014   BUN 13 12/18/2014   [name] 26 12/18/2014   HGBA1C 5.2% 06/28/2014    Ct Abdomen Pelvis Wo Contrast  06/29/2014   CLINICAL DATA:  Abdominal distention, colon surgery in February for diverticular disease  EXAM: CT ABDOMEN AND PELVIS WITHOUT CONTRAST  TECHNIQUE: Multidetector CT imaging of the abdomen and pelvis was performed following the standard protocol without IV contrast.  COMPARISON:  CT abdomen pelvis of 10/07/2013  FINDINGS: The lung bases are clear. The liver is unremarkable in the unenhanced state with no change in a probable cyst within the anterior left lobe of liver. No calcified gallstones are seen. The pancreas is normal in size  and the pancreatic duct is not dilated. The adrenal glands and spleen are unremarkable. Splenic artery calcifications are noted. The stomach is relatively distended with contrast and is unremarkable. No renal calculi are seen and there is no evidence of hydronephrosis. The proximal ureters are normal in caliber. Moderate atheromatous change is noted within the abdominal aorta. No adenopathy is seen.  The urinary bladder is not well distended. The uterus has previously been resected. No adnexal lesion is seen and no fluid is noted within the pelvis. There are diverticula throughout the rectosigmoid colon. Surgical sutures are noted at the junction of the distal descending and proximal sigmoid colon with no narrowing evident.  There does  appear to be slightly more distention of proximal small bowel loops relative to the distal ileal loops. A partial small bowel obstruction is a consideration. The change in caliber appears to be within the anterior mid upper pelvis where there is slight angulation of small bowel possibly due to an adhesion. A small lower abdominal -pelvic wall hernia is noted containing a portion of the distal ileum without definite obstruction. This hernia is located approximately 10 cm below the umbilicus. Mild degenerative disc disease is present at L5-S1.  IMPRESSION: 1. Slightly distended proximal small bowel with some change in caliber within the mid anterior upper pelvis suggestive of a low-grade partial small bowel obstruction. Probable adhesion. 2. Small herniation of the lower abdominal - upper pelvic abdominal wall containing a nondilated loop of small bowel.   Electronically Signed   By: Dwyane DeePaul  Barry M.D.   On: 06/29/2014 14:51    Assessment & Plan:   Diagnoses and all orders for this visit:  Dysuria Orders: -     POCT urinalysis dipstick -     POCT UA - Microscopic Only -     Urine culture -     cefTRIAXone (ROCEPHIN) injection 1 g; Inject 1 g into the muscle once.  Cystitis  Medication reaction, initial encounter  Venous stasis dermatitis of left lower extremity  Other orders -     amoxicillin-clavulanate (AUGMENTIN) 875-125 MG per tablet; Take 1 tablet by mouth 2 (two) times daily. Take all of this medication   I am having Ms. Etherington start on amoxicillin-clavulanate. I am also having her maintain her multivitamin, vitamin C, Vitamin D3, OVER THE COUNTER MEDICATION, zolpidem, spironolactone, raloxifene, enalapril, fluticasone, OVER THE COUNTER MEDICATION, hyoscyamine, mupirocin ointment, fluconazole, sulfamethoxazole-trimethoprim, and ondansetron. We administered cefTRIAXone.  Meds ordered this encounter  Medications  . amoxicillin-clavulanate (AUGMENTIN) 875-125 MG per tablet    Sig: Take  1 tablet by mouth 2 (two) times daily. Take all of this medication    Dispense:  20 tablet    Refill:  0  . cefTRIAXone (ROCEPHIN) injection 1 g    Sig:      Follow-up: Return if symptoms worsen or fail to improve.  Mechele ClaudeWarren Eisa Necaise, M.D.

## 2015-03-08 NOTE — Patient Instructions (Signed)
wear support stockings to support circulation in legs

## 2015-03-11 ENCOUNTER — Other Ambulatory Visit: Payer: Self-pay | Admitting: Family Medicine

## 2015-03-11 LAB — URINE CULTURE

## 2015-03-14 ENCOUNTER — Other Ambulatory Visit (INDEPENDENT_AMBULATORY_CARE_PROVIDER_SITE_OTHER): Payer: Medicare Other

## 2015-03-14 ENCOUNTER — Other Ambulatory Visit: Payer: Medicare Other

## 2015-03-14 DIAGNOSIS — R300 Dysuria: Secondary | ICD-10-CM | POA: Diagnosis not present

## 2015-03-14 LAB — POCT URINALYSIS DIPSTICK
Bilirubin, UA: NEGATIVE
Glucose, UA: NEGATIVE
Ketones, UA: NEGATIVE
Leukocytes, UA: NEGATIVE
NITRITE UA: NEGATIVE
PH UA: 8
PROTEIN UA: NEGATIVE
Spec Grav, UA: 1.01
Urobilinogen, UA: NEGATIVE

## 2015-03-14 LAB — POCT UA - MICROSCOPIC ONLY
Bacteria, U Microscopic: NEGATIVE
Casts, Ur, LPF, POC: NEGATIVE
Crystals, Ur, HPF, POC: NEGATIVE
Mucus, UA: NEGATIVE
RBC, urine, microscopic: NEGATIVE
WBC, UR, HPF, POC: NEGATIVE
Yeast, UA: NEGATIVE

## 2015-03-14 NOTE — Progress Notes (Signed)
Lab only 

## 2015-03-20 ENCOUNTER — Other Ambulatory Visit: Payer: Self-pay | Admitting: Family Medicine

## 2015-03-20 ENCOUNTER — Telehealth: Payer: Self-pay | Admitting: Family Medicine

## 2015-03-20 DIAGNOSIS — R350 Frequency of micturition: Secondary | ICD-10-CM

## 2015-03-20 NOTE — Telephone Encounter (Signed)
Please check clean catch midstream urinalysis as soon as possible and discontinue antibiotic if having diarrhea

## 2015-03-21 ENCOUNTER — Other Ambulatory Visit (INDEPENDENT_AMBULATORY_CARE_PROVIDER_SITE_OTHER): Payer: Medicare Other

## 2015-03-21 DIAGNOSIS — R350 Frequency of micturition: Secondary | ICD-10-CM | POA: Diagnosis not present

## 2015-03-21 LAB — POCT UA - MICROSCOPIC ONLY
CASTS, UR, LPF, POC: NEGATIVE
CRYSTALS, UR, HPF, POC: NEGATIVE
MUCUS UA: NEGATIVE
YEAST UA: NEGATIVE

## 2015-03-21 LAB — POCT URINALYSIS DIPSTICK
BILIRUBIN UA: NEGATIVE
GLUCOSE UA: NEGATIVE
Ketones, UA: NEGATIVE
Leukocytes, UA: NEGATIVE
NITRITE UA: NEGATIVE
PROTEIN UA: NEGATIVE
SPEC GRAV UA: 1.01
Urobilinogen, UA: NEGATIVE
pH, UA: 6.5

## 2015-03-21 NOTE — Telephone Encounter (Signed)
Last seen 03/08/15 Dr Darlyn ReadStacks  If approved route to nurse to call into [facility]

## 2015-03-21 NOTE — Telephone Encounter (Signed)
Patient aware and will come by for urine and orders have been placed.

## 2015-03-21 NOTE — Progress Notes (Signed)
Lab only 

## 2015-03-22 ENCOUNTER — Other Ambulatory Visit: Payer: Self-pay | Admitting: Family Medicine

## 2015-03-22 NOTE — Telephone Encounter (Signed)
Please review and advise.

## 2015-03-22 NOTE — Telephone Encounter (Signed)
RX called into [facility] Okayed per Dr Christell ConstantMoore

## 2015-03-23 LAB — URINE CULTURE

## 2015-03-30 ENCOUNTER — Telehealth: Payer: Self-pay | Admitting: *Deleted

## 2015-03-30 MED ORDER — NITROFURANTOIN MONOHYD MACRO 100 MG PO CAPS: 100.0000 mg | ORAL_CAPSULE | Freq: Two times a day (BID) | ORAL | Status: DC

## 2015-03-30 NOTE — Telephone Encounter (Signed)
Pt notified of results Verbalizes understanding RX sent into pharmacy per Dr Christell ConstantMoore

## 2015-03-30 NOTE — Telephone Encounter (Signed)
-----   Message from Ernestina Pennaonald W Moore, MD sent at 03/24/2015 12:39 PM EDT ----- The urine culture is still growing out a significant amount of Escherichia coli.----The previous antibiotic that she was apparently intolerant to because of diarrhea was sulfa and this particular antibiotic did not cover the Escherichia coli. When she gets back from her trip to [place], she should take a course of Macrobid twice daily with food for 7 days and then recheck a urinalysis+++++++ please text her and make sure that she drinks plenty of liquids while on this trip

## 2015-04-02 ENCOUNTER — Ambulatory Visit (INDEPENDENT_AMBULATORY_CARE_PROVIDER_SITE_OTHER): Payer: Medicare Other | Admitting: Family Medicine

## 2015-04-02 ENCOUNTER — Encounter: Payer: Self-pay | Admitting: Family Medicine

## 2015-04-02 ENCOUNTER — Telehealth: Payer: Self-pay | Admitting: Family Medicine

## 2015-04-02 VITALS — BP 93/55 | HR 87 | Temp 98.4°F | Ht 62.5 in | Wt 119.8 lb

## 2015-04-02 DIAGNOSIS — R300 Dysuria: Secondary | ICD-10-CM

## 2015-04-02 LAB — POCT UA - MICROSCOPIC ONLY
CASTS, UR, LPF, POC: NEGATIVE
Crystals, Ur, HPF, POC: NEGATIVE
MUCUS UA: NEGATIVE
Yeast, UA: NEGATIVE

## 2015-04-02 LAB — POCT URINALYSIS DIPSTICK
BILIRUBIN UA: NEGATIVE
Glucose, UA: NEGATIVE
Ketones, UA: NEGATIVE
NITRITE UA: NEGATIVE
Protein, UA: NEGATIVE
SPEC GRAV UA: 1.01
Urobilinogen, UA: NEGATIVE
pH, UA: 5

## 2015-04-02 NOTE — Progress Notes (Signed)
Subjective:  Patient ID: Dorothy Pitts, female    DOB: 10/19/1941  Age: 74 y.o. MRN: [id number]  CC: GI upset and Urinary Tract Infection   HPI Dorothy Pitts presents for Temperature taken twice last night was 99.7 and later 100.4. Onset was yesterday. Moderate abdominal discomfort in the periumbilical region and hypogastric region. She has had some nausea without any vomiting. There has been no diarrhea. No flank pain.  History Dorothy Pitts has a past medical history of Hyperlipidemia; Hypertension; IBS (irritable bowel syndrome); GERD (gastroesophageal reflux disease); Cataract; Hemorrhoids; Diverticulosis of colon (without mention of hemorrhage); Acute diverticulitis (10/07/2013); UTI (lower urinary tract infection) (10/07/2013); PONV (postoperative nausea and vomiting); Osteopenia; Osteoporosis; Vitamin D deficiency; and Bowel obstruction.   She has past surgical history that includes Appendectomy; Partial hysterectomy; Bilateral oophorectomy; Hemorrhoid surgery; Breast lumpectomy (Left); Tonsillectomy; Cataract extraction; Colon resection (N/A, 12/01/2013); Eye surgery; and Abdominal hysterectomy.   Dorothy Pitts family history includes Heart attack (age of onset: 72) in Dorothy Pitts mother; Heart disease (age of onset: 32) in Dorothy Pitts brother; Hypertension in Dorothy Pitts mother; Obesity in Dorothy Pitts brother; Parkinson's disease in Dorothy Pitts father. There is no history of Colon cancer.She reports that she quit smoking about 12 years ago. Dorothy Pitts smoking use included Cigarettes. She has a 35 pack-year smoking history. She has never used smokeless tobacco. She reports that she drinks about 3.0 oz of alcohol per week. She reports that she does not use illicit drugs.  Current Outpatient Prescriptions on File Prior to Visit  Medication Sig Dispense Refill  . Ascorbic Acid (VITAMIN C) 1000 MG tablet Take 1,000 mg by mouth daily.     . Cholecalciferol (VITAMIN D3) 2000 UNITS TABS Take 1 tablet by mouth daily.    . enalapril (VASOTEC) 5 MG  tablet TAKE 1 TABLET BY MOUTH EVERY DAY AS DIRECTED 90 tablet 3  . fluticasone (FLONASE) 50 MCG/ACT nasal spray Place 2 sprays into both nostrils daily. 16 g 6  . hyoscyamine (LEVSIN, ANASPAZ) 0.125 MG tablet Take 1 tablet (0.125 mg total) by mouth every 4 (four) hours as needed. 30 tablet 2  . Multiple Vitamin (MULTIVITAMIN) tablet Take 1 tablet by mouth daily.    . mupirocin ointment (BACTROBAN) 2 % Apply 1 application topically 2 (two) times daily. 22 g 0  . ondansetron (ZOFRAN) 4 MG tablet Take 1 tablet (4 mg total) by mouth every 8 (eight) hours as needed for nausea or vomiting. 20 tablet 0  . OVER THE COUNTER MEDICATION Take 1 capsule by mouth 3 (three) times daily. Digestive health cap. - ALIGN    . OVER THE COUNTER MEDICATION Digest Basic - digestive enzymes    . raloxifene (EVISTA) 60 MG tablet TAKE 1 TABLET BY MOUTH ONCE A DAY 90 tablet 3  . spironolactone (ALDACTONE) 25 MG tablet TAKE 2 TABLETS BY MOUTH EVERY MORNING 180 tablet 3  . zolpidem (AMBIEN) 5 MG tablet TAKE 1 TABLET AT BEDTIME 30 tablet 1   No current facility-administered medications on file prior to visit.    ROS Review of Systems  Constitutional: Negative for fever, chills and diaphoresis.  HENT: Negative for congestion.   Eyes: Negative for visual disturbance.  Respiratory: Negative for cough and shortness of breath.   Cardiovascular: Negative for chest pain and palpitations.  Gastrointestinal: Negative for nausea, diarrhea and constipation.  Genitourinary: Positive for dysuria, urgency and frequency. Negative for hematuria, flank pain, decreased urine volume, menstrual problem and pelvic pain.  Musculoskeletal: Negative for joint swelling and arthralgias.  Skin: Negative  for rash.  Neurological: Negative for dizziness and numbness.    Objective:  BP 93/55 mmHg  Pulse 87  Temp(Src) 98.4 F (36.9 C) (Oral)  Ht 5' 2.5" (1.588 m)  Wt 119 lb 12.8 oz (54.341 kg)  BMI 21.55 kg/m2  Physical Exam    Constitutional: She is oriented to person, place, and time. She appears well-developed and well-nourished. No distress.  HENT:  Head: Normocephalic and atraumatic.  Right Ear: External ear normal.  Left Ear: External ear normal.  Nose: Nose normal.  Mouth/Throat: Oropharynx is clear and moist.  Eyes: Conjunctivae and EOM are normal. Pupils are equal, round, and reactive to light.  Neck: Normal range of motion. Neck supple. No thyromegaly present.  Cardiovascular: Normal rate, regular rhythm and normal heart sounds.   No murmur heard. Pulmonary/Chest: Effort normal and breath sounds normal. No respiratory distress. She has no wheezes. She has no rales.  Abdominal: Soft. Bowel sounds are normal. She exhibits no distension. There is no tenderness.  Lymphadenopathy:    She has no cervical adenopathy.  Neurological: She is alert and oriented to person, place, and time. She has normal reflexes.  Skin: Skin is warm and dry.  Psychiatric: She has a normal mood and affect. Dorothy Pitts behavior is normal. Judgment and thought content normal.    Assessment & Plan:   Dorothy Pitts was seeBosie Pitts today for gi upset and urinary tract infection.  Diagnoses and all orders for this visit:  Dysuria Orders: -     POCT UA - Microscopic Only -     POCT urinalysis dipstick -     Urine culture   I have discontinued Dorothy Pitts fluconazole, sulfamethoxazole-trimethoprim, amoxicillin-clavulanate, and nitrofurantoin (macrocrystal-monohydrate). I am also having Dorothy Pitts maintain Dorothy Pitts multivitamin, vitamin C, Vitamin D3, OVER THE COUNTER MEDICATION, spironolactone, raloxifene, enalapril, fluticasone, OVER THE COUNTER MEDICATION, hyoscyamine, mupirocin ointment, ondansetron, and zolpidem.  No orders of the defined types were placed in this encounter.     Follow-up: Return if symptoms worsen or fail to improve.  Mechele ClaudeWarren Ashten Prats, M.D.

## 2015-04-02 NOTE — Telephone Encounter (Signed)
Appointment given for today @2 :10 with stacks.

## 2015-04-04 ENCOUNTER — Telehealth: Payer: Self-pay | Admitting: *Deleted

## 2015-04-04 LAB — URINE CULTURE: Organism ID, Bacteria: NO GROWTH

## 2015-04-04 NOTE — Telephone Encounter (Signed)
Pt notified of results Verbalizes understanding 

## 2015-04-04 NOTE — Telephone Encounter (Signed)
-----   Message from Mechele Claude, MD sent at 04/04/2015 12:55 PM EDT ----- Dorothy Pitts,    Your lab result is normal.Some minor variations that are not significant are commonly marked abnormal, but do not represent any medical problem for you.  Best regards, Mechele Claude, M.D.

## 2015-04-15 ENCOUNTER — Ambulatory Visit (INDEPENDENT_AMBULATORY_CARE_PROVIDER_SITE_OTHER): Payer: Medicare Other | Admitting: Family Medicine

## 2015-04-15 ENCOUNTER — Ambulatory Visit (INDEPENDENT_AMBULATORY_CARE_PROVIDER_SITE_OTHER): Payer: Medicare Other

## 2015-04-15 ENCOUNTER — Encounter: Payer: Self-pay | Admitting: Family Medicine

## 2015-04-15 VITALS — BP 117/56 | HR 64 | Temp 98.5°F | Ht 62.5 in | Wt 117.0 lb

## 2015-04-15 DIAGNOSIS — I10 Essential (primary) hypertension: Secondary | ICD-10-CM

## 2015-04-15 DIAGNOSIS — E559 Vitamin D deficiency, unspecified: Secondary | ICD-10-CM | POA: Diagnosis not present

## 2015-04-15 DIAGNOSIS — G4700 Insomnia, unspecified: Secondary | ICD-10-CM | POA: Diagnosis not present

## 2015-04-15 DIAGNOSIS — I700 Atherosclerosis of aorta: Secondary | ICD-10-CM | POA: Insufficient documentation

## 2015-04-15 DIAGNOSIS — K432 Incisional hernia without obstruction or gangrene: Secondary | ICD-10-CM | POA: Diagnosis not present

## 2015-04-15 DIAGNOSIS — M810 Age-related osteoporosis without current pathological fracture: Secondary | ICD-10-CM

## 2015-04-15 MED ORDER — ZOLPIDEM TARTRATE 5 MG PO TABS: 5.0000 mg | ORAL_TABLET | Freq: Every day | ORAL | Status: DC

## 2015-04-15 NOTE — Patient Instructions (Addendum)
Medicare Annual Wellness Visit  [place] and the medical providers at [facility] strive to bring you the best medical care.  In doing so we not only want to address your current medical conditions and concerns but also to detect new conditions early and prevent illness, disease and health-related problems.    Medicare offers a yearly Wellness Visit which allows our clinical staff to assess your need for preventative services including immunizations, lifestyle education, counseling to decrease risk of preventable diseases and screening for fall risk and other medical concerns.    This visit is provided free of charge (no copay) for all Medicare recipients. The clinical pharmacists at [facility] have begun to conduct these Wellness Visits which will also include a thorough review of all your medications.    As you primary medical provider recommend that you make an appointment for your Annual Wellness Visit if you have not done so already this year.  You may set up this appointment before you leave today or you may call back ([phone]) and schedule an appointment.  Please make sure when you call that you mention that you are scheduling your Annual Wellness Visit with the clinical pharmacist so that the appointment may be made for the proper length of time.     Continue current medications. Continue good therapeutic lifestyle changes which include good diet and exercise. Fall precautions discussed with patient. If an FOBT was given today- please return it to our front desk. If you are over 8 years old - you may need Prevnar 13 or the adult Pneumonia vaccine.  Flu Shots are still available at our office. If you still haven't had one please call to set up a nurse visit to get one.   After your visit with [place] today you will receive a survey in the mail or online from [employer] regarding your care with [place]. Please take a moment to  fill this out. Your feedback is very important to [place] as you can help [place] better understand your patient needs as well as improve your experience and satisfaction. WE CARE ABOUT YOU!!!   The patient should continue to drink plenty of fluids We will arrange for her to have an appointment with Dr. Ovidio Kin the surgeon in [place] for a get acquainted meeting in case she develops more problems with her abdomen or her colon. Please send a copy of her records to him if he is unable to access them on the computer

## 2015-04-15 NOTE — Progress Notes (Signed)
Subjective:    Patient ID: Dorothy Pitts, female    DOB: June 26, 1941, 74 y.o.   MRN: [id number]  HPI Pt here for follow up and management of chronic medical problems which includes hypertension. She is taking medications regularly. The patient recently completed antibiotics for urinary tract infection and she says she is doing better with this. She also has a history of allergy related problems. She is requesting a refill on her Ambien. She will get a chest x-ray today and will return for fasting lab work. She will also be given an FOBT to return. She refuses to take the flu shot or the Prevnar vaccine.       Patient Active Problem List   Diagnosis Date Noted  . Osteoporosis 11/07/2014  . Diverticulitis large intestine 12/01/2013  . Hypokalemia 10/08/2013  . Anemia 10/08/2013  . Acute diverticulitis 10/07/2013  . 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 N&V (nausea and vomiting) 10/07/2013  . Abdominal pain, acute, left lower quadrant 10/07/2013  . UTI (lower urinary tract infection) 10/07/2013  . Diarrhea 10/07/2013  . Diverticulitis 10/07/2013  . Diverticulosis of colon (without mention of hemorrhage)   . Hypertension   . IBS (irritable bowel syndrome)    Outpatient Encounter Prescriptions as of 04/15/2015  Medication Sig  . Ascorbic Acid (VITAMIN C) 1000 MG tablet Take 1,000 mg by mouth daily.   . Cholecalciferol (VITAMIN D3) 2000 UNITS TABS Take 1 tablet by mouth daily.  . enalapril (VASOTEC) 5 MG tablet TAKE 1 TABLET BY MOUTH EVERY DAY AS DIRECTED  . fluticasone (FLONASE) 50 MCG/ACT nasal spray Place 2 sprays into both nostrils daily.  . hyoscyamine (LEVSIN, ANASPAZ) 0.125 MG tablet Take 1 tablet (0.125 mg total) by mouth every 4 (four) hours as needed.  . Multiple Vitamin (MULTIVITAMIN) tablet Take 1 tablet by mouth daily.  . mupirocin ointment (BACTROBAN) 2 % Apply 1 application topically 2 (two) times daily.  . ondansetron (ZOFRAN) 4 MG tablet Take 1 tablet (4 mg  total) by mouth every 8 (eight) hours as needed for nausea or vomiting.  [name] Kitchen OVER THE COUNTER MEDICATION Take 1 capsule by mouth 3 (three) times daily. Digestive health cap. - ALIGN  . OVER THE COUNTER MEDICATION Digest Basic - digestive enzymes  . raloxifene (EVISTA) 60 MG tablet TAKE 1 TABLET BY MOUTH ONCE A DAY  . spironolactone (ALDACTONE) 25 MG tablet TAKE 2 TABLETS BY MOUTH EVERY MORNING  . zolpidem (AMBIEN) 5 MG tablet TAKE 1 TABLET AT BEDTIME   No facility-administered encounter medications on file as of 04/15/2015.     Review of Systems  Constitutional: Negative.   HENT: Negative.   Eyes: Negative.   Respiratory: Negative.   Cardiovascular: Negative.   Gastrointestinal: Negative.   Endocrine: Negative.   Genitourinary: Negative.   Musculoskeletal: Negative.   Skin: Negative.   Allergic/Immunologic: Negative.   Neurological: Negative.   Hematological: Negative.   Psychiatric/Behavioral: Negative.        Objective:   Physical Exam  Constitutional: She is oriented to person, place, and time. She appears well-developed and well-nourished. No distress.  The patient is pleasant and alert  HENT:  Head: Normocephalic and atraumatic.  Right Ear: External ear normal.  Left Ear: External ear normal.  Nose: Nose normal.  Mouth/Throat: Oropharynx is clear and moist.  Eyes: Conjunctivae and EOM are normal. Pupils are equal, round, and reactive to light. Right eye exhibits no discharge. Left eye exhibits no discharge. No scleral icterus.  Neck: Normal range of  motion. Neck supple. No thyromegaly present.  Cardiovascular: Normal rate, regular rhythm, normal heart sounds and intact distal pulses.  Exam reveals no gallop and no friction rub.   No murmur heard. At 72/m  Pulmonary/Chest: Effort normal and breath sounds normal. No respiratory distress. She has no wheezes. She has no rales. She exhibits no tenderness.  Abdominal: Soft. Bowel sounds are normal. She exhibits no mass. There  is no tenderness. There is no rebound and no guarding.  The patient has an incisional hernia and still has concerns about this and would like to visit a surgeon for follow-up of this.  Musculoskeletal: Normal range of motion. She exhibits no edema or tenderness.  Lymphadenopathy:    She has no cervical adenopathy.  Neurological: She is alert and oriented to person, place, and time. She has normal reflexes. No cranial nerve deficit.  Skin: Skin is warm and dry. No rash noted.  Psychiatric: She has a normal mood and affect. Her behavior is normal. Judgment and thought content normal.  Nursing note and vitals reviewed.   BP 117/56 mmHg  Pulse 64  Temp(Src) 98.5 F (36.9 C) (Oral)  Ht 5' 2.5" (1.588 m)  Wt 117 lb (53.071 kg)  BMI 21.05 kg/m2  [facility] reading (PRIMARY) by  Dr. Brunilda Payor x-ray-thoracic aortic atherosclerosis                                      Assessment & Plan:  1. Vitamin D deficiency -Continue current treatment pending results of lab work - POCT CBC; Future - Vit D  25 hydroxy (rtn osteoporosis monitoring); Future  2. Essential hypertension -Blood pressure is good today and patient should continue with current treatment - POCT CBC; Future - BMP8+EGFR; Future - Hepatic function panel; Future - NMR, lipoprofile; Future - DG Chest 2 View; Future  3. Osteoporosis -She should continue with her vitamin D and Evista - POCT CBC; Future  4. Insomnia -Prescription for Ambien given today  5. Incisional hernia, without obstruction or gangrene -Follow-up with general surgeon for possible future issues with this.  Meds ordered this encounter  Medications  . zolpidem (AMBIEN) 5 MG tablet    Sig: Take 1 tablet (5 mg total) by mouth at bedtime.    Dispense:  30 tablet    Refill:  2    This request is for a new prescription for a controlled substance as required by Federal/State law..     Patient Instructions                       Medicare Annual Wellness  Visit  [facility] and the medical providers at [facility] strive to bring you the best medical care.  In doing so we not only want to address your current medical conditions and concerns but also to detect new conditions early and prevent illness, disease and health-related problems.    Medicare offers a yearly Wellness Visit which allows our clinical staff to assess your need for preventative services including immunizations, lifestyle education, counseling to decrease risk of preventable diseases and screening for fall risk and other medical concerns.    This visit is provided free of charge (no copay) for all Medicare recipients. The clinical pharmacists at [facility] have begun to conduct these Wellness Visits which will also include a thorough review of all your medications.    As  you primary medical provider recommend that you make an appointment for your Annual Wellness Visit if you have not done so already this year.  You may set up this appointment before you leave today or you may call back ([phone]) and schedule an appointment.  Please make sure when you call that you mention that you are scheduling your Annual Wellness Visit with the clinical pharmacist so that the appointment may be made for the proper length of time.     Continue current medications. Continue good therapeutic lifestyle changes which include good diet and exercise. Fall precautions discussed with patient. If an FOBT was given today- please return it to our front desk. If you are over 40 years old - you may need Prevnar 14 or the adult Pneumonia vaccine.  Flu Shots are still available at our office. If you still haven't had one please call to set up a nurse visit to get one.   After your visit with [place] today you will receive a survey in the mail or online from [employer] regarding your care with [place]. Please take a moment to fill this out. Your feedback is very important  to [place] as you can help [place] better understand your patient needs as well as improve your experience and satisfaction. WE CARE ABOUT YOU!!!   The patient should continue to drink plenty of fluids We will arrange for her to have an appointment with Dr. Alphonsa Overall the surgeon in [place] for a get acquainted meeting in case she develops more problems with her abdomen or her colon. Please send a copy of her records to him if he is unable to access them on the computer   Arrie Senate MD

## 2015-04-17 ENCOUNTER — Other Ambulatory Visit (INDEPENDENT_AMBULATORY_CARE_PROVIDER_SITE_OTHER): Payer: Medicare Other

## 2015-04-17 DIAGNOSIS — M810 Age-related osteoporosis without current pathological fracture: Secondary | ICD-10-CM

## 2015-04-17 DIAGNOSIS — I10 Essential (primary) hypertension: Secondary | ICD-10-CM

## 2015-04-17 DIAGNOSIS — E559 Vitamin D deficiency, unspecified: Secondary | ICD-10-CM

## 2015-04-17 LAB — POCT CBC
GRANULOCYTE PERCENT: 45.5 % (ref 37–80)
HCT, POC: 38.3 % (ref 37.7–47.9)
HEMOGLOBIN: 12.1 g/dL — AB (ref 12.2–16.2)
LYMPH, POC: 1.9 (ref 0.6–3.4)
MCH, POC: 29.8 pg (ref 27–31.2)
MCHC: 31.6 g/dL — AB (ref 31.8–35.4)
MCV: 94.3 fL (ref 80–97)
MPV: 7.7 fL (ref 0–99.8)
POC Granulocyte: 1.9 — AB (ref 2–6.9)
POC LYMPH %: 45.3 % (ref 10–50)
Platelet Count, POC: 281 10*3/uL (ref 142–424)
RBC: 4.06 M/uL (ref 4.04–5.48)
RDW, POC: 12.8 %
WBC: 4.1 10*3/uL — AB (ref 4.6–10.2)

## 2015-04-17 NOTE — Progress Notes (Signed)
Lab only 

## 2015-04-18 ENCOUNTER — Telehealth: Payer: Self-pay | Admitting: *Deleted

## 2015-04-18 ENCOUNTER — Telehealth: Payer: Self-pay | Admitting: Family Medicine

## 2015-04-18 LAB — HEPATIC FUNCTION PANEL
ALK PHOS: 40 IU/L (ref 39–117)
ALT: 10 IU/L (ref 0–32)
AST: 9 IU/L (ref 0–40)
Albumin: 3.8 g/dL (ref 3.5–4.8)
BILIRUBIN, DIRECT: 0.07 mg/dL (ref 0.00–0.40)
Bilirubin Total: 0.4 mg/dL (ref 0.0–1.2)
TOTAL PROTEIN: 6.1 g/dL (ref 6.0–8.5)

## 2015-04-18 LAB — NMR, LIPOPROFILE
Cholesterol: 218 mg/dL — ABNORMAL HIGH (ref 100–199)
HDL CHOLESTEROL BY NMR: 64 mg/dL (ref >39)
HDL Particle Number: 39 umol/L (ref >=30.5)
LDL PARTICLE NUMBER: 1889 nmol/L — AB (ref <1000)
LDL Size: 20.8 nm (ref >20.5)
LDL-C: 139 mg/dL — AB (ref 0–99)
LP-IR SCORE: 42 (ref <=45)
SMALL LDL PARTICLE NUMBER: 533 nmol/L — AB (ref <=527)
TRIGLYCERIDES BY NMR: 73 mg/dL (ref 0–149)

## 2015-04-18 LAB — BMP8+EGFR
BUN / CREAT RATIO: 22 (ref 11–26)
BUN: 16 mg/dL (ref 8–27)
CO2: 28 mmol/L (ref 18–29)
Calcium: 9.4 mg/dL (ref 8.7–10.3)
Chloride: 100 mmol/L (ref 97–108)
Creatinine, Ser: 0.72 mg/dL (ref 0.57–1.00)
GFR calc non Af Amer: 83 mL/min/{1.73_m2} (ref >59)
GFR, EST AFRICAN AMERICAN: 96 mL/min/{1.73_m2} (ref >59)
Glucose: 106 mg/dL — ABNORMAL HIGH (ref 65–99)
Potassium: 4.6 mmol/L (ref 3.5–5.2)
Sodium: 141 mmol/L (ref 134–144)

## 2015-04-18 LAB — VITAMIN D 25 HYDROXY (VIT D DEFICIENCY, FRACTURES): Vit D, 25-Hydroxy: 44 ng/mL (ref 30.0–100.0)

## 2015-04-18 NOTE — Telephone Encounter (Signed)
-----   Message from Ernestina Penna, MD sent at 04/18/2015  1:50 PM EDT ----- The blood sugar slightly elevated at 106. The creatinine, the most important kidney function test is within normal limits. The electrolytes including potassium are good. All liver function tests are within normal limits Cholesterol numbers with advanced lipid testing are much more elevated than they were 4 months ago. The total LDL particle numbers now 1889 and previously it was 1570. The LDL C is also increased at 139 and the should be less than 100. The triglycerides are good.----Please see if the patient would be willing to try a low-dose statin drug, i.e. Crestor 5 mg------ if it bothers her she can stop it.------ if she is willing to try this and take this please call Crestor in 5 mg 1 daily at bedtime and recheck liver function tests in 4 weeks----she should continue with aggressive therapeutic lifestyle changes. The vitamin D level is good and within normal limits at 44.0 and she should continue with current treatment.

## 2015-05-16 ENCOUNTER — Encounter: Payer: Self-pay | Admitting: *Deleted

## 2015-06-24 ENCOUNTER — Ambulatory Visit (INDEPENDENT_AMBULATORY_CARE_PROVIDER_SITE_OTHER): Payer: Medicare Other

## 2015-06-24 ENCOUNTER — Encounter: Payer: Self-pay | Admitting: Family Medicine

## 2015-06-24 ENCOUNTER — Ambulatory Visit (INDEPENDENT_AMBULATORY_CARE_PROVIDER_SITE_OTHER): Payer: Medicare Other | Admitting: Family Medicine

## 2015-06-24 VITALS — BP 125/66 | HR 74 | Temp 97.5°F | Ht 62.5 in | Wt 120.2 lb

## 2015-06-24 DIAGNOSIS — W19XXXA Unspecified fall, initial encounter: Secondary | ICD-10-CM | POA: Insufficient documentation

## 2015-06-24 DIAGNOSIS — M7989 Other specified soft tissue disorders: Secondary | ICD-10-CM

## 2015-06-24 DIAGNOSIS — M79641 Pain in right hand: Secondary | ICD-10-CM | POA: Diagnosis not present

## 2015-06-24 DIAGNOSIS — Y92009 Unspecified place in unspecified non-institutional (private) residence as the place of occurrence of the external cause: Secondary | ICD-10-CM

## 2015-06-24 NOTE — Progress Notes (Signed)
BP 125/66 mmHg  Pulse 74  Temp(Src) 97.5 F (36.4 C) (Oral)  Ht 5' 2.5" (1.588 m)  Wt 120 lb 3.2 oz (54.522 kg)  BMI 21.62 kg/m2   Subjective:    Patient ID: Dorothy Pitts, female    DOB: 28-Nov-1940, 74 y.o.   MRN: [id number]  HPI: Dorothy Pitts is a 74 y.o. female presenting on 06/24/2015 for right hand pain and swelling   HPI Fall and her right hand Patient was at home in her bathroom was squatting down for something and lost her balance and fell backwards onto her hand and caught the inside of her drawer with her hand that she was falling backwards. This occurred 2 days ago, and initially she had significant swelling and pain and difficulty moving her right hand especially in the thumb and first few fingers at the MCP joints. The swelling and pain have reduced her still present and she wants to double check that there is no fracture. She has been using Tylenol because she cannot use anti-inflammatories because of an allergy for swelling.  Relevant past medical, surgical, family and social history reviewed and updated as indicated. Interim medical history since our last visit reviewed. Allergies and medications reviewed and updated.  Review of Systems  Constitutional: Negative for fever and chills.  HENT: Negative for congestion, ear discharge and ear pain.   Eyes: Negative for redness and visual disturbance.  Respiratory: Negative for chest tightness and shortness of breath.   Cardiovascular: Negative for chest pain and leg swelling.  Genitourinary: Negative for dysuria and difficulty urinating.  Musculoskeletal: Positive for myalgias and joint swelling (and stiffness). Negative for back pain and gait problem.  Skin: Positive for color change (bruising) and wound (small cuton the inside of hand on the first finger). Negative for rash.  Neurological: Negative for light-headedness and headaches.  Psychiatric/Behavioral: Negative for behavioral problems and agitation.  All  other systems reviewed and are negative.   Per HPI unless specifically indicated above     Medication List       This list is accurate as of: 06/24/15  3:51 PM.  Always use your most recent med list.               enalapril 5 MG tablet  Commonly known as:  VASOTEC  TAKE 1 TABLET BY MOUTH EVERY DAY AS DIRECTED     fluticasone 50 MCG/ACT nasal spray  Commonly known as:  FLONASE  Place 2 sprays into both nostrils daily.     multivitamin tablet  Take 1 tablet by mouth daily.     raloxifene 60 MG tablet  Commonly known as:  EVISTA  TAKE 1 TABLET BY MOUTH ONCE A DAY     spironolactone 25 MG tablet  Commonly known as:  ALDACTONE  TAKE 2 TABLETS BY MOUTH EVERY MORNING     vitamin C 1000 MG tablet  Take 1,000 mg by mouth daily.     Vitamin D3 2000 UNITS Tabs  Take 1 tablet by mouth daily.     zolpidem 5 MG tablet  Commonly known as:  AMBIEN  Take 1 tablet (5 mg total) by mouth at bedtime.           Objective:    BP 125/66 mmHg  Pulse 74  Temp(Src) 97.5 F (36.4 C) (Oral)  Ht 5' 2.5" (1.588 m)  Wt 120 lb 3.2 oz (54.522 kg)  BMI 21.62 kg/m2  Wt Readings from Last 3 Encounters:  06/24/15 120  lb 3.2 oz (54.522 kg)  04/15/15 117 lb (53.071 kg)  04/02/15 119 lb 12.8 oz (54.341 kg)    Physical Exam  Constitutional: She is oriented to person, place, and time. She appears well-developed and well-nourished. No distress.  Eyes: Conjunctivae and EOM are normal. Pupils are equal, round, and reactive to light.  Cardiovascular: Normal rate and regular rhythm.   No murmur heard. Pulmonary/Chest: Effort normal and breath sounds normal. No respiratory distress. She has no wheezes.  Musculoskeletal: Normal range of motion. She exhibits no edema or tenderness.  Neurological: She is alert and oriented to person, place, and time. Coordination normal.  Skin: Skin is warm and dry. No rash noted. She is not diaphoretic.     Psychiatric: She has a normal mood and affect. Her  behavior is normal.  Vitals reviewed.  Hand x-ray: Preliminary read of hand x-ray shows soft tissue swelling but no noted fractures. We will await final read by radiologist  Results for orders placed or performed in visit on 04/17/15  [facility]  Result Value Ref Range   Glucose 106 (H) 65 - 99 mg/dL   BUN 16 8 - 27 mg/dL   Creatinine, Ser 0.72 0.57 - 1.00 mg/dL   GFR calc non Af Amer 83 >59 mL/min/1.73   GFR calc Af Amer 96 >59 mL/min/1.73   BUN/Creatinine Ratio 22 11 - 26   Sodium 141 134 - 144 mmol/L   Potassium 4.6 3.5 - 5.2 mmol/L   Chloride 100 97 - 108 mmol/L   [name] 28 18 - 29 mmol/L   Calcium 9.4 8.7 - 10.3 mg/dL  Hepatic function panel  Result Value Ref Range   Total Protein 6.1 6.0 - 8.5 g/dL   Albumin 3.8 3.5 - 4.8 g/dL   Bilirubin Total 0.4 0.0 - 1.2 mg/dL   Bilirubin, Direct 0.07 0.00 - 0.40 mg/dL   Alkaline Phosphatase 40 39 - 117 IU/L   AST 9 0 - 40 IU/L   ALT 10 0 - 32 IU/L  NMR, lipoprofile  Result Value Ref Range   LDL Particle Number 1889 (H) <1000 nmol/L   LDL-C 139 (H) 0 - 99 mg/dL   HDL Cholesterol by NMR 64 >39 mg/dL   Triglycerides by NMR 73 0 - 149 mg/dL   Cholesterol 218 (H) 100 - 199 mg/dL   HDL Particle Number 39.0 >=30.5 umol/L   Small LDL Particle Number 533 (H) <=527 nmol/L   LDL Size 20.8 >20.5 nm   LP-IR Score 42 <=45  Vit D  25 hydroxy (rtn osteoporosis monitoring)  Result Value Ref Range   Vit D, 25-Hydroxy 44.0 30.0 - 100.0 ng/mL  POCT CBC  Result Value Ref Range   WBC 4.1 (A) 4.6 - 10.2 K/uL   Lymph, poc 1.9 0.6 - 3.4   POC LYMPH PERCENT 45.3 10 - 50 %L   POC Granulocyte 1.9 (A) 2 - 6.9   Granulocyte percent 45.5 37 - 80 %G   RBC 4.06 4.04 - 5.48 M/uL   Hemoglobin 12.1 (A) 12.2 - 16.2 g/dL   HCT, POC 38.3 37.7 - 47.9 %   MCV 94.3 80 - 97 fL   MCH, POC 29.8 27 - 31.2 pg   MCHC 31.6 (A) 31.8 - 35.4 g/dL   RDW, POC 12.8 %   Platelet Count, POC 281 142 - 424 K/uL   MPV 7.7 0 - 99.8 fL      Assessment & Plan:   Problem List  Items Addressed This Visit  Other   Fall at home    Lost balance when was trying to squat down at home and fell backwards on her right hand smashing it in a drawer. X-rays appear normal of the hand today. Recommended NSAIDs but patient has an allergy to them, she will take Tylenol and do ice packs to help with the swelling. Pain is Re: Improved from yesterday, event happened 2 days ago       Other Visit Diagnoses    Swelling of right hand    -  Primary    Relevant Orders    DG Hand Complete Right        Follow up plan: Return if symptoms worsen or fail to improve.  Caryl Pina, MD [facility] 06/24/2015, 3:51 PM

## 2015-06-24 NOTE — Assessment & Plan Note (Signed)
Lost balance when was trying to squat down at home and fell backwards on her right hand smashing it in a drawer. X-rays appear normal of the hand today. Recommended NSAIDs but patient has an allergy to them, she will take Tylenol and do ice packs to help with the swelling. Pain is Re: Improved from yesterday, event happened 2 days ago

## 2015-07-04 ENCOUNTER — Other Ambulatory Visit: Payer: Self-pay | Admitting: Surgery

## 2015-07-04 DIAGNOSIS — K439 Ventral hernia without obstruction or gangrene: Secondary | ICD-10-CM

## 2015-07-08 ENCOUNTER — Inpatient Hospital Stay: Admission: RE | Admit: 2015-07-08 | Payer: Medicare Other | Source: Ambulatory Visit

## 2015-07-18 ENCOUNTER — Ambulatory Visit
Admission: RE | Admit: 2015-07-18 | Discharge: 2015-07-18 | Disposition: A | Discharge status: Home / Self Care | Payer: Medicare Other | Source: Ambulatory Visit | Attending: Surgery | Admitting: Surgery

## 2015-08-09 ENCOUNTER — Other Ambulatory Visit: Payer: Self-pay | Admitting: Family Medicine

## 2015-08-11 ENCOUNTER — Other Ambulatory Visit: Payer: Self-pay | Admitting: Family Medicine

## 2015-08-16 LAB — HM MAMMOGRAPHY: HM MAMMO: NEGATIVE

## 2015-08-28 ENCOUNTER — Encounter: Payer: Self-pay | Admitting: Family Medicine

## 2015-08-28 ENCOUNTER — Ambulatory Visit (INDEPENDENT_AMBULATORY_CARE_PROVIDER_SITE_OTHER): Payer: Medicare Other | Admitting: Family Medicine

## 2015-08-28 VITALS — BP 137/69 | HR 70 | Temp 98.4°F | Ht 62.5 in | Wt 122.0 lb

## 2015-08-28 DIAGNOSIS — I700 Atherosclerosis of aorta: Secondary | ICD-10-CM

## 2015-08-28 DIAGNOSIS — E785 Hyperlipidemia, unspecified: Secondary | ICD-10-CM | POA: Insufficient documentation

## 2015-08-28 DIAGNOSIS — I10 Essential (primary) hypertension: Secondary | ICD-10-CM | POA: Diagnosis not present

## 2015-08-28 DIAGNOSIS — K432 Incisional hernia without obstruction or gangrene: Secondary | ICD-10-CM | POA: Diagnosis not present

## 2015-08-28 DIAGNOSIS — M810 Age-related osteoporosis without current pathological fracture: Secondary | ICD-10-CM | POA: Diagnosis not present

## 2015-08-28 DIAGNOSIS — E559 Vitamin D deficiency, unspecified: Secondary | ICD-10-CM

## 2015-08-28 NOTE — Progress Notes (Signed)
Subjective:    Patient ID: Dorothy Pitts, female    DOB: Nov 13, 1940, 74 y.o.   MRN: [id number]  HPI Pt here for follow up and management of chronic medical problems which includes hypertension. She is taking medications regularly. The patient is doing well today with no specific problems. She has had her mammogram and pelvic exam done recently in October by Dr. Stann Mainland. She refuses to take a flu shot or Prevnar vaccine. She is due to get lab work and she will return fasting for this. She is also due for a fecal occult blood test card.      Patient Active Problem List   Diagnosis Date Noted  . Fall at home 06/24/2015  . Thoracic aortic atherosclerosis ([place]) 04/15/2015  . Osteoporosis 11/07/2014  . Diverticulitis large intestine 12/01/2013  . Hypokalemia 10/08/2013  . Anemia 10/08/2013  . Acute diverticulitis 10/07/2013  . 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 N&V (nausea and vomiting) 10/07/2013  . Abdominal pain, acute, left lower quadrant 10/07/2013  . UTI (lower urinary tract infection) 10/07/2013  . Diarrhea 10/07/2013  . Diverticulitis 10/07/2013  . Diverticulosis of colon (without mention of hemorrhage)   . Hypertension   . IBS (irritable bowel syndrome)    Outpatient Encounter Prescriptions as of 08/28/2015  Medication Sig  . Ascorbic Acid (VITAMIN C) 1000 MG tablet Take 1,000 mg by mouth daily.   . Cholecalciferol (VITAMIN D3) 2000 UNITS TABS Take 1 tablet by mouth daily.  . enalapril (VASOTEC) 5 MG tablet TAKE 1 TABLET BY MOUTH EVERY DAY AS DIRECTED  . fluticasone (FLONASE) 50 MCG/ACT nasal spray Place 2 sprays into both nostrils daily.  . Multiple Vitamin (MULTIVITAMIN) tablet Take 1 tablet by mouth daily.  . raloxifene (EVISTA) 60 MG tablet TAKE 1 TABLET BY MOUTH ONCE A DAY  . spironolactone (ALDACTONE) 25 MG tablet TAKE 2 TABLETS BY MOUTH EVERY MORNING  . zolpidem (AMBIEN) 5 MG tablet Take 1 tablet (5 mg total) by mouth at bedtime.   No facility-administered  encounter medications on file as of 08/28/2015.     Review of Systems  Constitutional: Negative.   HENT: Negative.   Eyes: Negative.   Respiratory: Negative.   Cardiovascular: Negative.   Gastrointestinal: Negative.   Endocrine: Negative.   Genitourinary: Negative.   Musculoskeletal: Negative.   Skin: Negative.   Allergic/Immunologic: Negative.   Neurological: Negative.   Hematological: Negative.   Psychiatric/Behavioral: Negative.        Objective:   Physical Exam  Constitutional: She is oriented to person, place, and time. She appears well-developed and well-nourished. No distress.  Pleasant alert and in good spirits  HENT:  Head: Normocephalic and atraumatic.  Right Ear: External ear normal.  Left Ear: External ear normal.  Nose: Nose normal.  Mouth/Throat: Oropharynx is clear and moist.  Eyes: Conjunctivae and EOM are normal. Pupils are equal, round, and reactive to light. Right eye exhibits no discharge. Left eye exhibits no discharge. No scleral icterus.  Neck: Normal range of motion. Neck supple. No thyromegaly present.  No carotid bruits thyromegaly or anterior cervical adenopathy  Cardiovascular: Normal rate, regular rhythm, normal heart sounds and intact distal pulses.   No murmur heard. The heart is regular at 60/m  Pulmonary/Chest: Effort normal and breath sounds normal. No respiratory distress. She has no wheezes. She has no rales. She exhibits no tenderness.  Clear anteriorly and posteriorly  Abdominal: Soft. Bowel sounds are normal. She exhibits no mass. There is no tenderness. There is no  rebound and no guarding.  The patient has a somewhat protuberant abdomen secondary to the abdominal surgeries that she has and she will continue to follow-up with the general surgeon for possible repair of this incisional hernia in the future.  Musculoskeletal: Normal range of motion. She exhibits no edema or tenderness.  Lymphadenopathy:    She has no cervical adenopathy.    Neurological: She is alert and oriented to person, place, and time. She has normal reflexes. No cranial nerve deficit.  Skin: Skin is warm and dry. No rash noted.  Psychiatric: She has a normal mood and affect. Her behavior is normal. Judgment and thought content normal.  Nursing note and vitals reviewed.  BP 137/69 mmHg  Pulse 70  Temp(Src) 98.4 F (36.9 C) (Oral)  Ht 5' 2.5" (1.588 m)  Wt 122 lb (55.339 kg)  BMI 21.94 kg/m2        Assessment & Plan:  1. Essential hypertension -Continue with current treatment and sodium restriction - BMP8+EGFR; Future - CBC with Differential/Platelet; Future - Hepatic function panel; Future - NMR, lipoprofile; Future  2. Vitamin D deficiency -Continue with vitamin D replacement pending results of lab work - CBC with Differential/Platelet; Future - Vit D  25 hydroxy (rtn osteoporosis monitoring); Future  3. Thoracic aortic atherosclerosis ([place]) -Continue with aggressive therapeutic lifestyle changes and cholesterol management - CBC with Differential/Platelet; Future - NMR, lipoprofile; Future  4. Osteoporosis -Continue with calcium and vitamin D replacement - CBC with Differential/Platelet; Future  5. Hyperlipidemia -We will reassess the need for lipid control with medication she obviously should continue with her diet. We will address this when the lab work is returned.  6. Incisional hernia, without obstruction or gangrene -Continue follow-up with surgeon.  Patient Instructions                       Medicare Annual Wellness Visit  [facility] and the medical providers at [facility] strive to bring you the best medical care.  In doing so we not only want to address your current medical conditions and concerns but also to detect new conditions early and prevent illness, disease and health-related problems.    Medicare offers a yearly Wellness Visit which allows our clinical staff to assess your need for  preventative services including immunizations, lifestyle education, counseling to decrease risk of preventable diseases and screening for fall risk and other medical concerns.    This visit is provided free of charge (no copay) for all Medicare recipients. The clinical pharmacists at [facility] have begun to conduct these Wellness Visits which will also include a thorough review of all your medications.    As you primary medical provider recommend that you make an appointment for your Annual Wellness Visit if you have not done so already this year.  You may set up this appointment before you leave today or you may call back ([phone]) and schedule an appointment.  Please make sure when you call that you mention that you are scheduling your Annual Wellness Visit with the clinical pharmacist so that the appointment may be made for the proper length of time.     Continue current medications. Continue good therapeutic lifestyle changes which include good diet and exercise. Fall precautions discussed with patient. If an FOBT was given today- please return it to our front desk. If you are over 54 years old - you may need Prevnar 40 or the adult Pneumonia vaccine.  **Flu  shots are available--- please call and schedule a FLU-CLINIC appointment**  After your visit with [place] today you will receive a survey in the mail or online from [employer] regarding your care with [place]. Please take a moment to fill this out. Your feedback is very important to [place] as you can help [place] better understand your patient needs as well as improve your experience and satisfaction. WE CARE ABOUT YOU!!!   The patient should continue to follow-up with the general surgeon, her gynecologist, and her gastroenterologist as per their plans. She should use soaps or fabric softeners and detergents that are scent free She should continue to drink plenty of fluids and stay well hydrated She should avoid fried foods and  acidic foods that might irritate her stomach and continue her Zantac as she is doing She should also continue with her stool softener to keep her bowels moving regularly She should continue with aggressive therapeutic lifestyle changes to keep her cholesterol down and her weight down.   Arrie Senate MD

## 2015-08-28 NOTE — Patient Instructions (Addendum)
Medicare Annual Wellness Visit  [employer] and the medical providers at [facility] strive to bring you the best medical care.  In doing so we not only want to address your current medical conditions and concerns but also to detect new conditions early and prevent illness, disease and health-related problems.    Medicare offers a yearly Wellness Visit which allows our clinical staff to assess your need for preventative services including immunizations, lifestyle education, counseling to decrease risk of preventable diseases and screening for fall risk and other medical concerns.    This visit is provided free of charge (no copay) for all Medicare recipients. The clinical pharmacists at [facility] have begun to conduct these Wellness Visits which will also include a thorough review of all your medications.    As you primary medical provider recommend that you make an appointment for your Annual Wellness Visit if you have not done so already this year.  You may set up this appointment before you leave today or you may call back [phone] and schedule an appointment.  Please make sure when you call that you mention that you are scheduling your Annual Wellness Visit with the clinical pharmacist so that the appointment may be made for the proper length of time.     Continue current medications. Continue good therapeutic lifestyle changes which include good diet and exercise. Fall precautions discussed with patient. If an FOBT was given today- please return it to our front desk. If you are over 74 years old - you may need Prevnar 13 or the adult Pneumonia vaccine.  **Flu shots are available--- please call and schedule a FLU-CLINIC appointment**  After your visit with us today you will receive a survey in the mail or online from [employer] regarding your care with us. Please take a moment to fill this out. Your feedback is very  important to us as you can help us better understand your patient needs as well as improve your experience and satisfaction. WE CARE ABOUT YOU!!!   The patient should continue to follow-up with the general surgeon, her gynecologist, and her gastroenterologist as per their plans. She should use soaps or fabric softeners and detergents that are scent free She should continue to drink plenty of fluids and stay well hydrated She should avoid fried foods and acidic foods that might irritate her stomach and continue her Zantac as she is doing She should also continue with her stool softener to keep her bowels moving regularly She should continue with aggressive therapeutic lifestyle changes to keep her cholesterol down and her weight down.

## 2015-08-29 ENCOUNTER — Ambulatory Visit: Payer: Medicare Other | Admitting: Family Medicine

## 2015-09-07 ENCOUNTER — Other Ambulatory Visit: Payer: Self-pay | Admitting: Family Medicine

## 2015-10-15 ENCOUNTER — Other Ambulatory Visit: Payer: Self-pay | Admitting: Family Medicine

## 2015-10-15 NOTE — Telephone Encounter (Signed)
Last filled 09/17/15, last seen 08/28/15. Call in at [facility]

## 2015-10-19 ENCOUNTER — Other Ambulatory Visit: Payer: Self-pay | Admitting: Family Medicine

## 2015-10-22 ENCOUNTER — Encounter: Payer: Self-pay | Admitting: Gastroenterology

## 2015-11-06 ENCOUNTER — Other Ambulatory Visit: Payer: Self-pay | Admitting: Family Medicine

## 2015-11-21 ENCOUNTER — Ambulatory Visit (INDEPENDENT_AMBULATORY_CARE_PROVIDER_SITE_OTHER): Payer: Medicare Other | Admitting: Pharmacist

## 2015-11-21 ENCOUNTER — Encounter: Payer: Self-pay | Admitting: Pharmacist

## 2015-11-21 VITALS — BP 140/72 | HR 88 | Ht 61.0 in | Wt 123.5 lb

## 2015-11-21 DIAGNOSIS — Z0000 Encounter for general adult medical examination without abnormal findings: Secondary | ICD-10-CM

## 2015-11-21 DIAGNOSIS — I10 Essential (primary) hypertension: Secondary | ICD-10-CM

## 2015-11-21 DIAGNOSIS — E785 Hyperlipidemia, unspecified: Secondary | ICD-10-CM

## 2015-11-21 NOTE — Patient Instructions (Addendum)
Dorothy Pitts , Thank you for taking time to come for your Medicare Wellness Visit. I appreciate your ongoing commitment to your health goals. Please review the following plan we discussed and let me know if I can assist you in the future.   These are the goals we discussed: Continue with current physical activity - goal is 150 minutes each week . Continue with current diet to maintain healthy weight   This is a list of the screening recommended for you and due dates:  Health Maintenance  Topic Date Due  . Pneumonia vaccines (1 of 2 - PCV13) 06/23/2016 - declined due to past reaction  . Flu Shot  08/27/2017 - declined due to past reaction  . Tetanus Vaccine  04/19/2016  . Mammogram  07/31/2016  . DEXA scan (bone density measurement)  11/07/2016  . Colon Cancer Screening  09/09/2022  . Shingles Vaccine  Completed  *Topic was postponed. The date shown is not the original due date.   Health Maintenance, Female Adopting a healthy lifestyle and getting preventive care can go a long way to promote health and wellness. Talk with your health care provider about what schedule of regular examinations is right for you. This is a good chance for you to check in with your provider about disease prevention and staying healthy. In between checkups, there are plenty of things you can do on your own. Experts have done a lot of research about which lifestyle changes and preventive measures are most likely to keep you healthy. Ask your health care provider for more information. WEIGHT AND DIET  Eat a healthy diet  Be sure to include plenty of vegetables, fruits, low-fat dairy products, and lean protein.  Do not eat a lot of foods high in solid fats, added sugars, or salt.  Get regular exercise. This is one of the most important things you can do for your health.  Most adults should exercise for at least 150 minutes each week. The exercise should increase your heart rate and make you sweat  (moderate-intensity exercise).  Most adults should also do strengthening exercises at least twice a week. This is in addition to the moderate-intensity exercise.  Maintain a healthy weight  Body mass index (BMI) is a measurement that can be used to identify possible weight problems. It estimates body fat based on height and weight. Your health care provider can help determine your BMI and help you achieve or maintain a healthy weight.  For females 13 years of age and older:   A BMI below 18.5 is considered underweight.  A BMI of 18.5 to 24.9 is normal.  A BMI of 25 to 29.9 is considered overweight.  A BMI of 30 and above is considered obese.  Watch levels of cholesterol and blood lipids  You should start having your blood tested for lipids and cholesterol at 75 years of age, then have this test every 5 years.  You may need to have your cholesterol levels checked more often if:  Your lipid or cholesterol levels are high.  You are older than 75 years of age.  You are at high risk for heart disease.  CANCER SCREENING   Lung Cancer  Lung cancer screening is recommended for adults 76-48 years old who are at high risk for lung cancer because of a history of smoking.  A yearly low-dose CT scan of the lungs is recommended for people who:  Currently smoke.  Have quit within the past 15 years.  Have at  least a 30-pack-year history of smoking. A pack year is smoking an average of one pack of cigarettes a day for 1 year.  Yearly screening should continue until it has been 15 years since you quit.  Yearly screening should stop if you develop a health problem that would prevent you from having lung cancer treatment.  Breast Cancer  Practice breast self-awareness. This means understanding how your breasts normally appear and feel.  It also means doing regular breast self-exams. Let your health care provider know about any changes, no matter how small.  If you are in your 20s  or 30s, you should have a clinical breast exam (CBE) by a health care provider every 1-3 years as part of a regular health exam.  If you are 34 or older, have a CBE every year. Also consider having a breast X-ray (mammogram) every year.  If you have a family history of breast cancer, talk to your health care provider about genetic screening.  If you are at high risk for breast cancer, talk to your health care provider about having an MRI and a mammogram every year.  Breast cancer gene (BRCA) assessment is recommended for women who have family members with BRCA-related cancers. BRCA-related cancers include:  Breast.  Ovarian.  Tubal.  Peritoneal cancers.  Results of the assessment will determine the need for genetic counseling and BRCA1 and BRCA2 testing. Cervical Cancer Your health care provider may recommend that you be screened regularly for cancer of the pelvic organs (ovaries, uterus, and vagina). This screening involves a pelvic examination, including checking for microscopic changes to the surface of your cervix (Pap test). You may be encouraged to have this screening done every 3 years, beginning at age 8.  For women ages 71-65, health care providers may recommend pelvic exams and Pap testing every 3 years, or they may recommend the Pap and pelvic exam, combined with testing for human papilloma virus (HPV), every 5 years. Some types of HPV increase your risk of cervical cancer. Testing for HPV may also be done on women of any age with unclear Pap test results.  Other health care providers may not recommend any screening for nonpregnant women who are considered low risk for pelvic cancer and who do not have symptoms. Ask your health care provider if a screening pelvic exam is right for you.  If you have had past treatment for cervical cancer or a condition that could lead to cancer, you need Pap tests and screening for cancer for at least 20 years after your treatment. If Pap tests  have been discontinued, your risk factors (such as having a new sexual partner) need to be reassessed to determine if screening should resume. Some women have medical problems that increase the chance of getting cervical cancer. In these cases, your health care provider may recommend more frequent screening and Pap tests. Colorectal Cancer  This type of cancer can be detected and often prevented.  Routine colorectal cancer screening usually begins at 75 years of age and continues through 75 years of age.  Your health care provider may recommend screening at an earlier age if you have risk factors for colon cancer.  Your health care provider may also recommend using home test kits to check for hidden blood in the stool.  A small camera at the end of a tube can be used to examine your colon directly (sigmoidoscopy or colonoscopy). This is done to check for the earliest forms of colorectal cancer.  Routine screening  usually begins at age 28.  Direct examination of the colon should be repeated every 5-10 years through 75 years of age. However, you may need to be screened more often if early forms of precancerous polyps or small growths are found. Skin Cancer  Check your skin from head to toe regularly.  Tell your health care provider about any new moles or changes in moles, especially if there is a change in a mole's shape or color.  Also tell your health care provider if you have a mole that is larger than the size of a pencil eraser.  Always use sunscreen. Apply sunscreen liberally and repeatedly throughout the day.  Protect yourself by wearing long sleeves, pants, a wide-brimmed hat, and sunglasses whenever you are outside. HEART DISEASE, DIABETES, AND HIGH BLOOD PRESSURE   High blood pressure causes heart disease and increases the risk of stroke. High blood pressure is more likely to develop in:  People who have blood pressure in the high end of the normal range (130-139/85-89 mm  Hg).  People who are overweight or obese.  People who are African American.  If you are 6-39 years of age, have your blood pressure checked every 3-5 years. If you are 4 years of age or older, have your blood pressure checked every year. You should have your blood pressure measured twice--once when you are at a hospital or clinic, and once when you are not at a hospital or clinic. Record the average of the two measurements. To check your blood pressure when you are not at a hospital or clinic, you can use:  An automated blood pressure machine at a pharmacy.  A home blood pressure monitor.  If you are between 51 years and 45 years old, ask your health care provider if you should take aspirin to prevent strokes.  Have regular diabetes screenings. This involves taking a blood sample to check your fasting blood sugar level.  If you are at a normal weight and have a low risk for diabetes, have this test once every three years after 75 years of age.  If you are overweight and have a high risk for diabetes, consider being tested at a younger age or more often. PREVENTING INFECTION  Hepatitis B  If you have a higher risk for hepatitis B, you should be screened for this virus. You are considered at high risk for hepatitis B if:  You were born in a country where hepatitis B is common. Ask your health care provider which countries are considered high risk.  Your parents were born in a high-risk country, and you have not been immunized against hepatitis B (hepatitis B vaccine).  You have HIV or AIDS.  You use needles to inject street drugs.  You live with someone who has hepatitis B.  You have had sex with someone who has hepatitis B.  You get hemodialysis treatment.  You take certain medicines for conditions, including cancer, organ transplantation, and autoimmune conditions. Hepatitis C  Blood testing is recommended for:  Everyone born from [birth year] through 1965.  Anyone with known  risk factors for hepatitis C. Sexually transmitted infections (STIs)  You should be screened for sexually transmitted infections (STIs) including gonorrhea and chlamydia if:  You are sexually active and are younger than 75 years of age.  You are older than 75 years of age and your health care provider tells you that you are at risk for this type of infection.  Your sexual activity has changed since you  were last screened and you are at an increased risk for chlamydia or gonorrhea. Ask your health care provider if you are at risk.  If you do not have HIV, but are at risk, it may be recommended that you take a prescription medicine daily to prevent HIV infection. This is called pre-exposure prophylaxis (PrEP). You are considered at risk if:  You are sexually active and do not regularly use condoms or know the HIV status of your partner(s).  You take drugs by injection.  You are sexually active with a partner who has HIV. Talk with your health care provider about whether you are at high risk of being infected with HIV. If you choose to begin PrEP, you should first be tested for HIV. You should then be tested every 3 months for as long as you are taking PrEP.  PREGNANCY   If you are premenopausal and you may become pregnant, ask your health care provider about preconception counseling.  If you may become pregnant, take 400 to 800 micrograms (mcg) of folic acid every day.  If you want to prevent pregnancy, talk to your health care provider about birth control (contraception). OSTEOPOROSIS AND MENOPAUSE   Osteoporosis is a disease in which the bones lose minerals and strength with aging. This can result in serious bone fractures. Your risk for osteoporosis can be identified using a bone density scan.  If you are 54 years of age or older, or if you are at risk for osteoporosis and fractures, ask your health care provider if you should be screened.  Ask your health care provider whether you  should take a calcium or vitamin D supplement to lower your risk for osteoporosis.  Menopause may have certain physical symptoms and risks.  Hormone replacement therapy may reduce some of these symptoms and risks. Talk to your health care provider about whether hormone replacement therapy is right for you.  HOME CARE INSTRUCTIONS   Schedule regular health, dental, and eye exams.  Stay current with your immunizations.   Do not use any tobacco products including cigarettes, chewing tobacco, or electronic cigarettes.  If you are pregnant, do not drink alcohol.  If you are breastfeeding, limit how much and how often you drink alcohol.  Limit alcohol intake to no more than 1 drink per day for nonpregnant women. One drink equals 12 ounces of beer, 5 ounces of wine, or 1 ounces of hard liquor.  Do not use street drugs.  Do not share needles.  Ask your health care provider for help if you need support or information about quitting drugs.  Tell your health care provider if you often feel depressed.  Tell your health care provider if you have ever been abused or do not feel safe at home.   This information is not intended to replace advice given to you by your health care provider. Make sure you discuss any questions you have with your health care provider.   Document Released: 04/27/2011 Document Revised: 11/02/2014 Document Reviewed: 09/13/2013 Elsevier Interactive Patient Education [employer].

## 2015-11-21 NOTE — Progress Notes (Signed)
Patient ID: Dorothy Pitts, female   DOB: 10-19-1941, 75 y.o.   MRN: [id number]    Subjective:   Dorothy Pitts is a 75 y.o. widowed, white, female who presents for a subsequent Medicare Annual Wellness Visit. Dorothy Pitts lives in [place], [place] and her adult son lives with her.  Review of Systems  Review of Systems  Constitutional: Negative.   HENT: Positive for hearing loss (wears hearing aids).   Eyes: Negative.   Respiratory: Negative.   Cardiovascular: Negative.   Gastrointestinal: Negative.   Genitourinary: Negative.   Musculoskeletal: Negative.   Skin: Negative.   Neurological: Negative.   Endo/Heme/Allergies: Negative.   Psychiatric/Behavioral: The patient has insomnia (from time to time - takes zolpidem as needed).     Current Medications (verified) Outpatient Encounter Prescriptions as of 11/21/2015  Medication Sig  . Cholecalciferol (VITAMIN D3) 2000 UNITS TABS Take 1 tablet by mouth daily.  . Multiple Vitamin (MULTIVITAMIN) tablet Take 1 tablet by mouth daily.  . raloxifene (EVISTA) 60 MG tablet TAKE 1 TABLET BY MOUTH ONCE A DAY  . spironolactone (ALDACTONE) 25 MG tablet TAKE 2 TABLETS BY MOUTH EVERY MORNING (Patient taking differently: TAKE 1 TABLETS BY MOUTH EVERY MORNING)  . zolpidem (AMBIEN) 5 MG tablet TAKE 1 TABLET AT BEDTIME  . Ascorbic Acid (VITAMIN C) 1000 MG tablet Take 1,000 mg by mouth daily. Reported on 11/21/2015  . enalapril (VASOTEC) 5 MG tablet TAKE 1 TABLET BY MOUTH EVERY DAY AS DIRECTED  . fluticasone (FLONASE) 50 MCG/ACT nasal spray Place 2 sprays into both nostrils daily. (Patient not taking: Reported on 11/21/2015)  . [DISCONTINUED] raloxifene (EVISTA) 60 MG tablet TAKE 1 TABLET BY MOUTH ONCE A DAY   No facility-administered encounter medications on file as of 11/21/2015.    Allergies (verified) Actonel; Fosamax; Lovaza; Nsaids; Cephalexin; and Ciprofloxacin   History: Past Medical History  Diagnosis Date  . Hyperlipidemia   .  Hypertension   . IBS (irritable bowel syndrome)   . GERD (gastroesophageal reflux disease)   . Cataract   . Hemorrhoids   . Diverticulosis of colon (without mention of hemorrhage)   . Acute diverticulitis 10/07/2013    With microperforation.  [name] Kitchen UTI (lower urinary tract infection) 10/07/2013  . PONV (postoperative nausea and vomiting)   . Osteopenia   . Osteoporosis   . Vitamin D deficiency   . Bowel obstruction ([place])   . HOH (hard of hearing)    Past Surgical History  Procedure Laterality Date  . Appendectomy    . Partial hysterectomy    . Bilateral oophorectomy    . Hemorrhoid surgery    . Breast lumpectomy Left   . Tonsillectomy    . Cataract extraction    . Colon resection N/A 12/01/2013    Procedure: HAND ASSISTED LAPAROSCOPIC PARTIAL COLECTOMY;  Surgeon: Jamesetta So, MD;  Location: [facility];  Service: General;  Laterality: N/A;  . Eye surgery    . Abdominal hysterectomy     Family History  Problem Relation Age of Onset  . Colon cancer Neg Hx   . Hypertension Mother   . Heart attack Mother 35  . Parkinson's disease Father   . Heart disease Brother 50    bypass  . Obesity Brother    Social History   Occupational History  . retired    Social History Main Topics  . Smoking status: Former Smoker -- 1.00 packs/day for 35 years    Types: Cigarettes    Quit date: 10/26/2002  .  Smokeless tobacco: Never Used  . Alcohol Use: 3.0 oz/week    5 Glasses of wine per week     Comment: wine  . Drug Use: No  . Sexual Activity: No    Do you feel safe at home?  Yes  Dietary issues and exercise activities: Current Exercise Habits:: Home exercise routine, Type of exercise: walking;Other - see comments (and golfs at least weekly), Time (Minutes): 30, Frequency (Times/Week): 5, Weekly Exercise (Minutes/Week): 150, Intensity: Moderate  Current Dietary habits:  Patient limits fried and high fat foods due to history of elevated LDL.   Objective:    Today's Vitals   11/21/15  0928  BP: 140/72  Pulse: 88  Height: 5' 1"  (1.549 m)  Weight: 123 lb 8 oz (56.019 kg)  PainSc: 0-No pain   Body mass index is 23.35 kg/(m^2).  Activities of Daily Living In your present state of health, do you have any difficulty performing the following activities: 11/21/2015  Hearing? Y  Vision? N  Difficulty concentrating or making decisions? N  Walking or climbing stairs? N  Dressing or bathing? N  Doing errands, shopping? N  Preparing Food and eating ? N  Using the Toilet? N  In the past six months, have you accidently leaked urine? N  Do you have problems with loss of bowel control? Y  Managing your Medications? N  Managing your Finances? N  Housekeeping or managing your Housekeeping? N    Are there smokers in your home (other than you)? No   Cardiac Risk Factors include: advanced age (>74mn, >>65women);hypertension  Depression Screen PHQ 2/9 Scores 11/21/2015 08/28/2015 06/24/2015 04/15/2015  PHQ - 2 Score 0 0 0 0    Fall Risk Fall Risk  11/21/2015 08/28/2015 06/24/2015 04/15/2015 04/15/2015  Falls in the past year? No No No No No  Number falls in past yr: - - - - -  Injury with Fall? - - - - -    Cognitive Function: MMSE - Mini Mental State Exam 11/21/2015  Orientation to time 5  Orientation to Place 5  Registration 3  Attention/ Calculation 5  Recall 3  Language- name 2 objects 2  Language- repeat 1  Language- follow 3 step command 3  Language- read & follow direction 1  Write a sentence 1  Copy design 1  Total score 30    Immunizations and Health Maintenance Immunization History  Administered Date(s) Administered  . Td 04/19/2006  . Zoster 12/24/2009   There are no preventive care reminders to display for this patient.  Patient Care Team: DChipper Herb MD as PCP - General (Family Medicine)  Indicate any recent Medical Services you may have received from other than [facility] providers in the past year (date may be approximate).    Assessment:      Annual Wellness Visit    Screening Tests Health Maintenance  Topic Date Due  . PNA vac Low Risk Adult (1 of 2 - PCV13) 06/23/2016 (Originally 07/09/2006)  . INFLUENZA VACCINE  08/27/2017 (Originally 05/27/2015)  . TETANUS/TDAP  04/19/2016  . MAMMOGRAM  07/31/2016  . DEXA SCAN  11/07/2016  . COLONOSCOPY  09/09/2022  . ZOSTAVAX  Completed        Plan:   During the course of the visit JSheriannwas educated and counseled about the following appropriate screening and preventive services:   Vaccines to include Pneumoccal, Influenza, Hepatitis B, Td, Zostavax - oatient declined influenza and pneumonia vaccines today due to history of adverse reactions  to vaccines in past  Colorectal cancer screening - colonoscopy is UTD; patient has seen GI in last 6 months  Cardiovascular disease screening - last EKG 2014.  Patients BP was slightly elevated today but she did not take BP med since she was fasting for labs.  Diabetes screening - checking FBG today  Bone Denisty / Osteoporosis Screening - UTD  Mammogram - UTD but needed to request copy of 2016 mammogram - paperwork sent to Dr Stann Mainland to get confirmation of mammogram  PAP - not required / hysterectomy  Glaucoma screening / Eye Exam - UTD  Nutrition counseling - continue with current diet  Advanced Directives- information discussed and packet given  Physical Activity - continue to walk and golf.  Goal is 150 minutes per week  Orders Placed This Encounter  Procedures  . CBC with Differential/Platelet  . CMP14+EGFR  . NMR, lipoprofile    Patient Instructions (the written plan) were given to the patient.    Medicare Attestation I have personally reviewed: The patient's medical and social history Their use of alcohol, tobacco or illicit drugs Their current medications and supplements The patient's functional ability including ADLs,fall risks, home safety risks, cognitive, and hearing and visual impairment Diet and physical  activities Evidence for depression or mood disorders  The patient's weight, height, BMI, and HR/BP have been recorded in the chart. I have made referrals, counseling, and provided education to the patient based on review of the above and I have provided the patient with a written personalized care plan for preventive services.    Cherre Robins, [facility]   11/21/2015

## 2015-11-22 LAB — CBC WITH DIFFERENTIAL/PLATELET
BASOS: 1 %
Basophils Absolute: 0 10*3/uL (ref 0.0–0.2)
EOS (ABSOLUTE): 0.2 10*3/uL (ref 0.0–0.4)
EOS: 4 %
HEMATOCRIT: 39 % (ref 34.0–46.6)
HEMOGLOBIN: 13.2 g/dL (ref 11.1–15.9)
IMMATURE GRANS (ABS): 0 10*3/uL (ref 0.0–0.1)
IMMATURE GRANULOCYTES: 0 %
Lymphocytes Absolute: 1.5 10*3/uL (ref 0.7–3.1)
Lymphs: 38 %
MCH: 31.4 pg (ref 26.6–33.0)
MCHC: 33.8 g/dL (ref 31.5–35.7)
MCV: 93 fL (ref 79–97)
MONOCYTES: 7 %
MONOS ABS: 0.3 10*3/uL (ref 0.1–0.9)
NEUTROS PCT: 50 %
Neutrophils Absolute: 2 10*3/uL (ref 1.4–7.0)
Platelets: 264 10*3/uL (ref 150–379)
RBC: 4.2 x10E6/uL (ref 3.77–5.28)
RDW: 12.8 % (ref 12.3–15.4)
WBC: 3.9 10*3/uL (ref 3.4–10.8)

## 2015-11-22 LAB — NMR, LIPOPROFILE
CHOLESTEROL: 230 mg/dL — AB (ref 100–199)
HDL Cholesterol by NMR: 72 mg/dL (ref >39)
HDL PARTICLE NUMBER: 45.4 umol/L (ref >=30.5)
LDL Particle Number: 2102 nmol/L — ABNORMAL HIGH (ref <1000)
LDL Size: 21.1 nm (ref >20.5)
LDL-C: 148 mg/dL — AB (ref 0–99)
SMALL LDL PARTICLE NUMBER: 713 nmol/L — AB (ref <=527)
Triglycerides by NMR: 48 mg/dL (ref 0–149)

## 2015-11-22 LAB — CMP14+EGFR
ALBUMIN: 4.2 g/dL (ref 3.5–4.8)
ALT: 11 IU/L (ref 0–32)
AST: 14 IU/L (ref 0–40)
Albumin/Globulin Ratio: 1.9 (ref 1.1–2.5)
Alkaline Phosphatase: 46 IU/L (ref 39–117)
BUN/Creatinine Ratio: 22 (ref 11–26)
BUN: 14 mg/dL (ref 8–27)
Bilirubin Total: <0.2 mg/dL (ref 0.0–1.2)
CALCIUM: 9.1 mg/dL (ref 8.7–10.3)
CO2: 26 mmol/L (ref 18–29)
CREATININE: 0.65 mg/dL (ref 0.57–1.00)
Chloride: 100 mmol/L (ref 96–106)
GFR, EST AFRICAN AMERICAN: 101 mL/min/{1.73_m2} (ref >59)
GFR, EST NON AFRICAN AMERICAN: 88 mL/min/{1.73_m2} (ref >59)
GLOBULIN, TOTAL: 2.2 g/dL (ref 1.5–4.5)
Glucose: 106 mg/dL — ABNORMAL HIGH (ref 65–99)
POTASSIUM: 4.7 mmol/L (ref 3.5–5.2)
SODIUM: 143 mmol/L (ref 134–144)
TOTAL PROTEIN: 6.4 g/dL (ref 6.0–8.5)

## 2015-11-26 LAB — SPECIMEN STATUS REPORT

## 2015-11-26 LAB — HGB A1C W/O EAG: Hgb A1c MFr Bld: 5.8 % — ABNORMAL HIGH (ref 4.8–5.6)

## 2015-11-28 ENCOUNTER — Other Ambulatory Visit: Payer: Self-pay | Admitting: Pharmacist

## 2015-11-28 ENCOUNTER — Encounter: Payer: Self-pay | Admitting: Pharmacist

## 2015-11-28 DIAGNOSIS — R7303 Prediabetes: Secondary | ICD-10-CM | POA: Insufficient documentation

## 2015-11-28 MED ORDER — ROSUVASTATIN CALCIUM 10 MG PO TABS: 10.0000 mg | ORAL_TABLET | Freq: Every day | ORAL | Status: DC

## 2015-12-10 ENCOUNTER — Telehealth: Payer: Self-pay | Admitting: Family Medicine

## 2015-12-11 NOTE — Telephone Encounter (Signed)
Patient asked is she can still have an occasional glass of wine when taking crestor.  Her last LFTs were WNL therefore I instructed her that this should be OK.

## 2016-01-02 ENCOUNTER — Encounter (INDEPENDENT_AMBULATORY_CARE_PROVIDER_SITE_OTHER): Payer: Self-pay

## 2016-01-02 ENCOUNTER — Ambulatory Visit (INDEPENDENT_AMBULATORY_CARE_PROVIDER_SITE_OTHER): Payer: Medicare Other | Admitting: Family Medicine

## 2016-01-02 ENCOUNTER — Encounter: Payer: Self-pay | Admitting: Family Medicine

## 2016-01-02 VITALS — BP 124/69 | HR 78 | Temp 97.8°F | Ht 61.0 in | Wt 122.0 lb

## 2016-01-02 DIAGNOSIS — I700 Atherosclerosis of aorta: Secondary | ICD-10-CM

## 2016-01-02 DIAGNOSIS — I10 Essential (primary) hypertension: Secondary | ICD-10-CM | POA: Diagnosis not present

## 2016-01-02 DIAGNOSIS — K589 Irritable bowel syndrome without diarrhea: Secondary | ICD-10-CM

## 2016-01-02 DIAGNOSIS — E785 Hyperlipidemia, unspecified: Secondary | ICD-10-CM | POA: Diagnosis not present

## 2016-01-02 DIAGNOSIS — E559 Vitamin D deficiency, unspecified: Secondary | ICD-10-CM | POA: Diagnosis not present

## 2016-01-02 DIAGNOSIS — Z1211 Encounter for screening for malignant neoplasm of colon: Secondary | ICD-10-CM

## 2016-01-02 MED ORDER — ZOLPIDEM TARTRATE 5 MG PO TABS: 5.0000 mg | ORAL_TABLET | Freq: Every day | ORAL | Status: DC

## 2016-01-02 MED ORDER — HYOSCYAMINE SULFATE 0.125 MG PO TABS: 0.1250 mg | ORAL_TABLET | ORAL | Status: DC | PRN

## 2016-01-02 NOTE — Patient Instructions (Addendum)
Medicare Annual Wellness Visit  [place] and the medical providers at [facility] strive to bring you the best medical care.  In doing so we not only want to address your current medical conditions and concerns but also to detect new conditions early and prevent illness, disease and health-related problems.    Medicare offers a yearly Wellness Visit which allows our clinical staff to assess your need for preventative services including immunizations, lifestyle education, counseling to decrease risk of preventable diseases and screening for fall risk and other medical concerns.    This visit is provided free of charge (no copay) for all Medicare recipients. The clinical pharmacists at [facility] have begun to conduct these Wellness Visits which will also include a thorough review of all your medications.    As you primary medical provider recommend that you make an appointment for your Annual Wellness Visit if you have not done so already this year.  You may set up this appointment before you leave today or you may call back [phone] and schedule an appointment.  Please make sure when you call that you mention that you are scheduling your Annual Wellness Visit with the clinical pharmacist so that the appointment may be made for the proper length of time.    Continue current medications. Continue good therapeutic lifestyle changes which include good diet and exercise. Fall precautions discussed with patient. If an FOBT was given today- please return it to our front desk. If you are over 75 years old - you may need Prevnar 13 or the adult Pneumonia vaccine.  **Flu shots are available--- please call and schedule a FLU-CLINIC appointment**  After your visit with us today you will receive a survey in the mail or online from [employer] regarding your care with us. Please take a moment to fill this out. Your feedback is very  important to us as you can help us better understand your patient needs as well as improve your experience and satisfaction. WE CARE ABOUT YOU!!!   Continue to follow-up with general surgeon and gastroenterology Stay active physically Use nasal saline, Mucinex as needed and cool mist humidification and continue with Flonase 1 spray each nostril at bedtime Continue with Crestor and aggressive therapeutic lifestyle changes

## 2016-01-02 NOTE — Progress Notes (Signed)
Subjective:    Patient ID: Dorothy Pitts, female    DOB: 01-06-1941, 75 y.o.   MRN: [id number]  HPI Pt here for follow up and management of chronic medical problems which includes hypertension and hyperlipidemia. She is taking medications regularly. The patient has had a flare of her IBS. She sees a gynecologist in [place] for her Pap smear and mammogram. She will be given an FOBT to return today. She is requesting a refill on Ambien. The patient continues to be followed by the general surgeon because of her abdominal weakness from her multiple surgeries and is considering having repair of this this coming summer. She is also been seen by the gastroenterologist and he will be following her irritable bowel syndrome symptoms. She denies any chest pain or shortness of breath. She is not having any trouble swallowing heartburn indigestion nausea vomiting. She does have some diarrhea recently because of her flare in her irritable bowel syndrome. She is passing her water without problems. She is having a little bit more nasal congestion and allergy problem recently.      Patient Active Problem List   Diagnosis Date Noted  . Pre-diabetes 11/28/2015  . Hyperlipidemia 08/28/2015  . Fall at home 06/24/2015  . Thoracic aortic atherosclerosis (HCC) 04/15/2015  . Osteoporosis 11/07/2014  . Diverticulitis large intestine 12/01/2013  . Hypokalemia 10/08/2013  . Anemia 10/08/2013  . Acute diverticulitis 10/07/2013  . 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 N&V (nausea and vomiting) 10/07/2013  . Abdominal pain, acute, left lower quadrant 10/07/2013  . UTI (lower urinary tract infection) 10/07/2013  . Diarrhea 10/07/2013  . Diverticulitis 10/07/2013  . Diverticulosis of colon (without mention of hemorrhage)   . Hypertension   . IBS (irritable bowel syndrome)    Outpatient Encounter Prescriptions as of 01/02/2016  Medication Sig  . Ascorbic Acid (VITAMIN C) 1000 MG tablet Take 1,000 mg by  mouth daily. Reported on 11/21/2015  . Cholecalciferol (VITAMIN D3) 2000 UNITS TABS Take 1 tablet by mouth daily.  . enalapril (VASOTEC) 5 MG tablet TAKE 1 TABLET BY MOUTH EVERY DAY AS DIRECTED  . fluticasone (FLONASE) 50 MCG/ACT nasal spray Place 2 sprays into both nostrils daily.  . Multiple Vitamin (MULTIVITAMIN) tablet Take 1 tablet by mouth daily.  . raloxifene (EVISTA) 60 MG tablet TAKE 1 TABLET BY MOUTH ONCE A DAY  . rosuvastatin (CRESTOR) 10 MG tablet Take 1 tablet (10 mg total) by mouth daily.  [name] Kitchen spironolactone (ALDACTONE) 25 MG tablet TAKE 2 TABLETS BY MOUTH EVERY MORNING (Patient taking differently: TAKE 1 TABLETS BY MOUTH EVERY MORNING)  . zolpidem (AMBIEN) 5 MG tablet TAKE 1 TABLET AT BEDTIME   No facility-administered encounter medications on file as of 01/02/2016.     Review of Systems  Constitutional: Negative.   HENT: Negative.   Eyes: Negative.   Respiratory: Negative.   Cardiovascular: Negative.   Gastrointestinal: Negative.        Recent IBS flare  Endocrine: Negative.   Genitourinary: Negative.   Musculoskeletal: Negative.   Skin: Negative.   Allergic/Immunologic: Negative.   Neurological: Negative.   Hematological: Negative.   Psychiatric/Behavioral: Negative.        Objective:   Physical Exam  Constitutional: She is oriented to person, place, and time. She appears well-developed and well-nourished. No distress.  HENT:  Head: Normocephalic and atraumatic.  Right Ear: External ear normal.  Left Ear: External ear normal.  Mouth/Throat: Oropharynx is clear and moist. No oropharyngeal exudate.  Nasal congestion bilaterally  Eyes: Conjunctivae and EOM are normal. Pupils are equal, round, and reactive to light. Right eye exhibits no discharge. Left eye exhibits no discharge. No scleral icterus.  Neck: Normal range of motion. Neck supple. No thyromegaly present.  No bruits thyromegaly or anterior cervical adenopathy  Cardiovascular: Normal rate, regular rhythm,  normal heart sounds and intact distal pulses.   No murmur heard. Pulmonary/Chest: Effort normal and breath sounds normal. No respiratory distress. She has no wheezes. She has no rales.  Abdominal: Soft. Bowel sounds are normal. She exhibits distension. She exhibits no mass. There is no tenderness. There is no rebound and no guarding.  Abdominal muscle weakness midline from multiple abdominal surgeries for diverticulitis  Musculoskeletal: Normal range of motion. She exhibits no edema or tenderness.  Lymphadenopathy:    She has no cervical adenopathy.  Neurological: She is alert and oriented to person, place, and time. She has normal reflexes. No cranial nerve deficit.  Skin: Skin is warm and dry. No rash noted.  Psychiatric: She has a normal mood and affect. Her behavior is normal. Judgment and thought content normal.  Nursing note and vitals reviewed.  BP 124/69 mmHg  Pulse 78  Temp(Src) 97.8 F (36.6 C) (Oral)  Ht 5\' 1"  (1.549 m)  Wt 122 lb (55.339 kg)  BMI 23.06 kg/m2        Assessment & Plan:  1. Essential hypertension -The blood pressure is good today and she will continue with current treatment  2. Hyperlipidemia -Continue with Crestor  3. Vitamin D deficiency -Continue with vitamin D replacement  4. Thoracic aortic atherosclerosis (HCC) -Continue with Crestor and aggressive therapeutic lifestyle changes  5. Special screening for malignant neoplasms, colon -Continue to follow up with gastroenterology - Fecal occult blood, imunochemical; Future  6. IBS (irritable bowel syndrome) -Continue hyoscyamine as needed and this will be refilled. If problems get worse she will return to see the gastroenterologist  Meds ordered this encounter  Medications  . hyoscyamine (LEVSIN, ANASPAZ) 0.125 MG tablet    Sig: Take 1 tablet (0.125 mg total) by mouth every 4 (four) hours as needed.    Dispense:  30 tablet    Refill:  1  . zolpidem (AMBIEN) 5 MG tablet    Sig: Take 1  tablet (5 mg total) by mouth at bedtime.    Dispense:  30 tablet    Refill:  2    Not to exceed 3 additional fills before 10/28/2015.   Patient Instructions                       Medicare Annual Wellness Visit  [place] and the medical providers at [facility] strive to bring you the best medical care.  In doing so we not only want to address your current medical conditions and concerns but also to detect new conditions early and prevent illness, disease and health-related problems.    Medicare offers a yearly Wellness Visit which allows our clinical staff to assess your need for preventative services including immunizations, lifestyle education, counseling to decrease risk of preventable diseases and screening for fall risk and other medical concerns.    This visit is provided free of charge (no copay) for all Medicare recipients. The clinical pharmacists at [facility] have begun to conduct these Wellness Visits which will also include a thorough review of all your medications.    As you primary medical provider recommend that you make an appointment for your Annual  Wellness Visit if you have not done so already this year.  You may set up this appointment before you leave today or you may call back [phone]) and schedule an appointment.  Please make sure when you call that you mention that you are scheduling your Annual Wellness Visit with the clinical pharmacist so that the appointment may be made for the proper length of time.    Continue current medications. Continue good therapeutic lifestyle changes which include good diet and exercise. Fall precautions discussed with patient. If an FOBT was given today- please return it to our front desk. If you are over 67 years old - you may need Prevnar 13 or the adult Pneumonia vaccine.  **Flu shots are available--- please call and schedule a FLU-CLINIC appointment**  After your visit with [place] today  you will receive a survey in the mail or online from [employer] regarding your care with [place]. Please take a moment to fill this out. Your feedback is very important to [place] as you can help [place] better understand your patient needs as well as improve your experience and satisfaction. WE CARE ABOUT YOU!!!   Continue to follow-up with general surgeon and gastroenterology Stay active physically Use nasal saline, Mucinex as needed and cool mist humidification and continue with Flonase 1 spray each nostril at bedtime Continue with Crestor and aggressive therapeutic lifestyle changes   Nyra Capes MD

## 2016-01-16 ENCOUNTER — Encounter: Payer: Self-pay | Admitting: *Deleted

## 2016-01-25 ENCOUNTER — Other Ambulatory Visit: Payer: Self-pay | Admitting: Pharmacist

## 2016-02-04 ENCOUNTER — Other Ambulatory Visit: Payer: Self-pay | Admitting: Family Medicine

## 2016-02-20 ENCOUNTER — Other Ambulatory Visit: Payer: Self-pay | Admitting: Family Medicine

## 2016-02-20 NOTE — Telephone Encounter (Signed)
Last filled 01/02/2016 

## 2016-02-21 NOTE — Telephone Encounter (Signed)
Aware ,script ready. 

## 2016-02-22 ENCOUNTER — Other Ambulatory Visit: Payer: Self-pay | Admitting: Family Medicine

## 2016-03-04 ENCOUNTER — Other Ambulatory Visit: Payer: Self-pay | Admitting: Family Medicine

## 2016-03-05 ENCOUNTER — Other Ambulatory Visit: Payer: Self-pay | Admitting: Family Medicine

## 2016-03-05 NOTE — Telephone Encounter (Signed)
Patient notified that meds sent to pharmacy 

## 2016-03-12 ENCOUNTER — Other Ambulatory Visit: Payer: Medicare Other

## 2016-03-18 ENCOUNTER — Other Ambulatory Visit: Payer: Medicare Other

## 2016-03-18 DIAGNOSIS — E785 Hyperlipidemia, unspecified: Secondary | ICD-10-CM

## 2016-03-18 DIAGNOSIS — I700 Atherosclerosis of aorta: Secondary | ICD-10-CM

## 2016-03-18 DIAGNOSIS — I10 Essential (primary) hypertension: Secondary | ICD-10-CM

## 2016-03-18 DIAGNOSIS — E559 Vitamin D deficiency, unspecified: Secondary | ICD-10-CM

## 2016-03-19 LAB — CBC WITH DIFFERENTIAL/PLATELET
BASOS ABS: 0 10*3/uL (ref 0.0–0.2)
Basos: 1 %
EOS (ABSOLUTE): 0.2 10*3/uL (ref 0.0–0.4)
Eos: 3 %
Hematocrit: 38.6 % (ref 34.0–46.6)
Hemoglobin: 12.5 g/dL (ref 11.1–15.9)
IMMATURE GRANS (ABS): 0 10*3/uL (ref 0.0–0.1)
Immature Granulocytes: 0 %
LYMPHS: 37 %
Lymphocytes Absolute: 1.7 10*3/uL (ref 0.7–3.1)
MCH: 30.3 pg (ref 26.6–33.0)
MCHC: 32.4 g/dL (ref 31.5–35.7)
MCV: 94 fL (ref 79–97)
MONOCYTES: 8 %
Monocytes Absolute: 0.4 10*3/uL (ref 0.1–0.9)
NEUTROS ABS: 2.2 10*3/uL (ref 1.4–7.0)
Neutrophils: 51 %
Platelets: 259 10*3/uL (ref 150–379)
RBC: 4.13 x10E6/uL (ref 3.77–5.28)
RDW: 13.3 % (ref 12.3–15.4)
WBC: 4.4 10*3/uL (ref 3.4–10.8)

## 2016-03-19 LAB — NMR, LIPOPROFILE
CHOLESTEROL: 181 mg/dL (ref 100–199)
HDL Cholesterol by NMR: 75 mg/dL (ref >39)
HDL PARTICLE NUMBER: 49.1 umol/L (ref >=30.5)
LDL Particle Number: 1346 nmol/L — ABNORMAL HIGH (ref <1000)
LDL SIZE: 20.5 nm (ref >20.5)
LDL-C: 86 mg/dL (ref 0–99)
LP-IR SCORE: 44 (ref <=45)
Small LDL Particle Number: 753 nmol/L — ABNORMAL HIGH (ref <=527)
TRIGLYCERIDES BY NMR: 100 mg/dL (ref 0–149)

## 2016-03-19 LAB — HEPATIC FUNCTION PANEL
ALBUMIN: 4.2 g/dL (ref 3.5–4.8)
ALK PHOS: 46 IU/L (ref 39–117)
ALT: 16 IU/L (ref 0–32)
AST: 15 IU/L (ref 0–40)
BILIRUBIN, DIRECT: 0.11 mg/dL (ref 0.00–0.40)
Bilirubin Total: 0.4 mg/dL (ref 0.0–1.2)
TOTAL PROTEIN: 6.1 g/dL (ref 6.0–8.5)

## 2016-03-19 LAB — BMP8+EGFR
BUN/Creatinine Ratio: 29 — ABNORMAL HIGH (ref 12–28)
BUN: 18 mg/dL (ref 8–27)
CHLORIDE: 100 mmol/L (ref 96–106)
CO2: 23 mmol/L (ref 18–29)
CREATININE: 0.63 mg/dL (ref 0.57–1.00)
Calcium: 9.2 mg/dL (ref 8.7–10.3)
GFR calc Af Amer: 102 mL/min/{1.73_m2} (ref >59)
GFR calc non Af Amer: 89 mL/min/{1.73_m2} (ref >59)
GLUCOSE: 107 mg/dL — AB (ref 65–99)
Potassium: 4.1 mmol/L (ref 3.5–5.2)
Sodium: 140 mmol/L (ref 134–144)

## 2016-03-19 LAB — VITAMIN D 25 HYDROXY (VIT D DEFICIENCY, FRACTURES): Vit D, 25-Hydroxy: 32.2 ng/mL (ref 30.0–100.0)

## 2016-04-09 ENCOUNTER — Telehealth: Payer: Self-pay | Admitting: Family Medicine

## 2016-04-09 NOTE — Telephone Encounter (Signed)
Started Crestor in Feb or march.  Current dose is 10mg  qd.  She states that last week she started to have lots of leg and muscle pain.  She stopped Crestor yesterday and pain is better today.   I recommended that she remain off crestor for 3 more days.  On Monday, June 19th I recommended that she restart crestor at 10mg  1/2 tablet daily.  She is to stop and notify office if pain returns.

## 2016-04-10 ENCOUNTER — Telehealth: Payer: Self-pay | Admitting: Family Medicine

## 2016-04-10 NOTE — Telephone Encounter (Signed)
Spoke with patient. Complains of some dysuria and scant urination this afternoon. Advised to push fluids and take Tylenol or ibuprofen for discomfort. Appt scheduled for tomorrow morning at 8:15.

## 2016-04-11 ENCOUNTER — Encounter: Payer: Self-pay | Admitting: Family Medicine

## 2016-04-11 ENCOUNTER — Ambulatory Visit: Payer: Medicare Other | Admitting: Family Medicine

## 2016-04-11 ENCOUNTER — Ambulatory Visit (INDEPENDENT_AMBULATORY_CARE_PROVIDER_SITE_OTHER): Payer: Medicare Other | Admitting: Family Medicine

## 2016-04-11 VITALS — BP 126/65 | HR 74 | Temp 97.5°F | Ht 61.0 in | Wt 123.0 lb

## 2016-04-11 DIAGNOSIS — R300 Dysuria: Secondary | ICD-10-CM | POA: Diagnosis not present

## 2016-04-11 MED ORDER — NITROFURANTOIN MONOHYD MACRO 100 MG PO CAPS: 100.0000 mg | ORAL_CAPSULE | Freq: Two times a day (BID) | ORAL | Status: DC

## 2016-04-11 NOTE — Progress Notes (Signed)
BP 126/65 mmHg  Pulse 74  Temp(Src) 97.5 F (36.4 C) (Oral)  Ht 5\' 1"  (1.549 m)  Wt 123 lb (55.792 kg)  BMI 23.25 kg/m2   Subjective:    Patient ID: Dorothy Pitts, female    DOB: [birth year], 75 y.o.   MRN: [id number]  HPI: Dorothy Pitts is a 75 y.o. female presenting on 04/11/2016 for Urinary Tract Infection   HPI Dysuria Patient had dysuria yesterday and then took cranberry juice and a lot of fluids and flushed it out somewhat. She denies any dysuria or burning or bowel pain or flank pain or fevers or chills today. She gets urinary tract infections commonly and has a resistant form of Escherichia coli on the last time her culture grew anything.  Relevant past medical, surgical, family and social history reviewed and updated as indicated. Interim medical history since our last visit reviewed. Allergies and medications reviewed and updated.  Review of Systems  Constitutional: Negative for fever and chills.  HENT: Negative for congestion, ear discharge and ear pain.   Eyes: Negative for redness and visual disturbance.  Respiratory: Negative for chest tightness and shortness of breath.   Cardiovascular: Negative for chest pain and leg swelling.  Gastrointestinal: Negative for abdominal pain.  Genitourinary: Positive for dysuria, urgency and frequency. Negative for hematuria, flank pain, decreased urine volume and difficulty urinating.  Musculoskeletal: Negative for back pain and gait problem.  Skin: Negative for rash.  Neurological: Negative for light-headedness and headaches.  Psychiatric/Behavioral: Negative for behavioral problems and agitation.  All other systems reviewed and are negative.   Per HPI unless specifically indicated above     Medication List       This list is accurate as of: 04/11/16  8:45 AM.  Always use your most recent med list.               enalapril 5 MG tablet  Commonly known as:  VASOTEC  TAKE 1 TABLET BY MOUTH EVERY DAY AS DIRECTED       fluticasone 50 MCG/ACT nasal spray  Commonly known as:  FLONASE  Place 2 sprays into both nostrils daily.     hyoscyamine 0.125 MG tablet  Commonly known as:  LEVSIN, ANASPAZ  Take 1 tablet (0.125 mg total) by mouth every 4 (four) hours as needed.     multivitamin tablet  Take 1 tablet by mouth daily.     nitrofurantoin (macrocrystal-monohydrate) 100 MG capsule  Commonly known as:  MACROBID  Take 1 capsule (100 mg total) by mouth 2 (two) times daily.     raloxifene 60 MG tablet  Commonly known as:  EVISTA  TAKE 1 TABLET BY MOUTH ONCE A DAY     rosuvastatin 10 MG tablet  Commonly known as:  CRESTOR  Take 5 mg by mouth daily. Reported on 04/11/2016     spironolactone 25 MG tablet  Commonly known as:  ALDACTONE  TAKE 2 TABLETS BY MOUTH EVERY MORNING     vitamin C 1000 MG tablet  Take 1,000 mg by mouth daily. Reported on 11/21/2015     Vitamin D3 2000 units Tabs  Take 1 tablet by mouth daily.     zolpidem 5 MG tablet  Commonly known as:  AMBIEN  TAKE 1 TABLET AT BEDTIME AS NEEDED           Objective:    BP 126/65 mmHg  Pulse 74  Temp(Src) 97.5 F (36.4 C) (Oral)  Ht 5\' 1"  (1.549 m)  Wt 123 lb (55.792 kg)  BMI 23.25 kg/m2  Wt Readings from Last 3 Encounters:  04/11/16 123 lb (55.792 kg)  01/02/16 122 lb (55.339 kg)  11/21/15 123 lb 8 oz (56.019 kg)    Physical Exam  Constitutional: She is oriented to person, place, and time. She appears well-developed and well-nourished. No distress.  Eyes: Conjunctivae and EOM are normal. Pupils are equal, round, and reactive to light.  Cardiovascular: Normal rate, regular rhythm, normal heart sounds and intact distal pulses.   No murmur heard. Pulmonary/Chest: Effort normal and breath sounds normal. No respiratory distress. She has no wheezes.  Abdominal: Soft. Bowel sounds are normal. She exhibits no distension. There is no tenderness. There is no rebound.  Musculoskeletal: Normal range of motion. She exhibits no edema.   Neurological: She is alert and oriented to person, place, and time. Coordination normal.  Skin: Skin is warm and dry. No rash noted. She is not diaphoretic.  Psychiatric: She has a normal mood and affect. Her behavior is normal.  Nursing note and vitals reviewed.     Assessment & Plan:   Problem List Items Addressed This Visit    None    Visit Diagnoses    Dysuria    -  Primary    Patient had dysuria yesterday but has improved today after doing cranberry juice and fluids. Urine dip shows 1+ blood and trace leuks, will run culture    Relevant Medications    nitrofurantoin, macrocrystal-monohydrate, (MACROBID) 100 MG capsule    Other Relevant Orders    Urinalysis    Urine culture        Follow up plan: Return if symptoms worsen or fail to improve.  Counseling provided for all of the vaccine components Orders Placed This Encounter  Procedures  . Urine culture  . Urinalysis    Arville Care, MD [facility] 04/11/2016, 8:45 AM

## 2016-04-12 LAB — URINE CULTURE: Organism ID, Bacteria: NO GROWTH

## 2016-04-13 LAB — URINALYSIS
BILIRUBIN UA: NEGATIVE
GLUCOSE, UA: NEGATIVE
KETONES UA: NEGATIVE
NITRITE UA: NEGATIVE
Protein, UA: NEGATIVE
Specific Gravity, UA: 1.01 (ref 1.005–1.030)
UUROB: 0.2 mg/dL (ref 0.2–1.0)
pH, UA: 5.5 (ref 5.0–7.5)

## 2016-04-27 ENCOUNTER — Telehealth: Payer: Self-pay | Admitting: Family Medicine

## 2016-04-27 NOTE — Telephone Encounter (Signed)
Patient states that she has stopped taking Crestor due to myalgias. She said that she would like to talk to you about this before going out of town Saturday. She states that she stopped the 2nd week of June and she is just now starting to stop hurting. Patient states she spoke with you and you suggested she break it in half and take 5mg . She wants to know if you recommend she still try this. Please advise

## 2016-04-30 MED ORDER — EZETIMIBE 10 MG PO TABS: 10.0000 mg | ORAL_TABLET | Freq: Every day | ORAL | Status: DC

## 2016-04-30 NOTE — Telephone Encounter (Signed)
Patient states that she does not want to take crestor any more. She also refuses any other statin medications.  I suggested she try Zetia 10mg  - take 1 tablet daily.

## 2016-05-12 ENCOUNTER — Encounter: Payer: Self-pay | Admitting: Family Medicine

## 2016-05-12 ENCOUNTER — Ambulatory Visit (INDEPENDENT_AMBULATORY_CARE_PROVIDER_SITE_OTHER): Payer: Medicare Other | Admitting: Family Medicine

## 2016-05-12 VITALS — BP 134/62 | HR 75 | Temp 97.8°F | Ht 61.0 in | Wt 123.0 lb

## 2016-05-12 DIAGNOSIS — I10 Essential (primary) hypertension: Secondary | ICD-10-CM | POA: Diagnosis not present

## 2016-05-12 DIAGNOSIS — E785 Hyperlipidemia, unspecified: Secondary | ICD-10-CM | POA: Diagnosis not present

## 2016-05-12 DIAGNOSIS — Z889 Allergy status to unspecified drugs, medicaments and biological substances status: Secondary | ICD-10-CM | POA: Diagnosis not present

## 2016-05-12 DIAGNOSIS — Z789 Other specified health status: Secondary | ICD-10-CM

## 2016-05-12 DIAGNOSIS — E559 Vitamin D deficiency, unspecified: Secondary | ICD-10-CM

## 2016-05-12 DIAGNOSIS — I700 Atherosclerosis of aorta: Secondary | ICD-10-CM

## 2016-05-12 NOTE — Patient Instructions (Addendum)
Medicare Annual Wellness Visit  [place] and the medical providers at [facility] strive to bring you the best medical care.  In doing so we not only want to address your current medical conditions and concerns but also to detect new conditions early and prevent illness, disease and health-related problems.    Medicare offers a yearly Wellness Visit which allows our clinical staff to assess your need for preventative services including immunizations, lifestyle education, counseling to decrease risk of preventable diseases and screening for fall risk and other medical concerns.    This visit is provided free of charge (no copay) for all Medicare recipients. The clinical pharmacists at [facility] have begun to conduct these Wellness Visits which will also include a thorough review of all your medications.    As you primary medical provider recommend that you make an appointment for your Annual Wellness Visit if you have not done so already this year.  You may set up this appointment before you leave today or you may call back [phone] and schedule an appointment.  Please make sure when you call that you mention that you are scheduling your Annual Wellness Visit with the clinical pharmacist so that the appointment may be made for the proper length of time.     Continue current medications. Continue good therapeutic lifestyle changes which include good diet and exercise. Fall precautions discussed with patient. If an FOBT was given today- please return it to our front desk. If you are over 75 years old - you may need Prevnar 13 or the adult Pneumonia vaccine.  **Flu shots are available--- please call and schedule a FLU-CLINIC appointment**  After your visit with us today you will receive a survey in the mail or online from [employer] regarding your care with us. Please take a moment to fill this out. Your feedback is very  important to us as you can help us better understand your patient needs as well as improve your experience and satisfaction. WE CARE ABOUT YOU!!!   Stay active physically Follow-up aggressive therapeutic lifestyle changes Drink plenty of fluids Follow-up with surgeon as planned for surgery in October

## 2016-05-12 NOTE — Progress Notes (Signed)
Subjective:    Patient ID: Dorothy Pitts, female    DOB: 1941/02/08, 75 y.o.   MRN: [id number]  HPI Pt here for follow up and management of chronic medical problems which includes hyperlipidemia. She is taking medications regularly.The patient has been followed by Dr. Ovidio Kin and hernia surgery on her abdomen is planned for sometime in October. This continues to be uncomfortable for her. The patient denies any chest pain or shortness of breath. She does have problems with indigestion at times secondary to what she eats but is not bothering her all the time. She has not seen any blood in the stool or had any black tarry bowel movements. She is passing her water without problems. Her allergies have been under good control with using the Flonase regularly and saline during the day. As of note, the patient started Crestor in April but had to stop it in June because of muscle aches and myalgias. She is currently taking Zetia and we will wait and see how she responds to this before we try another statin drug.      Patient Active Problem List   Diagnosis Date Noted  . Pre-diabetes 11/28/2015  . Hyperlipidemia 08/28/2015  . Fall at home 06/24/2015  . Thoracic aortic atherosclerosis (HCC) 04/15/2015  . Osteoporosis 11/07/2014  . Hypokalemia 10/08/2013  . Anemia 10/08/2013  . Diverticulosis of colon (without mention of hemorrhage)   . Hypertension   . IBS (irritable bowel syndrome)    Outpatient Encounter Prescriptions as of 05/12/2016  Medication Sig  . Ascorbic Acid (VITAMIN C) 1000 MG tablet Take 1,000 mg by mouth daily. Reported on 11/21/2015  . Cholecalciferol (VITAMIN D3) 2000 UNITS TABS Take 1 tablet by mouth daily.  . enalapril (VASOTEC) 5 MG tablet TAKE 1 TABLET BY MOUTH EVERY DAY AS DIRECTED  . ezetimibe (ZETIA) 10 MG tablet Take 1 tablet (10 mg total) by mouth daily.  . fluticasone (FLONASE) 50 MCG/ACT nasal spray Place 2 sprays into both nostrils daily.  . hyoscyamine  (LEVSIN, ANASPAZ) 0.125 MG tablet Take 1 tablet (0.125 mg total) by mouth every 4 (four) hours as needed.  . Multiple Vitamin (MULTIVITAMIN) tablet Take 1 tablet by mouth daily.  . raloxifene (EVISTA) 60 MG tablet TAKE 1 TABLET BY MOUTH ONCE A DAY  . spironolactone (ALDACTONE) 25 MG tablet TAKE 2 TABLETS BY MOUTH EVERY MORNING  . zolpidem (AMBIEN) 5 MG tablet TAKE 1 TABLET AT BEDTIME AS NEEDED  . [DISCONTINUED] nitrofurantoin, macrocrystal-monohydrate, (MACROBID) 100 MG capsule Take 1 capsule (100 mg total) by mouth 2 (two) times daily.   No facility-administered encounter medications on file as of 05/12/2016.     Review of Systems  Constitutional: Negative.   HENT: Negative.   Eyes: Negative.   Respiratory: Negative.   Cardiovascular: Negative.   Gastrointestinal: Negative.   Endocrine: Negative.   Genitourinary: Negative.   Musculoskeletal: Negative.   Skin: Negative.   Allergic/Immunologic: Negative.   Neurological: Negative.   Hematological: Negative.   Psychiatric/Behavioral: Negative.        Objective:   Physical Exam  Constitutional: She is oriented to person, place, and time. She appears well-developed and well-nourished. No distress.  HENT:  Head: Normocephalic and atraumatic.  Right Ear: External ear normal.  Left Ear: External ear normal.  Nose: Nose normal.  Mouth/Throat: Oropharynx is clear and moist.  Eyes: Conjunctivae and EOM are normal. Pupils are equal, round, and reactive to light. Right eye exhibits no discharge. Left eye exhibits no discharge. No  scleral icterus.  Neck: Normal range of motion. Neck supple. No thyromegaly present.  No carotid bruits  Cardiovascular: Normal rate, regular rhythm, normal heart sounds and intact distal pulses.   No murmur heard. Heart is regular at 72/m  Pulmonary/Chest: Effort normal and breath sounds normal. No respiratory distress. She has no wheezes. She has no rales. She exhibits no tenderness.  Clear anteriorly and  posteriorly  Abdominal: Soft. Bowel sounds are normal. She exhibits no mass. There is no tenderness. There is no rebound and no guarding.  Umbilical hernia present and bulging of the abdominal contents with the scar tissue in the lower abdomen.  Musculoskeletal: Normal range of motion. She exhibits no edema.  Lymphadenopathy:    She has no cervical adenopathy.  Neurological: She is alert and oriented to person, place, and time. She has normal reflexes. No cranial nerve deficit.  Skin: Skin is warm and dry. No rash noted.  Psychiatric: She has a normal mood and affect. Her behavior is normal. Judgment and thought content normal.  Nursing note and vitals reviewed.  BP 134/62 mmHg  Pulse 75  Temp(Src) 97.8 F (36.6 C) (Oral)  Ht 5\' 1"  (1.549 m)  Wt 123 lb (55.792 kg)  BMI 23.25 kg/m2        Assessment & Plan:  1. Essential hypertension -The blood pressure is good and she will continue with current treatment and sodium restriction-  2. Hyperlipidemia -The patient did not tolerate Crestor because of muscle aches and myalgias. She is currently trying Zetia and we will wait and see how she responds this before we try another statin at a low-dose.  3. Vitamin D deficiency -Continue current treatment  4. Thoracic aortic atherosclerosis (HCC) -Continue aggressive therapeutic lifestyle changes  5. Statin intolerance -Try Zetia as planned  Patient Instructions                       Medicare Annual Wellness Visit  [place] and the medical providers at [facility] strive to bring you the best medical care.  In doing so we not only want to address your current medical conditions and concerns but also to detect new conditions early and prevent illness, disease and health-related problems.    Medicare offers a yearly Wellness Visit which allows our clinical staff to assess your need for preventative services including immunizations, lifestyle education, counseling  to decrease risk of preventable diseases and screening for fall risk and other medical concerns.    This visit is provided free of charge (no copay) for all Medicare recipients. The clinical pharmacists at [facility] have begun to conduct these Wellness Visits which will also include a thorough review of all your medications.    As you primary medical provider recommend that you make an appointment for your Annual Wellness Visit if you have not done so already this year.  You may set up this appointment before you leave today or you may call back ([phone]) and schedule an appointment.  Please make sure when you call that you mention that you are scheduling your Annual Wellness Visit with the clinical pharmacist so that the appointment may be made for the proper length of time.     Continue current medications. Continue good therapeutic lifestyle changes which include good diet and exercise. Fall precautions discussed with patient. If an FOBT was given today- please return it to our front desk. If you are over 60 years old - you may  need Prevnar 13 or the adult Pneumonia vaccine.  **Flu shots are available--- please call and schedule a FLU-CLINIC appointment**  After your visit with us today you will receive a survey in the mail or online from [employer] regarding your care with us. Please take a moment to fill this out. Your feedback is very important to us as you can help us better understand your patient needs as well as improve your experience and satisfaction. WE CARE ABOUT YOU!!!   Stay active physically Follow-up aggressive therapeutic lifestyle changes Drink plenty of fluids Follow-up with surgeon as planned for surgery in October     Nyra Capeson W. Lulani Bour MD

## 2016-05-13 ENCOUNTER — Ambulatory Visit: Payer: Medicare Other

## 2016-05-21 ENCOUNTER — Other Ambulatory Visit: Payer: Self-pay | Admitting: Family Medicine

## 2016-05-28 ENCOUNTER — Other Ambulatory Visit: Payer: Self-pay | Admitting: Family Medicine

## 2016-08-25 ENCOUNTER — Encounter (HOSPITAL_COMMUNITY): Payer: Self-pay

## 2016-08-25 NOTE — Pre-Procedure Instructions (Addendum)
    Clearance CootsJudith M Neuberger  08/25/2016      [id number] - [place], [place] - [address] [place] [address] Phone: [phone] Fax: [phone]    Your procedure is scheduled on  Fri. Nov. 10  [facility] at 5:30 A.M.  Call this number if you have problems the morning of surgery:  [phone]   Remember:  Do not eat food or drink liquids after midnight on Thurs. Nov. 9  Take these medicines the morning of surgery with A SIP OF WATER : evista,              1 week prior to surgery stop vitamins/herbal medicines, aspirin, motrin, ibuprofen, advil, BC Powders, Goody's.   Do not wear jewelry, make-up or nail polish.  Do not wear lotions, powders, or perfumes, or deoderant.  Do not shave 48 hours prior to surgery.  Men may shave face and neck.  Do not bring valuables to the hospital.  [facility] is not responsible for any belongings or valuables.  Contacts, dentures or bridgework may not be worn into surgery.  Leave your suitcase in the car.  After surgery it may be brought to your room.  For patients admitted to the hospital, discharge time will be determined by your treatment team.  Patients discharged the day of surgery will not be allowed to drive home.   Name and phone number of your driver:   Special instructions:  Review preparing for surgery handout  Please read over the following fact sheets that you were given. Coughing and Deep Breathing

## 2016-08-26 ENCOUNTER — Encounter (HOSPITAL_COMMUNITY)
Admission: RE | Admit: 2016-08-26 | Discharge: 2016-08-26 | Disposition: A | Discharge status: Home / Self Care | Payer: Medicare Other | Source: Ambulatory Visit | Attending: Surgery | Admitting: Surgery

## 2016-08-26 ENCOUNTER — Encounter (HOSPITAL_COMMUNITY): Payer: Self-pay

## 2016-08-26 ENCOUNTER — Telehealth: Payer: Self-pay | Admitting: Family Medicine

## 2016-08-26 DIAGNOSIS — Z01810 Encounter for preprocedural cardiovascular examination: Secondary | ICD-10-CM | POA: Insufficient documentation

## 2016-08-26 DIAGNOSIS — Z01812 Encounter for preprocedural laboratory examination: Secondary | ICD-10-CM | POA: Insufficient documentation

## 2016-08-26 HISTORY — DX: Atherosclerosis of aorta: I70.0

## 2016-08-26 LAB — BASIC METABOLIC PANEL
Anion gap: 7 (ref 5–15)
BUN: 16 mg/dL (ref 6–20)
CALCIUM: 9.2 mg/dL (ref 8.9–10.3)
CO2: 27 mmol/L (ref 22–32)
CREATININE: 0.64 mg/dL (ref 0.44–1.00)
Chloride: 105 mmol/L (ref 101–111)
GFR calc Af Amer: >60 mL/min (ref >60)
Glucose, Bld: 106 mg/dL — ABNORMAL HIGH (ref 65–99)
Potassium: 3.9 mmol/L (ref 3.5–5.1)
SODIUM: 139 mmol/L (ref 135–145)

## 2016-08-26 LAB — CBC
HCT: 38.7 % (ref 36.0–46.0)
Hemoglobin: 12.8 g/dL (ref 12.0–15.0)
MCH: 31.4 pg (ref 26.0–34.0)
MCHC: 33.1 g/dL (ref 30.0–36.0)
MCV: 94.9 fL (ref 78.0–100.0)
PLATELETS: 253 10*3/uL (ref 150–400)
RBC: 4.08 MIL/uL (ref 3.87–5.11)
RDW: 12.3 % (ref 11.5–15.5)
WBC: 4.6 10*3/uL (ref 4.0–10.5)

## 2016-08-26 NOTE — Progress Notes (Addendum)
PCP: Dr. Vernon Preyon [place] in [place]  No cardiologist  08/25/16 called for orders. Nurse stated Dr. Ezzard StandingNewman out of office til Monday 08/31/16

## 2016-08-26 NOTE — Telephone Encounter (Signed)
Having hernia surgery-  dr Ezzard Standingnewman next Friday 09/04/16. Will start Zetia after surgery  - FYI

## 2016-08-31 ENCOUNTER — Other Ambulatory Visit: Payer: Self-pay | Admitting: Surgery

## 2016-09-03 ENCOUNTER — Other Ambulatory Visit: Payer: Self-pay | Admitting: Family Medicine

## 2016-09-03 NOTE — H&P (Signed)
[facility]  [facility] Patient #: [id number] DOB: 09-02-1941 Married / Language: English / Race: White Female  History of Present Illness   Patient words: discuss surgery.   The patient is a 75 year old female who presents with an incisional hernia.  Her PCP is Dr. Gari Crown. Moore.  She comes with her daughter, [name].  She comes for follow up of the abdominal wall hernia.  She is for laparoscopic ventral hernia repair on 09/04/2016. This was a preop visit. I went over the planned surgery, the length of hospitalization, and the potential complications. Her big concern is nausea. I told her it is not unusual to have nausea (ileus) for 2 to 5 days. This will probably be the pirmary limit to how long she will need to stay in the hospital. I had already given her copies of a hernia book. Her other issue is her multiple allergies.  History of abdominal wall hernia (09/2015): She comes with a lower abdominal wall ventral hernia. She thinks that the hernia may be a little larger, but she has no symptoms of obstruction. And she is not having pain or GI symptoms. We talked about exercise and its importance. I would not let the hernia keep her from exercise. She had a laparoscopic hand assited partial colectomy by Dr. Zenon MayoM. Jenkins on 12/01/2013 for diverticulitis. He did mobilize the splenic flexure. She had a CT scan on 06/29/2014 which shows a lower abdominal wall defect. She actually has an upper abdominal wall defect not mentioned on the report. We repeated the CT scan of her abdomen on 18 July 2015. This shows a slight increase in the lower abdominal wall hernia. They also raised a question of an epigastric hernia. The patient has noticed no change in symptoms. She does complain of a pooching of her lower abdomen, and somewhat Candis SchatzMichener her that she may need to see plastic surgery for this. I think some of the pooching is  from a lower abdominal laxity and this has not amendable to surgery. I think the ventral hernias are slightly larger.  I discussed the indications and complications of hernia surgery with the patient. I discussed both the laparoscopic and open approach to hernia repair. The potential risks of hernia surgery include, but are not limited to, bleeding, infection, open surgery, nerve injury, and recurrence of the hernia.   Past Medical Hsitory: 1) HTN x 10 years plus. 2) Osteroporosis 3) Colonoscopy by Dr. Nyra Capes. Patterson - 08/2012 Diverticulosis noted 4) Prior abdominal surgery: Hysterectomy remotely. Bilateral oophorectomy and appendectomy about 1996 Colon resection 11/2013 - M. Lovell SheehanJenkins - for diverticular disease. 5) Allergies: Keflex caused a rash in 2011, Cipro - but for unknown reasons  Social history: Widowed. She is not working. Her divorced son lives with her. She has 2 daughters who live near her. One of her daughters works as a Buyer, retailrespiratory therapist at [employer].    Allergies (Ammie Eversole, LPN; 46/96/295210/26/2017 10:27 AM) Ciprofloxacin *CHEMICALS* Lovaza *ANTIHYPERLIPIDEMICS* Fosamax *ENDOCRINE AND METABOLIC AGENTS - MISC.* Actonel *ENDOCRINE AND METABOLIC AGENTS - MISC.*  Medication History (Ammie Eversole, LPN; 84/13/244010/26/2017 10:27 AM) Zolpidem Tartrate (5MG  Tablet, Oral) Active. Enalapril Maleate (5MG  Tablet, Oral) Active. Spironolactone (25MG  Tablet, Oral) Active. Mupirocin (2% Ointment, External) Active. Raloxifene HCl (60MG  Tablet, Oral) Active. Medications Reconciled  Vitals (Ammie Eversole LPN; 10/27/253610/26/2017 10:39 AM) 08/20/2016 10:27 AM Weight: 124 lb Height: 61in Body Surface Area: 1.54 m Body Mass Index: 23.43 kg/m  Temp.: 1F(Oral)  Pulse: 95 (Regular)  BP: 168/80 (Sitting, Left Arm, Standard) Rechecked B/P @ 10:39am; 132/70   Physical Exam  General: WN WF alert and generally healthy  appearing. She looks younger than her stated age. HEENT: Normal. Pupils equal.  Neck: Supple. No mass. No thyroid mass.  Lymph Nodes: No supraclavicular or cervical nodes.  Lungs: Clear to auscultation and symmetric breath sounds. Heart: RRR. No murmur or rub.  Abdomen: Soft. No mass. No tenderness. Normal bowel sounds. Midline incision. She has a paplable lower abdominal wall hernia with an approximate 4 cm defect.  The hernia is reducible. I think that what is bulging out is larger than when I last saw her. I'm not sure that I feel the epigastric hernia.  Extremities: Good strength and ROM in upper and lower extremities.  Neurologic: Grossly intact to motor and sensory function.  Assessment & Plan  1.  VENTRAL INCISIONAL HERNIA WITHOUT OBSTRUCTION OR GANGRENE (K43.2)  Plan:  1) Laparoscopic [facility] ventral [facility]al [facility]  2.  HTN x 10 years plus. 3. Osteroporosis 4.  Colonoscopy by Dr. Nyra Capes. Patterson - 08/2012 Diverticulosis noted 5. Prior abdominal surgery:  Hysterectomy remotely.  Bilateral oophorectomy and appendectomy about 1996  Colon resection 11/2013 - M. Lovell SheehanJenkins - for diverticular disease. 6. Allergies: Keflex caused a rash in 2011, Cipro - but for unknown reasons   Dorothy Kinavid Crystallee Werden, MD, [facility] Pager: [phone] Office phone:  [phone]

## 2016-09-04 ENCOUNTER — Encounter (HOSPITAL_COMMUNITY)
Admission: RE | Disposition: A | Discharge status: Home / Self Care | Payer: Self-pay | Source: Ambulatory Visit | Attending: Surgery

## 2016-09-04 ENCOUNTER — Ambulatory Visit (HOSPITAL_COMMUNITY): Payer: Medicare Other | Admitting: Anesthesiology

## 2016-09-04 ENCOUNTER — Inpatient Hospital Stay (HOSPITAL_COMMUNITY)
Admission: RE | Admit: 2016-09-04 | Discharge: 2016-09-10 | DRG: 336 | Disposition: A | Discharge status: Home / Self Care | Payer: Medicare Other | Source: Ambulatory Visit | Attending: Surgery | Admitting: Surgery

## 2016-09-04 ENCOUNTER — Encounter (HOSPITAL_COMMUNITY): Payer: Self-pay | Admitting: Critical Care Medicine

## 2016-09-04 DIAGNOSIS — K432 Incisional hernia without obstruction or gangrene: Principal | ICD-10-CM | POA: Diagnosis present

## 2016-09-04 DIAGNOSIS — Z79899 Other long term (current) drug therapy: Secondary | ICD-10-CM

## 2016-09-04 DIAGNOSIS — Z9049 Acquired absence of other specified parts of digestive tract: Secondary | ICD-10-CM

## 2016-09-04 DIAGNOSIS — Z881 Allergy status to other antibiotic agents status: Secondary | ICD-10-CM

## 2016-09-04 DIAGNOSIS — K660 Peritoneal adhesions (postprocedural) (postinfection): Secondary | ICD-10-CM | POA: Diagnosis present

## 2016-09-04 DIAGNOSIS — K567 Ileus, unspecified: Secondary | ICD-10-CM | POA: Diagnosis not present

## 2016-09-04 DIAGNOSIS — Z79810 Long term (current) use of selective estrogen receptor modulators (SERMs): Secondary | ICD-10-CM | POA: Diagnosis not present

## 2016-09-04 DIAGNOSIS — M810 Age-related osteoporosis without current pathological fracture: Secondary | ICD-10-CM | POA: Diagnosis present

## 2016-09-04 DIAGNOSIS — I10 Essential (primary) hypertension: Secondary | ICD-10-CM | POA: Diagnosis present

## 2016-09-04 DIAGNOSIS — K439 Ventral hernia without obstruction or gangrene: Secondary | ICD-10-CM | POA: Diagnosis present

## 2016-09-04 DIAGNOSIS — Z888 Allergy status to other drugs, medicaments and biological substances status: Secondary | ICD-10-CM | POA: Diagnosis not present

## 2016-09-04 HISTORY — PX: INSERTION OF MESH: SHX5868

## 2016-09-04 HISTORY — PX: INCISIONAL HERNIA REPAIR: SHX193

## 2016-09-04 HISTORY — PX: HERNIA REPAIR: SHX51

## 2016-09-04 SURGERY — REPAIR, HERNIA, INCISIONAL, LAPAROSCOPIC
Anesthesia: General | Site: Abdomen

## 2016-09-04 MED ORDER — HEPARIN SODIUM (PORCINE) 5000 UNIT/ML IJ SOLN
5000.0000 [IU] | Freq: Three times a day (TID) | INTRAMUSCULAR | Status: DC
  Administered 2016-09-04 – 2016-09-10 (×16): 5000 [IU] via SUBCUTANEOUS
  Filled 2016-09-04 (×17): qty 1

## 2016-09-04 MED ORDER — MEPERIDINE HCL 25 MG/ML IJ SOLN: 6.2500 mg | INTRAMUSCULAR | Status: DC | PRN

## 2016-09-04 MED ORDER — BUPIVACAINE HCL (PF) 0.25 % IJ SOLN
INTRAMUSCULAR | Status: AC
  Filled 2016-09-04: qty 30

## 2016-09-04 MED ORDER — FENTANYL CITRATE (PF) 100 MCG/2ML IJ SOLN
INTRAMUSCULAR | Status: AC
  Filled 2016-09-04: qty 2

## 2016-09-04 MED ORDER — DEXAMETHASONE SODIUM PHOSPHATE 10 MG/ML IJ SOLN
INTRAMUSCULAR | Status: DC | PRN
  Administered 2016-09-04: 10 mg via INTRAVENOUS

## 2016-09-04 MED ORDER — CHLORHEXIDINE GLUCONATE CLOTH 2 % EX PADS: 6.0000 | MEDICATED_PAD | Freq: Once | CUTANEOUS | Status: DC

## 2016-09-04 MED ORDER — PHENYLEPHRINE HCL 10 MG/ML IJ SOLN
INTRAMUSCULAR | Status: DC | PRN
  Administered 2016-09-04 (×2): 80 ug via INTRAVENOUS

## 2016-09-04 MED ORDER — FENTANYL CITRATE (PF) 100 MCG/2ML IJ SOLN
INTRAMUSCULAR | Status: DC | PRN
Start: 2016-09-04 — End: 2016-09-04
  Administered 2016-09-04: 100 ug via INTRAVENOUS
  Administered 2016-09-04 (×2): 50 ug via INTRAVENOUS
  Administered 2016-09-04: 100 ug via INTRAVENOUS

## 2016-09-04 MED ORDER — SUGAMMADEX SODIUM 200 MG/2ML IV SOLN
INTRAVENOUS | Status: DC | PRN
  Administered 2016-09-04: 150 mg via INTRAVENOUS

## 2016-09-04 MED ORDER — FLUTICASONE PROPIONATE 50 MCG/ACT NA SUSP
2.0000 | Freq: Every day | NASAL | Status: DC
  Administered 2016-09-04 – 2016-09-09 (×6): 2 via NASAL
  Filled 2016-09-04 (×2): qty 16

## 2016-09-04 MED ORDER — ROCURONIUM BROMIDE 100 MG/10ML IV SOLN
INTRAVENOUS | Status: DC | PRN
  Administered 2016-09-04: 20 mg via INTRAVENOUS
  Administered 2016-09-04: 50 mg via INTRAVENOUS

## 2016-09-04 MED ORDER — KCL IN DEXTROSE-NACL 20-5-0.45 MEQ/L-%-% IV SOLN
INTRAVENOUS | Status: DC
  Administered 2016-09-04 – 2016-09-08 (×7): via INTRAVENOUS
  Filled 2016-09-04 (×7): qty 1000

## 2016-09-04 MED ORDER — ZOLPIDEM TARTRATE 5 MG PO TABS
5.0000 mg | ORAL_TABLET | Freq: Every day | ORAL | Status: DC
  Administered 2016-09-04 – 2016-09-09 (×6): 5 mg via ORAL
  Filled 2016-09-04 (×7): qty 1

## 2016-09-04 MED ORDER — ACETAMINOPHEN 500 MG PO TABS
1000.0000 mg | ORAL_TABLET | ORAL | Status: AC
  Administered 2016-09-04: 1000 mg via ORAL
  Filled 2016-09-04: qty 2

## 2016-09-04 MED ORDER — SUGAMMADEX SODIUM 200 MG/2ML IV SOLN
INTRAVENOUS | Status: AC
  Filled 2016-09-04: qty 2

## 2016-09-04 MED ORDER — ACETAMINOPHEN 325 MG PO TABS
650.0000 mg | ORAL_TABLET | Freq: Four times a day (QID) | ORAL | Status: DC | PRN
  Administered 2016-09-04 – 2016-09-10 (×4): 650 mg via ORAL
  Filled 2016-09-04 (×4): qty 2

## 2016-09-04 MED ORDER — MUPIROCIN 2 % EX OINT
TOPICAL_OINTMENT | CUTANEOUS | Status: AC
  Filled 2016-09-04: qty 22

## 2016-09-04 MED ORDER — PROPOFOL 10 MG/ML IV BOLUS
INTRAVENOUS | Status: AC
  Filled 2016-09-04: qty 20

## 2016-09-04 MED ORDER — ONDANSETRON HCL 4 MG/2ML IJ SOLN
INTRAMUSCULAR | Status: AC
  Filled 2016-09-04: qty 2

## 2016-09-04 MED ORDER — PHENYLEPHRINE HCL 10 MG/ML IJ SOLN
INTRAVENOUS | Status: DC | PRN
  Administered 2016-09-04: 30 ug/min via INTRAVENOUS

## 2016-09-04 MED ORDER — CEFAZOLIN SODIUM-DEXTROSE 2-4 GM/100ML-% IV SOLN
INTRAVENOUS | Status: AC
  Filled 2016-09-04: qty 100

## 2016-09-04 MED ORDER — LACTATED RINGERS IV SOLN
INTRAVENOUS | Status: DC | PRN
  Administered 2016-09-04 (×2): via INTRAVENOUS

## 2016-09-04 MED ORDER — FENTANYL CITRATE (PF) 100 MCG/2ML IJ SOLN
25.0000 ug | INTRAMUSCULAR | Status: DC | PRN
  Administered 2016-09-04 (×2): 25 ug via INTRAVENOUS

## 2016-09-04 MED ORDER — MIDAZOLAM HCL 2 MG/2ML IJ SOLN
INTRAMUSCULAR | Status: AC
  Filled 2016-09-04: qty 2

## 2016-09-04 MED ORDER — LIDOCAINE HCL (CARDIAC) 20 MG/ML IV SOLN
INTRAVENOUS | Status: DC | PRN
  Administered 2016-09-04: 100 mg via INTRAVENOUS

## 2016-09-04 MED ORDER — SPIRONOLACTONE 25 MG PO TABS
25.0000 mg | ORAL_TABLET | Freq: Every morning | ORAL | Status: DC
  Administered 2016-09-05 – 2016-09-09 (×5): 25 mg via ORAL
  Filled 2016-09-04 (×6): qty 1

## 2016-09-04 MED ORDER — ACETAMINOPHEN 650 MG RE SUPP: 650.0000 mg | Freq: Four times a day (QID) | RECTAL | Status: DC | PRN

## 2016-09-04 MED ORDER — SODIUM CHLORIDE 0.9 % IR SOLN
Status: DC | PRN
  Administered 2016-09-04: 1000 mL

## 2016-09-04 MED ORDER — ONDANSETRON 4 MG PO TBDP
4.0000 mg | ORAL_TABLET | Freq: Four times a day (QID) | ORAL | Status: DC | PRN
Start: 2016-09-04 — End: 2016-09-10
  Administered 2016-09-09 – 2016-09-10 (×2): 4 mg via ORAL
  Filled 2016-09-04 (×3): qty 1

## 2016-09-04 MED ORDER — HYDROCODONE-ACETAMINOPHEN 5-325 MG PO TABS
1.0000 | ORAL_TABLET | ORAL | Status: DC | PRN
  Filled 2016-09-04: qty 2

## 2016-09-04 MED ORDER — MORPHINE SULFATE (PF) 2 MG/ML IV SOLN
1.0000 mg | INTRAVENOUS | Status: DC | PRN
  Administered 2016-09-04 – 2016-09-09 (×22): 2 mg via INTRAVENOUS
  Filled 2016-09-04 (×4): qty 1
  Filled 2016-09-04: qty 2
  Filled 2016-09-04 (×5): qty 1
  Filled 2016-09-04: qty 2
  Filled 2016-09-04 (×11): qty 1

## 2016-09-04 MED ORDER — ONDANSETRON HCL 4 MG/2ML IJ SOLN
4.0000 mg | Freq: Four times a day (QID) | INTRAMUSCULAR | Status: DC | PRN
  Administered 2016-09-04 – 2016-09-08 (×7): 4 mg via INTRAVENOUS
  Filled 2016-09-04 (×7): qty 2

## 2016-09-04 MED ORDER — SCOPOLAMINE 1 MG/3DAYS TD PT72
MEDICATED_PATCH | TRANSDERMAL | Status: DC | PRN
  Administered 2016-09-04: 1 via TRANSDERMAL

## 2016-09-04 MED ORDER — BUPIVACAINE HCL (PF) 0.25 % IJ SOLN
INTRAMUSCULAR | Status: DC | PRN
  Administered 2016-09-04: 30 mL

## 2016-09-04 MED ORDER — ENALAPRIL MALEATE 5 MG PO TABS
5.0000 mg | ORAL_TABLET | Freq: Every day | ORAL | Status: DC
  Administered 2016-09-04 – 2016-09-09 (×6): 5 mg via ORAL
  Filled 2016-09-04 (×7): qty 1

## 2016-09-04 MED ORDER — METOCLOPRAMIDE HCL 5 MG/ML IJ SOLN: 10.0000 mg | Freq: Once | INTRAMUSCULAR | Status: DC | PRN

## 2016-09-04 MED ORDER — GABAPENTIN 300 MG PO CAPS
300.0000 mg | ORAL_CAPSULE | ORAL | Status: AC
  Administered 2016-09-04: 300 mg via ORAL
  Filled 2016-09-04: qty 1

## 2016-09-04 MED ORDER — MIDAZOLAM HCL 5 MG/5ML IJ SOLN
INTRAMUSCULAR | Status: DC | PRN
  Administered 2016-09-04: 2 mg via INTRAVENOUS

## 2016-09-04 MED ORDER — 0.9 % SODIUM CHLORIDE (POUR BTL) OPTIME
TOPICAL | Status: DC | PRN
  Administered 2016-09-04: 1000 mL

## 2016-09-04 MED ORDER — ONDANSETRON HCL 4 MG/2ML IJ SOLN
INTRAMUSCULAR | Status: DC | PRN
  Administered 2016-09-04: 4 mg via INTRAVENOUS

## 2016-09-04 MED ORDER — PROPOFOL 10 MG/ML IV BOLUS
INTRAVENOUS | Status: DC | PRN
  Administered 2016-09-04: 140 mg via INTRAVENOUS

## 2016-09-04 MED ORDER — VANCOMYCIN HCL IN DEXTROSE 1-5 GM/200ML-% IV SOLN
1000.0000 mg | INTRAVENOUS | Status: AC
  Administered 2016-09-04: 1000 mg via INTRAVENOUS
  Filled 2016-09-04: qty 200

## 2016-09-04 SURGICAL SUPPLY — 55 items
APPLIER CLIP LOGIC TI 5 (MISCELLANEOUS) IMPLANT
APPLIER CLIP ROT 10 11.4 M/L (STAPLE)
BENZOIN TINCTURE PRP APPL 2/3 (GAUZE/BANDAGES/DRESSINGS) ×3 IMPLANT
BINDER ABDOMINAL 12 ML 46-62 (SOFTGOODS) ×3 IMPLANT
BLADE SURG ROTATE 9660 (MISCELLANEOUS) IMPLANT
CANISTER SUCTION 2500CC (MISCELLANEOUS) IMPLANT
CLIP APPLIE ROT 10 11.4 M/L (STAPLE) IMPLANT
COVER SURGICAL LIGHT HANDLE (MISCELLANEOUS) ×3 IMPLANT
DECANTER SPIKE VIAL GLASS SM (MISCELLANEOUS) ×3 IMPLANT
DERMABOND ADVANCED (GAUZE/BANDAGES/DRESSINGS) ×4
DERMABOND ADVANCED .7 DNX12 (GAUZE/BANDAGES/DRESSINGS) ×2 IMPLANT
DEVICE SECURE STRAP 25 ABSORB (INSTRUMENTS) ×6 IMPLANT
DEVICE TROCAR PUNCTURE CLOSURE (ENDOMECHANICALS) ×3 IMPLANT
DRAPE INCISE IOBAN 66X45 STRL (DRAPES) ×3 IMPLANT
DRAPE LAPAROSCOPIC ABDOMINAL (DRAPES) ×3 IMPLANT
DRSG PAD ABDOMINAL 8X10 ST (GAUZE/BANDAGES/DRESSINGS) ×9 IMPLANT
ELECT REM PT RETURN 9FT ADLT (ELECTROSURGICAL) ×3
ELECTRODE REM PT RTRN 9FT ADLT (ELECTROSURGICAL) ×1 IMPLANT
GLOVE BIO SURGEON STRL SZ7 (GLOVE) ×3 IMPLANT
GLOVE BIOGEL PI IND STRL 6.5 (GLOVE) ×1 IMPLANT
GLOVE BIOGEL PI IND STRL 7.0 (GLOVE) ×1 IMPLANT
GLOVE BIOGEL PI INDICATOR 6.5 (GLOVE) ×2
GLOVE BIOGEL PI INDICATOR 7.0 (GLOVE) ×2
GLOVE ECLIPSE 7.0 STRL STRAW (GLOVE) ×6 IMPLANT
GLOVE INDICATOR 7.0 STRL GRN (GLOVE) ×3 IMPLANT
GLOVE SURG SIGNA 7.5 PF LTX (GLOVE) ×3 IMPLANT
GLOVE SURG SS PI 6.5 STRL IVOR (GLOVE) ×6 IMPLANT
GOWN STRL REUS W/ TWL LRG LVL3 (GOWN DISPOSABLE) ×3 IMPLANT
GOWN STRL REUS W/ TWL XL LVL3 (GOWN DISPOSABLE) ×1 IMPLANT
GOWN STRL REUS W/TWL LRG LVL3 (GOWN DISPOSABLE) ×6
GOWN STRL REUS W/TWL XL LVL3 (GOWN DISPOSABLE) ×2
KIT BASIN OR (CUSTOM PROCEDURE TRAY) ×3 IMPLANT
KIT ROOM TURNOVER OR (KITS) ×3 IMPLANT
MARKER SKIN DUAL TIP RULER LAB (MISCELLANEOUS) ×3 IMPLANT
MESH PARIETEX 25X20 (Mesh General) ×3 IMPLANT
NEEDLE SPNL 22GX3.5 QUINCKE BK (NEEDLE) ×3 IMPLANT
NS IRRIG 1000ML POUR BTL (IV SOLUTION) ×3 IMPLANT
PAD ARMBOARD 7.5X6 YLW CONV (MISCELLANEOUS) ×6 IMPLANT
SCALPEL HARMONIC ACE (MISCELLANEOUS) ×3 IMPLANT
SCISSORS LAP 5X35 DISP (ENDOMECHANICALS) ×3 IMPLANT
SET IRRIG TUBING LAPAROSCOPIC (IRRIGATION / IRRIGATOR) IMPLANT
SLEEVE ENDOPATH XCEL 5M (ENDOMECHANICALS) ×3 IMPLANT
SLEEVE SURGEON STRL (DRAPES) ×3 IMPLANT
SUT MON AB 5-0 PS2 18 (SUTURE) ×3 IMPLANT
SUT NOVA 0 T19/GS 22DT (SUTURE) ×3 IMPLANT
SUT NOVA NAB DX-16 0-1 5-0 T12 (SUTURE) ×3 IMPLANT
TOWEL OR 17X24 6PK STRL BLUE (TOWEL DISPOSABLE) ×3 IMPLANT
TOWEL OR 17X26 10 PK STRL BLUE (TOWEL DISPOSABLE) ×3 IMPLANT
TRAY FOLEY CATH 16FR SILVER (SET/KITS/TRAYS/PACK) IMPLANT
TRAY LAPAROSCOPIC MC (CUSTOM PROCEDURE TRAY) ×3 IMPLANT
TROCAR ADV FIXATION 5X100MM (TROCAR) ×3 IMPLANT
TROCAR XCEL BLUNT TIP 100MML (ENDOMECHANICALS) IMPLANT
TROCAR XCEL NON-BLD 11X100MML (ENDOMECHANICALS) ×3 IMPLANT
TROCAR XCEL NON-BLD 5MMX100MML (ENDOMECHANICALS) ×3 IMPLANT
TUBING INSUFFLATION (TUBING) ×3 IMPLANT

## 2016-09-04 NOTE — Anesthesia Procedure Notes (Signed)
Procedure Name: Intubation Date/Time: 09/04/2016 7:37 AM Performed by: Samara DeistBECKNER, Britny Riel B Pre-anesthesia Checklist: Patient identified, Emergency Drugs available, Suction available, Patient being monitored and Timeout performed Patient Re-evaluated:Patient Re-evaluated prior to inductionOxygen Delivery Method: Circle system utilized Preoxygenation: Pre-oxygenation with 100% oxygen Intubation Type: IV induction Ventilation: Mask ventilation without difficulty Laryngoscope Size: Mac and 4 Grade View: Grade II Tube type: Oral Tube size: 7.0 mm Number of attempts: 1 Airway Equipment and Method: Stylet Placement Confirmation: ETT inserted through vocal cords under direct vision,  positive ETCO2 and breath sounds checked- equal and bilateral Secured at: 22 cm Tube secured with: Tape Dental Injury: Teeth and Oropharynx as per pre-operative assessment

## 2016-09-04 NOTE — Interval H&P Note (Signed)
History and Physical Interval Note:  09/04/2016 7:24 AM  [name]  has presented today for surgery, with the diagnosis of VENTRAL INCISIONAL HERNIA REPAIR  The various methods of treatment have been discussed with the patient and family.   Daughter in room.    After consideration of risks, benefits and other options for treatment, the patient has consented to  Procedure(s): LAPAROSCOPIC VENTRAL INCISIONAL HERNIA REPAIR POSSIBLE OPEN (N/A) INSERTION OF MESH (N/A) as a surgical intervention .  The patient's history has been reviewed, patient examined, no change in status, stable for surgery.  I have reviewed the patient's chart and labs.  Questions were answered to the patient's satisfaction.     Mat Stuard H

## 2016-09-04 NOTE — Transfer of Care (Signed)
Immediate Anesthesia Transfer of Care Note  Patient: Clearance CootsJudith M Portlock  Procedure(s) Performed: Procedure(s): LAPAROSCOPIC VENTRAL INCISIONAL HERNIA REPAIR POSSIBLE OPEN (N/A) INSERTION OF MESH (N/A)  Patient Location: PACU  Anesthesia Type:General  Level of Consciousness: awake, alert , oriented and patient cooperative  Airway & Oxygen Therapy: Patient Spontanous Breathing and Patient connected to nasal cannula oxygen  Post-op Assessment: Report given to RN, Post -op Vital signs reviewed and stable and Patient moving all extremities  Post vital signs: Reviewed and stable  Last Vitals:  Vitals:   09/04/16 0634 09/04/16 1012  BP: (!) 158/57 (!) 180/71  Pulse: 74 (!) 109  Resp: 18 13  Temp: 36.9 C 36.7 C    Last Pain:  Vitals:   09/04/16 0634  TempSrc: Oral      Patients Stated Pain Goal: 3 (09/04/16 [id number])  Complications: No apparent anesthesia complications

## 2016-09-04 NOTE — Anesthesia Postprocedure Evaluation (Signed)
Anesthesia Post Note  Patient: Dorothy Pitts  Procedure(s) Performed: Procedure(s) (LRB): LAPAROSCOPIC VENTRAL INCISIONAL HERNIA REPAIR POSSIBLE OPEN (N/A) INSERTION OF MESH (N/A)  Patient location during evaluation: PACU Anesthesia Type: General Level of consciousness: awake and alert Pain management: pain level controlled Vital Signs Assessment: post-procedure vital signs reviewed and stable Respiratory status: spontaneous breathing, nonlabored ventilation, respiratory function stable and patient connected to nasal cannula oxygen Cardiovascular status: blood pressure returned to baseline and stable Postop Assessment: no signs of nausea or vomiting Anesthetic complications: no    Last Vitals:  Vitals:   09/04/16 1245 09/04/16 1314  BP: (!) 133/59 (!) 133/55  Pulse: 79 77  Resp: 17 16  Temp:  37.2 C    Last Pain:  Vitals:   09/04/16 1314  TempSrc: Oral  PainSc:                  Phillips Groutarignan, [name]

## 2016-09-04 NOTE — Anesthesia Preprocedure Evaluation (Addendum)
Anesthesia Evaluation  Patient identified by MRN, date of birth, ID band Patient awake    Reviewed: Allergy & Precautions, NPO status , Patient's Chart, lab work & pertinent test results  History of Anesthesia Complications (+) PONV  Airway Mallampati: II  TM Distance: >3 FB Neck ROM: Full    Dental no notable dental hx. (+) Teeth Intact   Pulmonary neg pulmonary ROS, former smoker,    Pulmonary exam normal breath sounds clear to auscultation       Cardiovascular hypertension, Pt. on medications Normal cardiovascular exam Rhythm:Regular Rate:Normal     Neuro/Psych negative neurological ROS  negative psych ROS   GI/Hepatic Neg liver ROS, GERD  Medicated and Controlled,  Endo/Other  negative endocrine ROS  Renal/GU negative Renal ROS  negative genitourinary   Musculoskeletal negative musculoskeletal ROS (+)   Abdominal   Peds negative pediatric ROS (+)  Hematology negative hematology ROS (+)   Anesthesia Other Findings   Reproductive/Obstetrics negative OB ROS                           Anesthesia Physical Anesthesia Plan  ASA: II  Anesthesia Plan: General   Post-op Pain Management:    Induction: Intravenous  Airway Management Planned: Oral ETT  Additional Equipment:   Intra-op Plan:   Post-operative Plan: Extubation in OR  Informed Consent: I have reviewed the patients History and Physical, chart, labs and discussed the procedure including the risks, benefits and alternatives for the proposed anesthesia with the patient or authorized representative who has indicated his/her understanding and acceptance.   Dental advisory given  Plan Discussed with: CRNA  Anesthesia Plan Comments:         Anesthesia Quick Evaluation

## 2016-09-04 NOTE — Op Note (Signed)
OPERATIVE NOTE  09/04/2016  9:59 AM  PATIENT:  Dorothy Pitts, 75 y.o., female, MRN: [id number]  PREOP DIAGNOSIS:  VENTRAL INCISIONAL HERNIA REPAIR  POSTOP DIAGNOSIS:   Ventral incisional hernia (11 x 6 cm main lower fascial defect, length of overall defects 19 cm), Adhesions of omentum, colon, and small bowel in the hernia  PROCEDURE:   Procedure(s): LAPAROSCOPIC VENTRAL INCISIONAL HERNIA REPAIR,  INSERTION OF MESH, Enterolysis of adhesions (20 minute) [photos at the end of the op note]  SURGEON:   Ovidio Kinavid Ezeriah Luty, M.D.  ASSISTANT:   None  ANESTHESIA:   general  Anesthesiologist: Phillips GroutPeter Carignan, MD CRNA: Samara DeistAlisha B Beckner, CRNA; Andree ElkMichael L McMillen, CRNA  General  EBL:  minimal  ml  BLOOD ADMINISTERED: none  DRAINS: none   LOCAL MEDICATIONS USED:   30 1/4% marcaine  SPECIMEN:   None  COUNTS CORRECT:  YES  INDICATIONS FOR PROCEDURE:  Dorothy CootsJudith M Fero is a 75 y.o. (DOB: 04/16/1941) white female whose primary care physician is Rudi HeapMOORE, DONALD, MD and comes for laparoscopic ventral hernia repair   The indications and risks of the surgery were explained to the patient.  The risks include, but are not limited to, infection, bleeding, and nerve injury.  OPERATIVE NOTE:  The [facility]  She underwent a general anesthesia.  He was given Vancomycin at the beginning of the operation.   A time out was held and the surgical checklist run.   The abdomen was prepped with Cholroprep and sterilely draped.  I covered the abdomen with a Ioban drape.   I accessed the LUQ with a 5 mm trocar.  An additional 5 mm trocar was placed in the left mid abdomen and a 5 mm trocar in the right upper quadrant.   She had extensive adhesions to the anterior peritoneum and in the hernia.  She had an 11 x 6 cm fascial defect below the umbilicus.  Then going cranially about 8 cm, she had swiss cheese defect of the fascia.  So the total length of fascial defects covered 19  cm.   I closed the main fascial defect with three 1 Novafil.  This pulled the lower fascia and rectus muscles towards the midline.   Then I placed a 25 x 15 cm Parietex mesh in the abdomen.  It had 10 holding sutures of 0 Novafil.  These were tied down to secure the mesh to the anterior abdominal wall.   I placed 2 additional 0 Novafil sutures to support the lower aspect of the mesh.  So there were a total of 12 Novafil holding sutures.  I then used 46 Securestrap tacks to tack the mesh to the anterior abdominal wall.   The abdomen was deflated.  The mesh was inspected and there were no defects around the edge.   The trocars were then removed.  There was no bleeding at the trocar sites.  The incisions were closed with 4-0 Monocryl and painted with DermaBond. The puncture sites were painted with DermaBond.   The sponge and needle count were correct.  The patient had an abdominal binder placed.  The patient was transferred to the recovery room in good condition.    Type of repair - mesh  (choices - primary suture, mesh, or component)  Name of mesh - Parietex  Size of mesh - Length 25 cm, Width 15 cm  Mesh overlap - 5+ cm  Placement of mesh - beneath fascia and into peritoneal  cavity  (choices - beneath fascia and into peritoneal cavity, beneath fascia but external to peritoneal cavity, between the muscle and fascia, above or external to fascia)  [Photos are in reverse order]  View of mesh from RUQ to LUQ   View of mesh from RUQ to LLQ   Lower abdominal fascia closed with 1 Novafill   Upper abdominal "swiss cheese" defects   Lower abdomdinal fascia defect   Omentum and bowel in hernia defect  Ovidio Kinavid Arlet Marter, MD, [facility] Pager: [phone] Office phone:  [phone]

## 2016-09-04 NOTE — Telephone Encounter (Signed)
Refill called to [facility] 

## 2016-09-05 MED ORDER — FAMOTIDINE 20 MG PO TABS
20.0000 mg | ORAL_TABLET | Freq: Two times a day (BID) | ORAL | Status: DC
  Administered 2016-09-05 – 2016-09-09 (×9): 20 mg via ORAL
  Filled 2016-09-05 (×10): qty 1

## 2016-09-05 MED ORDER — SIMETHICONE 80 MG PO CHEW
80.0000 mg | CHEWABLE_TABLET | Freq: Four times a day (QID) | ORAL | Status: DC | PRN
  Administered 2016-09-05 – 2016-09-08 (×3): 80 mg via ORAL
  Filled 2016-09-05 (×3): qty 1

## 2016-09-05 MED ORDER — OXYCODONE HCL 5 MG PO TABS
5.0000 mg | ORAL_TABLET | ORAL | Status: DC | PRN
  Administered 2016-09-05 – 2016-09-10 (×24): 10 mg via ORAL
  Filled 2016-09-05 (×23): qty 2

## 2016-09-05 MED ORDER — SALINE SPRAY 0.65 % NA SOLN
1.0000 | NASAL | Status: DC | PRN
  Administered 2016-09-06: 1 via NASAL
  Filled 2016-09-05: qty 44

## 2016-09-05 NOTE — Progress Notes (Signed)
Patient ID: Clearance CootsJudith M Elizarraraz, female   DOB: May 27, 1941, 75 y.o.   MRN: [id number]  [facility]  1 Day Post-Op  Subjective: Doing ok this morning. States that vicodin causes her to vomit. Ambulated a little yesterday. No flatus. Binder improves pain but is too large. Tolerating liquids.  Objective: Vital signs in last 24 hours: Temp:  [97 F (36.1 C)-99 F (37.2 C)] 98.6 F (37 C) (11/11 0610) Pulse Rate:  [72-109] 73 (11/11 0610) Resp:  [11-19] 17 (11/11 0610) BP: (103-180)/(43-71) 103/54 (11/11 0610) SpO2:  [93 %-100 %] 98 % (11/11 0610) Last BM Date: 09/03/16  Intake/Output from previous day: 11/10 0701 - 11/11 0700 In: 2741.3 [P.O.:120; I.V.:2621.3] Out: 625 [Urine:600; Blood:25] Intake/Output this shift: No intake/output data recorded.  PE: Gen:  Alert, NAD, pleasant Card:  RRR Pulm:  CTAB Abd: Soft, ND, appropriately tender, +BS, multiple lap incisions C/D/I  Lab Results:  No results for input(s): WBC, HGB, HCT, PLT in the last 72 hours. BMET No results for input(s): NA, K, CL, [name], GLUCOSE, BUN, CREATININE, CALCIUM in the last 72 hours. PT/INR No results for input(s): LABPROT, INR in the last 72 hours. CMP     Component Value Date/Time   NA 139 08/26/2016 1158   NA 140 03/18/2016 1057   K 3.9 08/26/2016 1158   CL 105 08/26/2016 1158   [name] 27 08/26/2016 1158   GLUCOSE 106 (H) 08/26/2016 1158   BUN 16 08/26/2016 1158   BUN 18 03/18/2016 1057   CREATININE 0.64 08/26/2016 1158   CALCIUM 9.2 08/26/2016 1158   PROT 6.1 03/18/2016 1057   ALBUMIN 4.2 03/18/2016 1057   AST 15 03/18/2016 1057   ALT 16 03/18/2016 1057   ALKPHOS 46 03/18/2016 1057   BILITOT 0.4 03/18/2016 1057   GFRNONAA >60 08/26/2016 1158   GFRAA >60 08/26/2016 1158   Lipase     Component Value Date/Time   LIPASE 24 10/07/2013 1743       Studies/Results: No results found.  Anti-infectives: Anti-infectives    Start     Dose/Rate Route Frequency  Ordered Stop   09/04/16 [id number]  ceFAZolin (ANCEF) 2-4 GM/100ML-% IVPB    Comments:  Dia CrawfordGallman, Kathie   : cabinet override      09/04/16 [id number] 09/04/16 1914   09/04/16 0636  vancomycin (VANCOCIN) IVPB 1000 mg/200 mL premix     1,000 mg 200 mL/hr over 60 Minutes Intravenous On call to O.R. 09/04/16 0636 09/04/16 0828       Assessment/Plan S/p Procedure(s): LAPAROSCOPIC VENTRAL INCISIONAL HERNIA REPAIR,  INSERTION OF MESH, Enterolysis of adhesions (20 minute) 09/04/16 Dr. Ezzard StandingNewman - POD 1 - no flatus - cannot take hydrocodone  HTN - enalapril Osteoporosis  FEN - clears, IVF VTE - heparin  Plan - continue clears and IVF. Encouraged more ambulation today. D/c vicodin and add oxycodone; wean off IV pain medication. Please see if we could get her a smaller abdominal binder.   LOS: 1 day    Edson SnowballBROOKE A MILLER , [facility] 09/05/2016, 9:01 AM Pager: [phone] Consults: [phone] Mon-Fri 7:00 am-4:30 pm Sat-Sun 7:00 am-11:30 am

## 2016-09-06 MED ORDER — METHOCARBAMOL 1000 MG/10ML IJ SOLN
500.0000 mg | Freq: Three times a day (TID) | INTRAVENOUS | Status: DC | PRN
  Administered 2016-09-06 – 2016-09-09 (×7): 500 mg via INTRAVENOUS
  Filled 2016-09-06 (×12): qty 5

## 2016-09-06 NOTE — Progress Notes (Signed)
Patient ID: Dorothy Pitts, female   DOB: February 10, 1941, 75 y.o.   MRN: [id number]  [facility]  2 Days Post-Op  Subjective: Still requiring some IV pain medication, states that the pain is severe when she moves in/out of bed.  Has been ambulating  No flatus or BM  Objective: Vital signs in last 24 hours: Temp:  [97.8 F (36.6 C)-98.6 F (37 C)] 98.6 F (37 C) (11/12 0640) Pulse Rate:  [72-77] 72 (11/12 0640) Resp:  [16-18] 17 (11/12 0640) BP: (105-141)/(46-65) 130/59 (11/12 0640) SpO2:  [97 %-100 %] 98 % (11/12 0640) Last BM Date: 09/03/16  Intake/Output from previous day: 11/11 0701 - 11/12 0700 In: 2587.5 [P.O.:960; I.V.:1627.5] Out: -  Intake/Output this shift: No intake/output data recorded.  PE: Gen:  Alert, NAD, pleasant Card:  RRR Pulm:  CTAB Abd: Soft, mild distension, appropriately tender, +BS, multiple lap incisions C/D/I   Lab Results:  No results for input(s): WBC, HGB, HCT, PLT in the last 72 hours. BMET No results for input(s): NA, K, CL, [name], GLUCOSE, BUN, CREATININE, CALCIUM in the last 72 hours. PT/INR No results for input(s): LABPROT, INR in the last 72 hours. CMP     Component Value Date/Time   NA 139 08/26/2016 1158   NA 140 03/18/2016 1057   K 3.9 08/26/2016 1158   CL 105 08/26/2016 1158   [name] 27 08/26/2016 1158   GLUCOSE 106 (H) 08/26/2016 1158   BUN 16 08/26/2016 1158   BUN 18 03/18/2016 1057   CREATININE 0.64 08/26/2016 1158   CALCIUM 9.2 08/26/2016 1158   PROT 6.1 03/18/2016 1057   ALBUMIN 4.2 03/18/2016 1057   AST 15 03/18/2016 1057   ALT 16 03/18/2016 1057   ALKPHOS 46 03/18/2016 1057   BILITOT 0.4 03/18/2016 1057   GFRNONAA >60 08/26/2016 1158   GFRAA >60 08/26/2016 1158   Lipase     Component Value Date/Time   LIPASE 24 10/07/2013 1743       Studies/Results: No results found.  Anti-infectives: Anti-infectives    Start     Dose/Rate Route Frequency Ordered Stop   09/04/16 [id number]   ceFAZolin (ANCEF) 2-4 GM/100ML-% IVPB    Comments:  Dia CrawfordGallman, Kathie   : cabinet override      09/04/16 [id number] 09/04/16 1914   09/04/16 0636  vancomycin (VANCOCIN) IVPB 1000 mg/200 mL premix     1,000 mg 200 mL/hr over 60 Minutes Intravenous On call to O.R. 09/04/16 0636 09/04/16 0828       Assessment/Plan S/p Procedure(s): LAPAROSCOPIC VENTRAL INCISIONAL HERNIA REPAIR, INSERTION OF MESH, Enterolysis of adhesions (20 minute) 09/04/16 Dr. Ezzard StandingNewman - POD 2 - no flatus  HTN - enalapril Osteoporosis  FEN - clears, IVF VTE - heparin  Plan - continue clears and IVF until bowel function returns. Add robaxin for better pain control. Continue ambulating.   LOS: 2 days    Edson SnowballBROOKE A MILLER , [facility] 09/06/2016, 11:12 AM Pager: [phone] Consults: [phone] Mon-Fri 7:00 am-4:30 pm Sat-Sun 7:00 am-11:30 am

## 2016-09-07 ENCOUNTER — Encounter (HOSPITAL_COMMUNITY): Payer: Self-pay | Admitting: Surgery

## 2016-09-07 NOTE — Consult Note (Signed)
[facility] CM Primary Care Navigator  09/07/2016  Zuriel M Mcwright 10/11/1941 [id number]     Met with patient and son (Drew) at the bedside to identify discharge needs.  Patient reports growing protrusion to her right lower abdomen had led to this admission/ surgery.  [facility].   [facility].  She reports managing her own medications at home straight out of the containers.   Patient lives with her divorced son as stated. She verbalized beingindependent with self care and able to drive prior to admission/ surgery. Her children can be able to provide transportation to herdoctors'appointmentsif needed.  Plan for discharge is home per patient. Daughters (Lauren and Marri) as well as son will be able to serve as primary caregivers for her at home as needed.   Patient expressedunderstanding to call primary care provider's office when she returns home, for a post discharge follow-up appointment within a week or sooner if needs arise.  Patient denies furtherneeds or concerns at this time.   For additional questions please contact:  Lorraine A. Ajel, BSN, RN-BC [facility] PRIMARY CARE Navigator Cell: [phone] 

## 2016-09-07 NOTE — Progress Notes (Signed)
[facility] Office:  [phone] General Surgery Progress Note   LOS: 3 days  POD -  3 Days Post-Op  Assessment/Plan: 1.  LAPAROSCOPIC VENTRAL INCISIONAL HERNIA REPAIR,  INSERTION OF MESH - 09/04/2016 - D. Markeita Alicia  Still no flatus - post op ileus  Will try to advance to full liquds, but patient knows to go slowly  2.  HTN x 10 years plus. 3. Osteroporosis 4. Prior abdominal surgery:        Hysterectomy remotely.        Bilateral oophorectomy and appendectomy about 1996         Colon resection 11/2013 - M. Lovell SheehanJenkins - for diverticular disease.  5.  DVT prophylaxis - SQ Heparin   Active Problems:   Ventral hernia   Subjective:  No flatus or BM.  Other daughter in room with patient.  Objective:   Vitals:   09/06/16 0640 09/06/16 2015  BP: (!) 130/59 137/60  Pulse: 72 81  Resp: 17 19  Temp: 98.6 F (37 C) 99.5 F (37.5 C)     Intake/Output from previous day:  No intake/output data recorded.  Intake/Output this shift:  No intake/output data recorded.   Physical Exam:   General: WN Older WF who is alert and oriented.    HEENT: Normal. Pupils equal. .   Lungs: Clear,  IS = 1,000 cc   Abdomen: Mild distention, [name], HGB, HCT, [name], K, CL, [name], GLUCOSE, BUN, CREATININE, [name], [name], [name], PO2   Studies/Results:  No results found.   Anti-infectives:   Anti-infectives    Start     Dose/Rate Route Frequency Ordered Stop   09/04/16 0712  ceFAZolin (ANCEF) 2-4 GM/100ML-% IVPB    Comments:  Dia CrawfordGallman, Kathie   : cabinet override      09/04/16 0712 09/04/16 1914   09/04/16 0636  vancomycin (VANCOCIN) IVPB 1000 mg/200 mL  premix     1,000 mg 200 mL/hr over 60 Minutes Intravenous On call to O.R. 09/04/16 0636 09/04/16 [id number]      Ovidio Kinavid Janthony Holleman, MD, FACS Pager: [phone] [facility] Office: [phone] 09/07/2016

## 2016-09-07 NOTE — Plan of Care (Signed)
Problem: Activity: Goal: Risk for activity intolerance will decrease Outcome: Progressing Pt ambulated 2 laps on unit this pm, no concerns noted  Problem: Fluid Volume: Goal: Ability to maintain a balanced intake and output will improve Pt now on a full liquid diet, intake remains poor  Problem: Bowel/Gastric: Goal: Will not experience complications related to bowel motility Outcome: Progressing Pt has not had a bowel movement yet but is passing gas

## 2016-09-08 NOTE — Progress Notes (Signed)
[facility] Office:  [phone] General Surgery Progress Note   LOS: 4 days  POD -  4 Days Post-Op  Assessment/Plan: 1.  LAPAROSCOPIC VENTRAL INCISIONAL HERNIA REPAIR,  INSERTION OF MESH - 09/04/2016 - D. Nahiara Kretzschmar  Passing flatus, no BM.  Will advance to reg diet.  Continue to encourage to move.  2.  HTN x 10 years plus. 3. Osteroporosis 4. Prior abdominal surgery:        Hysterectomy remotely.        Bilateral oophorectomy and appendectomy about 1996         Colon resection 11/2013 - M. Lovell SheehanJenkins - for diverticular disease.  5.  DVT prophylaxis - SQ Heparin   Active Problems:   Ventral hernia  Subjective:  Passing flatus, no BM.  She is moving a little better.  Daughter in room with patient.  Objective:   Vitals:   09/07/16 1300 09/08/16 0520  BP: 136/61 (!) 112/46  Pulse: 76 75  Resp:  18  Temp: 99.3 F (37.4 C) 98.8 F (37.1 C)     Intake/Output from previous day:  11/13 0701 - 11/14 0700 In: 3779.5 [I.V.:3779.5] Out: -   Intake/Output this shift:  No intake/output data recorded.   Physical Exam:   General: WN Older WF who is alert and oriented.    HEENT: Normal. Pupils equal. .   Lungs: Clear   Abdomen: Mild distention, [name], HGB, HCT, [name], K, CL, [name], GLUCOSE, BUN, CREATININE, [name], [name], [name], PO2   Studies/Results:  No results found.   Anti-infectives:   Anti-infectives    Start     Dose/Rate Route Frequency Ordered Stop   09/04/16 0712  ceFAZolin (ANCEF) 2-4 GM/100ML-% IVPB    Comments:  Dia CrawfordGallman, Kathie   : cabinet override      09/04/16 0712 09/04/16 1914   09/04/16 0636  vancomycin  (VANCOCIN) IVPB 1000 mg/200 mL premix     1,000 mg 200 mL/hr over 60 Minutes Intravenous On call to O.R. 09/04/16 0636 09/04/16 [id number]      Ovidio Kinavid Ashantia Amaral, MD, FACS Pager: [phone] [facility] Office: [phone] 09/08/2016

## 2016-09-08 NOTE — Care Management Important Message (Signed)
Important Message  Patient Details  Name: Dorothy CootsJudith M Pitts MRN: [id number] Date of Birth: Mar 04, 1941   Medicare Important Message Given:  Yes    Joandy Burget 09/08/2016, 11:12 AM

## 2016-09-09 MED ORDER — BISACODYL 10 MG RE SUPP
10.0000 mg | Freq: Every day | RECTAL | Status: DC
  Administered 2016-09-09: 10 mg via RECTAL
  Filled 2016-09-09 (×2): qty 1

## 2016-09-09 MED ORDER — METHOCARBAMOL 500 MG PO TABS
500.0000 mg | ORAL_TABLET | Freq: Three times a day (TID) | ORAL | Status: DC | PRN
  Administered 2016-09-09 – 2016-09-10 (×2): 500 mg via ORAL
  Filled 2016-09-09 (×2): qty 1

## 2016-09-09 MED ORDER — DOCUSATE SODIUM 100 MG PO CAPS
100.0000 mg | ORAL_CAPSULE | Freq: Every day | ORAL | Status: DC
  Administered 2016-09-09: 100 mg via ORAL
  Filled 2016-09-09 (×2): qty 1

## 2016-09-09 MED ORDER — ALUM & MAG HYDROXIDE-SIMETH 200-200-20 MG/5ML PO SUSP
30.0000 mL | ORAL | Status: DC | PRN
  Administered 2016-09-09 (×2): 30 mL via ORAL
  Filled 2016-09-09 (×2): qty 30

## 2016-09-09 NOTE — Progress Notes (Signed)
[facility] Office:  [phone] General Surgery Progress Note   LOS: 5 days  POD -  5 Days Post-Op  Assessment/Plan: 1.  LAPAROSCOPIC VENTRAL INCISIONAL HERNIA REPAIR,  INSERTION OF MESH - 09/04/2016 - D. Chieko Neises  Passing flatus, still no BM.  Taking some regular food.  To give dulcolax suppository and colace.  She is on acetaminophin (she can't take NSAIDs secondary to allergies) and knows to limit narcotics  2.  HTN x 10 years plus. 3. Osteroporosis 4. Prior abdominal surgery:        Hysterectomy remotely.        Bilateral oophorectomy and appendectomy about 1996         Colon resection 11/2013 - M. Lovell SheehanJenkins - for diverticular disease.  5.  DVT prophylaxis - SQ Heparin   Active Problems:   Ventral hernia  Subjective:  Passing flatus, no BM.  Not really nauseated.  Other daughter in room with patient.  Objective:   Vitals:   09/08/16 0520 09/08/16 2135  BP: (!) 112/46 (!) 129/52  Pulse: 75 76  Resp: 18 19  Temp: 98.8 F (37.1 C) 98.5 F (36.9 C)     Intake/Output from previous day:  11/14 0701 - 11/15 0700 In: 1640.7 [P.O.:560; [name]; [name],[name], [name], HGB, HCT, [name], K, CL, [name], GLUCOSE, BUN, CREATININE, [name], [name], [name], PO2   Studies/Results:  No results found.   Anti-infectives:   Anti-infectives    Start     Dose/Rate Route Frequency Ordered Stop    09/04/16 0712  ceFAZolin (ANCEF) 2-4 GM/100ML-% IVPB    Comments:  Dia CrawfordGallman, Kathie   : cabinet override      09/04/16 0712 09/04/16 1914   09/04/16 0636  vancomycin (VANCOCIN) IVPB 1000 mg/200 mL premix     1,000 mg 200 mL/hr over 60 Minutes Intravenous On call to O.R. 09/04/16 0636 09/04/16 [id number]      Ovidio Kinavid Alyric Parkin, MD, [name] 09/09/2016

## 2016-09-10 MED ORDER — OXYCODONE HCL 5 MG PO TABS: 5.0000 mg | ORAL_TABLET | Freq: Four times a day (QID) | ORAL | 0 refills | Status: DC | PRN

## 2016-09-10 MED ORDER — ONDANSETRON 4 MG PO TBDP: 4.0000 mg | ORAL_TABLET | Freq: Four times a day (QID) | ORAL | 0 refills | Status: DC | PRN

## 2016-09-10 NOTE — Progress Notes (Signed)
Pt discharged to home.  Discharge instructions explained to pt.  Pt has no questions at the time of discharge.  IV removed.  Pt states she has all belongings.  Pt taken off unit via wheelchair by volunteer services.   

## 2016-09-10 NOTE — Discharge Summary (Signed)
Physician Discharge Summary  Patient ID:  Dorothy Pitts  MRN: [id number]  DOB/AGE: 03/28/1941 75 y.o.  Admit date: 09/04/2016 Discharge date: 09/10/2016  Discharge Diagnoses:  1.  Ventral incisional hernia 2. HTN x 10 years plus. 3.Osteroporosis 4.Prior abdominal surgery: Hysterectomy remotely. Bilateral oophorectomy and appendectomy about 1996 Colon resection 11/2013 - M. Lovell SheehanJenkins - for diverticular disease.   Active Problems:   Ventral hernia  Operation: Procedure(s): LAPAROSCOPIC VENTRAL INCISIONAL HERNIA REPAIR,  INSERTION OF MESH on 09/04/2016 [facility]  Discharged Condition: good  Hospital Course: Dorothy Pitts is an 75 y.o. female whose primary care physician is Rudi HeapMOORE, DONALD, MD and who was admitted 09/04/2016 with a chief complaint of No chief complaint on file.    She was brought to the operating room on 09/04/2016 and underwent  LAPAROSCOPIC VENTRAL INCISIONAL HERNIA REPAIR,  INSERTION OF MESH.   Post op she did well, though she has an ileus that lasted about 4 days.  She has had BMs and she is tolerating food.  She is ready to go home.  Her daughter is in the room with her.  The discharge instructions were reviewed with the patient.  Consults: None  Significant Diagnostic Studies: Results for orders placed or performed during the hospital encounter of 08/26/16  CBC  Result Value Ref Range   WBC 4.6 4.0 - 10.5 K/uL   RBC 4.08 3.87 - 5.11 MIL/uL   Hemoglobin 12.8 12.0 - 15.0 g/dL   HCT 78.238.7 95.636.0 - 21.346.0 %   MCV 94.9 78.0 - 100.0 fL   MCH 31.4 26.0 - 34.0 pg   MCHC 33.1 30.0 - 36.0 g/dL   RDW 08.612.3 57.811.5 - 46.915.5 %   Platelets 253 150 - 400 K/uL  Basic metabolic panel  Result Value Ref Range   Sodium 139 135 - 145 mmol/L   Potassium 3.9 3.5 - 5.1 mmol/L   Chloride 105 101 - 111 mmol/L   [name] 27 22 - 32 mmol/L   Glucose, Bld 106 (H) 65 - 99 mg/dL   BUN 16 6 - 20 mg/dL   Creatinine, Ser 6.290.64 0.44 - 1.00 mg/dL   Calcium 9.2 8.9 - 52.810.3 mg/dL   GFR calc non Af Amer >60 >60 mL/min   GFR calc Af Amer >60 >60 mL/min   Anion gap 7 5 - 15    No results found.  Discharge Exam:  Vitals:   09/09/16 2100 09/10/16 0606  BP: (!) 106/47 (!) 131/47  Pulse: 82 78  Resp: 19   Temp: 98.1 F (36.7 C) 98.1 F (36.7 C)    General: WN Older WF who is alert and generally healthy appearing.   She is a little hard of hearing. Lungs: Clear to auscultation and symmetric breath sounds. Heart:  RRR. No murmur or rub. Abdomen: Soft. No mass.  Normal bowel sounds. Incisions look good.  She has some bruising of the abdominal wall and she has mild distention.  Discharge Medications:     Medication List    TAKE these medications   calcium carbonate 1500 (600 Ca) MG Tabs tablet Commonly known as:  OSCAL Take 600 mg of elemental calcium by mouth daily.   enalapril 5 MG tablet Commonly known as:  VASOTEC TAKE 1 TABLET BY MOUTH EVERY DAY AS DIRECTED What changed:  See the new instructions.   ezetimibe 10 MG tablet Commonly known as:  ZETIA Take 1 tablet (10 mg total) by mouth daily.   fluticasone 50 MCG/ACT nasal spray Commonly  known as:  FLONASE Place 2 sprays into both nostrils daily. What changed:  when to take this   hyoscyamine 0.125 MG tablet Commonly known as:  LEVSIN, ANASPAZ Take 1 tablet (0.125 mg total) by mouth every 4 (four) hours as needed.   multivitamin tablet Take 1 tablet by mouth daily.   ondansetron 4 MG disintegrating tablet Commonly known as:  ZOFRAN-ODT Take 1 tablet (4 mg total) by mouth every 6 (six) hours as needed for nausea.   oxyCODONE 5 MG immediate release tablet Commonly known as:  Oxy IR/ROXICODONE Take 1 tablet (5 mg total) by mouth every 6 (six) hours as needed for moderate pain.   raloxifene 60 MG tablet Commonly known as:  EVISTA TAKE 1 TABLET BY MOUTH ONCE A DAY   spironolactone 25 MG tablet Commonly known as:  ALDACTONE TAKE 2 TABLETS BY MOUTH EVERY  MORNING What changed:  See the new instructions.   vitamin C 1000 MG tablet Take 1,000 mg by mouth daily. Reported on 11/21/2015   Vitamin D3 2000 units Tabs Take 1 tablet by mouth daily.   zolpidem 5 MG tablet Commonly known as:  AMBIEN TAKE 1 TABLET BY MOUTH AT BEDTIME What changed:  See the new instructions.       Disposition: 01-Home or Self Care  Discharge Instructions    Diet - low sodium heart healthy    Complete by:  As directed    Increase activity slowly    Complete by:  As directed      Activity:  Driving - May drive in 3 or 4 days, if doing well and off pain meds   Lifting - No lifting more than 15 pounds  Wound Care:   May shower          Wear abdominal binder if possible  Diet:  As tolerated.       Drink lots of water.  Follow up appointment:  Call Dr. Allene PyoNewman's office [facility]) at [phone] for an appointment in 2 to 3 weeks.  Medications and dosages:  Resume your home medications.  You have a prescription for:  Oxycodone and Zofran.   Signed: Ovidio Kinavid Vada Swift, M.D., [facility] Office:  [phone]  09/10/2016, 7:48 AM

## 2016-09-10 NOTE — Discharge Instructions (Signed)
[facility] - DISCHARGE INSTRUCTIONS TO PATIENT  Activity:  Driving - May drive in 3 or 4 days, if doing well and off pain meds   Lifting - No lifting more than 15 pounds  Wound Care:   May shower          Wear abdominal binder if possible  Diet:  As tolerated.       Drink lots of water.  Follow up appointment:  Call Dr. Allene PyoNewman's office [facility]) at [phone] for an appointment in 2 to 3 weeks.  Medications and dosages:  Resume your home medications.  You have a prescription for:  Oxycodone and Zofran.  Call Dr. Ezzard StandingNewman or his office  [phone]) if you have:  Temperature greater than 100.4,  Persistent nausea and vomiting,  Severe uncontrolled pain,  Redness, tenderness, or signs of infection (pain, swelling, redness, odor or green/yellow discharge around the site),  Any other questions or concerns you may have after discharge.  In an emergency, call 911 or go to an Emergency Department at a nearby hospital.

## 2016-09-12 ENCOUNTER — Encounter (HOSPITAL_COMMUNITY): Payer: Self-pay | Admitting: *Deleted

## 2016-09-12 DIAGNOSIS — R1084 Generalized abdominal pain: Secondary | ICD-10-CM | POA: Insufficient documentation

## 2016-09-12 DIAGNOSIS — I10 Essential (primary) hypertension: Secondary | ICD-10-CM | POA: Diagnosis not present

## 2016-09-12 DIAGNOSIS — R197 Diarrhea, unspecified: Secondary | ICD-10-CM | POA: Insufficient documentation

## 2016-09-12 DIAGNOSIS — Z79899 Other long term (current) drug therapy: Secondary | ICD-10-CM | POA: Insufficient documentation

## 2016-09-12 DIAGNOSIS — R109 Unspecified abdominal pain: Secondary | ICD-10-CM | POA: Diagnosis present

## 2016-09-12 DIAGNOSIS — Z87891 Personal history of nicotine dependence: Secondary | ICD-10-CM | POA: Diagnosis not present

## 2016-09-12 LAB — CBC
HCT: 34.9 % — ABNORMAL LOW (ref 36.0–46.0)
HEMOGLOBIN: 11.6 g/dL — AB (ref 12.0–15.0)
MCH: 31.6 pg (ref 26.0–34.0)
MCHC: 33.2 g/dL (ref 30.0–36.0)
MCV: 95.1 fL (ref 78.0–100.0)
PLATELETS: 379 10*3/uL (ref 150–400)
RBC: 3.67 MIL/uL — AB (ref 3.87–5.11)
RDW: 12.4 % (ref 11.5–15.5)
WBC: 6.7 10*3/uL (ref 4.0–10.5)

## 2016-09-12 NOTE — ED Triage Notes (Signed)
THE PT IS C/O DIARRHEA SINCE Friday SHE HAD HERNIA SURGERY RT LOWER ABD ONE WEEK AGO.  HER DOCTOR TOLD HER TO COME HERE

## 2016-09-13 ENCOUNTER — Emergency Department (HOSPITAL_COMMUNITY)
Admission: EM | Admit: 2016-09-13 | Discharge: 2016-09-13 | Disposition: A | Discharge status: Home / Self Care | Payer: Medicare Other | Attending: Emergency Medicine | Admitting: Emergency Medicine

## 2016-09-13 DIAGNOSIS — R197 Diarrhea, unspecified: Secondary | ICD-10-CM

## 2016-09-13 DIAGNOSIS — R1084 Generalized abdominal pain: Secondary | ICD-10-CM

## 2016-09-13 LAB — COMPREHENSIVE METABOLIC PANEL
ALK PHOS: 48 U/L (ref 38–126)
ALT: 77 U/L — AB (ref 14–54)
ANION GAP: 8 (ref 5–15)
AST: 71 U/L — ABNORMAL HIGH (ref 15–41)
Albumin: 3.2 g/dL — ABNORMAL LOW (ref 3.5–5.0)
BILIRUBIN TOTAL: 0.5 mg/dL (ref 0.3–1.2)
BUN: 12 mg/dL (ref 6–20)
CALCIUM: 9.1 mg/dL (ref 8.9–10.3)
CO2: 29 mmol/L (ref 22–32)
CREATININE: 0.67 mg/dL (ref 0.44–1.00)
Chloride: 101 mmol/L (ref 101–111)
Glucose, Bld: 108 mg/dL — ABNORMAL HIGH (ref 65–99)
Potassium: 4.5 mmol/L (ref 3.5–5.1)
SODIUM: 138 mmol/L (ref 135–145)
TOTAL PROTEIN: 5.9 g/dL — AB (ref 6.5–8.1)

## 2016-09-13 LAB — LIPASE, BLOOD: Lipase: 44 U/L (ref 11–51)

## 2016-09-13 MED ORDER — ACETAMINOPHEN 325 MG PO TABS
650.0000 mg | ORAL_TABLET | Freq: Once | ORAL | Status: AC
  Administered 2016-09-13: 650 mg via ORAL
  Filled 2016-09-13: qty 2

## 2016-09-13 MED ORDER — SODIUM CHLORIDE 0.9 % IV BOLUS (SEPSIS)
1000.0000 mL | Freq: Once | INTRAVENOUS | Status: AC
  Administered 2016-09-13: 1000 mL via INTRAVENOUS

## 2016-09-13 NOTE — ED Provider Notes (Signed)
[facility]-EMERGENCY DEPT Provider Note   CSN: [id number] Arrival date & time: 09/12/16  2301  By signing my name below, I, Dorothy Pitts, attest that this documentation has been prepared under the direction and in the presence of Zadie Rhineonald Michaelangelo Mittelman, MD. Electronically Signed: Ether GriffinsKayla Pitts, ED Scribe. 09/13/16. 1:31 AM.    History   Chief Complaint Chief Complaint  Patient presents with  . Abdominal Pain    The history is provided by the patient. No language interpreter was used.     HPI Comments: [name] is a 75 y.o. female s/p hernia repair 09/04/16 who presents to the Emergency Department complaining of persistent diarrhea since yesterday (15x yesterday). Pt was discharged after hernia repair 3 days ago; hospital course was notable for ileus that lasted 4 days. Pt received stool softeners and a suppository while admitted with resolution of ileus, and has not had any of these medications since. Pt states she has not taken any laxatives in the last 24 hours. She was not discharged with any antibiotics. Pt states she contacted her surgeon, who recommended Imodium, but pt states this medication did not relieve her symptoms and she was then referred here. Pt also notes cramping abdominal pain that began after taking Imodium. Pt denies dysuria, bloody stool, fever, vomiting, SOB, cough, back pain.   Past Medical History:  Diagnosis Date  . Acute diverticulitis 10/07/2013   With microperforation.  . Bowel obstruction   . Cataract   . Diverticulosis of colon (without mention of hemorrhage)   . GERD (gastroesophageal reflux disease)   . Hemorrhoids   . HOH (hard of hearing)   . Hyperlipidemia   . Hypertension   . IBS (irritable bowel syndrome)   . Osteopenia   . Osteoporosis   . PONV (postoperative nausea and vomiting)   . Thoracic aortic atherosclerosis (HCC)   . UTI (lower urinary tract infection) 10/07/2013  . Vitamin D deficiency     Patient Active Problem List   Diagnosis Date Noted  . Ventral hernia 09/04/2016  . Pre-diabetes 11/28/2015  . Hyperlipidemia 08/28/2015  . Fall at home 06/24/2015  . Thoracic aortic atherosclerosis (HCC) 04/15/2015  . Osteoporosis 11/07/2014  . Hypokalemia 10/08/2013  . Anemia 10/08/2013  . Diverticulosis of colon (without mention of hemorrhage)   . Hypertension   . IBS (irritable bowel syndrome)     Past Surgical History:  Procedure Laterality Date  . ABDOMINAL HYSTERECTOMY    . APPENDECTOMY    . BILATERAL OOPHORECTOMY    . BREAST LUMPECTOMY Left   . CATARACT EXTRACTION    . COLON RESECTION N/A 12/01/2013   Procedure: HAND ASSISTED LAPAROSCOPIC PARTIAL COLECTOMY;  Surgeon: Dalia HeadingMark A Jenkins, MD;  Location: [facility];  Service: General;  Laterality: N/A;  . EYE SURGERY    . HEMORRHOID SURGERY    . HERNIA REPAIR  09/04/2016   LAPAROSCOPIC VENTRAL INCISIONAL HERNIA REPAIR POSSIBLE OPEN (N/A)  . INCISIONAL HERNIA REPAIR N/A 09/04/2016   Procedure: LAPAROSCOPIC VENTRAL INCISIONAL HERNIA REPAIR POSSIBLE OPEN;  Surgeon: Ovidio Kinavid Newman, MD;  [facility];  Service: General;  Laterality: N/A;  . INSERTION OF MESH N/A 09/04/2016   Procedure: INSERTION OF MESH;  Surgeon: Ovidio Kinavid Newman, MD;  Location: [facility] OR;  Service: General;  Laterality: N/A;  . PARTIAL HYSTERECTOMY    . TONSILLECTOMY      OB History    No data available       Home Medications    Prior to Admission medications   Medication Sig Start  Date End Date Taking? Authorizing Provider  Ascorbic Acid (VITAMIN C) 1000 MG tablet Take 1,000 mg by mouth daily. Reported on 11/21/2015    Historical Provider, MD  calcium carbonate (OSCAL) 1500 (600 Ca) MG TABS tablet Take 600 mg of elemental calcium by mouth daily.    Historical Provider, MD  Cholecalciferol (VITAMIN D3) 2000 UNITS TABS Take 1 tablet by mouth daily.    Historical Provider, MD  enalapril (VASOTEC) 5 MG tablet TAKE 1 TABLET BY MOUTH EVERY DAY AS DIRECTED Patient taking differently: TAKE 1 TABLET BY  MOUTH EVERY DAY 05/28/16   Ernestina Penna, MD  ezetimibe (ZETIA) 10 MG tablet Take 1 tablet (10 mg total) by mouth daily. Patient not taking: Reported on 08/25/2016 04/30/16   Henrene Pastor, PharmD  fluticasone (FLONASE) 50 MCG/ACT nasal spray Place 2 sprays into both nostrils daily. Patient taking differently: Place 2 sprays into both nostrils at bedtime.  08/02/14   Ernestina Penna, MD  hyoscyamine (LEVSIN, ANASPAZ) 0.125 MG tablet Take 1 tablet (0.125 mg total) by mouth every 4 (four) hours as needed. Patient not taking: Reported on 08/25/2016 01/02/16   Ernestina Penna, MD  Multiple Vitamin (MULTIVITAMIN) tablet Take 1 tablet by mouth daily.    Historical Provider, MD  ondansetron (ZOFRAN-ODT) 4 MG disintegrating tablet Take 1 tablet (4 mg total) by mouth every 6 (six) hours as needed for nausea. 09/10/16   Ovidio Kin, MD  oxyCODONE (OXY IR/ROXICODONE) 5 MG immediate release tablet Take 1 tablet (5 mg total) by mouth every 6 (six) hours as needed for moderate pain. 09/10/16   Ovidio Kin, MD  raloxifene (EVISTA) 60 MG tablet TAKE 1 TABLET BY MOUTH ONCE A DAY 05/28/16   Ernestina Penna, MD  spironolactone (ALDACTONE) 25 MG tablet TAKE 2 TABLETS BY MOUTH EVERY MORNING Patient taking differently: TAKE 1 TABLET (25 MG) BY MOUTH EVERY MORNING 05/21/16   Ernestina Penna, MD  zolpidem (AMBIEN) 5 MG tablet TAKE 1 TABLET BY MOUTH AT BEDTIME 09/04/16   Frederica Kuster, MD    Family History Family History  Problem Relation Age of Onset  . Hypertension Mother   . Heart attack Mother 51  . Parkinson's disease Father   . Heart disease Brother 45    bypass  . Obesity Brother   . Colon cancer Neg Hx     Social History Social History  Substance Use Topics  . Smoking status: Former Smoker    Packs/day: 1.00    Years: 35.00    Types: Cigarettes    Quit date: 10/26/2002  . Smokeless tobacco: Never Used  . Alcohol use 3.0 oz/week    5 Glasses of wine per week     Comment: wine     Allergies   Actonel  [risedronate sodium]; Crestor [rosuvastatin calcium]; Fosamax [alendronate sodium]; Lovaza [omega-3-acid ethyl esters]; Nsaids; Other; Cephalexin; and Ciprofloxacin   Review of Systems Review of Systems  Constitutional: Negative for fever.  Respiratory: Negative for cough and shortness of breath.   Gastrointestinal: Positive for abdominal pain and diarrhea. Negative for vomiting.  Genitourinary: Negative for dysuria.  Musculoskeletal: Negative for back pain.  All other systems reviewed and are negative.    Physical Exam Updated Vital Signs BP 173/62 (BP Location: Left Arm)   Pulse 88   Temp 97.7 F (36.5 C) (Oral)   Resp 15   Ht 5\' 1"  (1.549 m)   Wt 118 lb (53.5 kg)   SpO2 100%   BMI 22.30 kg/m  Physical Exam  CONSTITUTIONAL: Well developed/well nourished HEAD: Normocephalic/atraumatic EYES: EOMI/PERRL ENMT: Mucous membranes moist NECK: supple no meningeal signs SPINE/BACK:entire spine nontender CV: S1/S2 noted, no murmurs/rubs/gallops noted LUNGS: Lungs are clear to auscultation bilaterally, no apparent distress ABDOMEN: soft, nontender, no rebound or guarding, bowel sounds noted throughout abdomen, well healing incisions noted, no drainage, no significant erythema, no focal tenderness  GU:no cva tenderness NEURO: Pt is awake/alert/appropriate, moves all extremitiesx4.  No facial droop.   EXTREMITIES: pulses normal/equal, full ROM SKIN: warm, color normal PSYCH: no abnormalities of mood noted, alert and oriented to situation  ED Treatments / Results  DIAGNOSTIC STUDIES: Oxygen Saturation is 100% on RA, normal by my interpretation.    COORDINATION OF CARE: 1:16 AM Discussed treatment plan with pt at bedside and pt agreed to plan.  Labs (all labs ordered are listed, but only abnormal results are displayed) Labs Reviewed  COMPREHENSIVE METABOLIC PANEL - Abnormal; Notable for the following:       Result Value   Glucose, Bld 108 (*)    Total Protein 5.9 (*)     Albumin 3.2 (*)    AST 71 (*)    ALT 77 (*)    All other components within normal limits  CBC - Abnormal; Notable for the following:    RBC 3.67 (*)    Hemoglobin 11.6 (*)    HCT 34.9 (*)    All other components within normal limits  LIPASE, BLOOD    EKG  EKG Interpretation None       Radiology No results found.  Procedures Procedures (including critical care time)  Medications Ordered in ED Medications  sodium chloride 0.9 % bolus 1,000 mL (1,000 mLs Intravenous New Bag/Given 09/13/16 0237)  acetaminophen (TYLENOL) tablet 650 mg (650 mg Oral Given 09/13/16 0237)     Initial Impression / Assessment and Plan / ED Course  I have reviewed the triage vital signs and the nursing notes.  Pertinent labs  results that were available during my care of the patient were reviewed by me and considered in my medical decision making (see chart for details).  Clinical Course     Pt well appearing No recurrent abd pain Labs reassuring No diarrhea here, unable to capture stool sample Pain controlled with APAP only She is not toxic She is not septic appearing I feel she is appropriate for d/c home We discussed strict ER return precautions  Final Clinical Impressions(s) / ED Diagnoses   Final diagnoses:  Generalized abdominal pain  Diarrhea, unspecified type    New Prescriptions New Prescriptions   No medications on file    I personally performed the services described in this documentation, which was scribed in my presence. The recorded information has been reviewed and is accurate.         Zadie Rhineonald Icela Glymph, MD 09/13/16 [phone]

## 2016-09-13 NOTE — Discharge Instructions (Signed)
°  SEEK IMMEDIATE MEDICAL ATTENTION IF: °The pain does not go away or becomes severe, particularly over the next 8-12 hours.  °A temperature above 100.4F develops.  °Repeated vomiting occurs (multiple episodes).  ° °Blood is being passed in stools or vomit (bright red or black tarry stools).  °Return also if you develop chest pain, difficulty breathing, dizziness or fainting, or become confused, poorly responsive, or inconsolable. ° °

## 2016-09-13 NOTE — ED Notes (Signed)
MD at bedside. 

## 2016-09-28 ENCOUNTER — Encounter: Payer: Self-pay | Admitting: Family Medicine

## 2016-09-28 ENCOUNTER — Ambulatory Visit (INDEPENDENT_AMBULATORY_CARE_PROVIDER_SITE_OTHER): Payer: Medicare Other | Admitting: Family Medicine

## 2016-09-28 VITALS — BP 123/64 | HR 72 | Temp 97.6°F | Ht 61.0 in | Wt 120.0 lb

## 2016-09-28 DIAGNOSIS — E559 Vitamin D deficiency, unspecified: Secondary | ICD-10-CM

## 2016-09-28 DIAGNOSIS — I700 Atherosclerosis of aorta: Secondary | ICD-10-CM | POA: Diagnosis not present

## 2016-09-28 DIAGNOSIS — E7800 Pure hypercholesterolemia, unspecified: Secondary | ICD-10-CM | POA: Diagnosis not present

## 2016-09-28 DIAGNOSIS — Z9889 Other specified postprocedural states: Secondary | ICD-10-CM | POA: Diagnosis not present

## 2016-09-28 DIAGNOSIS — Z8719 Personal history of other diseases of the digestive system: Secondary | ICD-10-CM | POA: Diagnosis not present

## 2016-09-28 DIAGNOSIS — I10 Essential (primary) hypertension: Secondary | ICD-10-CM | POA: Diagnosis not present

## 2016-09-28 NOTE — Patient Instructions (Addendum)
Medicare Annual Wellness Visit  [facility] and the medical providers at [facility] strive to bring you the best medical care.  In doing so we not only want to address your current medical conditions and concerns but also to detect new conditions early and prevent illness, disease and health-related problems.    Medicare offers a yearly Wellness Visit which allows our clinical staff to assess your need for preventative services including immunizations, lifestyle education, counseling to decrease risk of preventable diseases and screening for fall risk and other medical concerns.    This visit is provided free of charge (no copay) for all Medicare recipients. The clinical pharmacists at [facility] have begun to conduct these Wellness Visits which will also include a thorough review of all your medications.    As you primary medical provider recommend that you make an appointment for your Annual Wellness Visit if you have not done so already this year.  You may set up this appointment before you leave today or you may call back [phone] and schedule an appointment.  Please make sure when you call that you mention that you are scheduling your Annual Wellness Visit with the clinical pharmacist so that the appointment may be made for the proper length of time.     Continue current medications. Continue good therapeutic lifestyle changes which include good diet and exercise. Fall precautions discussed with patient. If an FOBT was given today- please return it to our front desk. If you are over 75 years old - you may need Prevnar 13 or the adult Pneumonia vaccine.  **Flu shots are available--- please call and schedule a FLU-CLINIC appointment**  After your visit with us today you will receive a survey in the mail or online from [employer] regarding your care with us. Please take a moment to fill this out. Your feedback is very  important to us as you can help us better understand your patient needs as well as improve your experience and satisfaction. WE CARE ABOUT YOU!!!   The patient is okay to do anything with her arms or legs but should refrain from any heavy weight lifting or abdominal exercises at least for another couple of months from the surgery. If she has any questions about this she should talk to the surgeon. This winter she should drink plenty of fluids and stay well hydrated. She is currently not taking anything for her cholesterol and did not tolerate Crestor. She has been given a prescription for Zetia but did not take it because of her recent hernia repair.

## 2016-09-28 NOTE — Progress Notes (Signed)
7

## 2016-09-28 NOTE — Progress Notes (Signed)
Subjective:    Patient ID: Dorothy Pitts, female    DOB: 12-Oct-1941, 75 y.o.   MRN: [id number]  HPI Pt here for follow up and management of chronic medical problems which includes hyperlipidemia and hypertension. She is taking medications regularly.The patient has recently had repair of an incisional hernia. She did develop a few complications following this repair and was back in the hospital for a short period time but is now out and feeling better. She has a history of thoracic aortic atherosclerosis hypertension hypokalemia and hyperlipidemia. She also has diverticulosis. She has no complaints today is requesting no refills. She is due to get her mammogram now but has an appointment in January. She will get lab work today. She does not take the flu shot. The patient is positive and alert. She denies any chest pain pressure tightness or palpitations. She denies any trouble with shortness of breath. She is passing her water well without any symptoms. Following the surgery she did have to go back in with several days worth of loose bowel movements and got some IV fluids and this seems to have corrected the problem although she still has an occasional loose bowel movement. She denies any heartburn indigestion nausea vomiting or blood in the stool. She made her last visit to see the surgeon he was please with everything with repairing the ventral hernia pending any further problems that she might have she would go back to see him.    Patient Active Problem List   Diagnosis Date Noted  . Ventral hernia 09/04/2016  . Pre-diabetes 11/28/2015  . Hyperlipidemia 08/28/2015  . Fall at home 06/24/2015  . Thoracic aortic atherosclerosis ([place]) 04/15/2015  . Osteoporosis 11/07/2014  . Hypokalemia 10/08/2013  . Anemia 10/08/2013  . Diverticulosis of colon (without mention of hemorrhage)   . Hypertension   . IBS (irritable bowel syndrome)    Outpatient Encounter Prescriptions as of 09/28/2016    Medication Sig  . acetaminophen (TYLENOL) 500 MG tablet Take 500 mg by mouth every 6 (six) hours as needed for mild pain.  . Ascorbic Acid (VITAMIN C) 1000 MG tablet Take 1,000 mg by mouth daily. Reported on 11/21/2015  . calcium carbonate (OSCAL) 1500 (600 Ca) MG TABS tablet Take 600 mg of elemental calcium by mouth daily.  . Cholecalciferol (VITAMIN D3) 2000 UNITS TABS Take 1 tablet by mouth daily.  . enalapril (VASOTEC) 5 MG tablet TAKE 1 TABLET BY MOUTH EVERY DAY AS DIRECTED (Patient taking differently: TAKE 1 TABLET BY MOUTH EVERY DAY)  . fluticasone (FLONASE) 50 MCG/ACT nasal spray Place 2 sprays into both nostrils daily. (Patient taking differently: Place 2 sprays into both nostrils at bedtime. )  . hyoscyamine (LEVSIN, ANASPAZ) 0.125 MG tablet Take 1 tablet (0.125 mg total) by mouth every 4 (four) hours as needed.  . Multiple Vitamin (MULTIVITAMIN) tablet Take 1 tablet by mouth daily.  . ondansetron (ZOFRAN-ODT) 4 MG disintegrating tablet Take 1 tablet (4 mg total) by mouth every 6 (six) hours as needed for nausea.  . raloxifene (EVISTA) 60 MG tablet TAKE 1 TABLET BY MOUTH ONCE A DAY  . spironolactone (ALDACTONE) 25 MG tablet TAKE 2 TABLETS BY MOUTH EVERY MORNING (Patient taking differently: TAKE 1 TABLET (25 MG) BY MOUTH EVERY MORNING)  . zolpidem (AMBIEN) 5 MG tablet TAKE 1 TABLET BY MOUTH AT BEDTIME  . [DISCONTINUED] oxyCODONE (OXY IR/ROXICODONE) 5 MG immediate release tablet Take 1 tablet (5 mg total) by mouth every 6 (six) hours as needed for  moderate pain.  [name] Kitchen ezetimibe (ZETIA) 10 MG tablet Take 1 tablet (10 mg total) by mouth daily. (Patient not taking: Reported on 09/28/2016)   No facility-administered encounter medications on file as of 09/28/2016.       Review of Systems  Constitutional: Negative.   HENT: Negative.   Eyes: Negative.   Respiratory: Negative.   Cardiovascular: Negative.   Gastrointestinal: Negative.   Endocrine: Negative.   Genitourinary: Negative.    Musculoskeletal: Negative.   Skin: Negative.   Allergic/Immunologic: Negative.   Neurological: Negative.   Hematological: Negative.   Psychiatric/Behavioral: Negative.        Objective:   Physical Exam  Constitutional: She is oriented to person, place, and time. She appears well-developed and well-nourished. No distress.  Pleasant and alert.  HENT:  Head: Normocephalic and atraumatic.  Right Ear: External ear normal.  Left Ear: External ear normal.  Nose: Nose normal.  Mouth/Throat: Oropharynx is clear and moist.  Eyes: Conjunctivae and EOM are normal. Pupils are equal, round, and reactive to light. Right eye exhibits no discharge. Left eye exhibits no discharge. No scleral icterus.  Neck: Normal range of motion. Neck supple. No thyromegaly present.  No bruits thyromegaly or anterior cervical adenopathy  Cardiovascular: Normal rate, regular rhythm, normal heart sounds and intact distal pulses.   No murmur heard. Heart had a regular rate and rhythm at 72/m  Pulmonary/Chest: Effort normal and breath sounds normal. No respiratory distress. She has no wheezes. She has no rales. She exhibits no tenderness.  Abdominal: Soft. Bowel sounds are normal. She exhibits no mass. There is tenderness. There is no rebound and no guarding.  Generalized slight abdominal tenderness secondary to recent repair of ventral hernia that was done laparoscopically.  Musculoskeletal: Normal range of motion. She exhibits no edema.  Lymphadenopathy:    She has no cervical adenopathy.  Neurological: She is alert and oriented to person, place, and time. She has normal reflexes. No cranial nerve deficit.  Skin: Skin is warm and dry. No rash noted.  Psychiatric: She has a normal mood and affect. Her behavior is normal. Judgment and thought content normal.  Nursing note and vitals reviewed.  BP 123/64 (BP Location: Left Arm)   Pulse 72   Temp 97.6 F (36.4 C) (Oral)   Ht _0  (1.549 m)   Wt 120 lb (54.4 kg)    BMI 22.67 kg/m         Assessment & Plan:  1. Pure hypercholesterolemia -Continue with aggressive therapeutic lifestyle changes pending results of lab work. The patient has been statin intolerant in the past. - CBC with Differential/Platelet - Lipid panel  2. Essential hypertension -The blood pressure is good today and she will continue with current treatment - BMP8+EGFR - CBC with Differential/Platelet - Hepatic function panel  3. Vitamin D deficiency -Continue with current treatment pending results of lab work - CBC with Differential/Platelet - VITAMIN D 25 Hydroxy (Vit-D Deficiency, Fractures)  4. Thoracic aortic atherosclerosis ([place]) -Continue with aggressive therapeutic lifestyle changes - CBC with Differential/Platelet - Lipid panel  5. Status post repair of recurrent ventral hernia -Follow-up with surgery only as needed now.  Patient Instructions                       Medicare Annual Wellness Visit  [facility] and the medical providers at [facility] strive to bring you the best medical care.  In doing so we not only want to address your  current medical conditions and concerns but also to detect new conditions early and prevent illness, disease and health-related problems.    Medicare offers a yearly Wellness Visit which allows our clinical staff to assess your need for preventative services including immunizations, lifestyle education, counseling to decrease risk of preventable diseases and screening for fall risk and other medical concerns.    This visit is provided free of charge (no copay) for all Medicare recipients. The clinical pharmacists at [facility] have begun to conduct these Wellness Visits which will also include a thorough review of all your medications.    As you primary medical provider recommend that you make an appointment for your Annual Wellness Visit if you have not done so already this year.  You  may set up this appointment before you leave today or you may call back ([phone]) and schedule an appointment.  Please make sure when you call that you mention that you are scheduling your Annual Wellness Visit with the clinical pharmacist so that the appointment may be made for the proper length of time.     Continue current medications. Continue good therapeutic lifestyle changes which include good diet and exercise. Fall precautions discussed with patient. If an FOBT was given today- please return it to our front desk. If you are over 41 years old - you may need Prevnar 74 or the adult Pneumonia vaccine.  **Flu shots are available--- please call and schedule a FLU-CLINIC appointment**  After your visit with [place] today you will receive a survey in the mail or online from [employer] regarding your care with [place]. Please take a moment to fill this out. Your feedback is very important to [place] as you can help [place] better understand your patient needs as well as improve your experience and satisfaction. WE CARE ABOUT YOU!!!   The patient is okay to do anything with her arms or legs but should refrain from any heavy weight lifting or abdominal exercises at least for another couple of months from the surgery. If she has any questions about this she should talk to the surgeon. This winter she should drink plenty of fluids and stay well hydrated. She is currently not taking anything for her cholesterol and did not tolerate Crestor. She has been given a prescription for Zetia but did not take it because of her recent hernia repair.   Arrie Senate MD

## 2016-09-29 LAB — HEPATIC FUNCTION PANEL
ALBUMIN: 4.4 g/dL (ref 3.5–4.8)
ALT: 11 IU/L (ref 0–32)
AST: 14 IU/L (ref 0–40)
Alkaline Phosphatase: 52 IU/L (ref 39–117)
BILIRUBIN TOTAL: 0.3 mg/dL (ref 0.0–1.2)
Bilirubin, Direct: 0.09 mg/dL (ref 0.00–0.40)
Total Protein: 6.7 g/dL (ref 6.0–8.5)

## 2016-09-29 LAB — BMP8+EGFR
BUN/Creatinine Ratio: 19 (ref 12–28)
BUN: 14 mg/dL (ref 8–27)
CALCIUM: 9.6 mg/dL (ref 8.7–10.3)
CO2: 26 mmol/L (ref 18–29)
CREATININE: 0.74 mg/dL (ref 0.57–1.00)
Chloride: 97 mmol/L (ref 96–106)
GFR calc Af Amer: 92 mL/min/{1.73_m2} (ref >59)
GFR calc non Af Amer: 80 mL/min/{1.73_m2} (ref >59)
GLUCOSE: 104 mg/dL — AB (ref 65–99)
Potassium: 4.6 mmol/L (ref 3.5–5.2)
SODIUM: 138 mmol/L (ref 134–144)

## 2016-09-29 LAB — LIPID PANEL
CHOL/HDL RATIO: 4.6 ratio — AB (ref 0.0–4.4)
Cholesterol, Total: 242 mg/dL — ABNORMAL HIGH (ref 100–199)
HDL: 53 mg/dL (ref >39)
LDL CALC: 153 mg/dL — AB (ref 0–99)
TRIGLYCERIDES: 178 mg/dL — AB (ref 0–149)
VLDL Cholesterol Cal: 36 mg/dL (ref 5–40)

## 2016-09-29 LAB — CBC WITH DIFFERENTIAL/PLATELET
Basophils Absolute: 0 10*3/uL (ref 0.0–0.2)
Basos: 0 %
EOS (ABSOLUTE): 0.1 10*3/uL (ref 0.0–0.4)
EOS: 1 %
HEMATOCRIT: 36.6 % (ref 34.0–46.6)
Hemoglobin: 12.1 g/dL (ref 11.1–15.9)
IMMATURE GRANULOCYTES: 0 %
Immature Grans (Abs): 0 10*3/uL (ref 0.0–0.1)
Lymphocytes Absolute: 1.5 10*3/uL (ref 0.7–3.1)
Lymphs: 29 %
MCH: 30.6 pg (ref 26.6–33.0)
MCHC: 33.1 g/dL (ref 31.5–35.7)
MCV: 92 fL (ref 79–97)
MONOCYTES: 7 %
MONOS ABS: 0.4 10*3/uL (ref 0.1–0.9)
NEUTROS PCT: 63 %
Neutrophils Absolute: 3.3 10*3/uL (ref 1.4–7.0)
Platelets: 395 10*3/uL — ABNORMAL HIGH (ref 150–379)
RBC: 3.96 x10E6/uL (ref 3.77–5.28)
RDW: 13.1 % (ref 12.3–15.4)
WBC: 5.3 10*3/uL (ref 3.4–10.8)

## 2016-09-29 LAB — VITAMIN D 25 HYDROXY (VIT D DEFICIENCY, FRACTURES): Vit D, 25-Hydroxy: 33.6 ng/mL (ref 30.0–100.0)

## 2016-09-29 NOTE — Addendum Note (Signed)
Addended by: Tamera PuntWRAY, WENDY S on: 09/29/2016 04:35 PM   Modules accepted: Orders

## 2016-10-12 ENCOUNTER — Other Ambulatory Visit: Payer: Self-pay | Admitting: Family Medicine

## 2016-10-13 NOTE — Telephone Encounter (Signed)
Last filled 08/04/16, last seen 09/28/16. Call in

## 2016-10-23 ENCOUNTER — Ambulatory Visit (INDEPENDENT_AMBULATORY_CARE_PROVIDER_SITE_OTHER): Payer: Medicare Other | Admitting: Family Medicine

## 2016-10-23 ENCOUNTER — Encounter: Payer: Self-pay | Admitting: Family Medicine

## 2016-10-23 ENCOUNTER — Ambulatory Visit (INDEPENDENT_AMBULATORY_CARE_PROVIDER_SITE_OTHER): Payer: Medicare Other

## 2016-10-23 VITALS — BP 128/57 | HR 86 | Temp 97.9°F | Ht 61.0 in | Wt 120.0 lb

## 2016-10-23 DIAGNOSIS — R059 Cough, unspecified: Secondary | ICD-10-CM

## 2016-10-23 DIAGNOSIS — R05 Cough: Secondary | ICD-10-CM

## 2016-10-23 DIAGNOSIS — J069 Acute upper respiratory infection, unspecified: Secondary | ICD-10-CM | POA: Diagnosis not present

## 2016-10-23 DIAGNOSIS — J209 Acute bronchitis, unspecified: Secondary | ICD-10-CM | POA: Diagnosis not present

## 2016-10-23 LAB — CBC WITH DIFFERENTIAL/PLATELET
BASOS ABS: 0 10*3/uL (ref 0.0–0.2)
Basos: 0 %
EOS (ABSOLUTE): 0.1 10*3/uL (ref 0.0–0.4)
EOS: 2 %
HEMOGLOBIN: 11.8 g/dL (ref 11.1–15.9)
Hematocrit: 35.6 % (ref 34.0–46.6)
IMMATURE GRANULOCYTES: 0 %
Immature Grans (Abs): 0 10*3/uL (ref 0.0–0.1)
LYMPHS ABS: 0.9 10*3/uL (ref 0.7–3.1)
Lymphs: 22 %
MCH: 31 pg (ref 26.6–33.0)
MCHC: 33.1 g/dL (ref 31.5–35.7)
MCV: 93 fL (ref 79–97)
MONOCYTES: 10 %
Monocytes Absolute: 0.4 10*3/uL (ref 0.1–0.9)
NEUTROS PCT: 66 %
Neutrophils Absolute: 2.7 10*3/uL (ref 1.4–7.0)
Platelets: 306 10*3/uL (ref 150–379)
RBC: 3.81 x10E6/uL (ref 3.77–5.28)
RDW: 13.8 % (ref 12.3–15.4)
WBC: 4.1 10*3/uL (ref 3.4–10.8)

## 2016-10-23 MED ORDER — AZITHROMYCIN 250 MG PO TABS: ORAL_TABLET | ORAL | 0 refills | Status: DC

## 2016-10-23 NOTE — Progress Notes (Signed)
Subjective:    Patient ID: Dorothy Pitts, female    DOB: 26-May-1941, 75 y.o.   MRN: [id number]  HPI Patient here today for cough and congestion that started 2 days ago.She is also been running some fever had a sore throat and ear pain. She had loose stools one time this morning. She is not coughing up anything. She does not use an overhead fan.      Patient Active Problem List   Diagnosis Date Noted  . Ventral hernia 09/04/2016  . Pre-diabetes 11/28/2015  . Hyperlipidemia 08/28/2015  . Fall at home 06/24/2015  . Thoracic aortic atherosclerosis ([facility]) 04/15/2015  . Osteoporosis 11/07/2014  . Hypokalemia 10/08/2013  . Anemia 10/08/2013  . Diverticulosis of colon (without mention of hemorrhage)   . Hypertension   . IBS (irritable bowel syndrome)    Outpatient Encounter Prescriptions as of 10/23/2016  Medication Sig  . acetaminophen (TYLENOL) 500 MG tablet Take 500 mg by mouth every 6 (six) hours as needed for mild pain.  . Ascorbic Acid (VITAMIN C) 1000 MG tablet Take 1,000 mg by mouth daily. Reported on 11/21/2015  . calcium carbonate (OSCAL) 1500 (600 Ca) MG TABS tablet Take 600 mg of elemental calcium by mouth daily.  . Cholecalciferol (VITAMIN D3) 2000 UNITS TABS Take 1 tablet by mouth daily.  . enalapril (VASOTEC) 5 MG tablet TAKE 1 TABLET BY MOUTH EVERY DAY AS DIRECTED (Patient taking differently: TAKE 1 TABLET BY MOUTH EVERY DAY)  . fluticasone (FLONASE) 50 MCG/ACT nasal spray Place 2 sprays into both nostrils daily. (Patient taking differently: Place 2 sprays into both nostrils at bedtime. )  . hyoscyamine (LEVSIN, ANASPAZ) 0.125 MG tablet Take 1 tablet (0.125 mg total) by mouth every 4 (four) hours as needed.  . Multiple Vitamin (MULTIVITAMIN) tablet Take 1 tablet by mouth daily.  . raloxifene (EVISTA) 60 MG tablet TAKE 1 TABLET BY MOUTH ONCE A DAY  . spironolactone (ALDACTONE) 25 MG tablet TAKE 2 TABLETS BY MOUTH EVERY MORNING (Patient taking differently: TAKE 1 TABLET  (25 MG) BY MOUTH EVERY MORNING)  . zolpidem (AMBIEN) 5 MG tablet TAKE 1 TABLET BY MOUTH AT BEDTIME  . ezetimibe (ZETIA) 10 MG tablet Take 1 tablet (10 mg total) by mouth daily. (Patient not taking: Reported on 10/23/2016)  . [DISCONTINUED] ondansetron (ZOFRAN-ODT) 4 MG disintegrating tablet Take 1 tablet (4 mg total) by mouth every 6 (six) hours as needed for nausea.   No facility-administered encounter medications on file as of 10/23/2016.      Review of Systems  Constitutional: Negative.   HENT: Positive for congestion and ear pain.   Eyes: Negative.   Respiratory: Positive for cough and chest tightness (with cough).   Cardiovascular: Negative.   Gastrointestinal: Positive for diarrhea (x 1 today).  Endocrine: Negative.   Genitourinary: Negative.   Musculoskeletal: Negative.   Skin: Negative.   Allergic/Immunologic: Negative.   Neurological: Negative.   Hematological: Negative.   Psychiatric/Behavioral: Negative.        Objective:   Physical Exam  Constitutional: She is oriented to person, place, and time. She appears well-developed and well-nourished. No distress.  Pleasant and alert  HENT:  Head: Normocephalic and atraumatic.  Right Ear: External ear normal.  Left Ear: External ear normal.  Nose: Nose normal.  Mouth/Throat: Oropharynx is clear and moist. No oropharyngeal exudate.  Nasal congestion bilaterally  Eyes: Conjunctivae and EOM are normal. Pupils are equal, round, and reactive to light. Right eye exhibits no discharge. Left eye exhibits  no discharge. No scleral icterus.  Neck: Normal range of motion. Neck supple. No thyromegaly present.  No anterior cervical nodes  Cardiovascular: Normal rate and regular rhythm.   No murmur heard. Pulmonary/Chest: Effort normal and breath sounds normal. No respiratory distress. She has no wheezes. She has no rales.  Upper airway congestion with coughing, otherwise no wheezes or rales.  Musculoskeletal: Normal range of motion.  She exhibits no edema.  Lymphadenopathy:    She has no cervical adenopathy.  Neurological: She is alert and oriented to person, place, and time.  Skin: Skin is warm and dry. No rash noted.  Psychiatric: She has a normal mood and affect. Her behavior is normal. Judgment and thought content normal.  Nursing note and vitals reviewed.  BP (!) 128/57 (BP Location: Left Arm)   Pulse 86   Temp 97.9 F (36.6 C) (Oral)   Ht 5\' 1"  (1.549 m)   Wt 120 lb (54.4 kg)   BMI 22.67 kg/m         Assessment & Plan:  1. Cough -Take Mucinex maximum strength, blue and white in color, 1 twice daily with a large glass of water - CBC with Differential/Platelet - DG Chest 2 View; Future  2. Upper respiratory tract infection, unspecified type -Take antibiotic, Z-Pak as directed and until completed -Use nasal saline and Mucinex  3. Bronchitis with bronchospasm -Take antibiotic until completed -Drink plenty of fluids and stay well hydrated -Keep the house as cool as possible -Use cool mist humidification  Meds ordered this encounter  Medications  . azithromycin (ZITHROMAX) 250 MG tablet    Sig: 2 pills the first day then one daily for infection until completed    Dispense:  6 tablet    Refill:  0   Patient Instructions  Drink plenty of fluids and stay well hydrated Take Mucinex maximum strength, blue and white in color, 1 twice daily for cough and congestion with a large glass of water Continue to use nasal saline frequently during the day Use Flonase at night Use a cool mist humidifier the rest of the winter in your bedroom Keep the house as cool as possible Take antibiotic until completed  Nyra Capeson W. Moore MD

## 2016-10-23 NOTE — Patient Instructions (Signed)
Drink plenty of fluids and stay well hydrated Take Mucinex maximum strength, blue and white in color, 1 twice daily for cough and congestion with a large glass of water Continue to use nasal saline frequently during the day Use Flonase at night Use a cool mist humidifier the rest of the winter in your bedroom Keep the house as cool as possible Take antibiotic until completed

## 2016-11-05 ENCOUNTER — Telehealth: Payer: Self-pay

## 2016-11-05 NOTE — Telephone Encounter (Signed)
No better. still coughing, no fever Does she need to be seen or can we will call her in something else?

## 2016-11-05 NOTE — Telephone Encounter (Signed)
The patient is allergic to fluoroquinolones. She is just finished a Z-Pak. Have her take Septra DS 1 twice daily for 10 days with food for infection until completed and if she is not better once this is completed or if she gets worse she needs to come in and be seen Continue with Mucinex Continue with cool mist humidification

## 2016-11-05 NOTE — Telephone Encounter (Signed)
Seen 12/29- DWM

## 2016-11-06 MED ORDER — SULFAMETHOXAZOLE-TRIMETHOPRIM 800-160 MG PO TABS: 1.0000 | ORAL_TABLET | Freq: Two times a day (BID) | ORAL | 0 refills | Status: DC

## 2016-11-06 NOTE — Telephone Encounter (Signed)
Pt aware med sent in

## 2016-11-13 ENCOUNTER — Ambulatory Visit (INDEPENDENT_AMBULATORY_CARE_PROVIDER_SITE_OTHER): Payer: Medicare Other | Admitting: Physician Assistant

## 2016-11-13 VITALS — BP 113/65 | HR 74 | Temp 97.6°F | Ht 61.0 in | Wt 118.0 lb

## 2016-11-13 DIAGNOSIS — J0100 Acute maxillary sinusitis, unspecified: Secondary | ICD-10-CM

## 2016-11-13 DIAGNOSIS — J209 Acute bronchitis, unspecified: Secondary | ICD-10-CM

## 2016-11-13 MED ORDER — CLINDAMYCIN HCL 150 MG PO CAPS: 150.0000 mg | ORAL_CAPSULE | Freq: Three times a day (TID) | ORAL | 0 refills | Status: DC

## 2016-11-13 MED ORDER — HYDROCODONE-HOMATROPINE 5-1.5 MG/5ML PO SYRP: 5.0000 mL | ORAL_SOLUTION | Freq: Four times a day (QID) | ORAL | 0 refills | Status: DC | PRN

## 2016-11-13 NOTE — Patient Instructions (Signed)

## 2016-11-15 ENCOUNTER — Encounter: Payer: Self-pay | Admitting: Physician Assistant

## 2016-11-15 NOTE — Progress Notes (Signed)
BP 113/65   Pulse 74   Temp 97.6 F (36.4 C) (Oral)   Ht 5\' 1"  (1.549 m)   Wt 118 lb (53.5 kg)   BMI 22.30 kg/m    Subjective:    Patient ID: Dorothy Pitts, female    DOB: 12/15/1940, 76 y.o.   MRN: [id number]  HPI: Dorothy Pitts is a 76 y.o. female presenting on 11/13/2016 for Adenopathy; CHEST COLD; and Cough  Patient with several days of progressing upper respiratory and bronchial symptoms. Initially there was more upper respiratory congestion. This progressed to having significant cough that is productive throughout the day and severe at night. There is occasional wheezing after coughing. They will sometimes have slight dyspnea on exertion. It is productive mucus that is yellow in color. Denies any blood.  She has swollen node on anterior cervical area   Past Medical History:  Diagnosis Date  . Acute diverticulitis 10/07/2013   With microperforation.  . Bowel obstruction   . Cataract   . Diverticulosis of colon (without mention of hemorrhage)   . GERD (gastroesophageal reflux disease)   . Hemorrhoids   . HOH (hard of hearing)   . Hyperlipidemia   . Hypertension   . IBS (irritable bowel syndrome)   . Osteopenia   . Osteoporosis   . PONV (postoperative nausea and vomiting)   . Thoracic aortic atherosclerosis (HCC)   . UTI (lower urinary tract infection) 10/07/2013  . Vitamin D deficiency    Relevant past medical, surgical, family and social history reviewed and updated as indicated. Interim medical history since our last visit reviewed. Allergies and medications reviewed and updated. DATA REVIEWED: CHART IN EPIC  Social History   Social History  . Marital status: Widowed    Spouse name: N/A  . Number of children: 3  . Years of education: N/A   Occupational History  . retired Retired   Social History Main Topics  . Smoking status: Former Smoker    Packs/day: 1.00    Years: 35.00    Types: Cigarettes    Quit date: 10/26/2002  . Smokeless tobacco:  Never Used  . Alcohol use 3.0 oz/week    5 Glasses of wine per week     Comment: wine  . Drug use: No  . Sexual activity: No   Other Topics Concern  . Not on file   Social History Narrative  . No narrative on file    Past Surgical History:  Procedure Laterality Date  . ABDOMINAL HYSTERECTOMY    . APPENDECTOMY    . BILATERAL OOPHORECTOMY    . BREAST LUMPECTOMY Left   . CATARACT EXTRACTION    . COLON RESECTION N/A 12/01/2013   Procedure: HAND ASSISTED LAPAROSCOPIC PARTIAL COLECTOMY;  Surgeon: Dalia Heading, MD;  Location: [facility];  Service: General;  Laterality: N/A;  . EYE SURGERY    . HEMORRHOID SURGERY    . HERNIA REPAIR  09/04/2016   LAPAROSCOPIC VENTRAL INCISIONAL HERNIA REPAIR POSSIBLE OPEN (N/A)  . INCISIONAL HERNIA REPAIR N/A 09/04/2016   Procedure: LAPAROSCOPIC VENTRAL INCISIONAL HERNIA REPAIR POSSIBLE OPEN;  Surgeon: Ovidio Kin, MD;  Location: [facility] OR;  Service: General;  Laterality: N/A;  . INSERTION OF MESH N/A 09/04/2016   Procedure: INSERTION OF MESH;  Surgeon: Ovidio Kin, MD;  Location: [facility] OR;  Service: General;  Laterality: N/A;  . PARTIAL HYSTERECTOMY    . TONSILLECTOMY      Family History  Problem Relation Age of Onset  . Hypertension Mother   .  Heart attack Mother 81  . Parkinson's disease Father   . Heart disease Brother 45    bypass  . Obesity Brother   . Colon cancer Neg Hx     Review of Systems  Constitutional: Positive for chills and fatigue. Negative for activity change and appetite change.  HENT: Positive for congestion, postnasal drip and sore throat.   Eyes: Negative.   Respiratory: Positive for cough and wheezing.   Cardiovascular: Negative.  Negative for chest pain, palpitations and leg swelling.  Gastrointestinal: Negative.   Genitourinary: Negative.   Musculoskeletal: Negative.   Skin: Negative.   Neurological: Positive for headaches.    Allergies as of 11/13/2016      Reactions   Actonel [risedronate Sodium] Other (See  Comments)   Reaction=Heart burn   Crestor [rosuvastatin Calcium] Other (See Comments)   Severe myalgias   Fosamax [alendronate Sodium] Other (See Comments)   Reaction=Heart burn   Lovaza [omega-3-acid Ethyl Esters] Other (See Comments)   Reaction=skin bruise    Nsaids Swelling   Reaction=Swelling of face   Other    General anesthesia-nausea   Cephalexin Itching, Rash   Ciprofloxacin Itching, Rash      Medication List       Accurate as of 11/13/16 11:59 PM. Always use your most recent med list.          acetaminophen 500 MG tablet Commonly known as:  TYLENOL Take 500 mg by mouth every 6 (six) hours as needed for mild pain.   calcium carbonate 1500 (600 Ca) MG Tabs tablet Commonly known as:  OSCAL Take 600 mg of elemental calcium by mouth daily.   clindamycin 150 MG capsule Commonly known as:  CLEOCIN Take 1 capsule (150 mg total) by mouth 3 (three) times daily.   enalapril 5 MG tablet Commonly known as:  VASOTEC TAKE 1 TABLET BY MOUTH EVERY DAY AS DIRECTED   ezetimibe 10 MG tablet Commonly known as:  ZETIA Take 1 tablet (10 mg total) by mouth daily.   fluticasone 50 MCG/ACT nasal spray Commonly known as:  FLONASE Place 2 sprays into both nostrils daily.   HYDROcodone-homatropine 5-1.5 MG/5ML syrup Commonly known as:  HYCODAN Take 5 mLs by mouth every 6 (six) hours as needed for cough.   hyoscyamine 0.125 MG tablet Commonly known as:  LEVSIN, ANASPAZ Take 1 tablet (0.125 mg total) by mouth every 4 (four) hours as needed.   multivitamin tablet Take 1 tablet by mouth daily.   raloxifene 60 MG tablet Commonly known as:  EVISTA TAKE 1 TABLET BY MOUTH ONCE A DAY   spironolactone 25 MG tablet Commonly known as:  ALDACTONE TAKE 2 TABLETS BY MOUTH EVERY MORNING   sulfamethoxazole-trimethoprim 800-160 MG tablet Commonly known as:  BACTRIM DS Take 1 tablet by mouth 2 (two) times daily.   vitamin C 1000 MG tablet Take 1,000 mg by mouth daily. Reported on  11/21/2015   Vitamin D3 2000 units Tabs Take 1 tablet by mouth daily.   zolpidem 5 MG tablet Commonly known as:  AMBIEN TAKE 1 TABLET BY MOUTH AT BEDTIME          Objective:    BP 113/65   Pulse 74   Temp 97.6 F (36.4 C) (Oral)   Ht 5\' 1"  (1.549 m)   Wt 118 lb (53.5 kg)   BMI 22.30 kg/m   Allergies  Allergen Reactions  . Actonel [Risedronate Sodium] Other (See Comments)    Reaction=Heart burn  . Crestor [Rosuvastatin Calcium] Other (  See Comments)    Severe myalgias   . Fosamax [Alendronate Sodium] Other (See Comments)    Reaction=Heart burn  . Lovaza [Omega-3-Acid Ethyl Esters] Other (See Comments)    Reaction=skin bruise   . Nsaids Swelling    Reaction=Swelling of face  . Other     General anesthesia-nausea  . Cephalexin Itching and Rash  . Ciprofloxacin Itching and Rash    Wt Readings from Last 3 Encounters:  11/13/16 118 lb (53.5 kg)  10/23/16 120 lb (54.4 kg)  09/28/16 120 lb (54.4 kg)    Physical Exam  Constitutional: She is oriented to person, place, and time. She appears well-developed and well-nourished.  HENT:  Head: Normocephalic and atraumatic.  Right Ear: There is drainage and tenderness.  Left Ear: There is drainage and tenderness.  Nose: Mucosal edema and rhinorrhea present. Right sinus exhibits maxillary sinus tenderness and frontal sinus tenderness. Left sinus exhibits maxillary sinus tenderness and frontal sinus tenderness.  Mouth/Throat: Oropharyngeal exudate and posterior oropharyngeal erythema present.  Eyes: Conjunctivae and EOM are normal. Pupils are equal, round, and reactive to light.  Neck: Trachea normal. Neck supple. Normal carotid pulses present. Carotid bruit is not present. Decreased range of motion present. No thyroid mass and no thyromegaly present.    Anterior cervical node on left side  Cardiovascular: Normal rate, regular rhythm, normal heart sounds and intact distal pulses.   Pulmonary/Chest: Effort normal. She has  wheezes in the right upper field and the left upper field.  Abdominal: Soft. Bowel sounds are normal.  Neurological: She is alert and oriented to person, place, and time. She has normal reflexes.  Skin: Skin is warm and dry. No rash noted.  Psychiatric: She has a normal mood and affect. Her behavior is normal. Judgment and thought content normal.  Nursing note and vitals reviewed.       Assessment & Plan:   1. Acute non-recurrent maxillary sinusitis - clindamycin (CLEOCIN) 150 MG capsule; Take 1 capsule (150 mg total) by mouth 3 (three) times daily.  Dispense: 30 capsule; Refill: 0  2. Acute bronchitis, unspecified organism - HYDROcodone-homatropine (HYCODAN) 5-1.5 MG/5ML syrup; Take 5 mLs by mouth every 6 (six) hours as needed for cough.  Dispense: 120 mL; Refill: 0 - clindamycin (CLEOCIN) 150 MG capsule; Take 1 capsule (150 mg total) by mouth 3 (three) times daily.  Dispense: 30 capsule; Refill: 0   Continue all other maintenance medications as listed above.  Follow up plan: Follow-up as needed or worsening of symptoms. Call office for any issues.  No orders of the defined types were placed in this encounter.  Educational handout given for bronchitis  Remus LofflerAngel S. Miosotis Wetsel PA-C [facility] [address]  [place], [place] [id number] [phone]   11/15/2016, 10:41 PM

## 2016-11-23 ENCOUNTER — Telehealth: Payer: Self-pay | Admitting: Family Medicine

## 2016-11-23 ENCOUNTER — Ambulatory Visit: Payer: Medicare Other | Admitting: Pharmacist

## 2016-11-23 NOTE — Telephone Encounter (Signed)
Called in 3 tabs of each

## 2016-11-28 ENCOUNTER — Other Ambulatory Visit: Payer: Self-pay | Admitting: Family Medicine

## 2017-01-04 ENCOUNTER — Telehealth: Payer: Self-pay | Admitting: Family Medicine

## 2017-01-04 NOTE — Telephone Encounter (Signed)
What type of referral do you need? Allergist for allergy testing thinks she may be allergic to her cat  Have you been seen at our office for this problem? [facility] (If no, schedule them an appointment.  They will need to be seen before a referral can be done.)  Is there a particular doctor or location that you prefer? She does not know any   Patient notified that referrals can take up to a week or longer to process. If they haven't heard anything within a week they should call back and speak with the referral department.

## 2017-01-04 NOTE — Telephone Encounter (Signed)
Please refer to allergist that we use routinely. Dr. Laurette SchimkeEric Kozlow would be a good person to send her to.

## 2017-01-04 NOTE — Telephone Encounter (Signed)
Routed to Dr. Christell ConstantMoore and Berton LanJamie Bullins to advise regarding referral to allergist.

## 2017-01-05 ENCOUNTER — Ambulatory Visit (INDEPENDENT_AMBULATORY_CARE_PROVIDER_SITE_OTHER): Payer: Medicare Other | Admitting: Family Medicine

## 2017-01-05 ENCOUNTER — Encounter: Payer: Self-pay | Admitting: Family Medicine

## 2017-01-05 VITALS — BP 136/71 | HR 79 | Temp 98.6°F | Ht 61.0 in | Wt 121.0 lb

## 2017-01-05 DIAGNOSIS — J329 Chronic sinusitis, unspecified: Secondary | ICD-10-CM

## 2017-01-05 DIAGNOSIS — J40 Bronchitis, not specified as acute or chronic: Secondary | ICD-10-CM

## 2017-01-05 MED ORDER — FEXOFENADINE-PSEUDOEPHED ER 180-240 MG PO TB24: 1.0000 | ORAL_TABLET | Freq: Every evening | ORAL | 11 refills | Status: DC

## 2017-01-05 MED ORDER — AMOXICILLIN-POT CLAVULANATE 875-125 MG PO TABS: 1.0000 | ORAL_TABLET | Freq: Two times a day (BID) | ORAL | 0 refills | Status: DC

## 2017-01-05 NOTE — Progress Notes (Signed)
Subjective:  Patient ID: Dorothy Pitts, female    DOB: 1940-12-27  Age: 76 y.o. MRN: [id number]  CC: Cough and Sinusitis (runny nose, bilateral ear pain sinus congestion and drainage; taking Claritin, Flonase, Delsym, Ayr gel, Saline and Tylenol)   HPI Dorothy CootsJudith M Liby presents for Symptoms include congestion, facial pain, nasal congestion, productive cough, post nasal drip and sinus pressure. There is no fever, chills, or sweats. Right frontal HA intermittentlyOnset of symptoms was a few days ago, gradually worsening since that time.   History [name] has a past medical history of Acute diverticulitis (10/07/2013); Bowel obstruction; Cataract; Diverticulosis of colon (without mention of hemorrhage); GERD (gastroesophageal reflux disease); Hemorrhoids; HOH (hard of hearing); Hyperlipidemia; Hypertension; IBS (irritable bowel syndrome); Osteopenia; Osteoporosis; PONV (postoperative nausea and vomiting); Thoracic aortic atherosclerosis (HCC); UTI (lower urinary tract infection) (10/07/2013); and Vitamin D deficiency.   She has a past surgical history that includes Appendectomy; Partial hysterectomy; Bilateral oophorectomy; Hemorrhoid surgery; Breast lumpectomy (Left); Tonsillectomy; Cataract extraction; Colon resection (N/A, 12/01/2013); Eye surgery; Abdominal hysterectomy; Hernia repair (09/04/2016); Incisional hernia repair (N/A, 09/04/2016); and Insertion of mesh (N/A, 09/04/2016).   Her family history includes Heart attack (age of onset: 1467) in her mother; Heart disease (age of onset: 9245) in her brother; Hypertension in her mother; Obesity in her brother; Parkinson's disease in her father.She reports that she quit smoking about 14 years ago. Her smoking use included Cigarettes. She has a 35.00 pack-year smoking history. She has never used smokeless tobacco. She reports that she drinks about 3.0 oz of alcohol per week . She reports that she does not use drugs.  Current Outpatient Prescriptions  on File Prior to Visit  Medication Sig Dispense Refill  . acetaminophen (TYLENOL) 500 MG tablet Take 500 mg by mouth every 6 (six) hours as needed for mild pain.    . Ascorbic Acid (VITAMIN C) 1000 MG tablet Take 1,000 mg by mouth daily. Reported on 11/21/2015    . calcium carbonate (OSCAL) 1500 (600 Ca) MG TABS tablet Take 600 mg of elemental calcium by mouth daily.    . Cholecalciferol (VITAMIN D3) 2000 UNITS TABS Take 1 tablet by mouth daily.    . enalapril (VASOTEC) 5 MG tablet TAKE 1 TABLET EVERY DAY 90 tablet 1  . fluticasone (FLONASE) 50 MCG/ACT nasal spray Place 2 sprays into both nostrils daily. (Patient taking differently: Place 2 sprays into both nostrils at bedtime. ) 16 g 6  . Multiple Vitamin (MULTIVITAMIN) tablet Take 1 tablet by mouth daily.    . raloxifene (EVISTA) 60 MG tablet TAKE 1 TABLET EVERY DAY 90 tablet 0  . spironolactone (ALDACTONE) 25 MG tablet TAKE 2 TABLETS BY MOUTH EVERY MORNING (Patient taking differently: TAKE 1 TABLET (25 MG) BY MOUTH EVERY MORNING) 180 tablet 0  . zolpidem (AMBIEN) 5 MG tablet TAKE 1 TABLET BY MOUTH AT BEDTIME 90 tablet 0  . ezetimibe (ZETIA) 10 MG tablet Take 1 tablet (10 mg total) by mouth daily. 30 tablet 2   No current facility-administered medications on file prior to visit.     ROS Review of Systems  Constitutional: Negative for activity change, appetite change, chills and fever.  HENT: Positive for congestion, postnasal drip, rhinorrhea and sinus pressure. Negative for ear discharge, ear pain, hearing loss, nosebleeds, sneezing and trouble swallowing.   Respiratory: Negative for chest tightness and shortness of breath.   Cardiovascular: Negative for chest pain and palpitations.  Skin: Negative for rash.    Objective:  BP 136/71  Pulse 79   Temp 98.6 F (37 C) (Oral)   Ht 5\' 1"  (1.549 m)   Wt 121 lb (54.9 kg)   BMI 22.86 kg/m   Physical Exam  Constitutional: She appears well-developed and well-nourished.  HENT:  Head:  Normocephalic and atraumatic.  Right Ear: Tympanic membrane and external ear normal. No decreased hearing is noted.  Left Ear: Tympanic membrane and external ear normal. No decreased hearing is noted.  Nose: Mucosal edema present. Right sinus exhibits no frontal sinus tenderness. Left sinus exhibits no frontal sinus tenderness.  Mouth/Throat: No oropharyngeal exudate or posterior oropharyngeal erythema.  Neck: No Brudzinski's sign noted.  Pulmonary/Chest: Breath sounds normal. No respiratory distress.  Lymphadenopathy:       Head (right side): No preauricular adenopathy present.       Head (left side): No preauricular adenopathy present.       Right cervical: No superficial cervical adenopathy present.      Left cervical: No superficial cervical adenopathy present.    Assessment & Plan:   Teran was seen today for cough and sinusitis.  Diagnoses and all orders for this visit:  Sinobronchitis  Other orders -     amoxicillin-clavulanate (AUGMENTIN) 875-125 MG tablet; Take 1 tablet by mouth 2 (two) times daily. Take all of this medication -     fexofenadine-pseudoephedrine (ALLEGRA-D 24) 180-240 MG 24 hr tablet; Take 1 tablet by mouth every evening. For allergy and congestion   I have discontinued Ms. Denzer's hyoscyamine, sulfamethoxazole-trimethoprim, HYDROcodone-homatropine, and clindamycin. I am also having her start on amoxicillin-clavulanate and fexofenadine-pseudoephedrine. Additionally, I am having her maintain her multivitamin, vitamin C, Vitamin D3, fluticasone, ezetimibe, spironolactone, calcium carbonate, acetaminophen, zolpidem, raloxifene, and enalapril.  Meds ordered this encounter  Medications  . amoxicillin-clavulanate (AUGMENTIN) 875-125 MG tablet    Sig: Take 1 tablet by mouth 2 (two) times daily. Take all of this medication    Dispense:  20 tablet    Refill:  0  . fexofenadine-pseudoephedrine (ALLEGRA-D 24) 180-240 MG 24 hr tablet    Sig: Take 1 tablet by mouth  every evening. For allergy and congestion    Dispense:  30 tablet    Refill:  11     Follow-up: Return if symptoms worsen or fail to improve.  Mechele Claude, M.D.

## 2017-01-06 ENCOUNTER — Encounter: Payer: Self-pay | Admitting: Family Medicine

## 2017-01-06 ENCOUNTER — Ambulatory Visit (INDEPENDENT_AMBULATORY_CARE_PROVIDER_SITE_OTHER): Payer: Medicare Other | Admitting: Family Medicine

## 2017-01-06 VITALS — BP 122/66 | HR 84 | Temp 99.1°F | Ht 61.0 in | Wt 121.0 lb

## 2017-01-06 DIAGNOSIS — J329 Chronic sinusitis, unspecified: Secondary | ICD-10-CM | POA: Diagnosis not present

## 2017-01-06 DIAGNOSIS — R0981 Nasal congestion: Secondary | ICD-10-CM | POA: Diagnosis not present

## 2017-01-06 DIAGNOSIS — J40 Bronchitis, not specified as acute or chronic: Secondary | ICD-10-CM | POA: Diagnosis not present

## 2017-01-06 NOTE — Patient Instructions (Signed)
Continue with Allegra-D Add Mucinex maximum strength, blue and white in color, 1 twice daily with a large glass of water Continue and complete the Augmentin which is the antibiotic Drink plenty of fluids and stay well hydrated Continue to use nasal saline and use the cool mist humidifier in the bedroom at nighttime Run an add "For free cat To good home!"

## 2017-01-06 NOTE — Telephone Encounter (Signed)
This was done at [facility] today

## 2017-01-06 NOTE — Progress Notes (Signed)
Subjective:    Patient ID: Dorothy Pitts, female    DOB: 04-Sep-1941, 76 y.o.   MRN: [id number]  HPI Patient here today for follow up on sinus infection and to discuss a allergist appt.Patient was seen last night for sinus infection and was started on Augmentin and Allegra-D. She is requesting a referral to an allergist that she's had more problems with her allergy related sinus issues. She is currently using Flonase and Allegra-D. The patient just started with this cough and congestion and rhinitis since before Christmas when she took in a stray cat. He is a Chief Strategy Officer. She doesn't want to give up the cat. We will do a referral to an allergy specialist for further evaluation. She will keep on with her current medications until completed.    Patient Active Problem List   Diagnosis Date Noted  . Ventral hernia 09/04/2016  . Pre-diabetes 11/28/2015  . Hyperlipidemia 08/28/2015  . Fall at home 06/24/2015  . Thoracic aortic atherosclerosis (HCC) 04/15/2015  . Osteoporosis 11/07/2014  . Hypokalemia 10/08/2013  . Anemia 10/08/2013  . Diverticulosis of colon (without mention of hemorrhage)   . Hypertension   . IBS (irritable bowel syndrome)    Outpatient Encounter Prescriptions as of 01/06/2017  Medication Sig  . acetaminophen (TYLENOL) 500 MG tablet Take 500 mg by mouth every 6 (six) hours as needed for mild pain.  [name] Kitchen amoxicillin-clavulanate (AUGMENTIN) 875-125 MG tablet Take 1 tablet by mouth 2 (two) times daily. Take all of this medication  . Ascorbic Acid (VITAMIN C) 1000 MG tablet Take 1,000 mg by mouth daily. Reported on 11/21/2015  . calcium carbonate (OSCAL) 1500 (600 Ca) MG TABS tablet Take 600 mg of elemental calcium by mouth daily.  . Cholecalciferol (VITAMIN D3) 2000 UNITS TABS Take 1 tablet by mouth daily.  . enalapril (VASOTEC) 5 MG tablet TAKE 1 TABLET EVERY DAY  . ezetimibe (ZETIA) 10 MG tablet Take 1 tablet (10 mg total) by mouth daily.  . fexofenadine-pseudoephedrine  (ALLEGRA-D 24) 180-240 MG 24 hr tablet Take 1 tablet by mouth every evening. For allergy and congestion  . fluticasone (FLONASE) 50 MCG/ACT nasal spray Place 2 sprays into both nostrils daily. (Patient taking differently: Place 2 sprays into both nostrils at bedtime. )  . Multiple Vitamin (MULTIVITAMIN) tablet Take 1 tablet by mouth daily.  . raloxifene (EVISTA) 60 MG tablet TAKE 1 TABLET EVERY DAY  . spironolactone (ALDACTONE) 25 MG tablet TAKE 2 TABLETS BY MOUTH EVERY MORNING (Patient taking differently: TAKE 1 TABLET (25 MG) BY MOUTH EVERY MORNING)  . zolpidem (AMBIEN) 5 MG tablet TAKE 1 TABLET BY MOUTH AT BEDTIME   No facility-administered encounter medications on file as of 01/06/2017.       Review of Systems  Constitutional: Negative.   HENT: Positive for sinus pain and sinus pressure.   Eyes: Negative.   Respiratory: Negative.   Cardiovascular: Negative.   Gastrointestinal: Negative.   Endocrine: Negative.   Genitourinary: Negative.   Musculoskeletal: Negative.   Skin: Negative.   Allergic/Immunologic: Negative.   Neurological: Negative.   Hematological: Negative.   Psychiatric/Behavioral: Negative.        Objective:   Physical Exam  Constitutional: She is oriented to person, place, and time. She appears well-developed and well-nourished. No distress.  HENT:  Head: Normocephalic.  Right Ear: External ear normal.  Left Ear: External ear normal.  Mouth/Throat: Oropharynx is clear and moist.  Slight nasal congestion bilaterally  Eyes: Conjunctivae and EOM are normal. Pupils are  equal, round, and reactive to light. Right eye exhibits no discharge. Left eye exhibits no discharge. No scleral icterus.  Neck: Normal range of motion. Neck supple. No thyromegaly present.  Cardiovascular: Normal rate, regular rhythm and normal heart sounds.   No murmur heard. Pulmonary/Chest: Effort normal. No respiratory distress. She has no wheezes. She has no rales.  Rhonchi with coughing and  taking deep breaths no rales.  Musculoskeletal: Normal range of motion. She exhibits no edema.  Lymphadenopathy:    She has no cervical adenopathy.  Neurological: She is alert and oriented to person, place, and time.  Skin: Skin is warm and dry. No rash noted.  Psychiatric: She has a normal mood and affect. Her behavior is normal. Judgment and thought content normal.  Nursing note and vitals reviewed.   BP 122/66 (BP Location: Right Arm)   Pulse 84   Temp 99.1 F (37.3 C) (Oral)   Ht 5\' 1"  (1.549 m)   Wt 121 lb (54.9 kg)   BMI 22.86 kg/m        Assessment & Plan:  1. Sinus congestion -Continue with nasal saline and Flonase and Allegra-D - Ambulatory referral to Allergy  2. Sinusitis, unspecified chronicity, unspecified location -Continue and complete Augmentin - Ambulatory referral to Allergy  3. Sinobronchitis -Take Augmentin, use Mucinex as directed, use Flonase and use nasal saline and coolmist humidification  Patient Instructions  Continue with Allegra-D Add Mucinex maximum strength, blue and white in color, 1 twice daily with a large glass of water Continue and complete the Augmentin which is the antibiotic Drink plenty of fluids and stay well hydrated Continue to use nasal saline and use the cool mist humidifier in the bedroom at nighttime Run an add "For free cat To good home!"  Nyra Capeson W. Cloie Wooden MD

## 2017-01-07 ENCOUNTER — Telehealth: Payer: Self-pay | Admitting: Family Medicine

## 2017-01-07 NOTE — Telephone Encounter (Signed)
Pt notified of recommendation Verbalizes understanding 

## 2017-01-07 NOTE — Telephone Encounter (Signed)
Per pt she doesn't recall having problems from Amoxicillin in the past

## 2017-01-07 NOTE — Telephone Encounter (Signed)
Hold amoxicillin for one day and then restart and take Imodium as needed and drink lots of fluids and avoid caffeine which I don't think she is doing anyway.

## 2017-01-07 NOTE — Telephone Encounter (Signed)
Has she had problems taking amoxicillin in the past? Especially with loose bowel movements?

## 2017-01-07 NOTE — Telephone Encounter (Signed)
Per pt she started taking Amoxicillin on Tuesday She is now having diarrhea Do you want to change antibiotic or can pt try Imodium Please advise

## 2017-01-08 ENCOUNTER — Telehealth: Payer: Self-pay | Admitting: Family Medicine

## 2017-01-21 ENCOUNTER — Other Ambulatory Visit: Payer: Self-pay | Admitting: Family Medicine

## 2017-01-25 ENCOUNTER — Other Ambulatory Visit: Payer: Self-pay | Admitting: *Deleted

## 2017-01-26 MED ORDER — ZOLPIDEM TARTRATE 5 MG PO TABS: ORAL_TABLET | ORAL | 0 refills | Status: DC

## 2017-02-02 ENCOUNTER — Ambulatory Visit: Payer: Medicare Other | Admitting: Family Medicine

## 2017-02-03 ENCOUNTER — Telehealth: Payer: Self-pay | Admitting: Family Medicine

## 2017-02-03 DIAGNOSIS — J329 Chronic sinusitis, unspecified: Secondary | ICD-10-CM

## 2017-02-03 DIAGNOSIS — R05 Cough: Secondary | ICD-10-CM

## 2017-02-03 DIAGNOSIS — R059 Cough, unspecified: Secondary | ICD-10-CM

## 2017-02-03 NOTE — Telephone Encounter (Signed)
Please advise 

## 2017-02-04 NOTE — Telephone Encounter (Signed)
Pt VM - referral placed

## 2017-02-12 ENCOUNTER — Ambulatory Visit (INDEPENDENT_AMBULATORY_CARE_PROVIDER_SITE_OTHER): Payer: Medicare Other | Admitting: Family Medicine

## 2017-02-12 ENCOUNTER — Encounter: Payer: Self-pay | Admitting: Family Medicine

## 2017-02-12 ENCOUNTER — Ambulatory Visit (INDEPENDENT_AMBULATORY_CARE_PROVIDER_SITE_OTHER): Payer: Medicare Other

## 2017-02-12 VITALS — BP 118/59 | HR 77 | Temp 97.9°F | Ht 61.0 in | Wt 120.8 lb

## 2017-02-12 DIAGNOSIS — E7800 Pure hypercholesterolemia, unspecified: Secondary | ICD-10-CM

## 2017-02-12 DIAGNOSIS — Z1211 Encounter for screening for malignant neoplasm of colon: Secondary | ICD-10-CM | POA: Diagnosis not present

## 2017-02-12 DIAGNOSIS — Z780 Asymptomatic menopausal state: Secondary | ICD-10-CM | POA: Diagnosis not present

## 2017-02-12 DIAGNOSIS — E559 Vitamin D deficiency, unspecified: Secondary | ICD-10-CM

## 2017-02-12 DIAGNOSIS — I10 Essential (primary) hypertension: Secondary | ICD-10-CM | POA: Diagnosis not present

## 2017-02-12 DIAGNOSIS — J309 Allergic rhinitis, unspecified: Secondary | ICD-10-CM | POA: Diagnosis not present

## 2017-02-12 NOTE — Patient Instructions (Addendum)
Continue current medications. Continue good therapeutic lifestyle changes which include good diet and exercise. Fall precautions discussed with patient. If an FOBT was given today- please return it to our front desk. If you are over 76 years old - you may need Prevnar 13 or the adult Pneumonia vaccine.  Flu Shots will be available at our office starting mid- September. Please call and schedule a FLU CLINIC APPOINTMENT.  Please call the allergist office and see if you can try an antihistamine like Claritin prior to your visit We will arrange for you to have a visit with physical therapy [place] to help with abdominal muscle strengthening and tightening following the hernia repair that you have had Continue to drink plenty of fluids and stay well hydrated Continue with Flonase 1-2 sprays each nostril at bedtime

## 2017-02-12 NOTE — Progress Notes (Signed)
Subjective:    Patient ID: Dorothy Pitts, female    DOB: 03-23-1941, 76 y.o.   MRN: [id number]  HPI Patient is here today for a 4 month follow up on hypertension, and hyperlipidemia. Patient has no other concerns or complaints. The patient is doing well overall. She does havespecific complaints today. She is due to get a DEXA scan and we will try to get that worked out for her today. She will be given an FOBT to return and will get her lab work today. The patient is doing well and does recount the history of multiple upper respiratory infections and congestion during the winter months. She still has some cough. She is currently using Flonase regularly. She has an appointment with an allergy specialist in the next couple weeks. She denies any chest pain or shortness of breath. She denies any trouble with nausea vomiting diarrhea blood in the stool or black tarry bowel movements. She is okay with the heartburn as long she is taking her ranitidine or Zantac. Her last colonoscopy was in 2013 and she was told by the gastroenterologist that she would probably not need any more colonoscopies. She will be given an FOBT to return today. She is passing her water without problems.   Review of Systems  Constitutional: Negative.   HENT: Negative.   Eyes: Negative.   Respiratory: Negative.   Cardiovascular: Negative.   Gastrointestinal: Negative.   Endocrine: Negative.   Genitourinary: Negative.   Musculoskeletal: Negative.   Skin: Negative.   Allergic/Immunologic: Negative.   Neurological: Negative.   Hematological: Negative.   Psychiatric/Behavioral: Negative.       Patient Active Problem List   Diagnosis Date Noted  . Ventral hernia 09/04/2016  . Pre-diabetes 11/28/2015  . Hyperlipidemia 08/28/2015  . Fall at home 06/24/2015  . Thoracic aortic atherosclerosis ([place]) 04/15/2015  . Osteoporosis 11/07/2014  . Hypokalemia 10/08/2013  . Anemia 10/08/2013  . Diverticulosis of colon (without  mention of hemorrhage)   . Hypertension   . IBS (irritable bowel syndrome)    Outpatient Encounter Prescriptions as of 02/12/2017  Medication Sig  . Ascorbic Acid (VITAMIN C) 1000 MG tablet Take 1,000 mg by mouth daily. Reported on 11/21/2015  . calcium carbonate (OSCAL) 1500 (600 Ca) MG TABS tablet Take 600 mg of elemental calcium by mouth daily.  . Cholecalciferol (VITAMIN D3) 2000 UNITS TABS Take 1 tablet by mouth daily.  . enalapril (VASOTEC) 5 MG tablet TAKE 1 TABLET EVERY DAY  . ezetimibe (ZETIA) 10 MG tablet Take 1 tablet (10 mg total) by mouth daily.  . fluticasone (FLONASE) 50 MCG/ACT nasal spray Place 2 sprays into both nostrils daily. (Patient taking differently: Place 2 sprays into both nostrils at bedtime. )  . Multiple Vitamin (MULTIVITAMIN) tablet Take 1 tablet by mouth daily.  . raloxifene (EVISTA) 60 MG tablet TAKE 1 TABLET EVERY DAY  . spironolactone (ALDACTONE) 25 MG tablet TAKE 2 TABLETS BY MOUTH EVERY MORNING (Patient taking differently: TAKE 1 TABLET (25 MG) BY MOUTH EVERY MORNING)  . zolpidem (AMBIEN) 5 MG tablet Take 1/2-1 at bedtime if needed and try to reduce the use of this so that we can switch to something different.  . [DISCONTINUED] acetaminophen (TYLENOL) 500 MG tablet Take 500 mg by mouth every 6 (six) hours as needed for mild pain.  . [DISCONTINUED] amoxicillin-clavulanate (AUGMENTIN) 875-125 MG tablet Take 1 tablet by mouth 2 (two) times daily. Take all of this medication  . [DISCONTINUED] fexofenadine-pseudoephedrine (ALLEGRA-D 24) 180-240 MG 24  hr tablet Take 1 tablet by mouth every evening. For allergy and congestion   No facility-administered encounter medications on file as of 02/12/2017.        Objective:   Physical Exam  Constitutional: She is oriented to person, place, and time. She appears well-developed and well-nourished. No distress.  The patient is pleasant and alert  HENT:  Head: Normocephalic and atraumatic.  Right Ear: External ear normal.   Left Ear: External ear normal.  Nose: Nose normal.  Mouth/Throat: Oropharynx is clear and moist.  Nasal turbinate congestion bilaterally Bilateral hearing aids  Eyes: Conjunctivae and EOM are normal. Pupils are equal, round, and reactive to light. Right eye exhibits no discharge. Left eye exhibits no discharge. No scleral icterus.  Neck: Normal range of motion. Neck supple. No thyromegaly present.  Bruits thyromegaly or anterior cervical adenopathy not present  Cardiovascular: Normal rate, regular rhythm, normal heart sounds and intact distal pulses.   No murmur heard. Heart has a regular rate and rhythm at 72/m  Pulmonary/Chest: Effort normal and breath sounds normal. No respiratory distress. She has no wheezes. She has no rales.  Dry cough without congestion or wheezes  Abdominal: Soft. Bowel sounds are normal. She exhibits no mass. There is no tenderness. There is no rebound and no guarding.  Musculoskeletal: Normal range of motion. She exhibits no edema.  Lymphadenopathy:    She has no cervical adenopathy.  Neurological: She is alert and oriented to person, place, and time. She has normal reflexes. No cranial nerve deficit.  Skin: Skin is warm and dry. No rash noted.  Psychiatric: She has a normal mood and affect. Her behavior is normal. Judgment and thought content normal.  Nursing note and vitals reviewed.   BP (!) 118/59   Pulse 77   Temp 97.9 F (36.6 C) (Oral)   Ht 5' 1"  (1.549 m)   Wt 120 lb 12.8 oz (54.8 kg)   BMI 22.82 kg/m         Assessment & Plan:  1. Pure hypercholesterolemia -The patient is currently not taking anything for her cholesterol. She is been intolerant to some of the statins in the past. She was taking Zetia but stopped this. - Lipid panel  2. Essential hypertension -The blood pressure is good and she will continue to watch her sodium intake and take her current medication. - BMP8+EGFR - Hepatic function panel  3. Vitamin D  deficiency -Continue vitamin D replacement pending results of lab work - VITAMIN D 25 Hydroxy (Vit-D Deficiency, Fractures)  4. Screening for malignant neoplasm of colon - Fecal occult blood, imunochemical; Future  5. Postmenopausal - DG [facility] DEXA  6. Allergic rhinitis, unspecified seasonality, unspecified trigger -Continue with Flonase 1-2 sprays each nostril at bedtime and use nasal saline during the day. Follow-up with visit to allergy specialist. Considered going ahead and starting antihistamine after calling the allergy specialist to make sure that this would not interfere with any testing that he would be doing.  Patient Instructions  Continue current medications. Continue good therapeutic lifestyle changes which include good diet and exercise. Fall precautions discussed with patient. If an FOBT was given today- please return it to our front desk. If you are over 65 years old - you may need Prevnar 73 or the adult Pneumonia vaccine.  Flu Shots will be available at our office starting mid- September. Please call and schedule a FLU CLINIC APPOINTMENT.  Please call the allergist office and see if you can try an antihistamine like  Claritin prior to your visit We will arrange for you to have a visit with physical therapy [place] to help with abdominal muscle strengthening and tightening following the hernia repair that you have had Continue to drink plenty of fluids and stay well hydrated Continue with Flonase 1-2 sprays each nostril at bedtime     Arrie Senate MD

## 2017-02-13 LAB — BMP8+EGFR
BUN/Creatinine Ratio: 30 — ABNORMAL HIGH (ref 12–28)
BUN: 18 mg/dL (ref 8–27)
CALCIUM: 10.4 mg/dL — AB (ref 8.7–10.3)
CHLORIDE: 98 mmol/L (ref 96–106)
CO2: 26 mmol/L (ref 18–29)
Creatinine, Ser: 0.61 mg/dL (ref 0.57–1.00)
GFR, EST AFRICAN AMERICAN: 103 mL/min/{1.73_m2} (ref >59)
GFR, EST NON AFRICAN AMERICAN: 89 mL/min/{1.73_m2} (ref >59)
Glucose: 97 mg/dL (ref 65–99)
Potassium: 4.8 mmol/L (ref 3.5–5.2)
Sodium: 139 mmol/L (ref 134–144)

## 2017-02-13 LAB — LIPID PANEL
CHOL/HDL RATIO: 3.4 ratio (ref 0.0–4.4)
CHOLESTEROL TOTAL: 249 mg/dL — AB (ref 100–199)
HDL: 74 mg/dL (ref >39)
LDL Calculated: 147 mg/dL — ABNORMAL HIGH (ref 0–99)
TRIGLYCERIDES: 139 mg/dL (ref 0–149)
VLDL Cholesterol Cal: 28 mg/dL (ref 5–40)

## 2017-02-13 LAB — HEPATIC FUNCTION PANEL
ALT: 8 IU/L (ref 0–32)
AST: 13 IU/L (ref 0–40)
Albumin: 4.3 g/dL (ref 3.5–4.8)
Alkaline Phosphatase: 48 IU/L (ref 39–117)
BILIRUBIN TOTAL: 0.3 mg/dL (ref 0.0–1.2)
BILIRUBIN, DIRECT: 0.08 mg/dL (ref 0.00–0.40)
Total Protein: 6.3 g/dL (ref 6.0–8.5)

## 2017-02-13 LAB — VITAMIN D 25 HYDROXY (VIT D DEFICIENCY, FRACTURES): Vit D, 25-Hydroxy: 33.7 ng/mL (ref 30.0–100.0)

## 2017-02-15 ENCOUNTER — Other Ambulatory Visit: Payer: Self-pay | Admitting: *Deleted

## 2017-02-15 MED ORDER — ROSUVASTATIN CALCIUM 5 MG PO TABS: ORAL_TABLET | ORAL | 1 refills | Status: DC

## 2017-02-17 ENCOUNTER — Ambulatory Visit (INDEPENDENT_AMBULATORY_CARE_PROVIDER_SITE_OTHER): Payer: Medicare Other | Admitting: Allergy and Immunology

## 2017-02-17 ENCOUNTER — Encounter: Payer: Self-pay | Admitting: Allergy and Immunology

## 2017-02-17 VITALS — BP 122/62 | HR 68 | Temp 97.9°F | Resp 14 | Ht 59.45 in | Wt 119.2 lb

## 2017-02-17 DIAGNOSIS — J3089 Other allergic rhinitis: Secondary | ICD-10-CM | POA: Diagnosis not present

## 2017-02-17 MED ORDER — FLUTICASONE PROPIONATE 50 MCG/ACT NA SUSP: NASAL | 5 refills | Status: DC

## 2017-02-17 NOTE — Progress Notes (Signed)
Dear Dr. Christell Constant,  Thank you for referring Dorothy Pitts to the [facility] on 02/17/2017.   Below is a summation of this patient's evaluation and recommendations.  Thank you for your referral. I will keep you informed about this patient's response to treatment.   If you have any questions please do not hesitate to contact me.   Sincerely,  Dorothy Priest, MD Allergy / Immunology [facility]   ______________________________________________________________________    NEW PATIENT NOTE  Referring Provider: Ernestina Penna, MD Primary Provider: Rudi Heap, MD Date of office visit: 02/17/2017    Subjective:   Chief Complaint:  Dorothy Pitts (DOB: 1941-07-30) is a 76 y.o. female who presents to the clinic on 02/17/2017 with a chief complaint of Sinusitis .     HPI: Dorothy Pitts presents to this clinic in evaluation of possible allergic disease. She had a distant history of allergies but has not really been bothered for several decades other than to develop "sinus" manifested as occasional stuffy nose and sneezing especially following exposure to mold and smoke for which she uses Flonase successfully.  However, in January and February 2018 she apparently contracted what sounds like 2 episodes of sinusitis with significant nasal congestion and head fullness and rhinorrhea and prolonged cough requiring several antibiotics. In the month of April she is back to her baseline without any difficulty at all while consistently using her Flonase. She does not have any exercise-induced bronchospastic symptoms or cold air induced bronchospastic symptoms or inability to smell or taste or ugly nasal discharge or headaches.  Dorothy Pitts is most concerned about the possibility that she may have acquired a allergy directed against her cat. 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  Past Medical History:  Diagnosis Date  . Acute diverticulitis 10/07/2013   With microperforation.  . Bowel obstruction ([facility])   . Cataract   . Diverticulosis of colon (without mention of hemorrhage)   . GERD (gastroesophageal reflux disease)   . Hemorrhoids   . HOH (hard of hearing)   . Hyperlipidemia   . Hypertension   . IBS (irritable bowel syndrome)   . Osteopenia   . Osteoporosis   . PONV (postoperative nausea and vomiting)   . Thoracic aortic atherosclerosis (H[facility])   . UTI (lower urinary tract infection) 10/07/2013  . Vitamin D deficiency     Past Surgical History:  Procedure Laterality Date  . ABDOMINAL HYSTERECTOMY    . APPENDECTOMY    . BILATERAL OOPHORECTOMY    . BREAST LUMPECTOMY Left   . CATARACT EXTRACTION    . COLON RESECTION N/A 12/01/2013   Procedure: HAND ASSISTED LAPAROSCOPIC PARTIAL COLECTOMY;  Surgeon: Dalia Heading, MD;  Location: [facility];  Service: General;  Laterality: N/A;  . EYE SURGERY    . HEMORRHOID SURGERY    . HERNIA REPAIR  09/04/2016   LAPAROSCOPIC VENTRAL INCISIONAL HERNIA REPAIR POSSIBLE OPEN (N/A)  . INCISIONAL HERNIA REPAIR N/A 09/04/2016   Procedure: LAPAROSCOPIC VENTRAL INCISIONAL HERNIA REPAIR POSSIBLE OPEN;  Surgeon: Ovidio Kin, MD;  Location: [facility] OR;  Service: General;  Laterality: N/A;  . INSERTION OF MESH N/A 09/04/2016   Procedure: INSERTION OF MESH;  Surgeon: Ovidio Kin, MD;  Location: [facility] OR;  Service: General;  Laterality: N/A;  . PARTIAL HYSTERECTOMY    .  TONSILLECTOMY      Allergies as of 02/17/2017      Reactions   Actonel [risedronate Sodium] Other (See Comments)   Reaction=Heart burn   Crestor [rosuvastatin Calcium] Other (See Comments)   Severe myalgias   Fosamax [alendronate Sodium] Other (See Comments)   Reaction=Heart burn   Lovaza [omega-3-acid Ethyl Esters] Other (See Comments)   Reaction=skin bruise    Nsaids Swelling    Reaction=Swelling of face   Other    General anesthesia-nausea   Cephalexin Itching, Rash   Ciprofloxacin Itching, Rash      Medication List      enalapril 5 MG tablet Commonly known as:  VASOTEC TAKE 1 TABLET EVERY DAY   fluticasone 50 MCG/ACT nasal spray Commonly known as:  FLONASE Place 2 sprays into both nostrils daily.   raloxifene 60 MG tablet Commonly known as:  EVISTA TAKE 1 TABLET EVERY DAY   ranitidine 150 MG tablet Commonly known as:  ZANTAC Take 150 mg by mouth 2 (two) times daily.   spironolactone 25 MG tablet Commonly known as:  ALDACTONE TAKE 2 TABLETS BY MOUTH EVERY MORNING   vitamin C 1000 MG tablet Take 1,000 mg by mouth daily. Reported on 11/21/2015   Vitamin D3 2000 units Tabs Take 1 tablet by mouth daily.   zolpidem 5 MG tablet Commonly known as:  AMBIEN Take 1/2-1 at bedtime if needed and try to reduce the use of this so that we can switch to something different.       Review of systems negative except as noted in HPI / PMHx or noted below:  Review of Systems  Constitutional: Negative.   HENT: Negative.   Eyes: Negative.   Respiratory: Negative.   Cardiovascular: Negative.   Gastrointestinal: Negative.   Genitourinary: Negative.   Musculoskeletal: Negative.   Skin: Negative.   Neurological: Negative.   Endo/Heme/Allergies: Negative.   Psychiatric/Behavioral: Negative.     Family History  Problem Relation Age of Onset  . Hypertension Mother   . Heart attack Mother 67  . Parkinson's disease Father   . Heart disease Brother 45    bypass  . Obesity Brother   . Colon cancer Neg Hx     Social History   Social History  . Marital status: Widowed    Spouse name: N/A  . Number of children: 3  . Years of education: N/A   Occupational History  . retired Retired   Social History Main Topics  . Smoking status: Former Smoker    Packs/day: 1.00    Years: 35.00    Types: Cigarettes    Quit date: 10/26/2002  . Smokeless tobacco:  Never Used  . Alcohol use 3.0 oz/week    5 Glasses of wine per week     Comment: wine  . Drug use: No  . Sexual activity: No   Other Topics Concern  . Not on file   Social History Narrative  . No narrative on file    Environmental and Social history  Lives in a house with a dry environment, a cat located inside the household, carpeting in the bedroom, no plastic on the bed or pillow, and no smokers located inside the household.  Objective:   Vitals:   02/17/17 0851  BP: 122/62  Pulse: 68  Resp: 14  Temp: 97.9 F (36.6 C)   Height: 4' 11.45" (151 cm) Weight: 119 lb 3.2 oz (54.1 kg)  Physical Exam  Constitutional: She is well-developed, well-nourished, and in no distress.  HENT:  Head: Normocephalic. Head is without right periorbital erythema and without left periorbital erythema.  Right Ear: Tympanic membrane, external ear and ear canal normal.  Left Ear: Tympanic membrane, external ear and ear canal normal.  Nose: Nose normal. No mucosal edema or rhinorrhea.  Mouth/Throat: Uvula is midline, oropharynx is clear and moist and mucous membranes are normal. No oropharyngeal exudate.  Bilateral hearing aids  Eyes: Conjunctivae and lids are normal. Pupils are equal, round, and reactive to light.  Neck: Trachea normal. No tracheal tenderness present. No tracheal deviation present. No thyromegaly present.  Cardiovascular: Normal rate, regular rhythm, S1 normal, S2 normal and normal heart sounds.   No murmur heard. Pulmonary/Chest: Effort normal and breath sounds normal. No stridor. No tachypnea. No respiratory distress. She has no wheezes. She has no rales. She exhibits no tenderness.  Abdominal: Soft. She exhibits no distension and no mass. There is no hepatosplenomegaly. There is no tenderness. There is no rebound and no guarding.  Musculoskeletal: She exhibits no edema or tenderness.  Lymphadenopathy:       Head (right side): No tonsillar adenopathy present.       Head  (left side): No tonsillar adenopathy present.    She has no cervical adenopathy.    She has no axillary adenopathy.  Neurological: She is alert. Gait normal.  Skin: No rash noted. She is not diaphoretic. No erythema. No pallor. Nails show no clubbing.  Psychiatric: Mood and affect normal.    Diagnostics: Allergy skin tests were performed against cat and dust mite. She did not demonstrate any hypersensitivity against these aeroallergens.  Results of blood tests obtained on 10/23/2016 identified a white blood cell count of 4.1 with a relatively normal differential, eosinophil count 100, hemoglobin 11.8, platelet 306  Results of a chest x-ray obtained 10/23/2016 identified the following:  Heart and mediastinal contours are within normal limits. No focal opacities or effusions. No acute bony abnormality.   Assessment and Plan:    1. Other allergic rhinitis     1. Allergen avoidance measures?  2. Continue Flonase one-2 sprays each nostril one time per day  3. If needed:   A. nasal saline  B. OTC antihistamine - Claritin/Allegra/Zyrtec   4. Further evaluation and treatment? Yes, if problems  I suspect that Dorothy Pitts was just very unlucky this winter and contracted to different forms of infections at 2 different points in time and there does not appear to be any significant allergy directed against her cat. I suspect that she will do well with medical therapy specified above and if she does not do well and he redeveloped significant respiratory tract symptoms that she moves forward then she can come back to this clinic for further evaluation.  Dorothy Priest, MD [facility]

## 2017-02-17 NOTE — Patient Instructions (Addendum)
  1. Allergen avoidance measures?  2. Continue Flonase one-2 sprays each nostril one time per day  3. If needed:   A. nasal saline  B. OTC antihistamine - Claritin/Allegra/Zyrtec   4. Further evaluation and treatment? Yes, if problems

## 2017-02-23 ENCOUNTER — Other Ambulatory Visit: Payer: Self-pay | Admitting: Family Medicine

## 2017-02-25 ENCOUNTER — Other Ambulatory Visit: Payer: Self-pay | Admitting: Family Medicine

## 2017-03-02 ENCOUNTER — Ambulatory Visit: Payer: Medicare Other | Admitting: Pharmacist

## 2017-03-10 ENCOUNTER — Encounter: Payer: Self-pay | Admitting: Pharmacist

## 2017-03-10 ENCOUNTER — Ambulatory Visit (INDEPENDENT_AMBULATORY_CARE_PROVIDER_SITE_OTHER): Payer: Medicare Other | Admitting: Pharmacist

## 2017-03-10 DIAGNOSIS — M810 Age-related osteoporosis without current pathological fracture: Secondary | ICD-10-CM

## 2017-03-10 MED ORDER — RISEDRONATE SODIUM 35 MG PO TBEC: 35.0000 mg | DELAYED_RELEASE_TABLET | ORAL | 5 refills | Status: DC

## 2017-03-10 NOTE — Progress Notes (Signed)
Patient ID: [name], female   DOB: 1941/02/18, 76 y.o.   MRN: [id number]     HPI: Dorothy Pitts has been taking Evista for the last 4 years for Osteoporosis.   Tried bisphosphonate / Fosamac in past but was stopped because it cause worsening of GERD Also took Forteo for about 1 year - stopped due to cost                                                             PMH: Hysterectomy?  Yes Oophorectomy?  Yes HRT? Yes - Former.  Type/duration: premarin several years Steroid Use?  No Thyroid med?  No History of cancer?  No History of digestive disorders (ie Crohn's)?  Yes - GERD; history of hernia - inguinal.  Current or previous eating disorders?  No Last Vitamin D Result:  33.7 (02/12/2017) Last GFR Result:  89 (02/12/2017)   FH/SH: Family history of osteoporosis?  No Parent with history of hip fracture?  No Family history of breast cancer?  No Exercise?  Yes - golfs regularly and doing PT of abdominal strength Smoking?  No Alcohol?  No    Calcium Assessment Calcium Intake  # of servings/day  Calcium mg  Milk (8 oz) 0  x  300  = 0  Yogurt (4 oz) 0 x  200 = 0  Cheese (1 oz) 1 x  200 = 200mg   Other Calcium sources   250mg   Ca supplement Not regularly = 0   Estimated calcium intake per day 450mg     DEXA Results Date of Test T-Score for AP Spine L1-L4 T-Score for Neck Left Hip T-Score for Total Right Hip  02/12/2017 -0.8 -2.6 -2.6  03/15/2015 -0.9 -2.4 -2.5  03/14/2008 -1.0 -2.2 -2.6  03/07/2007 -1.1 -2.3 -2.8   Assessment: ostoeporosis  Recommendations: 1.  D/C evista;  Start Atelvia 35mg  - take 1 tablet weekly, OK to take with food. Take with full glass of water 2.  recommend calcium 1200mg  daily through supplementation or diet.  3.  continue weight bearing exercise - 30 minutes at least 4 days per week.   4.  Counseled and educated about fall risk and prevention.  Recheck DEXA:  2 years  Time spent counseling patient:  30 minutes

## 2017-03-10 NOTE — Patient Instructions (Signed)
Calcium & Vitamin D: The Facts  Why is calcium and vitamin D consumption important? Calcium: . Most Americans do not consume adequate amounts of calcium! Calcium is required for proper muscle function, nerve communication, bone support, and many other functions in the body.  . The body uses bones as a source of calcium. Bones 'remodel' themselves continuously - the body constantly breaks bone down to release calcium and rebuilds bones by replacing calcium in the bone later.  . As we get older, the rate of bone breakdown occurs faster than bone rebuilding which could lead to osteopenia, osteoporosis, and possible fractures.   Vitamin D: . People naturally make vitamin D in the body when sunlight hits the skin and triggers a process that leads to vitamin D production. This natural vitamin D production requires about 10-15 minutes of sun exposure on the hands, arms, and face at least 2-3 times per week. However, due to decreased sun exposure and the use of sunscreen, most people will need to get additional vitamin D from foods or supplements. Your doctor can measure your body's vitamin D level through a simple blood test to determine your daily vitamin D needs.  . Vitamin D is used to help the body absorb calcium, maintain bone health, help the immune system, and reduce inflammation. It also plays a role in muscle performance, balance and risk of falling.  . Vitamin D deficiency can lead to osteomalacia or softening of the bones, bone pain, and muscle weakness.   The recommended daily allowance of Calcium and Vitamin D varies for different age groups. Age group Calcium (mg) Vitamin D (IU)  Females and Males: Age 19-50 1000 mg 600 IU  Females: Age 51- 70 1200 mg 600 IU  Males: Age 51-70 1000 mg 600 IU  Females and Males: Age 71+ 1200 mg 800 IU  Pregnant/lactating Females age 19-50 1000 mg 600 IU   How much Calcium do you get in your diet? Calcium Intake # of servings per day  Total calcium (mg)   Skim milk, 2% milk (1 cup) _________ x 300 mg   Yogurt (1 small container) _________ x 200 mg   Cheese (1oz) _________ x 200 mg   Cottage Cheese (1 cup)             ________ x 150 mg   Almond milk (1 cup) _________ x 450 mg   Fortified Orange Juice (1 cup) _________ x 300 mg   Broccoli or spinach ( 1 cup) _________ x 100 mg   Salmon (3 oz) _________ x 150 mg    Almonds (1/4 cup) _______ x 90 mg      How do we get Calcium and Vitamin D in our diet? Calcium: . Obtaining calcium from the diet is the most preferred way to reach the recommended daily goal. If this goal is not reached through diet, calcium supplements are available.  . Calcium is found in many foods including: dairy products, dark leafy vegetables (like broccoli, kale, and spinach), fish, and fortified products like juices and cereals.  . The food label will have a %DV (percent daily value) listed showing the amount of calcium per serving. To determine the total mg per serving, simply replace the % with zero (0).  For example, Almond Breeze almond milk contains 45% DV of calcium or 450mg per 1 cup.  . You can increase the amount of calcium in your diet by using more calcium products in your daily meals. Use yogurt and fruit to   make smoothies or use yogurt to top baked potatoes or make whipped potatoes. Sprinkle low fat cheese onto salads or into egg white omelets. You can even add non-fat dry milk powder (300mg calcium per 1/3 cup) to hot cereals, meat loaf, soups, or potatoes.  . Calcium supplements come in many forms including tablets, chewables, and gummies. Be sure to read the label to determine the correct number of tablets per serving and whether or not to take the supplement with food.  . Calcium carbonate products (Oscal, Caltrate, and Viactiv) are generally better absorbed when taken with food while calcium citrate products like Citracal can be taken with or without food.  . The body can only absorb about 600 mg of calcium  at one time. It is recommended to take calcium supplements in small amounts several times per day.  However, taking it all at once is better than not taking it at all. . Increasing your intake of calcium is essential for bone health, but may also lead to some side effects like constipation, increased gas, bloating or abdominal cramping. To help reduce these side effects, start with 1 tablet per day and slowly increase your intake of the supplement to the recommended doses. It is also recommended that you drink plenty of water each day. Vitamin D: . Very few foods naturally contain vitamin D. However, it is found in saltwater fish (like tuna, salmon and mackerel), beef liver, egg yolks, cheese and vitamin D fortified foods (like yogurt, cereals, orange juice and milk) . The amount of vitamin D in each food or product is listed as %DV on the product label. To determine the total amount of vitamin D per serving, drop the % sign and multiply the number by 4. For example, 1 cup of Almond Breeze almond milk contains 25% DV vitamin D or 100 IU per serving (25 x 4 =100). . Vitamin D is also found in multivitamins and supplements and may be listed as ergocalciferol (vitamin D2) or cholecalciferol (vitamin D3). Each of these forms of vitamin D are equivalent and the daily recommended intake will vary based on your age and the vitamin D levels in your body. Follow your doctor's recommendation for vitamin D intake.       Fall Prevention in the Home Falls can cause injuries and can affect people from all age groups. There are many simple things that you can do to make your home safe and to help prevent falls. What can I do on the outside of my home? Regularly repair the edges of walkways and driveways and fix any cracks. Remove high doorway thresholds. Trim any shrubbery on the main path into your home. Use bright outdoor lighting. Clear walkways of debris and clutter, including tools and rocks. Regularly check  that handrails are securely fastened and in good repair. Both sides of any steps should have handrails. Install guardrails along the edges of any raised decks or porches. Have leaves, snow, and ice cleared regularly. Use sand or salt on walkways during winter months. In the garage, clean up any spills right away, including grease or oil spills. What can I do in the bathroom? Use night lights. Install grab bars by the toilet and in the tub and shower. Do not use towel bars as grab bars. Use non-skid mats or decals on the floor of the tub or shower. If you need to sit down while you are in the shower, use a plastic, non-slip stool. Keep the floor dry. Immediately clean up   any water that spills on the floor. Remove soap buildup in the tub or shower on a regular basis. Attach bath mats securely with double-sided non-slip rug tape. Remove throw rugs and other tripping hazards from the floor. What can I do in the bedroom? Use night lights. Make sure that a bedside light is easy to reach. Do not use oversized bedding that drapes onto the floor. Have a firm chair that has side arms to use for getting dressed. Remove throw rugs and other tripping hazards from the floor. What can I do in the kitchen? Clean up any spills right away. Avoid walking on wet floors. Place frequently used items in easy-to-reach places. If you need to reach for something above you, use a sturdy step stool that has a grab bar. Keep electrical cables out of the way. Do not use floor polish or wax that makes floors slippery. If you have to use wax, make sure that it is non-skid floor wax. Remove throw rugs and other tripping hazards from the floor. What can I do in the stairways? Do not leave any items on the stairs. Make sure that there are handrails on both sides of the stairs. Fix handrails that are broken or loose. Make sure that handrails are as long as the stairways. Check any carpeting to make sure that it is firmly  attached to the stairs. Fix any carpet that is loose or worn. Avoid having throw rugs at the top or bottom of stairways, or secure the rugs with carpet tape to prevent them from moving. Make sure that you have a light switch at the top of the stairs and the bottom of the stairs. If you do not have them, have them installed. What are some other fall prevention tips? Wear closed-toe shoes that fit well and support your feet. Wear shoes that have rubber soles or low heels. When you use a stepladder, make sure that it is completely opened and that the sides are firmly locked. Have someone hold the ladder while you are using it. Do not climb a closed stepladder. Add color or contrast paint or tape to grab bars and handrails in your home. Place contrasting color strips on the first and last steps. Use mobility aids as needed, such as canes, walkers, scooters, and crutches. Turn on lights if it is dark. Replace any light bulbs that burn out. Set up furniture so that there are clear paths. Keep the furniture in the same spot. Fix any uneven floor surfaces. Choose a carpet design that does not hide the edge of steps of a stairway. Be aware of any and all pets. Review your medicines with your healthcare provider. Some medicines can cause dizziness or changes in blood pressure, which increase your risk of falling. Talk with your health care provider about other ways that you can decrease your risk of falls. This may include working with a physical therapist or trainer to improve your strength, balance, and endurance. This information is not intended to replace advice given to you by your health care provider. Make sure you discuss any questions you have with your health care provider. Document Released: 10/02/2002 Document Revised: 03/10/2016 Document Reviewed: 11/16/2014 Elsevier Interactive Patient Education  2017 Else[employer] Inc.  

## 2017-03-11 ENCOUNTER — Telehealth: Payer: Self-pay | Admitting: Family Medicine

## 2017-03-12 NOTE — Telephone Encounter (Signed)
Patient states she pick up Rx for osteoporosis but that the Rx lists that it is generic Actonel but we wanted generic Atelvia so she could take with food.   She also states that she went to pick up Rx for enalapril and was only given #3 tablets by [facility]. She is going out of town tomorrow for 1 week and is concerned about not having enough medication.  Called Dorothy Pitts at [facility]. She said that they had been having trouble getting the #90 tablets of enalapril to fill Dorothy Pitts's enalapril but that she would fill #30 for her.   Also discussed that patient received generic Actonel instead of Atelvia - Dorothy LeatherwoodKatherine is going to fix this.  Patient to return product that she currently has and will be switched out for generic Atelvia.  Patient aware above.

## 2017-03-16 ENCOUNTER — Ambulatory Visit: Payer: Medicare Other | Admitting: Allergy and Immunology

## 2017-03-16 NOTE — Addendum Note (Signed)
Addended by: Henrene PastorECKARD, Mikael Debell B on: 03/16/2017 11:58 AM   Modules accepted: Level of Service

## 2017-03-23 ENCOUNTER — Ambulatory Visit: Payer: Medicare Other | Admitting: Allergy and Immunology

## 2017-04-13 ENCOUNTER — Other Ambulatory Visit: Payer: Self-pay | Admitting: *Deleted

## 2017-04-13 MED ORDER — ENALAPRIL MALEATE 5 MG PO TABS: 5.0000 mg | ORAL_TABLET | Freq: Every day | ORAL | 1 refills | Status: DC

## 2017-04-19 ENCOUNTER — Telehealth: Payer: Self-pay | Admitting: Pharmacist

## 2017-04-19 MED ORDER — CALCIUM CARBONATE-VITAMIN D 600-400 MG-UNIT PO TABS: 1.0000 | ORAL_TABLET | Freq: Every day | ORAL | Status: DC

## 2017-04-19 NOTE — Telephone Encounter (Signed)
Patient reports that she has severe vomiting and diarrhea when she took first dose of Atelvia.   She would like to switch to a medication her friend is taking - she thinks the name is Prolia.  Discussed pros and cons of Prolia therapy.  Will do insurance verification for Prolia. Reminded patient to continue calcium supplementation.

## 2017-04-26 ENCOUNTER — Telehealth: Payer: Self-pay | Admitting: Pharmacist

## 2017-04-26 NOTE — Telephone Encounter (Signed)
Prolia is covered with zero copay .  Left message for patient to call office is she would like to get - will need to order medication prior to appt.

## 2017-04-27 ENCOUNTER — Ambulatory Visit (INDEPENDENT_AMBULATORY_CARE_PROVIDER_SITE_OTHER): Payer: Medicare Other | Admitting: Pediatrics

## 2017-04-27 ENCOUNTER — Encounter: Payer: Self-pay | Admitting: Pediatrics

## 2017-04-27 ENCOUNTER — Telehealth: Payer: Self-pay | Admitting: Family Medicine

## 2017-04-27 VITALS — BP 129/67 | HR 78 | Temp 98.7°F | Ht 59.45 in | Wt 120.6 lb

## 2017-04-27 DIAGNOSIS — W5501XA Bitten by cat, initial encounter: Secondary | ICD-10-CM | POA: Diagnosis not present

## 2017-04-27 DIAGNOSIS — S80922A Unspecified superficial injury of left lower leg, initial encounter: Secondary | ICD-10-CM

## 2017-04-27 NOTE — Progress Notes (Signed)
  Subjective:   Patient ID: Dorothy Pitts, female    DOB: 03-11-1941, 76 y.o.   MRN: [id number] CC: Animal Bite (2 weeks ago, left lower leg)  HPI: Dorothy CootsJudith M Pitts is a 76 y.o. female presenting for Animal Bite (2 weeks ago, left lower leg)  Her cat bit her, was playing and she moved her leg when he nipped at her, had small scrape Bled some, now with scab Was red around edges Getting better, less red past few days Keeping neosporin on area Was sore at first, now just when she hits it accidentally No pain with walking Cat utd on immunizations including rabies  No fevers, normal appetite Otherwise feeling well  Going to the beach in a week  Relevant past medical, surgical, family and social history reviewed. Allergies and medications reviewed and updated. History  Smoking Status  . Former Smoker  . Packs/day: 1.00  . Years: 35.00  . Types: Cigarettes  . Quit date: 10/26/2002  Smokeless Tobacco  . Never Used   ROS: Per HPI   Objective:    BP 129/67   Pulse 78   Temp 98.7 F (37.1 C) (Oral)   Ht 4' 11.45" (1.51 m)   Wt 120 lb 9.6 oz (54.7 kg)   BMI 23.99 kg/m   Wt Readings from Last 3 Encounters:  04/27/17 120 lb 9.6 oz (54.7 kg)  02/17/17 119 lb 3.2 oz (54.1 kg)  02/12/17 120 lb 12.8 oz (54.8 kg)    Gen: NAD, alert, cooperative with exam, NCAT EYES: EOMI, no conjunctival injection, or no icterus LYMPH: no cervical LAD CV: NRRR, normal S1/S2, no murmur, distal pulses 2+ b/l Resp: CTABL, no wheezes, normal WOB Ext: No edema, warm Neuro: Alert and oriented Skin: apprx 8mm x 5mm dark red scab L lateral lower leg, minimal surrounding erythema No induration No tenderness No discharge  Assessment & Plan:  Dorothy Pitts was seen today for animal bite.  Diagnoses and all orders for this visit:  Cat bite, initial encounter Symptoms and redness improving Appears well healing now Bite now 2 weeks ago from known cat UTD on immunizations Discussed with pt, should  continue to heal If any worsening, discharge let me know and will treat  Follow up plan: As needed Rex Krasarol Vincent, MD [facility]

## 2017-04-27 NOTE — Telephone Encounter (Signed)
Patient notified that insurance verification of Prolia showed that she would have zero copay. She wanted to read more information about Prolia - left brochure for patient to read.

## 2017-05-07 ENCOUNTER — Encounter: Payer: Self-pay | Admitting: Family

## 2017-05-07 ENCOUNTER — Ambulatory Visit (INDEPENDENT_AMBULATORY_CARE_PROVIDER_SITE_OTHER): Payer: Medicare Other | Admitting: Family

## 2017-05-07 VITALS — BP 123/65 | HR 71 | Temp 98.8°F | Ht 59.5 in | Wt 120.0 lb

## 2017-05-07 DIAGNOSIS — S81852D Open bite, left lower leg, subsequent encounter: Secondary | ICD-10-CM

## 2017-05-07 DIAGNOSIS — W5501XD Bitten by cat, subsequent encounter: Secondary | ICD-10-CM | POA: Diagnosis not present

## 2017-05-07 MED ORDER — MUPIROCIN 2 % EX OINT: 1.0000 "application " | TOPICAL_OINTMENT | Freq: Two times a day (BID) | CUTANEOUS | 0 refills | Status: DC

## 2017-05-07 NOTE — Progress Notes (Signed)
   Subjective:    Patient ID: Dorothy Pitts, female    DOB: 1941/05/10, 76 y.o.   MRN: [id number]  HPI Pt presents to the office today to recheck cat bite. Pt states her kitten bite her left lower leg around June 19th. Pt was seen in the office on 04/27/17 and TDAP UTD and told to report any s/s of infection.  Pt is leaving for beach tomorrow and just wanted to check to make sure it was safe to get in the ocean and pool. Pt denies any fever, discharge, or tenderness now.    Review of Systems  All other systems reviewed and are negative.      Objective:   Physical Exam  Constitutional: She is oriented to person, place, and time. She appears well-developed and well-nourished. No distress.  HENT:  Head: Normocephalic.  Eyes: Pupils are equal, round, and reactive to light.  Cardiovascular: Normal rate, regular rhythm, normal heart sounds and intact distal pulses.   No murmur heard. Pulmonary/Chest: Effort normal and breath sounds normal. No respiratory distress. She has no wheezes.  Abdominal: Soft. Bowel sounds are normal. She exhibits no distension. There is no tenderness.  Musculoskeletal: Normal range of motion. She exhibits no edema or tenderness.  Neurological: She is alert and oriented to person, place, and time.  Skin: Skin is warm and dry.  Slight erythemas Lesion on left lower leg, approx 1.5 X0.5 with slight erythemas border. No discharge or warmth present  Psychiatric: She has a normal mood and affect. Her behavior is normal. Judgment and thought content normal.  Vitals reviewed.      BP 123/65   Pulse 71   Temp 98.8 F (37.1 C) (Oral)   Ht 4' 11.5" (1.511 m)   Wt 120 lb (54.4 kg)   BMI 23.83 kg/m      Assessment & Plan:  1. Cat bite of left lower leg, subsequent encounter Keep clean and dry Report any increase in erythemas, discharge, fever or tenderness TDAP UTD RTO prn  - mupirocin ointment (BACTROBAN) 2 %; Apply 1 application topically 2 (two) times  daily.  Dispense: 22 g; Refill: 0   Jannifer Rodneyhristy Hawks, FNP

## 2017-05-07 NOTE — Patient Instructions (Signed)
Animal Bite Animal bites can range from mild to serious. An animal bite can result in a scratch on the skin, a deep open cut, a puncture of the skin, a crush injury, or tearing away of the skin or a body part. A small bite from a house pet will usually not cause serious problems. However, some animal bites can become infected or injure a bone or other tissue. Bites from certain animals can be more dangerous because of the risk of spreading rabies, which is a serious viral infection. This risk is higher with bites from stray animals or wild animals, such as raccoons, foxes, skunks, and bats. Dogs are responsible for most animal bites. Children are bitten more often than adults. What are the signs or symptoms? Common symptoms of an animal bite include:  Pain.  Bleeding.  Swelling.  Bruising.  How is this diagnosed? This condition may be diagnosed based on a physical exam and medical history. Your health care provider will examine the wound and ask for details about the animal and how the bite happened. You may also have tests, such as:  Blood tests to check for infection or to determine if surgery is needed.  X-rays to check for damage to bones or joints.  Culture test. This uses a sample of fluid from the wound to check for infection.  How is this treated? Treatment varies depending on the location and type of animal bite and your medical history. Treatment may include:  Wound care. This often includes cleaning the wound, flushing the wound with saline solution, and applying a bandage (dressing). Sometimes, the wound is left open to heal because of the high risk of infection. However, in some cases, the wound may be closed with stitches (sutures), staples, skin glue, or adhesive strips.  Antibiotic medicine.  Tetanus shot.  Rabies treatment if the animal could have rabies.  In some cases, bites that have become infected may require IV antibiotics and surgical treatment in the  hospital. Follow these instructions at home: Wound care  Follow instructions from your health care provider about how to take care of your wound. Make sure you: ? Wash your hands with soap and water before you change your dressing. If soap and water are not available, use hand sanitizer. ? Change your dressing as told by your health care provider. ? Leave sutures, skin glue, or adhesive strips in place. These skin closures may need to be in place for 2 weeks or longer. If adhesive strip edges start to loosen and curl up, you may trim the loose edges. Do not remove adhesive strips completely unless your health care provider tells you to do that.  Check your wound every day for signs of infection. Watch for: ? Increasing redness, swelling, or pain. ? Fluid, blood, or pus. General instructions  Take or apply over-the-counter and prescription medicines only as told by your health care provider.  If you were prescribed an antibiotic, take or apply it as told by your health care provider. Do not stop using the antibiotic even if your condition improves.  Keep the injured area raised (elevated) above the level of your heart while you are sitting or lying down, if this is possible.  If directed, apply ice to the injured area. ? Put ice in a plastic bag. ? Place a towel between your skin and the bag. ? Leave the ice on for 20 minutes, 2-3 times per day.  Keep all follow-up visits as told by your health care   provider. This is important. Contact a health care provider if:  You have increasing redness, swelling, or pain at the site of your wound.  You have a general feeling of sickness (malaise).  You feel nauseous or you vomit.  You have pain that does not get better. Get help right away if:  You have a red streak extending away from your wound.  You have fluid, blood, or pus coming from your wound.  You have a fever or chills.  You have trouble moving your injured area.  You have  numbness or tingling extending beyond the wound. This information is not intended to replace advice given to you by your health care provider. Make sure you discuss any questions you have with your health care provider. Document Released: 06/30/2011 Document Revised: 02/19/2016 Document Reviewed: 02/27/2015 Elsevier Interactive Patient Education  2018 Elsevier Inc.  

## 2017-05-28 ENCOUNTER — Telehealth: Payer: Self-pay | Admitting: Pharmacist

## 2017-05-28 NOTE — Telephone Encounter (Signed)
Called to follow up with patient about treatment for osteoporosis.  She has decided she wants to start Prolia with she would like to wait until she has taken crestor for 2 weeks first and she is planning to start crestor tonight.  Will have nursing staff follow up with patient at beginning of September about starting Prolia.

## 2017-06-09 ENCOUNTER — Ambulatory Visit (INDEPENDENT_AMBULATORY_CARE_PROVIDER_SITE_OTHER): Payer: Medicare Other | Admitting: Pharmacist

## 2017-06-09 DIAGNOSIS — E7800 Pure hypercholesterolemia, unspecified: Secondary | ICD-10-CM | POA: Diagnosis not present

## 2017-06-09 NOTE — Progress Notes (Signed)
Patient ID: [name], female   DOB: 1941/08/30, 76 y.o.   MRN: [id number]   CC: Medication Question  HPI:  Dorothy Pitts come in today with questions about taking Grape Supplements / Reservatrol for hyperlipidemia. She has visited a winery with her cousin last week and they both bought some supplements. She wonders if she can try this instead of Crestor.  02/12/2017 she saw her PCP and was found to have LDL of 147 and TC of 249.  Dr Christell ConstantMoore prescribed Crestor 5mg  1/2 tablet MWF but she has not started yet because she has had myalgias with statins in past.   CHD risk 18.6% per [facility]  Lipid Panel     Component Value Date/Time   CHOL 249 (H) 02/12/2017 1159   TRIG 139 02/12/2017 1159   TRIG 100 03/18/2016 1057   HDL 74 02/12/2017 1159   HDL 75 03/18/2016 1057   CHOLHDL 3.4 02/12/2017 1159   LDLCALC 147 (H) 02/12/2017 1159   BMP Latest Ref Rng & Units 02/12/2017 09/28/2016 09/12/2016  Glucose 65 - 99 mg/dL 97 629(B104(H) 284(X108(H)  BUN 8 - 27 mg/dL 18 14 12   Creatinine 0.57 - 1.00 mg/dL 3.240.61 4.010.74 0.270.67  BUN/Creat Ratio 12 - 28 30(H) 19 -  Sodium 134 - 144 mmol/L 139 138 138  Potassium 3.5 - 5.2 mmol/L 4.8 4.6 4.5  Chloride 96 - 106 mmol/L 98 97 101  [name] 18 - 29 mmol/L 26 26 29   Calcium 8.7 - 10.3 mg/dL 10.4(H) 9.6 9.1    Assessment:  Hyperlipidemia  Plan:  Discussed current clinical information and studies regarding reservatrol and its effects on cholesterol and heart disease.  I advised patient that she can take reservatrol but that I would also recommend she continue with PCP's plan to start crestor 5mg  1/2 tablet MWF as statin have been shown to decrease CHD and herbal medications have not.  Reminded her to call office is she experiences any adverse effects when starts Crestor. She is to follow up with PCP as planned at end of August.

## 2017-06-21 ENCOUNTER — Other Ambulatory Visit: Payer: Self-pay | Admitting: Family Medicine

## 2017-06-22 ENCOUNTER — Ambulatory Visit (INDEPENDENT_AMBULATORY_CARE_PROVIDER_SITE_OTHER): Payer: Medicare Other | Admitting: Family Medicine

## 2017-06-22 ENCOUNTER — Encounter: Payer: Self-pay | Admitting: Family Medicine

## 2017-06-22 VITALS — BP 132/65 | HR 72 | Temp 97.8°F | Ht 59.5 in | Wt 120.0 lb

## 2017-06-22 DIAGNOSIS — I10 Essential (primary) hypertension: Secondary | ICD-10-CM | POA: Diagnosis not present

## 2017-06-22 DIAGNOSIS — M810 Age-related osteoporosis without current pathological fracture: Secondary | ICD-10-CM

## 2017-06-22 DIAGNOSIS — E559 Vitamin D deficiency, unspecified: Secondary | ICD-10-CM

## 2017-06-22 DIAGNOSIS — J301 Allergic rhinitis due to pollen: Secondary | ICD-10-CM | POA: Diagnosis not present

## 2017-06-22 DIAGNOSIS — I700 Atherosclerosis of aorta: Secondary | ICD-10-CM | POA: Diagnosis not present

## 2017-06-22 DIAGNOSIS — E7800 Pure hypercholesterolemia, unspecified: Secondary | ICD-10-CM | POA: Diagnosis not present

## 2017-06-22 MED ORDER — ROSUVASTATIN CALCIUM 5 MG PO TABS: 2.5000 mg | ORAL_TABLET | ORAL | 3 refills | Status: DC

## 2017-06-22 MED ORDER — ENALAPRIL MALEATE 5 MG PO TABS: 5.0000 mg | ORAL_TABLET | Freq: Every day | ORAL | 3 refills | Status: DC

## 2017-06-22 MED ORDER — ZOLPIDEM TARTRATE 5 MG PO TABS: ORAL_TABLET | ORAL | 5 refills | Status: DC

## 2017-06-22 MED ORDER — FLUTICASONE PROPIONATE 50 MCG/ACT NA SUSP: NASAL | 5 refills | Status: DC

## 2017-06-22 NOTE — Patient Instructions (Addendum)
Medicare Annual Wellness Visit  [place] and the medical providers at [facility] strive to bring you the best medical care.  In doing so we not only want to address your current medical conditions and concerns but also to detect new conditions early and prevent illness, disease and health-related problems.    Medicare offers a yearly Wellness Visit which allows our clinical staff to assess your need for preventative services including immunizations, lifestyle education, counseling to decrease risk of preventable diseases and screening for fall risk and other medical concerns.    This visit is provided free of charge (no copay) for all Medicare recipients. The clinical pharmacists at [facility] have begun to conduct these Wellness Visits which will also include a thorough review of all your medications.    As you primary medical provider recommend that you make an appointment for your Annual Wellness Visit if you have not done so already this year.  You may set up this appointment before you leave today or you may call back ([phone]) and schedule an appointment.  Please make sure when you call that you mention that you are scheduling your Annual Wellness Visit with the clinical pharmacist so that the appointment may be made for the proper length of time.     Continue current medications. Continue good therapeutic lifestyle changes which include good diet and exercise. Fall precautions discussed with patient. If an FOBT was given today- please return it to our front desk. If you are over 47 years old - you may need Prevnar 13 or the adult Pneumonia vaccine.  **Flu shots are available--- please call and schedule a FLU-CLINIC appointment**  After your visit with [place] today you will receive a survey in the mail or online from [employer] regarding your care with [place]. Please take a moment to fill this out. Your feedback is very  important to [place] as you can help [place] better understand your patient needs as well as improve your experience and satisfaction. WE CARE ABOUT YOU!!!   Continue with Flonase and allergen avoidance Continue with nasal saline Debrox can be used to help soften earwax in the ear canal and if it is used 3-4 drops to the affected ear canal nightly for 3 nights and as needed after that. Continue to drink plenty of fluids and stay well hydrated

## 2017-06-22 NOTE — Progress Notes (Signed)
Subjective:    Patient ID: Dorothy Pitts, female    DOB: [birth year], 76 y.o.   MRN: [id number]  HPI Pt here for follow up and management of chronic medical problems which includes hyperlipidemia and hypertension. She is taking medication regularly.The patient doing well overall and has no specific complaints. She is requesting refills on several medications and because she is not fasting she will return to the office for fasting blood work. She also does get her gynecological visit done yearly. The patient denies any chest pain shortness of breath or PND. She does have slight heartburn and indigestion, but not all the time. She is currently taking Zantac 150 usually once a day and as needed. Her bowel habits are unchanged and she does have a history of IBS. She denies any blood in the stool or black tarry bowel movements. When she had her last colonoscopy in 2013 she was told she did not need any more colonoscopies. There is no family history of colon cancer or polyps specific typically in this patient. She does have diverticulosis. She is having no trouble passing her water. She does take a probiotic regularly. She is due to get her eye exam soon and we'll make sure that we get a copy of this report after the exam is completed.    Patient Active Problem List   Diagnosis Date Noted  . Ventral hernia 09/04/2016  . Pre-diabetes 11/28/2015  . Hyperlipidemia 08/28/2015  . Fall at home 06/24/2015  . Thoracic aortic atherosclerosis ([place]) 04/15/2015  . Osteoporosis 11/07/2014  . Hypokalemia 10/08/2013  . Anemia 10/08/2013  . Diverticulosis of colon (without mention of hemorrhage)   . Hypertension   . IBS (irritable bowel syndrome)    Outpatient Encounter Prescriptions as of 06/22/2017  Medication Sig  . Ascorbic Acid (VITAMIN C) 1000 MG tablet Take 1,000 mg by mouth daily. Reported on 11/21/2015  . Calcium Carbonate-Vitamin D (CALTRATE 600+D) 600-400 MG-UNIT tablet Take 1 tablet by mouth  daily.  . Cholecalciferol (VITAMIN D3) 5000 units CAPS Take 1 capsule by mouth every Monday, Wednesday, and Friday.  . enalapril (VASOTEC) 5 MG tablet Take 1 tablet (5 mg total) by mouth daily.  . fluticasone (FLONASE) 50 MCG/ACT nasal spray Use one to two sprays in each nostril once daily as directed.  . ranitidine (ZANTAC) 150 MG tablet Take 150 mg by mouth 2 (two) times daily.  . rosuvastatin (CRESTOR) 5 MG tablet Take 2.5 mg by mouth every Monday, Wednesday, and Friday.   [name] Kitchen spironolactone (ALDACTONE) 25 MG tablet TAKE 2 TABLETS BY MOUTH EVERY MORNING (Patient taking differently: TAKE 1 TABLET (25 MG) BY MOUTH EVERY MORNING)  . zolpidem (AMBIEN) 5 MG tablet TAKE 1/2 TO 1 TABLET AT BEDTIME AS NEEDED  . [DISCONTINUED] mupirocin ointment (BACTROBAN) 2 % Apply 1 application topically 2 (two) times daily.   No facility-administered encounter medications on file as of 06/22/2017.       Review of Systems  Constitutional: Negative.   HENT: Negative.   Eyes: Negative.   Respiratory: Negative.   Cardiovascular: Negative.   Gastrointestinal: Negative.   Endocrine: Negative.   Genitourinary: Negative.   Musculoskeletal: Negative.   Skin: Negative.   Allergic/Immunologic: Negative.   Neurological: Negative.   Hematological: Negative.   Psychiatric/Behavioral: Negative.        Objective:   Physical Exam  Constitutional: She is oriented to person, place, and time. She appears well-developed and well-nourished. No distress.  The patient is pleasant and alert and very  good spirits and staying active during her life.  HENT:  Head: Normocephalic and atraumatic.  Left Ear: External ear normal.  Mouth/Throat: Oropharynx is clear and moist. No oropharyngeal exudate.  Minimal ears cerumen right ear canal Minimal nasal congestion  Eyes: Pupils are equal, round, and reactive to light. Conjunctivae and EOM are normal. Right eye exhibits no discharge. Left eye exhibits no discharge. No scleral  icterus.  Eye exam is due and she will get back in touch with [place] with those results as soon as it is done.  Neck: Normal range of motion. Neck supple. No thyromegaly present.  No bruits thyromegaly or anterior cervical adenopathy  Cardiovascular: Normal rate, regular rhythm, normal heart sounds and intact distal pulses.   No murmur heard. Heart is regular at 72/m  Pulmonary/Chest: Effort normal and breath sounds normal. No respiratory distress. She has no wheezes. She has no rales.  Clear anteriorly and posteriorly  Abdominal: Soft. Bowel sounds are normal. She exhibits no mass. There is no tenderness. There is no rebound and no guarding.  Midline abdominal incision scar. No liver or spleen enlargement no masses no tenderness.  Musculoskeletal: Normal range of motion. She exhibits no edema or tenderness.  Lymphadenopathy:    She has no cervical adenopathy.  Neurological: She is alert and oriented to person, place, and time. She has normal reflexes. No cranial nerve deficit.  Skin: Skin is warm and dry. No rash noted.  Psychiatric: She has a normal mood and affect. Her behavior is normal. Judgment and thought content normal.  Nursing note and vitals reviewed.   BP 132/65 (BP Location: Left Arm)   Pulse 72   Temp 97.8 F (36.6 C) (Oral)   Ht 4' 11.5" (1.511 m)   Wt 120 lb (54.4 kg)   BMI 23.83 kg/m        Assessment & Plan:  1. Pure hypercholesterolemia -The patient in the past has been intolerant to statins. She will continue aggressive therapeutic lifestyle changes pending results of lab work. - CBC with Differential/Platelet; Future - Lipid panel; Future  2. Essential hypertension -Blood pressure is good today and she will continue with her current treatment. - CBC with Differential/Platelet; Future - BMP8+EGFR; Future - Hepatic function panel; Future  3. Vitamin D deficiency -Continue with vitamin D replacement pending results of lab work - CBC with  Differential/Platelet; Future - VITAMIN D 25 Hydroxy (Vit-D Deficiency, Fractures); Future  4. Thoracic aortic atherosclerosis ([place]) -She does have thoracic aortic atherosclerosis. She will continue with aggressive therapeutic lifestyle changes and we will will make any further recommendations regarding her cholesterol once the results are returned.  5. Age-related osteoporosis without current pathological fracture -She should continue to be careful not put herself at risk for fracture by avoiding climbing and being more sure that it. She should continue with calcium and vitamin D replacement.  6. Seasonal allergic rhinitis due to pollen -Continue with Flonase 1-2 sprays each nostril at bedtime and nasal saline during the day.  Meds ordered this encounter  Medications  . enalapril (VASOTEC) 5 MG tablet    Sig: Take 1 tablet (5 mg total) by mouth daily.    Dispense:  90 tablet    Refill:  3  . fluticasone (FLONASE) 50 MCG/ACT nasal spray    Sig: Use one to two sprays in each nostril once daily as directed.    Dispense:  16 g    Refill:  5  . rosuvastatin (CRESTOR) 5 MG tablet  Sig: Take 0.5 tablets (2.5 mg total) by mouth every Monday, Wednesday, and Friday.    Dispense:  45 tablet    Refill:  3  . zolpidem (AMBIEN) 5 MG tablet    Sig: TAKE 1/2 TO 1 TABLET AT BEDTIME AS NEEDED    Dispense:  30 tablet    Refill:  5   Patient Instructions                       Medicare Annual Wellness Visit  [place] and the medical providers at [facility] strive to bring you the best medical care.  In doing so we not only want to address your current medical conditions and concerns but also to detect new conditions early and prevent illness, disease and health-related problems.    Medicare offers a yearly Wellness Visit which allows our clinical staff to assess your need for preventative services including immunizations, lifestyle education, counseling to decrease risk  of preventable diseases and screening for fall risk and other medical concerns.    This visit is provided free of charge (no copay) for all Medicare recipients. The clinical pharmacists at [facility] have begun to conduct these Wellness Visits which will also include a thorough review of all your medications.    As you primary medical provider recommend that you make an appointment for your Annual Wellness Visit if you have not done so already this year.  You may set up this appointment before you leave today or you may call back ([phone]) and schedule an appointment.  Please make sure when you call that you mention that you are scheduling your Annual Wellness Visit with the clinical pharmacist so that the appointment may be made for the proper length of time.     Continue current medications. Continue good therapeutic lifestyle changes which include good diet and exercise. Fall precautions discussed with patient. If an FOBT was given today- please return it to our front desk. If you are over 47 years old - you may need Prevnar 64 or the adult Pneumonia vaccine.  **Flu shots are available--- please call and schedule a FLU-CLINIC appointment**  After your visit with [place] today you will receive a survey in the mail or online from [employer] regarding your care with [place]. Please take a moment to fill this out. Your feedback is very important to [place] as you can help [place] better understand your patient needs as well as improve your experience and satisfaction. WE CARE ABOUT YOU!!!   Continue with Flonase and allergen avoidance Continue with nasal saline Debrox can be used to help soften earwax in the ear canal and if it is used 3-4 drops to the affected ear canal nightly for 3 nights and as needed after that. Continue to drink plenty of fluids and stay well hydrated  Arrie Senate MD

## 2017-07-13 ENCOUNTER — Other Ambulatory Visit: Payer: Self-pay | Admitting: Family Medicine

## 2017-07-14 ENCOUNTER — Other Ambulatory Visit: Payer: Medicare Other

## 2017-07-14 DIAGNOSIS — E7800 Pure hypercholesterolemia, unspecified: Secondary | ICD-10-CM

## 2017-07-14 DIAGNOSIS — I700 Atherosclerosis of aorta: Secondary | ICD-10-CM

## 2017-07-14 DIAGNOSIS — E559 Vitamin D deficiency, unspecified: Secondary | ICD-10-CM

## 2017-07-14 DIAGNOSIS — I10 Essential (primary) hypertension: Secondary | ICD-10-CM

## 2017-07-14 NOTE — Telephone Encounter (Signed)
Last seen 06/22/17  DWM  If approved route to nurse to call into [facility] 

## 2017-07-15 LAB — CBC WITH DIFFERENTIAL/PLATELET
Basophils Absolute: 0 10*3/uL (ref 0.0–0.2)
Basos: 1 %
EOS (ABSOLUTE): 0.2 10*3/uL (ref 0.0–0.4)
EOS: 3 %
HEMATOCRIT: 41.7 % (ref 34.0–46.6)
HEMOGLOBIN: 13.6 g/dL (ref 11.1–15.9)
Immature Grans (Abs): 0 10*3/uL (ref 0.0–0.1)
Immature Granulocytes: 0 %
Lymphocytes Absolute: 1.6 10*3/uL (ref 0.7–3.1)
Lymphs: 32 %
MCH: 32.2 pg (ref 26.6–33.0)
MCHC: 32.6 g/dL (ref 31.5–35.7)
MCV: 99 fL — AB (ref 79–97)
Monocytes Absolute: 0.5 10*3/uL (ref 0.1–0.9)
Monocytes: 10 %
NEUTROS ABS: 2.6 10*3/uL (ref 1.4–7.0)
Neutrophils: 54 %
Platelets: 275 10*3/uL (ref 150–379)
RBC: 4.23 x10E6/uL (ref 3.77–5.28)
RDW: 13.1 % (ref 12.3–15.4)
WBC: 4.9 10*3/uL (ref 3.4–10.8)

## 2017-07-15 LAB — LIPID PANEL
CHOL/HDL RATIO: 2.9 ratio (ref 0.0–4.4)
Cholesterol, Total: 232 mg/dL — ABNORMAL HIGH (ref 100–199)
HDL: 80 mg/dL (ref >39)
LDL Calculated: 137 mg/dL — ABNORMAL HIGH (ref 0–99)
TRIGLYCERIDES: 76 mg/dL (ref 0–149)
VLDL Cholesterol Cal: 15 mg/dL (ref 5–40)

## 2017-07-15 LAB — HEPATIC FUNCTION PANEL
ALBUMIN: 4.5 g/dL (ref 3.5–4.8)
ALT: 13 IU/L (ref 0–32)
AST: 21 IU/L (ref 0–40)
Alkaline Phosphatase: 47 IU/L (ref 39–117)
Bilirubin Total: 0.4 mg/dL (ref 0.0–1.2)
Bilirubin, Direct: 0.12 mg/dL (ref 0.00–0.40)
TOTAL PROTEIN: 6.5 g/dL (ref 6.0–8.5)

## 2017-07-15 LAB — BMP8+EGFR
BUN / CREAT RATIO: 27 (ref 12–28)
BUN: 17 mg/dL (ref 8–27)
CO2: 27 mmol/L (ref 20–29)
Calcium: 10 mg/dL (ref 8.7–10.3)
Chloride: 99 mmol/L (ref 96–106)
Creatinine, Ser: 0.64 mg/dL (ref 0.57–1.00)
GFR calc Af Amer: 100 mL/min/{1.73_m2} (ref >59)
GFR, EST NON AFRICAN AMERICAN: 87 mL/min/{1.73_m2} (ref >59)
Glucose: 113 mg/dL — ABNORMAL HIGH (ref 65–99)
Potassium: 5.1 mmol/L (ref 3.5–5.2)
SODIUM: 141 mmol/L (ref 134–144)

## 2017-07-15 LAB — VITAMIN D 25 HYDROXY (VIT D DEFICIENCY, FRACTURES): Vit D, 25-Hydroxy: 40.6 ng/mL (ref 30.0–100.0)

## 2017-07-26 ENCOUNTER — Telehealth: Payer: Self-pay | Admitting: Family Medicine

## 2017-07-26 ENCOUNTER — Other Ambulatory Visit: Payer: Self-pay | Admitting: *Deleted

## 2017-07-26 DIAGNOSIS — E7800 Pure hypercholesterolemia, unspecified: Secondary | ICD-10-CM

## 2017-07-26 NOTE — Telephone Encounter (Signed)
Patient aware of results.

## 2017-09-17 ENCOUNTER — Other Ambulatory Visit: Payer: Self-pay | Admitting: Family Medicine

## 2017-09-22 ENCOUNTER — Other Ambulatory Visit: Payer: Self-pay | Admitting: Family Medicine

## 2017-09-23 NOTE — Telephone Encounter (Signed)
Last seen 06/22/17  DWM  If approved route to nurse to call into [facility]

## 2017-11-08 ENCOUNTER — Ambulatory Visit: Payer: Medicare Other | Admitting: Family Medicine

## 2017-11-19 ENCOUNTER — Encounter: Payer: Self-pay | Admitting: Family Medicine

## 2017-11-19 ENCOUNTER — Ambulatory Visit (INDEPENDENT_AMBULATORY_CARE_PROVIDER_SITE_OTHER): Payer: Medicare Other | Admitting: Family Medicine

## 2017-11-19 VITALS — BP 115/64 | HR 79 | Temp 97.2°F | Ht 60.0 in | Wt 121.2 lb

## 2017-11-19 DIAGNOSIS — I10 Essential (primary) hypertension: Secondary | ICD-10-CM

## 2017-11-19 DIAGNOSIS — N760 Acute vaginitis: Secondary | ICD-10-CM

## 2017-11-19 DIAGNOSIS — N898 Other specified noninflammatory disorders of vagina: Secondary | ICD-10-CM

## 2017-11-19 DIAGNOSIS — E7800 Pure hypercholesterolemia, unspecified: Secondary | ICD-10-CM

## 2017-11-19 DIAGNOSIS — I700 Atherosclerosis of aorta: Secondary | ICD-10-CM

## 2017-11-19 DIAGNOSIS — E559 Vitamin D deficiency, unspecified: Secondary | ICD-10-CM | POA: Diagnosis not present

## 2017-11-19 LAB — WET PREP FOR TRICH, YEAST, CLUE
CLUE CELL EXAM: NEGATIVE
Trichomonas Exam: NEGATIVE
Yeast Exam: NEGATIVE

## 2017-11-19 MED ORDER — METRONIDAZOLE 0.75 % VA GEL: 1.0000 | Freq: Two times a day (BID) | VAGINAL | 0 refills | Status: DC

## 2017-11-19 NOTE — Patient Instructions (Addendum)
Use soaps fabric softeners and detergents that are scent free  Continue with Flonase and make sure the house as cool as possible and use cool mist humidification in the wintertime and no overhead fans  Use the vaginal cream that were going to call in the day for the itching and discharge and let us know after using it if it does not work.  He will use 1 applicatorful twice daily for 5 days  Retry the Crestor only taking 1/2 pill once weekly and if you develop more muscle aches and myalgias discontinue it completely and let us know.

## 2017-11-19 NOTE — Addendum Note (Signed)
Addended by: Bearl MulberryUTHERFORD, NATALIE K on: 11/19/2017 03:42 PM   Modules accepted: Orders

## 2017-11-19 NOTE — Progress Notes (Signed)
Subjective:    Patient ID: Dorothy Pitts, female    DOB: 01-Mar-1941, 77 y.o.   MRN: [id number]  HPI Pt is here today for follow up of chronic medical conditions which include hypertension and hyperlipidemia.  Pt is also having some vaginal itching and irritation.  The patient is doing well overall.  She does complain of irritable bowel syndrome symptoms as well as some urgency in the and vaginal itching and irritation.  She did not tolerate taking the Crestor even at a low dose because of increased myalgias in her legs arms and wrist when she was taking it.  The vaginal wet prep was negative for clue cells trichomonas and yeast.  The patient denies any chest pain or shortness of breath.  She denies any trouble with her stomach other than her ongoing irritable bowel syndrome symptoms which include alternating constipation and loose bowel movements.  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  Her last colonoscopy was done before that.  There is no family history of colon cancer.  Her next colonoscopy would not be due until 2023.  I did mention the cologuard and she may be interested in doing that at that time.  In the meantime we will continue to do annual fecal occult blood test cards.  She is passing her water well and having no symptoms with voiding other than the vaginal irritation and itching.  Patient recently did use a Diflucan tablet which she took 1 and 1 week later took another one but the itching and discharge have continued.      Review of Systems  Constitutional: Negative.   HENT: Negative.   Eyes: Negative.   Gastrointestinal: Positive for constipation (intermitent due to IBS) and diarrhea (intermitent due to IBS).  Genitourinary: Positive for urgency and vaginal pain (itching with irritation).  Musculoskeletal: Positive for myalgias (legs, arms and wrists when taking Crestor).  Neurological:  Negative.        Objective:   Physical Exam  Constitutional: She is oriented to person, place, and time. She appears well-developed and well-nourished. No distress.  The patient is pleasant and alert  HENT:  Head: Normocephalic and atraumatic.  Right Ear: External ear normal.  Left Ear: External ear normal.  Nose: Nose normal.  Mouth/Throat: Oropharynx is clear and moist. No oropharyngeal exudate.  Wearing bilateral hearing aids  Eyes: Conjunctivae and EOM are normal. Pupils are equal, round, and reactive to light. Right eye exhibits no discharge. Left eye exhibits no discharge. No scleral icterus.  Neck: Normal range of motion. Neck supple. No thyromegaly present.  No bruits thyromegaly or anterior cervical adenopathy  Cardiovascular: Normal rate, regular rhythm, normal heart sounds and intact distal pulses.  No murmur heard. Heart is regular at 84/min  Pulmonary/Chest: Effort normal and breath sounds normal. No respiratory distress. She has no wheezes. She has no rales.  Clear anteriorly and posteriorly  Abdominal: Soft. Bowel sounds are normal. She exhibits no mass. There is tenderness. There is no rebound and no guarding.  Generalized abdominal tenderness secondary to multiple abdominal surgeries  Musculoskeletal: Normal range of motion. She exhibits no edema.  Lymphadenopathy:    She has no cervical adenopathy.  Neurological: She is alert and oriented to person, place, and time. She has normal reflexes. No cranial nerve deficit.  Skin: Skin is warm and dry. No rash noted.  Psychiatric: She has a normal mood and affect. Her  behavior is normal. Judgment and thought content normal.  Nursing note and vitals reviewed.  BP 115/64 (BP Location: Left Arm, Patient Position: Sitting, Cuff Size: Normal)   Pulse 79   Temp (!) 97.2 F (36.2 C) (Oral)   Ht 5' (1.524 m)   Wt 121 lb 3.2 oz (55 kg)   BMI 23.67 kg/m   Wet prep no signs of clue cells Trichomonas or yeast        Assessment & Plan:  1. Vagina itching -MetroGel vaginal 1 applicatorful twice daily for 5 days - WET PREP FOR TRICH, YEAST, CLUE--- all of these tests were negative while the patient was in the office.  2. Thoracic aortic atherosclerosis ([facility]) -Retry Crestor at 1/2 pill once weekly  3. Essential hypertension -Blood pressure is good today and she will continue with current treatment  4. Vitamin D deficiency -Continue with current treatment pending results of lab work  5. Pure hypercholesterolemia -Continue with aggressive therapeutic lifestyle changes and retry Crestor 2-1/2 mg once weekly  6. Acute vaginitis -This is a nonspecific vaginitis with no yeast or clue cells or trichomonas.  The patient will try MetroGel vaginal 1 applicatorful twice daily for 5 days.  No orders of the defined types were placed in this encounter.  Patient Instructions  Use soaps fabric softeners and detergents that are scent free  Continue with Flonase and make sure the house as cool as possible and use cool mist humidification in the wintertime and no overhead fans  Use the vaginal cream that were going to call in the day for the itching and discharge and let us know after using it if it does not work.  He will use 1 applicatorful twice daily for 5 days  Retry the Crestor only taking 1/2 pill once weekly and if you develop more muscle aches and myalgias discontinue it completely and let us know.  Nyra Capes MD

## 2017-11-20 LAB — CBC WITH DIFFERENTIAL/PLATELET
BASOS: 0 %
Basophils Absolute: 0 10*3/uL (ref 0.0–0.2)
EOS (ABSOLUTE): 0.1 10*3/uL (ref 0.0–0.4)
EOS: 1 %
HEMATOCRIT: 38.9 % (ref 34.0–46.6)
Hemoglobin: 13.2 g/dL (ref 11.1–15.9)
IMMATURE GRANS (ABS): 0 10*3/uL (ref 0.0–0.1)
IMMATURE GRANULOCYTES: 0 %
LYMPHS: 25 %
Lymphocytes Absolute: 1.6 10*3/uL (ref 0.7–3.1)
MCH: 32.7 pg (ref 26.6–33.0)
MCHC: 33.9 g/dL (ref 31.5–35.7)
MCV: 96 fL (ref 79–97)
MONOCYTES: 8 %
Monocytes Absolute: 0.5 10*3/uL (ref 0.1–0.9)
NEUTROS PCT: 66 %
Neutrophils Absolute: 4.3 10*3/uL (ref 1.4–7.0)
Platelets: 279 10*3/uL (ref 150–379)
RBC: 4.04 x10E6/uL (ref 3.77–5.28)
RDW: 12.8 % (ref 12.3–15.4)
WBC: 6.5 10*3/uL (ref 3.4–10.8)

## 2017-11-20 LAB — HEPATIC FUNCTION PANEL
ALK PHOS: 62 IU/L (ref 39–117)
ALT: 15 IU/L (ref 0–32)
AST: 16 IU/L (ref 0–40)
Albumin: 4.7 g/dL (ref 3.5–4.8)
BILIRUBIN, DIRECT: 0.1 mg/dL (ref 0.00–0.40)
Bilirubin Total: 0.3 mg/dL (ref 0.0–1.2)
TOTAL PROTEIN: 6.7 g/dL (ref 6.0–8.5)

## 2017-11-20 LAB — BASIC METABOLIC PANEL
BUN/Creatinine Ratio: 23 (ref 12–28)
BUN: 16 mg/dL (ref 8–27)
CHLORIDE: 95 mmol/L — AB (ref 96–106)
CO2: 25 mmol/L (ref 20–29)
Calcium: 10.5 mg/dL — ABNORMAL HIGH (ref 8.7–10.3)
Creatinine, Ser: 0.71 mg/dL (ref 0.57–1.00)
GFR, EST AFRICAN AMERICAN: 96 mL/min/{1.73_m2} (ref >59)
GFR, EST NON AFRICAN AMERICAN: 83 mL/min/{1.73_m2} (ref >59)
Glucose: 100 mg/dL — ABNORMAL HIGH (ref 65–99)
POTASSIUM: 4.2 mmol/L (ref 3.5–5.2)
Sodium: 138 mmol/L (ref 134–144)

## 2017-11-20 LAB — LIPID PANEL
CHOL/HDL RATIO: 2.9 ratio (ref 0.0–4.4)
Cholesterol, Total: 229 mg/dL — ABNORMAL HIGH (ref 100–199)
HDL: 78 mg/dL (ref >39)
LDL Calculated: 133 mg/dL — ABNORMAL HIGH (ref 0–99)
TRIGLYCERIDES: 89 mg/dL (ref 0–149)
VLDL CHOLESTEROL CAL: 18 mg/dL (ref 5–40)

## 2017-11-25 ENCOUNTER — Other Ambulatory Visit: Payer: Self-pay | Admitting: Family Medicine

## 2017-12-14 ENCOUNTER — Other Ambulatory Visit: Payer: Self-pay

## 2017-12-14 MED ORDER — SPIRONOLACTONE 25 MG PO TABS: ORAL_TABLET | ORAL | 0 refills | Status: DC

## 2017-12-20 ENCOUNTER — Encounter: Payer: Self-pay | Admitting: Family Medicine

## 2017-12-20 ENCOUNTER — Ambulatory Visit (INDEPENDENT_AMBULATORY_CARE_PROVIDER_SITE_OTHER): Payer: Medicare Other | Admitting: Family Medicine

## 2017-12-20 VITALS — BP 125/63 | HR 104 | Temp 98.1°F | Ht 60.0 in | Wt 119.8 lb

## 2017-12-20 DIAGNOSIS — M25571 Pain in right ankle and joints of right foot: Secondary | ICD-10-CM | POA: Diagnosis not present

## 2017-12-20 NOTE — Progress Notes (Signed)
   HPI  Patient presents today right ankle pain.  Patient explains that she slipped off of a step Friday causing an inversion of the right ankle with subsequent pain.  She was seen in urgent care on Saturday and had an that was reported to be negative, however they stated that it was not conclusive.  They recommended that she see orthopedic surgery.  She has been using a walker through the weekend and wearing an ankle brace.  She only has pain with movement of the ankle in a certain direction. Has pain with bearing weight, she was able to bear weight after the injury.  PMH: Smoking status noted ROS: Per HPI  Objective: BP 125/63   Pulse (!) 104   Temp 98.1 F (36.7 C) (Oral)   Ht 5' (1.524 m)   Wt 119 lb 12.8 oz (54.3 kg)   BMI 23.40 kg/m  Gen: NAD, alert, cooperative with exam HEENT: NCAT CV: RRR, good S1/S2, no murmur Resp: CTABL, no wheezes, non-labored Ext: No edema, warm Neuro: Alert and oriented, No gross deficits  Assessment and plan:  #Acute right ankle pain Refer to orthopedics, initial x-ray was inconclusive No joint instability on my exam Placed in a walking boot    Orders Placed This Encounter  Procedures  . Ambulatory referral to Orthopedic Surgery    Referral Priority:   Routine    Referral Type:   Surgical    Referral Reason:   Specialty Services Required    Requested Specialty:   Orthopedic Surgery    Number of Visits Requested:   1    Murtis SinkSam Kieara Schwark, MD [facility] 12/20/2017, 2:32 PM

## 2017-12-20 NOTE — Patient Instructions (Signed)
Great to see you!   

## 2017-12-22 DIAGNOSIS — M25571 Pain in right ankle and joints of right foot: Secondary | ICD-10-CM | POA: Insufficient documentation

## 2018-03-15 ENCOUNTER — Other Ambulatory Visit: Payer: Self-pay | Admitting: Family Medicine

## 2018-03-21 ENCOUNTER — Other Ambulatory Visit: Payer: Self-pay | Admitting: Family Medicine

## 2018-03-22 NOTE — Telephone Encounter (Signed)
Last seen 12/20/17  Dr B

## 2018-03-22 NOTE — Telephone Encounter (Signed)
Defer to PCP  Murtis Sink, MD [facility] 03/22/2018, 11:50 AM

## 2018-03-24 ENCOUNTER — Ambulatory Visit: Payer: Medicare Other | Admitting: Family Medicine

## 2018-03-25 ENCOUNTER — Ambulatory Visit (INDEPENDENT_AMBULATORY_CARE_PROVIDER_SITE_OTHER): Payer: Medicare Other | Admitting: Family Medicine

## 2018-03-25 ENCOUNTER — Encounter: Payer: Self-pay | Admitting: Family Medicine

## 2018-03-25 VITALS — BP 119/61 | HR 66 | Temp 97.4°F | Ht 60.0 in | Wt 117.0 lb

## 2018-03-25 DIAGNOSIS — M810 Age-related osteoporosis without current pathological fracture: Secondary | ICD-10-CM

## 2018-03-25 DIAGNOSIS — J301 Allergic rhinitis due to pollen: Secondary | ICD-10-CM

## 2018-03-25 DIAGNOSIS — I10 Essential (primary) hypertension: Secondary | ICD-10-CM

## 2018-03-25 DIAGNOSIS — I700 Atherosclerosis of aorta: Secondary | ICD-10-CM

## 2018-03-25 DIAGNOSIS — R252 Cramp and spasm: Secondary | ICD-10-CM | POA: Diagnosis not present

## 2018-03-25 DIAGNOSIS — E7800 Pure hypercholesterolemia, unspecified: Secondary | ICD-10-CM

## 2018-03-25 DIAGNOSIS — E559 Vitamin D deficiency, unspecified: Secondary | ICD-10-CM | POA: Diagnosis not present

## 2018-03-25 MED ORDER — FLUTICASONE PROPIONATE 50 MCG/ACT NA SUSP: NASAL | 5 refills | Status: DC

## 2018-03-25 MED ORDER — ZOLPIDEM TARTRATE 5 MG PO TABS: ORAL_TABLET | ORAL | 5 refills | Status: DC

## 2018-03-25 MED ORDER — SPIRONOLACTONE 25 MG PO TABS: 50.0000 mg | ORAL_TABLET | Freq: Every day | ORAL | 3 refills | Status: DC

## 2018-03-25 NOTE — Progress Notes (Signed)
Subjective:    Patient ID: Dorothy Pitts, female    DOB: 1941-10-26, 77 y.o.   MRN: [id number]  HPI Pt here for follow up and management of chronic medical problems which includes hypertension. She is taking medication regularly.  Patient is concerned today about Prolia.  She is also concerned about having cramps in the lower legs.  She also wants refills on her Flonase Ambien and Spironolactone.  She is due to return in FOBT and will get lab work today.  She did have a colonoscopy in 2013 and will not be due another colonoscopy for 4 years from now.  She was told by the previous gastroenterologist that she did not have to have any more colonoscopies.  We will confirm that with the current gastroenterologist.  She may be a good candidate for a Cologuard.  The patient today denies any chest pain or shortness of breath anymore than usual.  She has ongoing problems with reflux and heartburn at times and does take Zantac but tells me that she is not necessarily taking the Zantac before she eats and she will change her habits and do a better job with taking it twice a day before meals.  She is passing her water without problems.  She is up-to-date on her eye exams and in fact has had bilateral cataract surgery.  She says she is drinking plenty of water but we will emphasize to her to drink more because of the leg cramps she is having.  We will also do some be 12 testing.  I emphasized to her that I thought Prolia would be a good choice for her to take based on her osteoporosis.     Patient Active Problem List   Diagnosis Date Noted  . Ventral hernia 09/04/2016  . Pre-diabetes 11/28/2015  . Hyperlipidemia 08/28/2015  . Fall at home 06/24/2015  . Thoracic aortic atherosclerosis ([place]) 04/15/2015  . Osteoporosis 11/07/2014  . Hypokalemia 10/08/2013  . Anemia 10/08/2013  . Diverticulosis of colon (without mention of hemorrhage)   . Hypertension   . IBS (irritable bowel syndrome)    Outpatient  Encounter Medications as of 03/25/2018  Medication Sig  . Calcium Carbonate-Vitamin D (CALTRATE 600+D) 600-400 MG-UNIT tablet Take 1 tablet by mouth daily.  . Cholecalciferol (VITAMIN D3) 5000 units CAPS Take 1 capsule by mouth every Monday, Wednesday, and Friday.  . enalapril (VASOTEC) 5 MG tablet Take 1 tablet (5 mg total) by mouth daily.  . fluticasone (FLONASE) 50 MCG/ACT nasal spray Use one to two sprays in each nostril once daily as directed.  . ranitidine (ZANTAC) 150 MG tablet Take 150 mg by mouth 2 (two) times daily.  . rosuvastatin (CRESTOR) 5 MG tablet Take 0.5 tablets (2.5 mg total) by mouth every Monday, Wednesday, and Friday.  [name] [facility] spironolactone (ALDACTONE) 25 MG tablet TAKE 2 TABLETS BY MOUTH EVERY DAY IN THE MORNING  . zolpidem (AMBIEN) 5 MG tablet TAKE 1 TABLET BY MOUTH EVERY DAY AT BEDTIME AS NEEDED  . Ascorbic Acid (VITAMIN C) 1000 MG tablet Take 1,000 mg by mouth daily. Reported on 11/21/2015  . [DISCONTINUED] fluconazole (DIFLUCAN) 150 MG tablet Take 150 mg by mouth daily.  . [DISCONTINUED] ketoconazole (NIZORAL) 2 % cream Apply 1 application topically daily.  . [DISCONTINUED] nitrofurantoin (MACRODANTIN) 100 MG capsule Take 100 mg by mouth 2 times daily at 12 noon and 4 pm.  . [DISCONTINUED] triamcinolone cream (KENALOG) 0.1 % Apply 1 application topically 2 (two) times daily.   No facility-administered  encounter medications on file as of 03/25/2018.      Review of Systems  Constitutional: Negative.   HENT: Negative.   Eyes: Negative.   Respiratory: Negative.   Cardiovascular: Negative.   Gastrointestinal: Negative.   Endocrine: Negative.   Genitourinary: Negative.   Musculoskeletal: Negative.        Cramps - bilateral lower legs  Skin: Negative.   Allergic/Immunologic: Negative.   Neurological: Negative.   Hematological: Negative.   Psychiatric/Behavioral: Negative.        Objective:   Physical Exam  Constitutional: She is oriented to person, place, and time.  She appears well-developed and well-nourished. No distress.  Patient is pleasant and alert and looks much younger than her stated age  HENT:  Head: Normocephalic and atraumatic.  Right Ear: External ear normal.  Left Ear: External ear normal.  Nose: Nose normal.  Mouth/Throat: Oropharynx is clear and moist. No oropharyngeal exudate.  Eyes: Pupils are equal, round, and reactive to light. Conjunctivae and EOM are normal. Right eye exhibits no discharge. Left eye exhibits no discharge. No scleral icterus.  Neck: Normal range of motion. Neck supple. No thyromegaly present.  No bruits thyromegaly or anterior cervical adenopathy  Cardiovascular: Normal rate, regular rhythm, normal heart sounds and intact distal pulses.  No murmur heard. The heart has a regular rate and rhythm at 60/min with good pedal pulses  Pulmonary/Chest: Effort normal and breath sounds normal. She has no wheezes. She has no rales.  Clear anteriorly and posteriorly  Abdominal: Soft. Bowel sounds are normal. She exhibits no distension and no mass. There is tenderness.  Large midline scar from hernia repair and diverticular disease  Musculoskeletal: Normal range of motion. She exhibits no edema.  Lymphadenopathy:    She has no cervical adenopathy.  Neurological: She is alert and oriented to person, place, and time. She has normal reflexes. No cranial nerve deficit.  Skin: Skin is warm and dry. No rash noted.  Psychiatric: She has a normal mood and affect. Her behavior is normal. Judgment and thought content normal.  The patient is pleasant and alert  Nursing note and vitals reviewed.  BP 119/61 (BP Location: Left Arm)   Pulse 66   Temp (!) 97.4 F (36.3 C) (Oral)   Ht 5' (1.524 m)   Wt 117 lb (53.1 kg)   BMI 22.85 kg/m         Assessment & Plan:  1. Essential hypertension -The blood pressure is good today and she will continue with current treatment and sodium restriction - BMP8+EGFR - CBC with  Differential/Platelet - Hepatic function panel  2. Thoracic aortic atherosclerosis ([place]) -She has been taking a low dose of Crestor once weekly and we will hope that this will make some difference with her cholesterol numbers when they get returned.  She should continue with aggressive therapeutic lifestyle changes. - CBC with Differential/Platelet - Lipid panel  3. Vitamin D deficiency -Continue with calcium and vitamin D replacement - CBC with Differential/Platelet - VITAMIN D 25 Hydroxy (Vit-D Deficiency, Fractures)  4. Pure hypercholesterolemia -Continue with statin drug low-dose but taking it regularly - CBC with Differential/Platelet - Lipid panel  5. Muscle cramping -Increase water intake, check electrolytes and B12 level - Vitamin B12  6. Seasonal allergic rhinitis due to pollen -Continue with current treatment  7. Age-related osteoporosis without current pathological fracture -Start Prolia if approved by insurance  Meds ordered this encounter  Medications  . spironolactone (ALDACTONE) 25 MG tablet    Sig: Take  2 tablets (50 mg total) by mouth daily.    Dispense:  180 tablet    Refill:  3  . fluticasone (FLONASE) 50 MCG/ACT nasal spray    Sig: Use one to two sprays in each nostril once daily as directed.    Dispense:  16 g    Refill:  5  . zolpidem (AMBIEN) 5 MG tablet    Sig: TAKE 1 TABLET BY MOUTH EVERY DAY AT BEDTIME AS NEEDED    Dispense:  30 tablet    Refill:  5    Not to exceed 4 additional fills before 05/24/2018.   Patient Instructions                       Medicare Annual Wellness Visit  [facility] and the medical providers at [facility] strive to bring you the best medical care.  In doing so we not only want to address your current medical conditions and concerns but also to detect new conditions early and prevent illness, disease and health-related problems.    Medicare offers a yearly Wellness Visit which allows our  clinical staff to assess your need for preventative services including immunizations, lifestyle education, counseling to decrease risk of preventable diseases and screening for fall risk and other medical concerns.    This visit is provided free of charge (no copay) for all Medicare recipients. The clinical pharmacists at [facility] have begun to conduct these Wellness Visits which will also include a thorough review of all your medications.    As you primary medical provider recommend that you make an appointment for your Annual Wellness Visit if you have not done so already this year.  You may set up this appointment before you leave today or you may call back ([phone]) and schedule an appointment.  Please make sure when you call that you mention that you are scheduling your Annual Wellness Visit with the clinical pharmacist so that the appointment may be made for the proper length of time.     Continue current medications. Continue good therapeutic lifestyle changes which include good diet and exercise. Fall precautions discussed with patient. If an FOBT was given today- please return it to our front desk. If you are over 43 years old - you may need Prevnar 19 or the adult Pneumonia vaccine.  **Flu shots are available--- please call and schedule a FLU-CLINIC appointment**  After your visit with [place] today you will receive a survey in the mail or online from [employer] regarding your care with [place]. Please take a moment to fill this out. Your feedback is very important to [place] as you can help [place] better understand your patient needs as well as improve your experience and satisfaction. WE CARE ABOUT YOU!!!   We will check with your insurance regarding the Prolia and get in touch with you to make sure that it is okay to start these injections every 6 months Continue to drink plenty of fluids and in fact try to drink more water than usual You can still do your teaspoon of mustard  at night We will check your lab work and make sure there are no abnormalities which could be contributing to leg pain or cramps Continue to get your mammogram and pelvic exams as planned Stay up-to-date on eye exams  Arrie Senate MD

## 2018-03-25 NOTE — Patient Instructions (Addendum)
Medicare Annual Wellness Visit  [facility] and the medical providers at [facility] strive to bring you the best medical care.  In doing so we not only want to address your current medical conditions and concerns but also to detect new conditions early and prevent illness, disease and health-related problems.    Medicare offers a yearly Wellness Visit which allows our clinical staff to assess your need for preventative services including immunizations, lifestyle education, counseling to decrease risk of preventable diseases and screening for fall risk and other medical concerns.    This visit is provided free of charge (no copay) for all Medicare recipients. The clinical pharmacists at [facility] have begun to conduct these Wellness Visits which will also include a thorough review of all your medications.    As you primary medical provider recommend that you make an appointment for your Annual Wellness Visit if you have not done so already this year.  You may set up this appointment before you leave today or you may call back [phone] and schedule an appointment.  Please make sure when you call that you mention that you are scheduling your Annual Wellness Visit with the clinical pharmacist so that the appointment may be made for the proper length of time.     Continue current medications. Continue good therapeutic lifestyle changes which include good diet and exercise. Fall precautions discussed with patient. If an FOBT was given today- please return it to our front desk. If you are over 77 years old - you may need Prevnar 13 or the adult Pneumonia vaccine.  **Flu shots are available--- please call and schedule a FLU-CLINIC appointment**  After your visit with us today you will receive a survey in the mail or online from [employer] regarding your care with us. Please take a moment to fill this out. Your feedback is very  important to us as you can help us better understand your patient needs as well as improve your experience and satisfaction. WE CARE ABOUT YOU!!!   We will check with your insurance regarding the Prolia and get in touch with you to make sure that it is okay to start these injections every 6 months Continue to drink plenty of fluids and in fact try to drink more water than usual You can still do your teaspoon of mustard at night We will check your lab work and make sure there are no abnormalities which could be contributing to leg pain or cramps Continue to get your mammogram and pelvic exams as planned Stay up-to-date on eye exams

## 2018-03-26 LAB — CBC WITH DIFFERENTIAL/PLATELET
BASOS ABS: 0 10*3/uL (ref 0.0–0.2)
Basos: 1 %
EOS (ABSOLUTE): 0.1 10*3/uL (ref 0.0–0.4)
Eos: 3 %
HEMOGLOBIN: 13.3 g/dL (ref 11.1–15.9)
Hematocrit: 39.9 % (ref 34.0–46.6)
IMMATURE GRANS (ABS): 0 10*3/uL (ref 0.0–0.1)
IMMATURE GRANULOCYTES: 0 %
LYMPHS: 36 %
Lymphocytes Absolute: 1.7 10*3/uL (ref 0.7–3.1)
MCH: 31.5 pg (ref 26.6–33.0)
MCHC: 33.3 g/dL (ref 31.5–35.7)
MCV: 95 fL (ref 79–97)
MONOCYTES: 8 %
Monocytes Absolute: 0.4 10*3/uL (ref 0.1–0.9)
NEUTROS PCT: 52 %
Neutrophils Absolute: 2.5 10*3/uL (ref 1.4–7.0)
Platelets: 295 10*3/uL (ref 150–450)
RBC: 4.22 x10E6/uL (ref 3.77–5.28)
RDW: 13.3 % (ref 12.3–15.4)
WBC: 4.8 10*3/uL (ref 3.4–10.8)

## 2018-03-26 LAB — BMP8+EGFR
BUN/Creatinine Ratio: 24 (ref 12–28)
BUN: 17 mg/dL (ref 8–27)
CALCIUM: 9.8 mg/dL (ref 8.7–10.3)
CHLORIDE: 97 mmol/L (ref 96–106)
CO2: 26 mmol/L (ref 20–29)
Creatinine, Ser: 0.72 mg/dL (ref 0.57–1.00)
GFR calc Af Amer: 94 mL/min/{1.73_m2} (ref >59)
GFR calc non Af Amer: 82 mL/min/{1.73_m2} (ref >59)
GLUCOSE: 91 mg/dL (ref 65–99)
Potassium: 4.5 mmol/L (ref 3.5–5.2)
Sodium: 141 mmol/L (ref 134–144)

## 2018-03-26 LAB — LIPID PANEL
CHOLESTEROL TOTAL: 233 mg/dL — AB (ref 100–199)
Chol/HDL Ratio: 2.9 ratio (ref 0.0–4.4)
HDL: 80 mg/dL (ref >39)
LDL Calculated: 132 mg/dL — ABNORMAL HIGH (ref 0–99)
TRIGLYCERIDES: 107 mg/dL (ref 0–149)
VLDL Cholesterol Cal: 21 mg/dL (ref 5–40)

## 2018-03-26 LAB — HEPATIC FUNCTION PANEL
ALBUMIN: 4.4 g/dL (ref 3.5–4.8)
ALT: 12 IU/L (ref 0–32)
AST: 18 IU/L (ref 0–40)
Alkaline Phosphatase: 50 IU/L (ref 39–117)
BILIRUBIN TOTAL: 0.4 mg/dL (ref 0.0–1.2)
BILIRUBIN, DIRECT: 0.09 mg/dL (ref 0.00–0.40)
TOTAL PROTEIN: 6.6 g/dL (ref 6.0–8.5)

## 2018-03-26 LAB — VITAMIN B12: Vitamin B-12: 257 pg/mL (ref 232–1245)

## 2018-03-26 LAB — VITAMIN D 25 HYDROXY (VIT D DEFICIENCY, FRACTURES): VIT D 25 HYDROXY: 39.8 ng/mL (ref 30.0–100.0)

## 2018-05-23 ENCOUNTER — Other Ambulatory Visit: Payer: Self-pay | Admitting: Family Medicine

## 2018-07-29 ENCOUNTER — Ambulatory Visit: Payer: Medicare Other | Admitting: Family Medicine

## 2018-07-29 ENCOUNTER — Other Ambulatory Visit: Payer: Self-pay | Admitting: Family Medicine

## 2018-08-01 ENCOUNTER — Ambulatory Visit: Payer: Medicare Other | Admitting: Family Medicine

## 2018-08-01 NOTE — Telephone Encounter (Signed)
OV 08/08/18 

## 2018-08-03 ENCOUNTER — Ambulatory Visit (INDEPENDENT_AMBULATORY_CARE_PROVIDER_SITE_OTHER): Payer: Medicare Other | Admitting: Family Medicine

## 2018-08-03 ENCOUNTER — Encounter: Payer: Self-pay | Admitting: Family Medicine

## 2018-08-03 ENCOUNTER — Encounter: Payer: Self-pay | Admitting: *Deleted

## 2018-08-03 VITALS — BP 140/55 | HR 65 | Temp 97.2°F | Ht 60.0 in | Wt 119.0 lb

## 2018-08-03 DIAGNOSIS — I10 Essential (primary) hypertension: Secondary | ICD-10-CM | POA: Diagnosis not present

## 2018-08-03 DIAGNOSIS — I700 Atherosclerosis of aorta: Secondary | ICD-10-CM | POA: Diagnosis not present

## 2018-08-03 DIAGNOSIS — E7800 Pure hypercholesterolemia, unspecified: Secondary | ICD-10-CM

## 2018-08-03 DIAGNOSIS — E559 Vitamin D deficiency, unspecified: Secondary | ICD-10-CM

## 2018-08-03 DIAGNOSIS — G2581 Restless legs syndrome: Secondary | ICD-10-CM

## 2018-08-03 DIAGNOSIS — T148XXA Other injury of unspecified body region, initial encounter: Secondary | ICD-10-CM

## 2018-08-03 DIAGNOSIS — Z780 Asymptomatic menopausal state: Secondary | ICD-10-CM

## 2018-08-03 DIAGNOSIS — Z13820 Encounter for screening for osteoporosis: Secondary | ICD-10-CM

## 2018-08-03 DIAGNOSIS — H6121 Impacted cerumen, right ear: Secondary | ICD-10-CM

## 2018-08-03 DIAGNOSIS — H9193 Unspecified hearing loss, bilateral: Secondary | ICD-10-CM

## 2018-08-03 MED ORDER — MUPIROCIN 2 % EX OINT: 1.0000 "application " | TOPICAL_OINTMENT | Freq: Two times a day (BID) | CUTANEOUS | 0 refills | Status: DC

## 2018-08-03 MED ORDER — ENALAPRIL MALEATE 5 MG PO TABS: 5.0000 mg | ORAL_TABLET | Freq: Every day | ORAL | 3 refills | Status: DC

## 2018-08-03 MED ORDER — ZOLPIDEM TARTRATE 5 MG PO TABS: ORAL_TABLET | ORAL | 5 refills | Status: DC

## 2018-08-03 NOTE — Progress Notes (Signed)
Patient talked with the clinical pharmacist last year about starting Prolia. She had severe nausea and vomiting after each dose of risedronate sodium and was unable to tolerate it.   Dexa in 2018 showed a T score of -2.7. She is having one again today and we will wait and see what that result is.   Insurance verification submitted. Will review with patient once the summary of benefits is available.

## 2018-08-03 NOTE — Patient Instructions (Addendum)
Medicare Annual Wellness Visit  [facility] and the medical providers at [facility] strive to bring you the best medical care.  In doing so we not only want to address your current medical conditions and concerns but also to detect new conditions early and prevent illness, disease and health-related problems.    Medicare offers a yearly Wellness Visit which allows our clinical staff to assess your need for preventative services including immunizations, lifestyle education, counseling to decrease risk of preventable diseases and screening for fall risk and other medical concerns.    This visit is provided free of charge (no copay) for all Medicare recipients. The clinical pharmacists at [facility] have begun to conduct these Wellness Visits which will also include a thorough review of all your medications.    As you primary medical provider recommend that you make an appointment for your Annual Wellness Visit if you have not done so already this year.  You may set up this appointment before you leave today or you may call back [phone]) and schedule an appointment.  Please make sure when you call that you mention that you are scheduling your Annual Wellness Visit with the clinical pharmacist so that the appointment may be made for the proper length of time.   '  Continue current medications. Continue good therapeutic lifestyle changes which include good diet and exercise. Fall precautions discussed with patient. If an FOBT was given today- please return it to our front desk. If you are over 34 years old - you may need Prevnar 13 or the adult Pneumonia vaccine.  **Flu shots are available--- please call and schedule a FLU-CLINIC appointment**  After your visit with [place] today you will receive a survey in the mail or online from [employer] regarding your care with [place]. Please take a moment to fill this out. Your feedback is very  important to [place] as you can help [place] better understand your patient needs as well as improve your experience and satisfaction. WE CARE ABOUT YOU!!!  Use saline compresses on the arm and change this twice daily Use prescribed antibiotic ointment and apply this to cat scratches on legs at bedtime nightly and use saline during the evening before going to bed and protect the area during the day. The antibiotic is Bactroban. Debrox eardrops can help soften earwax and you would use 2 to 3 drops in each or affected ear canal nightly for 3 nights and repeat this in 1 week. Take Pepcid 20 twice daily before breakfast and supper instead of Zantac Use MiraLAX 1 scoop once daily along with the Colace.  As the bowel movements improve you can reduce the MiraLAX to possibly Monday Wednesday and Friday Continue to drink plenty of water and fluids We will call with lab work results as soon as those results become available

## 2018-08-03 NOTE — Progress Notes (Signed)
Subjective:    Patient ID: Dorothy Pitts, female    DOB: 11/10/1940, 77 y.o.   MRN: [id number]  HPI Pt here for follow up and management of chronic medical problems which includes hypertension and hyperlipidemia. She is taking medication regularly.  The patient is doing well but does have several concerns today.  She complains of hemorrhoids.  She needs a referral to the audiologist.  She has a right arm wound and is concerned about restless leg syndrome.  She is also having questions about Zantac.  She is requesting a refill on Vasotec and Ambien.  She will get a DEXA scan today will be given an FOBT to return and will get lab work today.  Her blood pressure today is 140/55 and weight is stable at 119.  This patient has a history of hyperlipidemia hypertension and hypokalemia along with irritable bowel syndrome osteo-porosis and thoracic aortic atherosclerosis.  She is taking Crestor and currently taking ranitidine which we will change.     Patient Active Problem List   Diagnosis Date Noted  . Ventral hernia 09/04/2016  . Pre-diabetes 11/28/2015  . Hyperlipidemia 08/28/2015  . Fall at home 06/24/2015  . Thoracic aortic atherosclerosis ([place]) 04/15/2015  . Osteoporosis 11/07/2014  . Hypokalemia 10/08/2013  . Anemia 10/08/2013  . Diverticulosis of colon (without mention of hemorrhage)   . Hypertension   . IBS (irritable bowel syndrome)    Outpatient Encounter Medications as of 08/03/2018  Medication Sig  . Ascorbic Acid (VITAMIN C) 1000 MG tablet Take 1,000 mg by mouth daily. Reported on 11/21/2015  . Calcium Carbonate-Vitamin D (CALTRATE 600+D) 600-400 MG-UNIT tablet Take 1 tablet by mouth daily.  . Cholecalciferol (VITAMIN D3) 5000 units CAPS Take 1 capsule by mouth every Monday, Wednesday, and Friday.  . enalapril (VASOTEC) 5 MG tablet TAKE 1 TABLET BY MOUTH EVERY DAY  . fluticasone (FLONASE) 50 MCG/ACT nasal spray Use one to two sprays in each nostril once daily as directed.  .  ranitidine (ZANTAC) 150 MG tablet Take 150 mg by mouth 2 (two) times daily.  [name] Kitchen spironolactone (ALDACTONE) 25 MG tablet Take 2 tablets (50 mg total) by mouth daily.  [name] Kitchen zolpidem (AMBIEN) 5 MG tablet TAKE 1 TABLET BY MOUTH EVERY DAY AT BEDTIME AS NEEDED  . rosuvastatin (CRESTOR) 5 MG tablet Take 0.5 tablets (2.5 mg total) by mouth every Monday, Wednesday, and Friday. (Patient not taking: Reported on 08/03/2018)  . [DISCONTINUED] zolpidem (AMBIEN) 5 MG tablet TAKE 1 TABLET BY MOUTH EVERY DAY AT BEDTIME AS NEEDED   No facility-administered encounter medications on file as of 08/03/2018.      Review of Systems  Constitutional: Negative.   HENT: Negative.   Eyes: Negative.   Respiratory: Negative.   Cardiovascular: Negative.   Gastrointestinal: Positive for rectal pain (hemorrhoids).  Endocrine: Negative.   Genitourinary: Negative.   Musculoskeletal: Negative.        Restless leg- 2 episodes  Skin: Positive for wound (right lower arm ).  Allergic/Immunologic: Negative.   Neurological: Negative.   Hematological: Negative.   Psychiatric/Behavioral: Negative.        Objective:   Physical Exam  Constitutional: She is oriented to person, place, and time. She appears well-developed and well-nourished. No distress.  The patient is pleasant and relaxed with several minor complaints today which I think we have addressed by the end of the visit.  HENT:  Head: Normocephalic and atraumatic.  Right Ear: External ear normal.  Left Ear: External ear normal.  Nose: Nose normal.  Mouth/Throat: Oropharynx is clear and moist. No oropharyngeal exudate.  Eyes: Pupils are equal, round, and reactive to light. Conjunctivae and EOM are normal. Right eye exhibits no discharge. Left eye exhibits no discharge. No scleral icterus.  Neck: Normal range of motion. Neck supple. No thyromegaly present.  No bruits thyromegaly or anterior cervical adenopathy  Cardiovascular: Normal rate, regular rhythm, normal heart  sounds and intact distal pulses.  No murmur heard. The heart regular at 60/min patient has pedal pulses but they are weaker on the right than the left.  There is no edema.  Pulmonary/Chest: Effort normal and breath sounds normal. She has no wheezes. She has no rales. She exhibits no tenderness.  Clear anteriorly and posteriorly  Abdominal: Soft. Bowel sounds are normal. She exhibits no mass. There is no tenderness.  Some gas but no liver or spleen enlargement masses or bruits.  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  Musculoskeletal: Normal range of motion. She exhibits no edema.  Lymphadenopathy:    She has no cervical adenopathy.  Neurological: She is alert and oriented to person, place, and time. She has normal reflexes. No cranial nerve deficit.  Reflexes are 2+ and equal bilaterally  Skin: Skin is warm and dry. No rash noted. There is erythema.  Healing skin tear on right forearm.  This area was cleansed and a saline soaked gauze was applied underneath the Telfa dressing. The cat scratch areas on her right lower leg were also cleansed and ointment was applied underneath a puckered up dressing to allow these areas to get more air.  Psychiatric: She has a normal mood and affect. Her behavior is normal. Judgment and thought content normal.  The patient's mood affect and behavior were all normal for her.  Nursing note and vitals reviewed.   BP (!) 140/55 (BP Location: Left Arm)   Pulse 65   Temp (!) 97.2 F (36.2 C) (Oral)   Ht 5' (1.524 m)   Wt 119 lb (54 kg)   BMI 23.24 kg/m        Assessment & Plan:  1. Essential hypertension -Continue with current treatment and watch sodium intake. - BMP8+EGFR - CBC with Differential/Platelet - Hepatic function panel  2. Thoracic aortic atherosclerosis ([facility]) -Restart low-dose Crestor at bedtime - CBC with Differential/Platelet - Lipid panel  3. Vitamin D deficiency -Continue vitamin D pending results of  lab work - CBC with Differential/Platelet - VITAMIN D 25 Hydroxy (Vit-D Deficiency, Fractures) - DG [facility] DEXA; Future  4. Pure hypercholesterolemia -Resume Crestor at only 1/2 pill once daily at bedtime which would be 2-1/2 mg.  If more bruising occurs patient will get back in touch with [place]. - CBC with Differential/Platelet - Lipid panel  5. Screening for osteoporosis - DG [facility] DEXA; Future  6. Postmenopausal - DG [facility] DEXA; Future  7. Decreased hearing of both ears - Ambulatory referral to Audiology  8. Restless leg - Thyroid Panel With TSH - Vitamin B12  9. Excessive cerumen in right ear canal -Debrox over-the-counter 2 to 3 drops in the affected ear canal nightly for 3 nights and wait and repeat this in 1 week.  10. Skin abrasion -Skin tear and skin abrasions.  The skin tears on her right forearm and patient has already removed the additional skin.  She will keep this covered with saline soaks.  The cat scratch areas on the right lower leg were cleansed and her tetanus immunization status  will be looked into.  Meds ordered this encounter  Medications  . zolpidem (AMBIEN) 5 MG tablet    Sig: TAKE 1 TABLET BY MOUTH EVERY DAY AT BEDTIME AS NEEDED    Dispense:  30 tablet    Refill:  5    Not to exceed 5 additional fills before 11/24/2019RX IS OUT OF REFILLS,.  . enalapril (VASOTEC) 5 MG tablet    Sig: Take 1 tablet (5 mg total) by mouth daily.    Dispense:  90 tablet    Refill:  3  . mupirocin ointment (BACTROBAN) 2 %    Sig: Apply 1 application topically 2 (two) times daily.    Dispense:  22 g    Refill:  0   Patient Instructions                       Medicare Annual Wellness Visit  [facility] and the medical providers at [facility] strive to bring you the best medical care.  In doing so we not only want to address your current medical conditions and concerns but also to detect new conditions early and prevent illness, disease and  health-related problems.    Medicare offers a yearly Wellness Visit which allows our clinical staff to assess your need for preventative services including immunizations, lifestyle education, counseling to decrease risk of preventable diseases and screening for fall risk and other medical concerns.    This visit is provided free of charge (no copay) for all Medicare recipients. The clinical pharmacists at [facility] have begun to conduct these Wellness Visits which will also include a thorough review of all your medications.    As you primary medical provider recommend that you make an appointment for your Annual Wellness Visit if you have not done so already this year.  You may set up this appointment before you leave today or you may call back ([phone]) and schedule an appointment.  Please make sure when you call that you mention that you are scheduling your Annual Wellness Visit with the clinical pharmacist so that the appointment may be made for the proper length of time.   '  Continue current medications. Continue good therapeutic lifestyle changes which include good diet and exercise. Fall precautions discussed with patient. If an FOBT was given today- please return it to our front desk. If you are over 8 years old - you may need Prevnar 69 or the adult Pneumonia vaccine.  **Flu shots are available--- please call and schedule a FLU-CLINIC appointment**  After your visit with [place] today you will receive a survey in the mail or online from [employer] regarding your care with [place]. Please take a moment to fill this out. Your feedback is very important to [place] as you can help [place] better understand your patient needs as well as improve your experience and satisfaction. WE CARE ABOUT YOU!!!  Use saline compresses on the arm and change this twice daily Use prescribed antibiotic ointment and apply this to cat scratches on legs at bedtime nightly and use saline during the evening  before going to bed and protect the area during the day. The antibiotic is Bactroban. Debrox eardrops can help soften earwax and you would use 2 to 3 drops in each or affected ear canal nightly for 3 nights and repeat this in 1 week. Take Pepcid 20 twice daily before breakfast and supper instead of Zantac Use MiraLAX 1 scoop once daily along  with the Colace.  As the bowel movements improve you can reduce the MiraLAX to possibly Monday Wednesday and Friday Continue to drink plenty of water and fluids We will call with lab work results as soon as those results become available   Arrie Senate MD

## 2018-08-04 LAB — LIPID PANEL
CHOL/HDL RATIO: 3.5 ratio (ref 0.0–4.4)
Cholesterol, Total: 262 mg/dL — ABNORMAL HIGH (ref 100–199)
HDL: 74 mg/dL (ref >39)
LDL Calculated: 156 mg/dL — ABNORMAL HIGH (ref 0–99)
Triglycerides: 160 mg/dL — ABNORMAL HIGH (ref 0–149)
VLDL Cholesterol Cal: 32 mg/dL (ref 5–40)

## 2018-08-04 LAB — BMP8+EGFR
BUN/Creatinine Ratio: 25 (ref 12–28)
BUN: 17 mg/dL (ref 8–27)
CO2: 26 mmol/L (ref 20–29)
CREATININE: 0.67 mg/dL (ref 0.57–1.00)
Calcium: 10 mg/dL (ref 8.7–10.3)
Chloride: 99 mmol/L (ref 96–106)
GFR calc Af Amer: 98 mL/min/{1.73_m2} (ref >59)
GFR calc non Af Amer: 85 mL/min/{1.73_m2} (ref >59)
GLUCOSE: 97 mg/dL (ref 65–99)
Potassium: 4.2 mmol/L (ref 3.5–5.2)
Sodium: 142 mmol/L (ref 134–144)

## 2018-08-04 LAB — CBC WITH DIFFERENTIAL/PLATELET
BASOS: 1 %
Basophils Absolute: 0 10*3/uL (ref 0.0–0.2)
EOS (ABSOLUTE): 0.1 10*3/uL (ref 0.0–0.4)
EOS: 1 %
HEMATOCRIT: 38.1 % (ref 34.0–46.6)
Hemoglobin: 12.9 g/dL (ref 11.1–15.9)
Immature Grans (Abs): 0 10*3/uL (ref 0.0–0.1)
Immature Granulocytes: 0 %
Lymphocytes Absolute: 1.3 10*3/uL (ref 0.7–3.1)
Lymphs: 30 %
MCH: 32.1 pg (ref 26.6–33.0)
MCHC: 33.9 g/dL (ref 31.5–35.7)
MCV: 95 fL (ref 79–97)
MONOS ABS: 0.4 10*3/uL (ref 0.1–0.9)
Monocytes: 8 %
Neutrophils Absolute: 2.5 10*3/uL (ref 1.4–7.0)
Neutrophils: 60 %
Platelets: 301 10*3/uL (ref 150–450)
RBC: 4.02 x10E6/uL (ref 3.77–5.28)
RDW: 11.8 % — AB (ref 12.3–15.4)
WBC: 4.3 10*3/uL (ref 3.4–10.8)

## 2018-08-04 LAB — HEPATIC FUNCTION PANEL
ALBUMIN: 4.3 g/dL (ref 3.5–4.8)
ALT: 9 IU/L (ref 0–32)
AST: 13 IU/L (ref 0–40)
Alkaline Phosphatase: 52 IU/L (ref 39–117)
Bilirubin Total: 0.3 mg/dL (ref 0.0–1.2)
Bilirubin, Direct: 0.08 mg/dL (ref 0.00–0.40)
TOTAL PROTEIN: 6.5 g/dL (ref 6.0–8.5)

## 2018-08-04 LAB — VITAMIN B12: VITAMIN B 12: 488 pg/mL (ref 232–1245)

## 2018-08-04 LAB — THYROID PANEL WITH TSH
Free Thyroxine Index: 1.7 (ref 1.2–4.9)
T3 UPTAKE RATIO: 25 % (ref 24–39)
T4 TOTAL: 6.6 ug/dL (ref 4.5–12.0)
TSH: 2.33 u[IU]/mL (ref 0.450–4.500)

## 2018-08-04 LAB — VITAMIN D 25 HYDROXY (VIT D DEFICIENCY, FRACTURES): Vit D, 25-Hydroxy: 40.4 ng/mL (ref 30.0–100.0)

## 2018-08-19 ENCOUNTER — Telehealth: Payer: Self-pay | Admitting: *Deleted

## 2018-08-19 NOTE — Telephone Encounter (Signed)
Erroneous encounter

## 2018-08-19 NOTE — Progress Notes (Signed)
Summary received. Insurance will cover at 100% if purchased through our office.  Patient will have the injection when she comes in for AWV next week.

## 2018-08-22 ENCOUNTER — Ambulatory Visit: Payer: Medicare Other

## 2018-08-22 ENCOUNTER — Encounter: Payer: Self-pay | Admitting: *Deleted

## 2018-08-22 ENCOUNTER — Ambulatory Visit (INDEPENDENT_AMBULATORY_CARE_PROVIDER_SITE_OTHER): Payer: Medicare Other | Admitting: *Deleted

## 2018-08-22 VITALS — BP 123/63 | HR 67 | Ht 60.0 in | Wt 121.0 lb

## 2018-08-22 DIAGNOSIS — Z0000 Encounter for general adult medical examination without abnormal findings: Secondary | ICD-10-CM

## 2018-08-22 NOTE — Patient Instructions (Addendum)
Please work on your goal of increasing your exercise to 3 times per week for 30 minutes each session.  Walking is a great option.   Please review the information given on Advance Directives, and if you complete these please bring a copy to our office to be filed in your medical record.   Please consider getting the vaccines you are eligible for - Tdap (tetanus, diptheria, and pertusis), Prevnar 13 (pneumonia), flu, and Shingrix (Shingles) vaccines in the future.    Thank you for coming in for your Annual Wellness Visit today!!   Preventive Care 77 Years and Older, Female Preventive care refers to lifestyle choices and visits with your health care provider that can promote health and wellness. What does preventive care include?  A yearly physical exam. This is also called an annual well check.  Dental exams once or twice a year.  Routine eye exams. Ask your health care provider how often you should have your eyes checked.  Personal lifestyle choices, including: ? Daily care of your teeth and gums. ? Regular physical activity. ? Eating a healthy diet. ? Avoiding tobacco and drug use. ? Limiting alcohol use. ? Practicing safe sex. ? Taking low-dose aspirin every day. ? Taking vitamin and mineral supplements as recommended by your health care provider. What happens during an annual well check? The services and screenings done by your health care provider during your annual well check will depend on your age, overall health, lifestyle risk factors, and family history of disease. Counseling Your health care provider may ask you questions about your:  Alcohol use.  Tobacco use.  Drug use.  Emotional well-being.  Home and relationship well-being.  Sexual activity.  Eating habits.  History of falls.  Memory and ability to understand (cognition).  Work and work Statistician.  Reproductive health.  Screening You may have the following tests or measurements:  Height,  weight, and BMI.  Blood pressure.  Lipid and cholesterol levels. These may be checked every 5 years, or more frequently if you are over 73 years old.  Skin check.  Lung cancer screening. You may have this screening every year starting at age 38 if you have a 30-pack-year history of smoking and currently smoke or have quit within the past 15 years.  Fecal occult blood test (FOBT) of the stool. You may have this test every year starting at age 22.  Flexible sigmoidoscopy or colonoscopy. You may have a sigmoidoscopy every 5 years or a colonoscopy every 10 years starting at age 48.  Hepatitis C blood test.  Hepatitis B blood test.  Sexually transmitted disease (STD) testing.  Diabetes screening. This is done by checking your blood sugar (glucose) after you have not eaten for a while (fasting). You may have this done every 1-3 years.  Bone density scan. This is done to screen for osteoporosis. You may have this done starting at age 69.  Mammogram. This may be done every 1-2 years. Talk to your health care provider about how often you should have regular mammograms.  Talk with your health care provider about your test results, treatment options, and if necessary, the need for more tests. Vaccines Your health care provider may recommend certain vaccines, such as:  Influenza vaccine. This is recommended every year.  Tetanus, diphtheria, and acellular pertussis (Tdap, Td) vaccine. You may need a Td booster every 10 years.  Varicella vaccine. You may need this if you have not been vaccinated.  Zoster vaccine. You may need this after  age 49.  Measles, mumps, and rubella (MMR) vaccine. You may need at least one dose of MMR if you were born in 1957 or later. You may also need a second dose.  Pneumococcal 13-valent conjugate (PCV13) vaccine. One dose is recommended after age 58.  Pneumococcal polysaccharide (PPSV23) vaccine. One dose is recommended after age 61.  Meningococcal vaccine.  You may need this if you have certain conditions.  Hepatitis A vaccine. You may need this if you have certain conditions or if you travel or work in places where you may be exposed to hepatitis A.  Hepatitis B vaccine. You may need this if you have certain conditions or if you travel or work in places where you may be exposed to hepatitis B.  Haemophilus influenzae type b (Hib) vaccine. You may need this if you have certain conditions.  Talk to your health care provider about which screenings and vaccines you need and how often you need them. This information is not intended to replace advice given to you by your health care provider. Make sure you discuss any questions you have with your health care provider. Document Released: 11/08/2015 Document Revised: 07/01/2016 Document Reviewed: 08/13/2015 Elsevier Interactive Patient Education  [employer].

## 2018-08-23 ENCOUNTER — Encounter: Payer: Self-pay | Admitting: *Deleted

## 2018-08-23 NOTE — Progress Notes (Signed)
Proceed with prolia injection and get Dexa at appropriate time

## 2018-08-23 NOTE — Progress Notes (Addendum)
Subjective:   Dorothy Pitts is a 77 y.o. female who presents for Medicare Annual (Subsequent) preventive examination.  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  She is widowed.  She has 3 children and 6 grandchildren.  She enjoys being active in her church, gardening club, service league, and playing golf.  Her adult son lives with her, and she has a cat.  Dorothy Pitts feels her health is unchanged from last year.  She reports no hospitalizations, ER visits, or surgeries in the last year.  She declines immunizations today because she is having an event at her home this weekend, and she does not want to chance not feeling well due to immunizations response.   Review of Systems:  All negative today        Objective:     Vitals: BP 123/63   Pulse 67   Ht 5' (1.524 m)   Wt 121 lb (54.9 kg)   BMI 23.63 kg/m   Body mass index is 23.63 kg/m.  Advanced Directives 08/23/2018 09/12/2016 09/04/2016 08/26/2016 11/21/2015 11/07/2014 12/01/2013  Does Patient Have a Medical Advance Directive? No No No No No No Patient does not have advance directive  Would patient like information on creating a medical advance directive? Yes (MAU/Ambulatory/Procedural Areas - Information given) No - patient declined information No - patient declined information Yes - Transport planner given Yes - Transport planner given Yes - Transport planner given -  Pre-existing out of facility DNR order (yellow form or pink MOST form) - - - - - - No    Tobacco Social History   Tobacco Use  Smoking Status Former Smoker  . Packs/day: 1.00  . Years: 35.00  . Pack years: 35.00  . Types: Cigarettes  . Last attempt to quit: 10/26/2002  . Years since quitting: 15.8  Smokeless Tobacco Never Used       Clinical Intake:     Pain Score: 0-No pain                 Past Medical History:  Diagnosis Date  . Acute diverticulitis 10/07/2013   With microperforation.  . Bowel obstruction  (HCC)   . Cataract   . Diverticulosis of colon (without mention of hemorrhage)   . GERD (gastroesophageal reflux disease)   . Hemorrhoids   . HOH (hard of hearing)   . Hyperlipidemia   . Hypertension   . IBS (irritable bowel syndrome)   . Osteopenia   . Osteoporosis   . PONV (postoperative nausea and vomiting)   . Thoracic aortic atherosclerosis (HCC)   . UTI (lower urinary tract infection) 10/07/2013  . Vitamin D deficiency    Past Surgical History:  Procedure Laterality Date  . ABDOMINAL HYSTERECTOMY    . APPENDECTOMY    . BILATERAL OOPHORECTOMY    . BREAST LUMPECTOMY Left   . CATARACT EXTRACTION    . COLON RESECTION N/A 12/01/2013   Procedure: HAND ASSISTED LAPAROSCOPIC PARTIAL COLECTOMY;  Surgeon: Dalia Heading, MD;  Location: [facility];  Service: General;  Laterality: N/A;  . EYE SURGERY    . HEMORRHOID SURGERY    . HERNIA REPAIR  09/04/2016   LAPAROSCOPIC VENTRAL INCISIONAL HERNIA REPAIR POSSIBLE OPEN (N/A)  . INCISIONAL HERNIA REPAIR N/A 09/04/2016   Procedure: LAPAROSCOPIC VENTRAL INCISIONAL HERNIA REPAIR POSSIBLE OPEN;  Surgeon: Ovidio Kin, MD;  Location: [facility];  Service: General;  Laterality: N/A;  . INSERTION OF MESH N/A 09/04/2016   Procedure: INSERTION OF MESH;  Surgeon: Ovidio Kin, MD;  Location: [facility] OR;  Service: General;  Laterality: N/A;  . PARTIAL HYSTERECTOMY    . TONSILLECTOMY     Family History  Problem Relation Age of Onset  . Hypertension Mother   . Heart attack Mother 58  . Parkinson's disease Father   . Heart disease Brother 45       bypass  . Obesity Brother   . Colon cancer Neg Hx    Social History   Socioeconomic History  . Marital status: Widowed    Spouse name: Not on file  . Number of children: 3  . Years of education: Not on file  . Highest education level: Not on file  Occupational History  . Occupation: retired    Associate Professor: RETIRED  Social Needs  . Financial resource strain: Not on file  . Food insecurity:    Worry: Not on  file    Inability: Not on file  . Transportation needs:    Medical: Not on file    Non-medical: Not on file  Tobacco Use  . Smoking status: Former Smoker    Packs/day: 1.00    Years: 35.00    Pack years: 35.00    Types: Cigarettes    Last attempt to quit: 10/26/2002    Years since quitting: 15.8  . Smokeless tobacco: Never Used  Substance and Sexual Activity  . Alcohol use: Yes    Alcohol/week: 5.0 standard drinks    Types: 5 Glasses of wine per week    Comment: wine  . Drug use: No  . Sexual activity: Never  Lifestyle  . Physical activity:    Days per week: Not on file    Minutes per session: Not on file  . Stress: Not on file  Relationships  . Social connections:    Talks on phone: Not on file    Gets together: Not on file    Attends religious service: Not on file    Active member of club or organization: Not on file    Attends meetings of clubs or organizations: Not on file    Relationship status: Not on file  Other Topics Concern  . Not on file  Social History Narrative  . Not on file    Outpatient Encounter Medications as of 08/22/2018  Medication Sig  . Ascorbic Acid (VITAMIN C) 1000 MG tablet Take 1,000 mg by mouth daily. Reported on 11/21/2015  . Calcium Carbonate-Vitamin D (CALTRATE 600+D) 600-400 MG-UNIT tablet Take 1 tablet by mouth daily.  . Cholecalciferol (VITAMIN D3) 5000 units CAPS Take 1 capsule by mouth every Monday, Wednesday, and Friday.  . enalapril (VASOTEC) 5 MG tablet Take 1 tablet (5 mg total) by mouth daily.  . fluticasone (FLONASE) 50 MCG/ACT nasal spray Use one to two sprays in each nostril once daily as directed.  . mupirocin ointment (BACTROBAN) 2 % Apply 1 application topically 2 (two) times daily.  . ranitidine (ZANTAC) 150 MG tablet Take 150 mg by mouth 2 (two) times daily.  . rosuvastatin (CRESTOR) 5 MG tablet Take 0.5 tablets (2.5 mg total) by mouth every Monday, Wednesday, and Friday. (Patient not taking: Reported on 08/03/2018)  .  spironolactone (ALDACTONE) 25 MG tablet Take 2 tablets (50 mg total) by mouth daily.  [name] Kitchen zolpidem (AMBIEN) 5 MG tablet TAKE 1 TABLET BY MOUTH EVERY DAY AT BEDTIME AS NEEDED   No facility-administered encounter medications on file as of 08/22/2018.     Activities of Daily Living In your present state  of health, do you have any difficulty performing the following activities: 08/22/2018  Hearing? Y  Comment Hearing aids in both ears, followed by Dr. Jac Canavan  Vision? N  Difficulty concentrating or making decisions? N  Walking or climbing stairs? N  Dressing or bathing? N  Doing errands, shopping? N  Preparing Food and eating ? N  Using the Toilet? N  In the past six months, have you accidently leaked urine? N  Do you have problems with loss of bowel control? N  Managing your Medications? N  Managing your Finances? N  Housekeeping or managing your Housekeeping? N  Comment Has someone that helps her clean her home  Some recent data might be hidden    Patient Care Team: Ernestina Penna, MD as PCP - General (Family Medicine) Pyrtle, Carie Caddy, MD as Consulting Physician (Gastroenterology) Ovidio Kin, MD as Consulting Physician (General Surgery) Annamaria Helling, MD as Consulting Physician (Obstetrics and Gynecology)    Assessment:   This is a routine wellness examination for [place].  Exercise Activities and Dietary recommendations  Patient states she eats 3 balanced meals per day.  She avoids gluten and fried foods.  She eats mostly fruits, vegetables, and lean proteins.   She plays golf about once per week.  Current Exercise Habits: The patient does not participate in regular exercise at present  Goals    . Exercise 3x per week (30 min per time)     Walking is a great option.        Fall Risk Fall Risk  08/23/2018 08/03/2018 03/25/2018 12/20/2017 06/22/2017  Falls in the past year? No No No No No  Number falls in past yr: - - - - -  Injury with Fall? - - - - -   Is the patient's  home free of loose throw rugs in walkways, pet beds, electrical cords, etc?   yes      Grab bars in the bathroom? yes      Handrails on the stairs?   yes      Adequate lighting?   yes    Depression Screen PHQ 2/9 Scores 08/23/2018 08/03/2018 03/25/2018 12/20/2017  PHQ - 2 Score 0 0 0 0     Cognitive Function MMSE - Mini Mental State Exam 08/23/2018 11/21/2015  Orientation to time 5 5  Orientation to Place 5 5  Registration 3 3  Attention/ Calculation 5 5  Recall 3 3  Language- name 2 objects 2 2  Language- repeat 1 1  Language- follow 3 step command 3 3  Language- read & follow direction 1 1  Write a sentence 1 1  Copy design 1 1  Total score 30 30        Immunization History  Administered Date(s) Administered  . Td 04/19/2006  . Zoster 12/24/2009    Qualifies for Shingles Vaccine? Declined today  Screening Tests Health Maintenance  Topic Date Due  . INFLUENZA VACCINE  09/20/2018 (Originally 05/26/2018)  . TETANUS/TDAP  03/26/2019 (Originally 04/19/2016)  . PNA vac Low Risk Adult (1 of 2 - PCV13) 09/28/2021 (Originally 07/09/2006)  . DEXA SCAN  02/13/2019  . MAMMOGRAM  03/29/2019   Tdap, Prevnar 13, and flu vaccines declined today   Cancer Screenings: Lung: Low Dose CT Chest recommended if Age 61-80 years, 30 pack-year currently smoking OR have quit w/in 15years. Patient does qualify. Breast:  Up to date on Mammogram? Yes   Up to date of Bone Density/Dexa? Yes Colorectal: up to date  Additional  Screenings:  Hepatitis C Screening:  Not indicated      Plan:     Work on your goal of increasing your exercise to 3 times per week for 30 minutes each session.  Walking is a great option.  Review the information given on Advance Directives, and if you complete these please bring a copy to our office to be filed in your medical record.  Consider getting the vaccines you are eligible for - Tdap (tetanus, diptheria, and pertusis), Prevnar 13 (pneumonia), flu, and Shingrix  (Shingles) vaccines in the future  I have personally reviewed and noted the following in the patient's chart:   . Medical and social history . Use of alcohol, tobacco or illicit drugs  . Current medications and supplements . Functional ability and status . Nutritional status . Physical activity . Advanced directives . List of other physicians . Hospitalizations, surgeries, and ER visits in previous 12 months . Vitals . Screenings to include cognitive, depression, and falls . Referrals and appointments  In addition, I have reviewed and discussed with patient certain preventive protocols, quality metrics, and best practice recommendations. A written personalized care plan for preventive services as well as general preventive health recommendations were provided to patient.     Seichi Kaufhold M, RN  08/23/2018  I have reviewed and agree with the above AWV documentation.   Mary-Margaret Daphine Deutscher, FNP

## 2018-08-23 NOTE — Progress Notes (Signed)
Patient notified that Dr. Christell Constant recommends proceeding with Prolia injection.  Scheduled patient for Prolia injection 08/28/18.

## 2018-08-23 NOTE — Progress Notes (Signed)
Ms. Hardiman is in today for her AWV.  She states she thinks she is supposed to get a Prolia injection.  In last office note from Dr. Christell Constant 08/03/18 he noted that she was supposed to have a dexa scan that day, but she did not.  She is not due for dexa scan until 02/13/19.  Note to Dr. Christell Constant to advise if we should proceed with prolia injection?  She was approved for injection by insurance.

## 2018-08-29 ENCOUNTER — Ambulatory Visit (INDEPENDENT_AMBULATORY_CARE_PROVIDER_SITE_OTHER): Payer: Medicare Other | Admitting: *Deleted

## 2018-08-29 DIAGNOSIS — M810 Age-related osteoporosis without current pathological fracture: Secondary | ICD-10-CM | POA: Diagnosis not present

## 2018-08-29 MED ORDER — DENOSUMAB 60 MG/ML ~~LOC~~ SOSY
60.0000 mg | PREFILLED_SYRINGE | SUBCUTANEOUS | Status: AC
  Administered 2018-08-29 – 2019-03-01 (×2): 60 mg via SUBCUTANEOUS

## 2018-08-29 NOTE — Progress Notes (Signed)
Pt given Prolia inj Tolerated well 

## 2018-12-12 ENCOUNTER — Other Ambulatory Visit: Payer: Self-pay | Admitting: Family Medicine

## 2018-12-14 ENCOUNTER — Encounter: Payer: Self-pay | Admitting: Family Medicine

## 2018-12-14 ENCOUNTER — Ambulatory Visit (INDEPENDENT_AMBULATORY_CARE_PROVIDER_SITE_OTHER): Payer: Medicare Other | Admitting: Family Medicine

## 2018-12-14 ENCOUNTER — Ambulatory Visit (INDEPENDENT_AMBULATORY_CARE_PROVIDER_SITE_OTHER): Payer: Medicare Other

## 2018-12-14 VITALS — BP 137/58 | HR 72 | Temp 97.2°F | Ht 60.0 in | Wt 122.0 lb

## 2018-12-14 DIAGNOSIS — E7800 Pure hypercholesterolemia, unspecified: Secondary | ICD-10-CM

## 2018-12-14 DIAGNOSIS — I10 Essential (primary) hypertension: Secondary | ICD-10-CM

## 2018-12-14 DIAGNOSIS — L853 Xerosis cutis: Secondary | ICD-10-CM

## 2018-12-14 DIAGNOSIS — E559 Vitamin D deficiency, unspecified: Secondary | ICD-10-CM | POA: Diagnosis not present

## 2018-12-14 DIAGNOSIS — M810 Age-related osteoporosis without current pathological fracture: Secondary | ICD-10-CM

## 2018-12-14 DIAGNOSIS — I700 Atherosclerosis of aorta: Secondary | ICD-10-CM | POA: Diagnosis not present

## 2018-12-14 DIAGNOSIS — H9190 Unspecified hearing loss, unspecified ear: Secondary | ICD-10-CM

## 2018-12-14 MED ORDER — KETOCONAZOLE 2 % EX CREA: 1.0000 "application " | TOPICAL_CREAM | Freq: Every day | CUTANEOUS | 3 refills | Status: DC

## 2018-12-14 MED ORDER — TRIAMCINOLONE ACETONIDE 0.1 % EX CREA: 1.0000 "application " | TOPICAL_CREAM | Freq: Two times a day (BID) | CUTANEOUS | 3 refills | Status: DC

## 2018-12-14 NOTE — Patient Instructions (Addendum)
Medicare Annual Wellness Visit  [place] and the medical providers at [facility] strive to bring you the best medical care.  In doing so we not only want to address your current medical conditions and concerns but also to detect new conditions early and prevent illness, disease and health-related problems.    Medicare offers a yearly Wellness Visit which allows our clinical staff to assess your need for preventative services including immunizations, lifestyle education, counseling to decrease risk of preventable diseases and screening for fall risk and other medical concerns.    This visit is provided free of charge (no copay) for all Medicare recipients. The clinical pharmacists at [facility] have begun to conduct these Wellness Visits which will also include a thorough review of all your medications.    As you primary medical provider recommend that you make an appointment for your Annual Wellness Visit if you have not done so already this year.  You may set up this appointment before you leave today or you may call back ([phone]) and schedule an appointment.  Please make sure when you call that you mention that you are scheduling your Annual Wellness Visit with the clinical pharmacist so that the appointment may be made for the proper length of time.     Continue current medications. Continue good therapeutic lifestyle changes which include good diet and exercise. Fall precautions discussed with patient. If an FOBT was given today- please return it to our front desk. If you are over 8 years old - you may need Prevnar 13 or the adult Pneumonia vaccine.  **Flu shots are available--- please call and schedule a FLU-CLINIC appointment**  After your visit with [place] today you will receive a survey in the mail or online from [employer] regarding your care with [place]. Please take a moment to fill this out. Your feedback is very  important to [place] as you can help [place] better understand your patient needs as well as improve your experience and satisfaction. WE CARE ABOUT YOU!!!   Avoid soaps fabric softeners and detergents that are scented Use moisturizers like Moisturel Eucerin on hands Try some cortisone 10 periodically Use Vaseline on thumb skin at bedtime Good hand hygiene Avoid Neosporin in the future, only use Polysporin Use warm wet compresses to anterior left shoulder and if pain continues let us know we will consider doing an injection Take Pepcid AC twice daily on a regular basis before breakfast and supper at least for 1 month and then try to reduce again to once daily.

## 2018-12-14 NOTE — Progress Notes (Signed)
Subjective:    Patient ID: Dorothy Pitts, female    DOB: May 28, 1941, 78 y.o.   MRN: [id number]  HPI  Pt here for follow up and management of chronic medical problems which includes hypertension and hyperlipidemia. She is taking medication regularly.  The patient is due to get a chest x-ray return in FOBT and get lab work today.  She is concerned about her fingertips which are dry and cracking.  She is also having some left shoulder pain and wants to discuss Pepcid AC.  Her vital signs are stable and her body mass index is 23.63.  Patient has a history of cataracts GERD hyperlipidemia hypertension and osteoporosis.  She also has inhalant allergies.  She also has atherosclerosis of the aorta.  She has IBS and diverticulosis and has had a bout of acute diverticulitis.  She had a bowel obstruction.  Her atherosclerosis is in the thoracic aorta.  The patient today also complains of left shoulder pain in the anterior shoulder.  Patient today denies any chest pain pressure tightness or shortness of breath.  She does have the reflux and she will take Pepcid twice a day for this she is passing her water without problems.   Patient Active Problem List   Diagnosis Date Noted  . Ventral hernia 09/04/2016  . Pre-diabetes 11/28/2015  . Hyperlipidemia 08/28/2015  . Fall at home 06/24/2015  . Thoracic aortic atherosclerosis ([place]) 04/15/2015  . Osteoporosis 11/07/2014  . Hypokalemia 10/08/2013  . Anemia 10/08/2013  . Diverticulosis of colon (without mention of hemorrhage)   . Hypertension   . IBS (irritable bowel syndrome)    Outpatient Encounter Medications as of 12/14/2018  Medication Sig  . Ascorbic Acid (VITAMIN C) 1000 MG tablet Take 1,000 mg by mouth daily. Reported on 11/21/2015  . Calcium Carbonate-Vitamin D (CALTRATE 600+D) 600-400 MG-UNIT tablet Take 1 tablet by mouth daily.  . Cholecalciferol (VITAMIN D3) 5000 units CAPS Take 1 capsule by mouth every Monday, Wednesday, and Friday.  .  enalapril (VASOTEC) 5 MG tablet Take 1 tablet (5 mg total) by mouth daily.  . fluticasone (FLONASE) 50 MCG/ACT nasal spray Use one to two sprays in each nostril once daily as directed.  . ranitidine (ZANTAC) 150 MG tablet Take 150 mg by mouth 2 (two) times daily.  . rosuvastatin (CRESTOR) 5 MG tablet TAKE 0.5 TABLETS (2.5 MG TOTAL) BY MOUTH EVERY MONDAY, WEDNESDAY, AND FRIDAY.  [name] Kitchen spironolactone (ALDACTONE) 25 MG tablet Take 2 tablets (50 mg total) by mouth daily.  [name] Kitchen zolpidem (AMBIEN) 5 MG tablet TAKE 1 TABLET BY MOUTH EVERY DAY AT BEDTIME AS NEEDED  . [DISCONTINUED] mupirocin ointment (BACTROBAN) 2 % Apply 1 application topically 2 (two) times daily.   Facility-Administered Encounter Medications as of 12/14/2018  Medication  . denosumab (PROLIA) injection 60 mg     Review of Systems  Constitutional: Negative.   HENT: Negative.   Eyes: Negative.   Respiratory: Negative.   Cardiovascular: Negative.   Gastrointestinal: Negative.   Endocrine: Negative.   Genitourinary: Negative.   Musculoskeletal: Positive for arthralgias (left shoulder pain ).  Skin: Negative.        Dry- cracking around fingertips   Allergic/Immunologic: Negative.   Neurological: Negative.   Hematological: Negative.   Psychiatric/Behavioral: Negative.        Objective:   Physical Exam Vitals signs and nursing note reviewed.  Constitutional:      Appearance: Normal appearance. She is well-developed and normal weight. She is not ill-appearing.  Comments: The patient is pleasant and alert and concerned that she does not hear as well as she used to with her hearing aids.  HENT:     Head: Normocephalic and atraumatic.     Right Ear: Tympanic membrane, ear canal and external ear normal. There is no impacted cerumen.     Left Ear: Tympanic membrane, ear canal and external ear normal. There is no impacted cerumen.     Nose: Nose normal. No congestion.     Mouth/Throat:     Mouth: Mucous membranes are moist.      Pharynx: Oropharynx is clear. No oropharyngeal exudate.  Eyes:     General: No scleral icterus.       Right eye: No discharge.        Left eye: No discharge.     Extraocular Movements: Extraocular movements intact.     Conjunctiva/sclera: Conjunctivae normal.     Pupils: Pupils are equal, round, and reactive to light.  Neck:     Musculoskeletal: Normal range of motion and neck supple.     Thyroid: No thyromegaly.     Vascular: No carotid bruit or JVD.  Cardiovascular:     Rate and Rhythm: Normal rate and regular rhythm.     Pulses: Normal pulses.     Heart sounds: Normal heart sounds. No murmur. No gallop.      Comments: Heart is regular at 72/min with good pedal pulses and no edema some varicosities present. Pulmonary:     Effort: Pulmonary effort is normal.     Breath sounds: Normal breath sounds. No wheezing or rales.     Comments: Clear anteriorly and posteriorly Abdominal:     General: Abdomen is flat. Bowel sounds are normal.     Palpations: Abdomen is soft. There is no mass.     Tenderness: There is no abdominal tenderness.     Comments: Surgical scars secondary to obstruction issues in the past but no tenderness masses or bruits today.  Musculoskeletal: Normal range of motion.        General: No tenderness.     Right lower leg: No edema.     Left lower leg: No edema.     Comments: Slight tenderness at acromioclavicular joint on the left.  Lymphadenopathy:     Cervical: No cervical adenopathy.  Skin:    General: Skin is warm and dry.     Findings: No rash.  Neurological:     General: No focal deficit present.     Mental Status: She is alert and oriented to person, place, and time. Mental status is at baseline.     Cranial Nerves: No cranial nerve deficit.     Gait: Gait normal.     Deep Tendon Reflexes: Reflexes are normal and symmetric. Reflexes normal.  Psychiatric:        Mood and Affect: Mood normal.        Behavior: Behavior normal.        Thought Content:  Thought content normal.        Judgment: Judgment normal.     Comments: Mood affect and behavior are all normal for this patient.    BP (!) 137/58 (BP Location: Left Arm)   Pulse 72   Temp (!) 97.2 F (36.2 C) (Oral)   Ht 5' (1.524 m)   Wt 122 lb (55.3 kg)   BMI 23.83 kg/m         Assessment & Plan:  1. Essential hypertension -Blood pressure is  good today and she will continue with current treatment and sodium restriction - DG Chest 2 View; Future - BMP8+EGFR - CBC with Differential/Platelet - Hepatic function panel  2. Vitamin D deficiency -Continue with vitamin D replacement and taking calcium twice daily - CBC with Differential/Platelet - VITAMIN D 25 Hydroxy (Vit-D Deficiency, Fractures)  3. Thoracic aortic atherosclerosis ([place]) -Continue with aggressive therapeutic lifestyle changes and current low-dose Crestor - DG Chest 2 View; Future - CBC with Differential/Platelet - Lipid panel  4. Pure hypercholesterolemia -Continue with current treatment and therapeutic lifestyle changes - DG Chest 2 View; Future - CBC with Differential/Platelet - Lipid panel  5. Dry skin dermatitis -Use moisturizers as directed avoid Neosporin  6. Age-related osteoporosis without current pathological fracture -Get Prolia shot if needed today  7. Decreased hearing, unspecified laterality -Reviewed hearing aid maintenance with audiologist  Meds ordered this encounter  Medications  . triamcinolone cream (KENALOG) 0.1 %    Sig: Apply 1 application topically 2 (two) times daily.    Dispense:  15 g    Refill:  3  . ketoconazole (NIZORAL) 2 % cream    Sig: Apply 1 application topically daily.    Dispense:  30 g    Refill:  3   Patient Instructions                       Medicare Annual Wellness Visit  [place] and the medical providers at [place] strive to bring you the best medical care.  In doing so we not only want to address your current medical  conditions and concerns but also to detect new conditions early and prevent illness, disease and health-related problems.    Medicare offers a yearly Wellness Visit which allows our clinical staff to assess your need for preventative services including immunizations, lifestyle education, counseling to decrease risk of preventable diseases and screening for fall risk and other medical concerns.    This visit is provided free of charge (no copay) for all Medicare recipients. The clinical pharmacists at [facility] have begun to conduct these Wellness Visits which will also include a thorough review of all your medications.    As you primary medical provider recommend that you make an appointment for your Annual Wellness Visit if you have not done so already this year.  You may set up this appointment before you leave today or you may call back ([phone]) and schedule an appointment.  Please make sure when you call that you mention that you are scheduling your Annual Wellness Visit with the clinical pharmacist so that the appointment may be made for the proper length of time.     Continue current medications. Continue good therapeutic lifestyle changes which include good diet and exercise. Fall precautions discussed with patient. If an FOBT was given today- please return it to our front desk. If you are over 1 years old - you may need Prevnar 10 or the adult Pneumonia vaccine.  **Flu shots are available--- please call and schedule a FLU-CLINIC appointment**  After your visit with [place] today you will receive a survey in the mail or online from [employer] regarding your care with [place]. Please take a moment to fill this out. Your feedback is very important to [place] as you can help [place] better understand your patient needs as well as improve your experience and satisfaction. WE CARE ABOUT YOU!!!   Avoid soaps fabric softeners and detergents that are  scented Use moisturizers like  Moisturel Eucerin on hands Try some cortisone 10 periodically Use Vaseline on thumb skin at bedtime Good hand hygiene Avoid Neosporin in the future, only use Polysporin Use warm wet compresses to anterior left shoulder and if pain continues let us know we will consider doing an injection Take Pepcid AC twice daily on a regular basis before breakfast and supper at least for 1 month and then try to reduce again to once daily.  Arrie Senate MD

## 2018-12-15 LAB — HEPATIC FUNCTION PANEL
ALT: 14 IU/L (ref 0–32)
AST: 17 IU/L (ref 0–40)
Albumin: 4.4 g/dL (ref 3.7–4.7)
Alkaline Phosphatase: 45 IU/L (ref 39–117)
Bilirubin Total: 0.4 mg/dL (ref 0.0–1.2)
Bilirubin, Direct: 0.11 mg/dL (ref 0.00–0.40)
Total Protein: 6.5 g/dL (ref 6.0–8.5)

## 2018-12-15 LAB — BMP8+EGFR
BUN/Creatinine Ratio: 22 (ref 12–28)
BUN: 14 mg/dL (ref 8–27)
CO2: 24 mmol/L (ref 20–29)
Calcium: 9.9 mg/dL (ref 8.7–10.3)
Chloride: 100 mmol/L (ref 96–106)
Creatinine, Ser: 0.63 mg/dL (ref 0.57–1.00)
GFR calc Af Amer: 100 mL/min/{1.73_m2} (ref >59)
GFR calc non Af Amer: 87 mL/min/{1.73_m2} (ref >59)
GLUCOSE: 105 mg/dL — AB (ref 65–99)
Potassium: 4.3 mmol/L (ref 3.5–5.2)
Sodium: 139 mmol/L (ref 134–144)

## 2018-12-15 LAB — CBC WITH DIFFERENTIAL/PLATELET
Basophils Absolute: 0 10*3/uL (ref 0.0–0.2)
Basos: 1 %
EOS (ABSOLUTE): 0.1 10*3/uL (ref 0.0–0.4)
Eos: 3 %
Hematocrit: 37.4 % (ref 34.0–46.6)
Hemoglobin: 12.9 g/dL (ref 11.1–15.9)
Immature Grans (Abs): 0 10*3/uL (ref 0.0–0.1)
Immature Granulocytes: 0 %
Lymphocytes Absolute: 1.5 10*3/uL (ref 0.7–3.1)
Lymphs: 34 %
MCH: 31.9 pg (ref 26.6–33.0)
MCHC: 34.5 g/dL (ref 31.5–35.7)
MCV: 92 fL (ref 79–97)
Monocytes Absolute: 0.4 10*3/uL (ref 0.1–0.9)
Monocytes: 10 %
Neutrophils Absolute: 2.2 10*3/uL (ref 1.4–7.0)
Neutrophils: 52 %
Platelets: 263 10*3/uL (ref 150–450)
RBC: 4.05 x10E6/uL (ref 3.77–5.28)
RDW: 11.9 % (ref 11.7–15.4)
WBC: 4.2 10*3/uL (ref 3.4–10.8)

## 2018-12-15 LAB — LIPID PANEL
Chol/HDL Ratio: 3.4 ratio (ref 0.0–4.4)
Cholesterol, Total: 252 mg/dL — ABNORMAL HIGH (ref 100–199)
HDL: 75 mg/dL (ref >39)
LDL Calculated: 152 mg/dL — ABNORMAL HIGH (ref 0–99)
TRIGLYCERIDES: 124 mg/dL (ref 0–149)
VLDL Cholesterol Cal: 25 mg/dL (ref 5–40)

## 2018-12-15 LAB — VITAMIN D 25 HYDROXY (VIT D DEFICIENCY, FRACTURES): Vit D, 25-Hydroxy: 34.8 ng/mL (ref 30.0–100.0)

## 2019-02-01 ENCOUNTER — Telehealth: Payer: Self-pay | Admitting: *Deleted

## 2019-02-01 NOTE — Telephone Encounter (Signed)
PROLIA: Summary of Benefits Interpretation  February 01, 2019     Purchase Information  Last purchase location: buy and [employer] . Primary: [place] Medicare  Secondary:   Summary of Benefits  . Received on: 01/31/2019 . Estimated Cost to Patient: $0 . Prior authorization required: No   . Physician Purchase Covered: Yes  . Specialty Pharmacy Covered:No  o Pharmacy Name:   Patient notified of coverage information.  Scheduled for 02/28/2019.   Lindie Spruce, AMY M   02/01/2019 [facility] [phone]

## 2019-02-24 ENCOUNTER — Other Ambulatory Visit: Payer: Self-pay | Admitting: Family Medicine

## 2019-02-28 ENCOUNTER — Ambulatory Visit: Payer: Self-pay

## 2019-03-01 ENCOUNTER — Ambulatory Visit (INDEPENDENT_AMBULATORY_CARE_PROVIDER_SITE_OTHER): Payer: Medicare Other | Admitting: *Deleted

## 2019-03-01 ENCOUNTER — Other Ambulatory Visit: Payer: Self-pay

## 2019-03-01 DIAGNOSIS — M810 Age-related osteoporosis without current pathological fracture: Secondary | ICD-10-CM | POA: Diagnosis not present

## 2019-03-01 NOTE — Progress Notes (Signed)
Pt given Prolia inj Buy and bill Pt tolerated well 

## 2019-03-02 ENCOUNTER — Telehealth: Payer: Self-pay | Admitting: Family Medicine

## 2019-03-02 NOTE — Telephone Encounter (Signed)
PT states that pharmacy is telling her that they don't have the ambien rx we sent on Monday. Pt would like for [place] to resend and call her on her cell once this has been resent.

## 2019-03-02 NOTE — Telephone Encounter (Signed)
Called [facility] and they said her insurance denied it saying it was only covered for 90 days within a year so not eligible for coverage until Jan 2021. [facility] put the rx on a savings card and it would cost pt $33.61 for 30 day supply which pt agreed to and will go pick up at pharmacy.

## 2019-03-16 ENCOUNTER — Other Ambulatory Visit: Payer: Self-pay | Admitting: Family Medicine

## 2019-04-03 ENCOUNTER — Other Ambulatory Visit: Payer: Self-pay | Admitting: Family Medicine

## 2019-04-05 ENCOUNTER — Other Ambulatory Visit: Payer: Self-pay

## 2019-04-06 ENCOUNTER — Other Ambulatory Visit: Payer: Self-pay | Admitting: Family Medicine

## 2019-04-07 ENCOUNTER — Other Ambulatory Visit: Payer: Self-pay

## 2019-04-10 ENCOUNTER — Ambulatory Visit (INDEPENDENT_AMBULATORY_CARE_PROVIDER_SITE_OTHER): Payer: Medicare Other | Admitting: Family Medicine

## 2019-04-10 ENCOUNTER — Encounter: Payer: Self-pay | Admitting: Family Medicine

## 2019-04-10 ENCOUNTER — Other Ambulatory Visit: Payer: Self-pay

## 2019-04-10 VITALS — BP 127/59 | HR 72 | Temp 98.1°F | Ht 60.0 in | Wt 124.6 lb

## 2019-04-10 DIAGNOSIS — R1031 Right lower quadrant pain: Secondary | ICD-10-CM | POA: Diagnosis not present

## 2019-04-10 DIAGNOSIS — R1900 Intra-abdominal and pelvic swelling, mass and lump, unspecified site: Secondary | ICD-10-CM

## 2019-04-10 NOTE — Progress Notes (Signed)
Subjective:  Patient ID: Dorothy Pitts, female    DOB: 1941/01/12, 78 y.o.   MRN: [id number]  Chief Complaint:  Groin Pain (right side )   HPI: Dorothy Pitts is a 78 y.o. female presenting on 04/10/2019 for Groin Pain (right side )  Pt presents today for right groin pain. States this started several weeks ago. States she just gets a catch or sharp pain in her groin at times. States it is worse after sitting for a long time. She denies fall or other injury. No numbness, tingling, weakness, or loss of function. States the pain only lasts for a little while and then goes away. Pt states she also noted a hard knot in her right lower abdomen. States she has had a hernia repair and has mesh. She denies abdominal pain, bowel changes, nausea, or vomiting.   Relevant past medical, surgical, family, and social history reviewed and updated as indicated.  Allergies and medications reviewed and updated.   Past Medical History:  Diagnosis Date  . Acute diverticulitis 10/07/2013   With microperforation.  . Bowel obstruction ([place])   . Cataract   . Diverticulosis of colon (without mention of hemorrhage)   . GERD (gastroesophageal reflux disease)   . Hemorrhoids   . HOH (hard of hearing)   . Hyperlipidemia   . Hypertension   . IBS (irritable bowel syndrome)   . Osteopenia   . Osteoporosis   . PONV (postoperative nausea and vomiting)   . Thoracic aortic atherosclerosis ([place])   . UTI (lower urinary tract infection) 10/07/2013  . Vitamin D deficiency     Past Surgical History:  Procedure Laterality Date  . ABDOMINAL HYSTERECTOMY    . APPENDECTOMY    . BILATERAL OOPHORECTOMY    . BREAST LUMPECTOMY Left   . CATARACT EXTRACTION    . COLON RESECTION N/A 12/01/2013   Procedure: HAND ASSISTED LAPAROSCOPIC PARTIAL COLECTOMY;  Surgeon: Jamesetta So, MD;  Location: [facility];  Service: General;  Laterality: N/A;  . EYE SURGERY    . HEMORRHOID SURGERY    . HERNIA REPAIR  09/04/2016   LAPAROSCOPIC VENTRAL INCISIONAL HERNIA REPAIR POSSIBLE OPEN (N/A)  . INCISIONAL HERNIA REPAIR N/A 09/04/2016   Procedure: LAPAROSCOPIC VENTRAL INCISIONAL HERNIA REPAIR POSSIBLE OPEN;  Surgeon: Alphonsa Overall, MD;  Location: [place];  Service: General;  Laterality: N/A;  . INSERTION OF MESH N/A 09/04/2016   Procedure: INSERTION OF MESH;  Surgeon: Alphonsa Overall, MD;  Location: [place];  Service: General;  Laterality: N/A;  . PARTIAL HYSTERECTOMY    . TONSILLECTOMY      Social History   Socioeconomic History  . Marital status: Widowed    Spouse name: Not on file  . Number of children: 3  . Years of education: Not on file  . Highest education level: Not on file  Occupational History  . Occupation: retired    Fish farm manager: RETIRED  Social Needs  . Financial resource strain: Not on file  . Food insecurity    Worry: Not on file    Inability: Not on file  . Transportation needs    Medical: Not on file    Non-medical: Not on file  Tobacco Use  . Smoking status: Former Smoker    Packs/day: 1.00    Years: 35.00    Pack years: 35.00    Types: Cigarettes    Quit date: 10/26/2002    Years since quitting: 16.4  . Smokeless tobacco: Never Used  Substance and  Sexual Activity  . Alcohol use: Yes    Alcohol/week: 5.0 standard drinks    Types: 5 Glasses of wine per week    Comment: wine  . Drug use: No  . Sexual activity: Never  Lifestyle  . Physical activity    Days per week: Not on file    Minutes per session: Not on file  . Stress: Not on file  Relationships  . Social Herbalist on phone: Not on file    Gets together: Not on file    Attends religious service: Not on file    Active member of club or organization: Not on file    Attends meetings of clubs or organizations: Not on file    Relationship status: Not on file  . Intimate partner violence    Fear of current or ex partner: Not on file    Emotionally abused: Not on file    Physically abused: Not on file    Forced sexual  activity: Not on file  Other Topics Concern  . Not on file  Social History Narrative  . Not on file    Outpatient Encounter Medications as of 04/10/2019  Medication Sig  . Ascorbic Acid (VITAMIN C) 1000 MG tablet Take 1,000 mg by mouth daily. Reported on 11/21/2015  . Calcium Carbonate-Vitamin D (CALTRATE 600+D) 600-400 MG-UNIT tablet Take 1 tablet by mouth daily.  . Cholecalciferol (VITAMIN D3) 5000 units CAPS Take 1 capsule by mouth every Monday, Wednesday, and Friday.  . denosumab (PROLIA) 60 MG/ML SOSY injection Inject 60 mg into the skin every 6 (six) months.  . enalapril (VASOTEC) 5 MG tablet Take 1 tablet (5 mg total) by mouth daily.  . fluticasone (FLONASE) 50 MCG/ACT nasal spray USE ONE TO TWO SPRAYS IN EACH NOSTRIL ONCE DAILY AS DIRECTED.  [name] Kitchen ketoconazole (NIZORAL) 2 % cream Apply 1 application topically daily.  . ranitidine (ZANTAC) 150 MG tablet Take 150 mg by mouth 2 (two) times daily.  . rosuvastatin (CRESTOR) 5 MG tablet TAKE 0.5 TABLETS (2.5 MG TOTAL) BY MOUTH EVERY MONDAY, WEDNESDAY, AND FRIDAY.  [name] Kitchen spironolactone (ALDACTONE) 25 MG tablet TAKE 2 TABLETS BY MOUTH EVERY DAY  . triamcinolone cream (KENALOG) 0.1 % APPLY TO AFFECTED AREA TWICE A DAY  . zolpidem (AMBIEN) 5 MG tablet TAKE 1 TABLET BY MOUTH EVERY DAY AT BEDTIME AS NEEDED   No facility-administered encounter medications on file as of 04/10/2019.     Allergies  Allergen Reactions  . Actonel [Risedronate Sodium] Other (See Comments)    Reaction=Heart burn Atelvia - caused diarrhea and vomiting  . Fosamax [Alendronate Sodium] Other (See Comments)    Reaction=Heart burn  . Crestor [Rosuvastatin Calcium] Other (See Comments)    Severe myalgias   . Nsaids Swelling    Reaction=Swelling of face  . Other     General anesthesia-nausea  . Augmentin [Amoxicillin-Pot Clavulanate]     Pt thinks she had nausea with this med when asked 04/27/17  . Cephalexin Itching and Rash  . Ciprofloxacin Itching and Rash  . Lovaza  [Omega-3-Acid Ethyl Esters] Other (See Comments)    Reaction=skin bruise     Review of Systems  Constitutional: Negative for activity change, appetite change, chills, diaphoresis, fatigue, fever and unexpected weight change.  Respiratory: Negative for cough, chest tightness and shortness of breath.   Cardiovascular: Negative for chest pain, palpitations and leg swelling.  Gastrointestinal: Negative for abdominal distention, abdominal pain, anal bleeding, blood in stool, constipation, diarrhea, nausea, rectal pain  and vomiting.  Genitourinary: Negative for decreased urine volume, difficulty urinating, hematuria and pelvic pain.  Musculoskeletal: Positive for arthralgias. Negative for gait problem, joint swelling and myalgias.  Skin: Negative for color change, pallor, rash and wound.  Neurological: Negative for dizziness, seizures, weakness, numbness and headaches.  Psychiatric/Behavioral: Negative for confusion.  All other systems reviewed and are negative.       Objective:  BP (!) 127/59   Pulse 72   Temp 98.1 F (36.7 C) (Oral)   Ht 5' (1.524 m)   Wt 124 lb 9.6 oz (56.5 kg)   BMI 24.33 kg/m    Wt Readings from Last 3 Encounters:  04/10/19 124 lb 9.6 oz (56.5 kg)  12/14/18 122 lb (55.3 kg)  08/22/18 121 lb (54.9 kg)    Physical Exam Vitals signs and nursing note reviewed.  Constitutional:      General: She is not in acute distress.    Appearance: Normal appearance. She is well-developed and well-groomed. She is not ill-appearing, toxic-appearing or diaphoretic.  HENT:     Head: Normocephalic and atraumatic.     Jaw: There is normal jaw occlusion.     Right Ear: Hearing normal.     Left Ear: Hearing normal.     Nose: Nose normal.     Mouth/Throat:     Lips: Pink.     Mouth: Mucous membranes are moist.     Pharynx: Oropharynx is clear. Uvula midline.  Eyes:     General: Lids are normal.     Extraocular Movements: Extraocular movements intact.     Conjunctiva/sclera:  Conjunctivae normal.     Pupils: Pupils are equal, round, and reactive to light.  Neck:     Musculoskeletal: Normal range of motion and neck supple.     Thyroid: No thyroid mass, thyromegaly or thyroid tenderness.     Vascular: No carotid bruit or JVD.     Trachea: Trachea and phonation normal.  Cardiovascular:     Rate and Rhythm: Normal rate and regular rhythm.     Chest Wall: PMI is not displaced.     Pulses: Normal pulses.     Heart sounds: Normal heart sounds. No murmur. No friction rub. No gallop.   Pulmonary:     Effort: Pulmonary effort is normal. No respiratory distress.     Breath sounds: Normal breath sounds. No wheezing.  Abdominal:     General: Bowel sounds are normal. There is no distension or abdominal bruit.     Palpations: Abdomen is soft. There is mass. There is no hepatomegaly, splenomegaly or pulsatile mass.     Tenderness: There is no abdominal tenderness. There is no right CVA tenderness or left CVA tenderness.     Hernia: No hernia is present.    Musculoskeletal: Normal range of motion.     Right hip: Normal. She exhibits normal range of motion, normal strength, no tenderness, no bony tenderness, no swelling, no crepitus, no deformity and no laceration.     Right knee: Normal.     Lumbar back: Normal.     Right lower leg: No edema.     Left lower leg: No edema.  Lymphadenopathy:     Cervical: No cervical adenopathy.  Skin:    General: Skin is warm and dry.     Capillary Refill: Capillary refill takes less than 2 seconds.     Coloration: Skin is not cyanotic, jaundiced or pale.     Findings: No rash.  Neurological:  General: No focal deficit present.     Mental Status: She is alert and oriented to person, place, and time.     Cranial Nerves: Cranial nerves are intact.     Sensory: Sensation is intact.     Motor: Motor function is intact.     Coordination: Coordination is intact.     Gait: Gait is intact.     Deep Tendon Reflexes: Reflexes are normal  and symmetric.  Psychiatric:        Attention and Perception: Attention and perception normal.        Mood and Affect: Mood and affect normal.        Speech: Speech normal.        Behavior: Behavior normal. Behavior is cooperative.        Thought Content: Thought content normal.        Cognition and Memory: Cognition and memory normal.        Judgment: Judgment normal.     Results for orders placed or performed in visit on 12/14/18  [facility]  Result Value Ref Range   Glucose 105 (H) 65 - 99 mg/dL   BUN 14 8 - 27 mg/dL   Creatinine, Ser 0.63 0.57 - 1.00 mg/dL   GFR calc non Af Amer 87 >59 mL/min/1.73   GFR calc Af Amer 100 >59 mL/min/1.73   BUN/Creatinine Ratio 22 12 - 28   Sodium 139 134 - 144 mmol/L   Potassium 4.3 3.5 - 5.2 mmol/L   Chloride 100 96 - 106 mmol/L   [name] 24 20 - 29 mmol/L   Calcium 9.9 8.7 - 10.3 mg/dL  CBC with Differential/Platelet  Result Value Ref Range   WBC 4.2 3.4 - 10.8 x10E3/uL   RBC 4.05 3.77 - 5.28 x10E6/uL   Hemoglobin 12.9 11.1 - 15.9 g/dL   Hematocrit 37.4 34.0 - 46.6 %   MCV 92 79 - 97 fL   MCH 31.9 26.6 - 33.0 pg   MCHC 34.5 31.5 - 35.7 g/dL   RDW 11.9 11.7 - 15.4 %   Platelets 263 150 - 450 x10E3/uL   Neutrophils 52 Not Estab. %   Lymphs 34 Not Estab. %   Monocytes 10 Not Estab. %   Eos 3 Not Estab. %   Basos 1 Not Estab. %   Neutrophils Absolute 2.2 1.4 - 7.0 x10E3/uL   Lymphocytes Absolute 1.5 0.7 - 3.1 x10E3/uL   Monocytes Absolute 0.4 0.1 - 0.9 x10E3/uL   EOS (ABSOLUTE) 0.1 0.0 - 0.4 x10E3/uL   Basophils Absolute 0.0 0.0 - 0.2 x10E3/uL   Immature Granulocytes 0 Not Estab. %   Immature Grans (Abs) 0.0 0.0 - 0.1 x10E3/uL  Lipid panel  Result Value Ref Range   Cholesterol, Total 252 (H) 100 - 199 mg/dL   Triglycerides 124 0 - 149 mg/dL   HDL 75 >39 mg/dL   VLDL Cholesterol Cal 25 5 - 40 mg/dL   LDL Calculated 152 (H) 0 - 99 mg/dL   Chol/HDL Ratio 3.4 0.0 - 4.4 ratio  VITAMIN D 25 Hydroxy (Vit-D Deficiency, Fractures)  Result  Value Ref Range   Vit D, 25-Hydroxy 34.8 30.0 - 100.0 ng/mL  Hepatic function panel  Result Value Ref Range   Total Protein 6.5 6.0 - 8.5 g/dL   Albumin 4.4 3.7 - 4.7 g/dL   Bilirubin Total 0.4 0.0 - 1.2 mg/dL   Bilirubin, Direct 0.11 0.00 - 0.40 mg/dL   Alkaline Phosphatase 45 39 - 117 IU/L   AST  17 0 - 40 IU/L   ALT 14 0 - 32 IU/L       Pertinent labs & imaging results that were available during my care of the patient were reviewed by me and considered in my medical decision making.  Assessment & Plan:  Dorothy Pitts was seen today for groin pain.  Diagnoses and all orders for this visit:  Right groin pain Hip without deformity, crepitus, or limited ROM. Pt does have a hardened mass to RLQ, probable scar tissue from hernia surgery. Will order [place] today. Report any new or worsening symptoms. Pt denies need for pain medications.  -     US Abdomen Limited; Future     Continue all other maintenance medications.  Follow up plan: Return in about 2 weeks (around 04/24/2019), or if symptoms worsen or fail to improve, for right groin pain.   The above assessment and management plan was discussed with the patient. The patient verbalized understanding of and has agreed to the management plan. Patient is aware to call the clinic if symptoms persist or worsen. Patient is aware when to return to the clinic for a follow-up visit. Patient educated on when it is appropriate to go to the emergency department.   Monia Pouch, FNP-C [facility] [phone]

## 2019-04-19 ENCOUNTER — Ambulatory Visit (HOSPITAL_COMMUNITY)
Admission: RE | Admit: 2019-04-19 | Discharge: 2019-04-19 | Disposition: A | Discharge status: Home / Self Care | Payer: Medicare Other | Source: Ambulatory Visit | Attending: Family Medicine | Admitting: Family Medicine

## 2019-04-19 ENCOUNTER — Other Ambulatory Visit: Payer: Self-pay

## 2019-04-19 DIAGNOSIS — R1031 Right lower quadrant pain: Secondary | ICD-10-CM | POA: Diagnosis not present

## 2019-04-19 NOTE — Addendum Note (Signed)
Addended by: Baruch Gouty on: 04/19/2019 06:12 PM   Modules accepted: Orders

## 2019-04-24 ENCOUNTER — Encounter: Payer: Self-pay | Admitting: Family Medicine

## 2019-04-24 ENCOUNTER — Other Ambulatory Visit: Payer: Self-pay

## 2019-04-24 ENCOUNTER — Ambulatory Visit (INDEPENDENT_AMBULATORY_CARE_PROVIDER_SITE_OTHER): Payer: Medicare Other | Admitting: Family Medicine

## 2019-04-24 DIAGNOSIS — I10 Essential (primary) hypertension: Secondary | ICD-10-CM | POA: Diagnosis not present

## 2019-04-24 DIAGNOSIS — M810 Age-related osteoporosis without current pathological fracture: Secondary | ICD-10-CM | POA: Diagnosis not present

## 2019-04-24 DIAGNOSIS — H9193 Unspecified hearing loss, bilateral: Secondary | ICD-10-CM

## 2019-04-24 DIAGNOSIS — I700 Atherosclerosis of aorta: Secondary | ICD-10-CM

## 2019-04-24 DIAGNOSIS — E782 Mixed hyperlipidemia: Secondary | ICD-10-CM | POA: Diagnosis not present

## 2019-04-24 DIAGNOSIS — M25551 Pain in right hip: Secondary | ICD-10-CM

## 2019-04-24 DIAGNOSIS — K5730 Diverticulosis of large intestine without perforation or abscess without bleeding: Secondary | ICD-10-CM

## 2019-04-24 DIAGNOSIS — R1031 Right lower quadrant pain: Secondary | ICD-10-CM

## 2019-04-24 DIAGNOSIS — G8929 Other chronic pain: Secondary | ICD-10-CM | POA: Insufficient documentation

## 2019-04-24 DIAGNOSIS — E559 Vitamin D deficiency, unspecified: Secondary | ICD-10-CM

## 2019-04-24 DIAGNOSIS — K589 Irritable bowel syndrome without diarrhea: Secondary | ICD-10-CM

## 2019-04-24 NOTE — Addendum Note (Signed)
Addended by: Zannie Cove on: 04/24/2019 12:40 PM   Modules accepted: Orders

## 2019-04-24 NOTE — Progress Notes (Signed)
Virtual Visit Via telephone Note I connected with@ on 04/24/19 by telephone and verified that I am speaking with the correct person or authorized healthcare agent using two identifiers. Dorothy CootsJudith M Millan is currently located at home and there are no unauthorized people in close proximity. I completed this visit while in a private location in my home .  This visit type was conducted due to national recommendations for restrictions regarding the COVID-19 Pandemic (e.g. social distancing).  This format is felt to be most appropriate for this patient at this time.  All issues noted in this document were discussed and addressed.  No physical exam was performed.    I discussed the limitations, risks, security and privacy concerns of performing an evaluation and management service by telephone and the availability of in person appointments. I also discussed with the patient that there may be a patient responsible charge related to this service. The patient expressed understanding and agreed to proceed.   Date:  04/24/2019    ID:  Dorothy Pitts      Jul 05, 1941        [id number]   Patient Care Team Patient Care Team: Ernestina PennaMoore, Dewell Monnier W, MD as PCP - General (Family Medicine) Pyrtle, Carie CaddyJay M, MD as Consulting Physician (Gastroenterology) Ovidio KinNewman, David, MD as Consulting Physician (General Surgery) Annamaria HellingWein, Robert, MD as Consulting Physician (Obstetrics and Gynecology)  Reason for Visit: Primary Care Follow-up     History of Present Illness & Review of Systems:     Dorothy CootsJudith M Dorothy Pitts is a 78 y.o. year old female primary care patient that presents today for a telehealth visit.  The patient has had some ongoing problems with the past several weeks with the right groin area and a knot that is there.  She has had multiple surgeries on her abdomen and also has had a hysterectomy.  An ultrasound was done and indicated there was some adenopathy in the area and a CT scan is being planned.  She is also hurting  in the right groin so we may get a x-ray of the right hip also.  She otherwise denies any chest pain or shortness of breath.  She does have some head congestion with her allergies and is using Flonase and has had some nasal bleeding and will cut back on the Flonase for a while and continue with Claritin and do nasal saline.  She has also has had some slight heartburn after going off of the Zantac and has only been taking Pepcid AC once a day but will increase this to twice a day before breakfast and supper.  She has ongoing irritable bowel syndrome with loose stools and constipation but more constipation than loose bowel movements.  She is trying to find a right adjustment of using Colace and MiraLAX and she will continue to work on that.  She is not had any blood in the stool or black tarry bowel movements.  She does have occasional hemorrhoid issues.  She has urinary frequency but no burning or pain with voiding.  A CT scan of the abdomen is being planned because of the findings with the ultrasound and also we will see if we can get a x-ray of the right hip joint also.  Review of systems as stated, otherwise negative.  The patient does not have symptoms concerning for COVID-19 infection (fever, chills, cough, or new shortness of breath).      Current Medications (Verified) Allergies as of 04/24/2019  Reactions   Actonel [risedronate Sodium] Other (See Comments)   Reaction=Heart burn Atelvia - caused diarrhea and vomiting   Fosamax [alendronate Sodium] Other (See Comments)   Reaction=Heart burn   Crestor [rosuvastatin Calcium] Other (See Comments)   Severe myalgias   Nsaids Swelling   Reaction=Swelling of face   Other    General anesthesia-nausea   Augmentin [amoxicillin-pot Clavulanate]    Pt thinks she had nausea with this med when asked 04/27/17   Cephalexin Itching, Rash   Ciprofloxacin Itching, Rash   Lovaza [omega-3-acid Ethyl Esters] Other (See Comments)   Reaction=skin bruise        Medication List       Accurate as of April 24, 2019 10:08 AM. If you have any questions, ask your nurse or doctor.        Calcium Carbonate-Vitamin D 600-400 MG-UNIT tablet Commonly known as: Caltrate 600+D Take 1 tablet by mouth daily.   denosumab 60 MG/ML Sosy injection Commonly known as: PROLIA Inject 60 mg into the skin every 6 (six) months.   enalapril 5 MG tablet Commonly known as: VASOTEC Take 1 tablet (5 mg total) by mouth daily.   fluticasone 50 MCG/ACT nasal spray Commonly known as: FLONASE USE ONE TO TWO SPRAYS IN EACH NOSTRIL ONCE DAILY AS DIRECTED.   ketoconazole 2 % cream Commonly known as: NIZORAL Apply 1 application topically daily.   ranitidine 150 MG tablet Commonly known as: ZANTAC Take 150 mg by mouth 2 (two) times daily.   rosuvastatin 5 MG tablet Commonly known as: CRESTOR TAKE 0.5 TABLETS (2.5 MG TOTAL) BY MOUTH EVERY MONDAY, WEDNESDAY, AND FRIDAY.   spironolactone 25 MG tablet Commonly known as: ALDACTONE TAKE 2 TABLETS BY MOUTH EVERY DAY   triamcinolone cream 0.1 % Commonly known as: KENALOG APPLY TO AFFECTED AREA TWICE A DAY   vitamin C 1000 MG tablet Take 1,000 mg by mouth daily. Reported on 11/21/2015   Vitamin D3 125 MCG (5000 UT) Caps Take 1 capsule by mouth every Monday, Wednesday, and Friday.   zolpidem 5 MG tablet Commonly known as: AMBIEN TAKE 1 TABLET BY MOUTH EVERY DAY AT BEDTIME AS NEEDED           Allergies (Verified)    Actonel [risedronate sodium], Fosamax [alendronate sodium], Crestor [rosuvastatin calcium], Nsaids, Other, Augmentin [amoxicillin-pot clavulanate], Cephalexin, Ciprofloxacin, and Lovaza [omega-3-acid ethyl esters]  Past Medical History Past Medical History:  Diagnosis Date  . Acute diverticulitis 10/07/2013   With microperforation.  . Bowel obstruction ([place])   . Cataract   . Diverticulosis of colon (without mention of hemorrhage)   . GERD (gastroesophageal reflux disease)   . Hemorrhoids   .  HOH (hard of hearing)   . Hyperlipidemia   . Hypertension   . IBS (irritable bowel syndrome)   . Osteopenia   . Osteoporosis   . PONV (postoperative nausea and vomiting)   . Thoracic aortic atherosclerosis ([place])   . UTI (lower urinary tract infection) 10/07/2013  . Vitamin D deficiency      Past Surgical History:  Procedure Laterality Date  . ABDOMINAL HYSTERECTOMY    . APPENDECTOMY    . BILATERAL OOPHORECTOMY    . BREAST LUMPECTOMY Left   . CATARACT EXTRACTION    . COLON RESECTION N/A 12/01/2013   Procedure: HAND ASSISTED LAPAROSCOPIC PARTIAL COLECTOMY;  Surgeon: Jamesetta So, MD;  Location: [facility];  Service: General;  Laterality: N/A;  . EYE SURGERY    . HEMORRHOID SURGERY    . HERNIA  REPAIR  09/04/2016   LAPAROSCOPIC VENTRAL INCISIONAL HERNIA REPAIR POSSIBLE OPEN (N/A)  . INCISIONAL HERNIA REPAIR N/A 09/04/2016   Procedure: LAPAROSCOPIC VENTRAL INCISIONAL HERNIA REPAIR POSSIBLE OPEN;  Surgeon: Ovidio Kinavid Newman, MD;  [facility];  Service: General;  Laterality: N/A;  . INSERTION OF MESH N/A 09/04/2016   Procedure: INSERTION OF MESH;  Surgeon: Ovidio Kinavid Newman, MD;  Location: [facility] OR;  Service: General;  Laterality: N/A;  . PARTIAL HYSTERECTOMY    . TONSILLECTOMY      Social History   Socioeconomic History  . Marital status: Widowed    Spouse name: Not on file  . Number of children: 3  . Years of education: Not on file  . Highest education level: Not on file  Occupational History  . Occupation: retired    Associate Professormployer: RETIRED  Social Needs  . Financial resource strain: Not on file  . Food insecurity    Worry: Not on file    Inability: Not on file  . Transportation needs    Medical: Not on file    Non-medical: Not on file  Tobacco Use  . Smoking status: Former Smoker    Packs/day: 1.00    Years: 35.00    Pack years: 35.00    Types: Cigarettes    Quit date: 10/26/2002    Years since quitting: 16.5  . Smokeless tobacco: Never Used  Substance and Sexual Activity  .  Alcohol use: Yes    Alcohol/week: 5.0 standard drinks    Types: 5 Glasses of wine per week    Comment: wine  . Drug use: No  . Sexual activity: Never  Lifestyle  . Physical activity    Days per week: Not on file    Minutes per session: Not on file  . Stress: Not on file  Relationships  . Social Musicianconnections    Talks on phone: Not on file    Gets together: Not on file    Attends religious service: Not on file    Active member of club or organization: Not on file    Attends meetings of clubs or organizations: Not on file    Relationship status: Not on file  Other Topics Concern  . Not on file  Social History Narrative  . Not on file     Family History  Problem Relation Age of Onset  . Hypertension Mother   . Heart attack Mother [id number]  . Parkinson's disease Father   . Heart disease Brother 45       bypass  . Obesity Brother   . Colon cancer Neg Hx       Labs/Other Tests and Data Reviewed:    Wt Readings from Last 3 Encounters:  04/10/19 124 lb 9.6 oz (56.5 kg)  12/14/18 122 lb (55.3 kg)  08/22/18 121 lb (54.9 kg)   Temp Readings from Last 3 Encounters:  04/10/19 98.1 F (36.7 C) (Oral)  12/14/18 (!) 97.2 F (36.2 C) (Oral)  08/03/18 (!) 97.2 F (36.2 C) (Oral)   BP Readings from Last 3 Encounters:  04/10/19 (!) 127/59  12/14/18 (!) 137/58  08/22/18 123/63   Pulse Readings from Last 3 Encounters:  04/10/19 72  12/14/18 72  08/22/18 67     Lab Results  Component Value Date   HGBA1C 5.8 (H) 11/21/2015   HGBA1C 5.2% 06/28/2014   Lab Results  Component Value Date   LDLCALC 152 (H) 12/14/2018   CREATININE 0.63 12/14/2018       Chemistry  Component Value Date/Time   NA 139 12/14/2018 1327   K 4.3 12/14/2018 1327   CL 100 12/14/2018 1327   [name] 24 12/14/2018 1327   BUN 14 12/14/2018 1327   CREATININE 0.63 12/14/2018 1327      Component Value Date/Time   CALCIUM 9.9 12/14/2018 1327   ALKPHOS 45 12/14/2018 1327   AST 17 12/14/2018 1327   ALT  14 12/14/2018 1327   BILITOT 0.4 12/14/2018 1327         OBSERVATIONS/ OBJECTIVE:     Patient was alert and responded appropriately to questions asked of her.  She denies any chest pain.  She denies any shortness of breath.  She has a slight heartburn and the urinary frequency.  She does not check blood pressures at home.  Her weight is running about 120 and this is stable for her.  She will continue with the Claritin for her allergies but will hold off on using the Flonase but will use some nasal saline.  We will encourage her to follow through on getting the CT scan of the abdomen because of the findings of the ultrasound and we will also arrange for her to get x-rays of her right hip.  Physical exam deferred due to nature of telephonic visit.  ASSESSMENT & PLAN    Time:   Today, I have spent 30 minutes with the patient via telephone discussing the above including Covid precautions.     Visit Diagnoses: 1. Essential hypertension -Check blood pressures at home more regularly and watch sodium intake and report these readings to PCP at each visit  2. Mixed hyperlipidemia -Patient is only taking one half of a 5 mg pill once a week of Crestor. -Check lipid liver panel and regulate Crestor depending on results of LDL-C.  3. Age-related osteoporosis without current pathological fracture -Continue with Prolia  4. Thoracic aortic atherosclerosis (HCC) -Continue aggressive therapeutic lifestyle changes  5. Vitamin D deficiency -Continue with vitamin D replacement which is currently D3 5000 units daily on a regular basis  6. Decreased hearing of both ears -Follow-up with audiology as needed as Dr. Maximino SarinEric [place]  7. Diverticulosis of colon -Get CT scan of abdomen on follow-up of ultrasound  8. Irritable bowel syndrome, unspecified type -Patient will continue with Colace and MiraLAX and try to find the right combination that will help keep her bowels under the  best control.  9. Right lower quadrant abdominal pain -CT scan of abdomen  10.  Increased bruising -Get CBC and platelet count  Patient Instructions  Get CT scan as recommended and follow-up with Kari BaarsMichelle rakes with the results of this for possible referral for further evaluation if necessary. Go ahead and get x-rays of right hip also. Continue to drink plenty of fluids and stay well-hydrated Continue to practice good hand and respiratory hygiene Continue to eat healthy and follow aggressive therapeutic lifestyle changes to keep cholesterol under the best control possible Continue to get mammograms pelvic exams and eye exams on a regular basis     The above assessment and management plan was discussed with the patient. The patient verbalized understanding of and has agreed to the management plan. Patient is aware to call the clinic if symptoms persist or worsen. Patient is aware when to return to the clinic for a follow-up visit. Patient educated on when it is appropriate to go to the emergency department.    Ernestina Pennaonald W. Rondrick Barreira, MD [facility] [address]  [address], [place], [place] [address] Ph [phone]   Nyra Capes MD

## 2019-04-24 NOTE — Patient Instructions (Addendum)
Get CT scan as recommended and follow-up with Monia Pouch with the results of this for possible referral for further evaluation if necessary. Go ahead and get x-rays of right hip also. Continue to drink plenty of fluids and stay well-hydrated Continue to practice good hand and respiratory hygiene Continue to eat healthy and follow aggressive therapeutic lifestyle changes to keep cholesterol under the best control possible Continue to get mammograms pelvic exams and eye exams on a regular basis

## 2019-04-26 ENCOUNTER — Ambulatory Visit (INDEPENDENT_AMBULATORY_CARE_PROVIDER_SITE_OTHER): Payer: Medicare Other

## 2019-04-26 ENCOUNTER — Other Ambulatory Visit: Payer: Medicare Other

## 2019-04-26 ENCOUNTER — Other Ambulatory Visit: Payer: Self-pay

## 2019-04-26 DIAGNOSIS — R1031 Right lower quadrant pain: Secondary | ICD-10-CM

## 2019-04-26 DIAGNOSIS — K5730 Diverticulosis of large intestine without perforation or abscess without bleeding: Secondary | ICD-10-CM

## 2019-04-26 DIAGNOSIS — M810 Age-related osteoporosis without current pathological fracture: Secondary | ICD-10-CM

## 2019-04-26 DIAGNOSIS — M25551 Pain in right hip: Secondary | ICD-10-CM

## 2019-04-26 DIAGNOSIS — I700 Atherosclerosis of aorta: Secondary | ICD-10-CM

## 2019-04-26 DIAGNOSIS — E782 Mixed hyperlipidemia: Secondary | ICD-10-CM

## 2019-04-26 DIAGNOSIS — I10 Essential (primary) hypertension: Secondary | ICD-10-CM

## 2019-04-26 DIAGNOSIS — H9193 Unspecified hearing loss, bilateral: Secondary | ICD-10-CM

## 2019-04-26 DIAGNOSIS — K589 Irritable bowel syndrome without diarrhea: Secondary | ICD-10-CM

## 2019-04-26 DIAGNOSIS — E559 Vitamin D deficiency, unspecified: Secondary | ICD-10-CM

## 2019-04-27 ENCOUNTER — Telehealth: Payer: Self-pay | Admitting: Family Medicine

## 2019-04-27 LAB — CBC WITH DIFFERENTIAL/PLATELET
Basophils Absolute: 0 10*3/uL (ref 0.0–0.2)
Basos: 1 %
EOS (ABSOLUTE): 0.1 10*3/uL (ref 0.0–0.4)
Eos: 3 %
Hematocrit: 39.4 % (ref 34.0–46.6)
Hemoglobin: 13.2 g/dL (ref 11.1–15.9)
Immature Grans (Abs): 0 10*3/uL (ref 0.0–0.1)
Immature Granulocytes: 0 %
Lymphocytes Absolute: 1.6 10*3/uL (ref 0.7–3.1)
Lymphs: 36 %
MCH: 31.9 pg (ref 26.6–33.0)
MCHC: 33.5 g/dL (ref 31.5–35.7)
MCV: 95 fL (ref 79–97)
Monocytes Absolute: 0.4 10*3/uL (ref 0.1–0.9)
Monocytes: 9 %
Neutrophils Absolute: 2.3 10*3/uL (ref 1.4–7.0)
Neutrophils: 51 %
Platelets: 245 10*3/uL (ref 150–450)
RBC: 4.14 x10E6/uL (ref 3.77–5.28)
RDW: 11.7 % (ref 11.7–15.4)
WBC: 4.5 10*3/uL (ref 3.4–10.8)

## 2019-04-27 LAB — HEPATIC FUNCTION PANEL
ALT: 12 IU/L (ref 0–32)
AST: 14 IU/L (ref 0–40)
Albumin: 4.3 g/dL (ref 3.7–4.7)
Alkaline Phosphatase: 42 IU/L (ref 39–117)
Bilirubin Total: 0.3 mg/dL (ref 0.0–1.2)
Bilirubin, Direct: 0.09 mg/dL (ref 0.00–0.40)
Total Protein: 6.2 g/dL (ref 6.0–8.5)

## 2019-04-27 LAB — BMP8+EGFR
BUN/Creatinine Ratio: 23 (ref 12–28)
BUN: 18 mg/dL (ref 8–27)
CO2: 25 mmol/L (ref 20–29)
Calcium: 9.6 mg/dL (ref 8.7–10.3)
Chloride: 98 mmol/L (ref 96–106)
Creatinine, Ser: 0.79 mg/dL (ref 0.57–1.00)
GFR calc Af Amer: 84 mL/min/{1.73_m2} (ref >59)
GFR calc non Af Amer: 72 mL/min/{1.73_m2} (ref >59)
Glucose: 96 mg/dL (ref 65–99)
Potassium: 3.9 mmol/L (ref 3.5–5.2)
Sodium: 141 mmol/L (ref 134–144)

## 2019-04-27 LAB — LIPID PANEL
Chol/HDL Ratio: 3.2 ratio (ref 0.0–4.4)
Cholesterol, Total: 227 mg/dL — ABNORMAL HIGH (ref 100–199)
HDL: 70 mg/dL
LDL Calculated: 139 mg/dL — ABNORMAL HIGH (ref 0–99)
Triglycerides: 92 mg/dL (ref 0–149)
VLDL Cholesterol Cal: 18 mg/dL (ref 5–40)

## 2019-04-27 LAB — VITAMIN D 25 HYDROXY (VIT D DEFICIENCY, FRACTURES): Vit D, 25-Hydroxy: 64.4 ng/mL (ref 30.0–100.0)

## 2019-04-27 LAB — FECAL OCCULT BLOOD, IMMUNOCHEMICAL: Fecal Occult Bld: NEGATIVE

## 2019-05-01 ENCOUNTER — Telehealth: Payer: Self-pay | Admitting: Family Medicine

## 2019-05-02 NOTE — Telephone Encounter (Signed)
Ok to have this completed on 05/09/2019 unless she is having new or worsening symptoms. If symptoms have increased or new symptoms are present, we can place a new order for a CT sooner.

## 2019-05-02 NOTE — Telephone Encounter (Signed)
Patient aware and verbalizes understanding. 

## 2019-05-02 NOTE — Telephone Encounter (Signed)
Does she need this sooner then scheduled?

## 2019-05-09 ENCOUNTER — Other Ambulatory Visit: Payer: Self-pay

## 2019-05-09 ENCOUNTER — Ambulatory Visit (HOSPITAL_COMMUNITY)
Admission: RE | Admit: 2019-05-09 | Discharge: 2019-05-09 | Disposition: A | Discharge status: Home / Self Care | Payer: Medicare Other | Source: Ambulatory Visit | Attending: Family Medicine | Admitting: Family Medicine

## 2019-05-09 DIAGNOSIS — R1900 Intra-abdominal and pelvic swelling, mass and lump, unspecified site: Secondary | ICD-10-CM | POA: Insufficient documentation

## 2019-05-09 DIAGNOSIS — R1031 Right lower quadrant pain: Secondary | ICD-10-CM | POA: Diagnosis present

## 2019-05-09 MED ORDER — IOHEXOL 300 MG/ML  SOLN
100.0000 mL | Freq: Once | INTRAMUSCULAR | Status: AC | PRN
  Administered 2019-05-09: 100 mL via INTRAVENOUS

## 2019-05-11 ENCOUNTER — Telehealth: Payer: Self-pay | Admitting: Family Medicine

## 2019-05-11 NOTE — Telephone Encounter (Signed)
Patient aware of results.

## 2019-05-11 NOTE — Telephone Encounter (Signed)
Ct results were negativ efor any masses or abnormalities. She doe shave diverticulosis ( out pouchings of colon) but they are not flared up. Just needs  To watch diet and avoid foods with seeds ar undigestable skin.

## 2019-05-26 ENCOUNTER — Other Ambulatory Visit: Payer: Self-pay | Admitting: Family Medicine

## 2019-05-30 ENCOUNTER — Telehealth: Payer: Self-pay | Admitting: Family Medicine

## 2019-05-30 NOTE — Telephone Encounter (Signed)
All imaging is negative for return of hernia or soft tissue mass. If she is still having symptoms or concerns, she can be reevaluated.

## 2019-05-30 NOTE — Telephone Encounter (Signed)
Patient states that know is still felt groin area.

## 2019-05-30 NOTE — Telephone Encounter (Signed)
Patient aware and appt made 

## 2019-05-31 ENCOUNTER — Ambulatory Visit (INDEPENDENT_AMBULATORY_CARE_PROVIDER_SITE_OTHER): Payer: Medicare Other | Admitting: Physician Assistant

## 2019-05-31 ENCOUNTER — Encounter: Payer: Self-pay | Admitting: Physician Assistant

## 2019-05-31 ENCOUNTER — Other Ambulatory Visit: Payer: Self-pay

## 2019-05-31 ENCOUNTER — Other Ambulatory Visit: Payer: Self-pay | Admitting: Family Medicine

## 2019-05-31 VITALS — BP 111/63 | HR 70 | Temp 97.8°F | Ht 60.0 in | Wt 122.0 lb

## 2019-05-31 DIAGNOSIS — R222 Localized swelling, mass and lump, trunk: Secondary | ICD-10-CM | POA: Diagnosis not present

## 2019-05-31 NOTE — Progress Notes (Signed)
BP 111/63   Pulse 70   Temp 97.8 F (36.6 C) (Temporal)   Ht 5' (1.524 m)   Wt 122 lb (55.3 kg)   BMI 23.83 kg/m    Subjective:    Patient ID: Dorothy Pitts, female    DOB: [birth year], 78 y.o.   MRN: [id number]  HPI: Dorothy Pitts is a 78 y.o. female presenting on 05/31/2019 for knot in Groin area  The patient comes back in for recheck on the past that she had performed.  She was originally seen in our office for a palpable mass in the right lower abdomen.  She has had a history of surgeries and including an inguinal hernia repair.  She denies having any significant pain or changes in it but it has stayed persistently there.  The ultrasound did not describe anything to significant.  Therefore a CT was performed.  The CT did not seem to notice anything of any significant distinction.  I would like for her to go back to her surgeon to have him evaluated and reassured [place] or see if there is anything else that does need to be done.  Past Medical History:  Diagnosis Date  . Acute diverticulitis 10/07/2013   With microperforation.  . Bowel obstruction ([place])   . Cataract   . Diverticulosis of colon (without mention of hemorrhage)   . GERD (gastroesophageal reflux disease)   . Hemorrhoids   . HOH (hard of hearing)   . Hyperlipidemia   . Hypertension   . IBS (irritable bowel syndrome)   . Osteopenia   . Osteoporosis   . PONV (postoperative nausea and vomiting)   . Thoracic aortic atherosclerosis ([place])   . UTI (lower urinary tract infection) 10/07/2013  . Vitamin D deficiency    Relevant past medical, surgical, family and social history reviewed and updated as indicated. Interim medical history since our last visit reviewed. Allergies and medications reviewed and updated. DATA REVIEWED: CHART IN EPIC  Family History reviewed for pertinent findings.  Review of Systems  Constitutional: Negative.   HENT: Negative.   Eyes: Negative.   Respiratory: Negative.    Gastrointestinal: Negative.  Negative for abdominal pain, anal bleeding and vomiting.  Genitourinary: Negative.     Allergies as of 05/31/2019      Reactions   Actonel [risedronate Sodium] Other (See Comments)   Reaction=Heart burn Atelvia - caused diarrhea and vomiting   Fosamax [alendronate Sodium] Other (See Comments)   Reaction=Heart burn   Crestor [rosuvastatin Calcium] Other (See Comments)   Severe myalgias   Nsaids Swelling   Reaction=Swelling of face   Other    General anesthesia-nausea   Augmentin [amoxicillin-pot Clavulanate]    Pt thinks she had nausea with this med when asked 04/27/17   Cephalexin Itching, Rash   Ciprofloxacin Itching, Rash   Lovaza [omega-3-acid Ethyl Esters] Other (See Comments)   Reaction=skin bruise       Medication List       Accurate as of May 31, 2019 11:59 PM. If you have any questions, ask your nurse or doctor.        aspirin EC 81 MG tablet Take 81 mg by mouth daily.   Calcium Carbonate-Vitamin D 600-400 MG-UNIT tablet Commonly known as: Caltrate 600+D Take 1 tablet by mouth daily.   denosumab 60 MG/ML Sosy injection Commonly known as: PROLIA Inject 60 mg into the skin every 6 (six) months.   enalapril 5 MG tablet Commonly known as: VASOTEC Take 1 tablet (  5 mg total) by mouth daily.   fluticasone 50 MCG/ACT nasal spray Commonly known as: FLONASE USE ONE TO TWO SPRAYS IN EACH NOSTRIL ONCE DAILY AS DIRECTED.   ketoconazole 2 % cream Commonly known as: NIZORAL APPLY TO AFFECTED AREA EVERY DAY What changed: See the new instructions. Changed by: Rudi Heaponald Moore, MD   ranitidine 150 MG tablet Commonly known as: ZANTAC Take 150 mg by mouth 2 (two) times daily.   rosuvastatin 5 MG tablet Commonly known as: CRESTOR TAKE 0.5 TABLETS (2.5 MG TOTAL) BY MOUTH EVERY MONDAY, WEDNESDAY, AND FRIDAY.   spironolactone 25 MG tablet Commonly known as: ALDACTONE TAKE 2 TABLETS BY MOUTH EVERY DAY   triamcinolone cream 0.1 % Commonly  known as: KENALOG APPLY TO AFFECTED AREA TWICE A DAY   vitamin C 1000 MG tablet Take 1,000 mg by mouth daily. Reported on 11/21/2015   Vitamin D3 125 MCG (5000 UT) Caps Take 1 capsule by mouth every Monday, Wednesday, and Friday.   zolpidem 5 MG tablet Commonly known as: AMBIEN TAKE 1 TABLET BY MOUTH EVERY DAY AT BEDTIME AS NEEDED          Objective:    BP 111/63   Pulse 70   Temp 97.8 F (36.6 C) (Temporal)   Ht 5' (1.524 m)   Wt 122 lb (55.3 kg)   BMI 23.83 kg/m   Allergies  Allergen Reactions  . Actonel [Risedronate Sodium] Other (See Comments)    Reaction=Heart burn Atelvia - caused diarrhea and vomiting  . Fosamax [Alendronate Sodium] Other (See Comments)    Reaction=Heart burn  . Crestor [Rosuvastatin Calcium] Other (See Comments)    Severe myalgias   . Nsaids Swelling    Reaction=Swelling of face  . Other     General anesthesia-nausea  . Augmentin [Amoxicillin-Pot Clavulanate]     Pt thinks she had nausea with this med when asked 04/27/17  . Cephalexin Itching and Rash  . Ciprofloxacin Itching and Rash  . Lovaza [Omega-3-Acid Ethyl Esters] Other (See Comments)    Reaction=skin bruise     Wt Readings from Last 3 Encounters:  05/31/19 122 lb (55.3 kg)  04/10/19 124 lb 9.6 oz (56.5 kg)  12/14/18 122 lb (55.3 kg)    Physical Exam Constitutional:      Appearance: She is well-developed.  HENT:     Head: Normocephalic and atraumatic.  Eyes:     Conjunctiva/sclera: Conjunctivae normal.     Pupils: Pupils are equal, round, and reactive to light.  Cardiovascular:     Rate and Rhythm: Normal rate and regular rhythm.     Heart sounds: Normal heart sounds.  Pulmonary:     Effort: Pulmonary effort is normal.  Abdominal:     Palpations: There is mass.       Comments: Old scars seen. Mass is well circumscribed an dfeels to be on the surface  Skin:    General: Skin is warm and dry.     Findings: No rash.  Neurological:     Mental Status: She is alert and  oriented to person, place, and time.     Deep Tendon Reflexes: Reflexes are normal and symmetric.  Psychiatric:        Behavior: Behavior normal.        Thought Content: Thought content normal.        Judgment: Judgment normal.         Assessment & Plan:   1. Abdominal wall mass - Ambulatory referral to General Surgery  Continue all other maintenance medications as listed above.  Follow up plan: No follow-ups on file.  Educational handout given for survey  Remus LofflerAngel S. Bricelyn Freestone PA-C [facility] [address]  [place], [place] [address] [phone]   06/01/2019, 10:36 PM

## 2019-06-01 ENCOUNTER — Encounter: Payer: Self-pay | Admitting: Physician Assistant

## 2019-07-04 ENCOUNTER — Other Ambulatory Visit: Payer: Self-pay | Admitting: Family Medicine

## 2019-08-01 ENCOUNTER — Other Ambulatory Visit: Payer: Self-pay | Admitting: Family Medicine

## 2019-09-01 ENCOUNTER — Telehealth: Payer: Self-pay | Admitting: Family Medicine

## 2019-09-04 ENCOUNTER — Ambulatory Visit (INDEPENDENT_AMBULATORY_CARE_PROVIDER_SITE_OTHER): Payer: Medicare Other | Admitting: *Deleted

## 2019-09-04 DIAGNOSIS — Z0000 Encounter for general adult medical examination without abnormal findings: Secondary | ICD-10-CM

## 2019-09-04 NOTE — Progress Notes (Signed)
MEDICARE ANNUAL WELLNESS VISIT  09/04/2019  Telephone Visit Disclaimer This Medicare AWV was conducted by telephone due to national recommendations for restrictions regarding the COVID-19 Pandemic (e.g. social distancing).  I verified, using two identifiers, that I am speaking with Dorothy Pitts or their authorized healthcare agent. I discussed the limitations, risks, security, and privacy concerns of performing an evaluation and management service by telephone and the potential availability of an in-person appointment in the future. The patient expressed understanding and agreed to proceed.   Subjective:  Dorothy Pitts is a 78 y.o. female patient of Rakes, Doralee Albino, FNP who had a Medicare Annual Wellness Visit today via telephone. Dorothy Pitts is Retired and lives with their son. she has 3 children. she reports that she is socially active and does interact with friends/family regularly. she is moderately physically active and enjoys playing golf, doing crossword puzzles, watching [employer] TV and spending time with her 6 grandchildren.  Patient Care Team: Sonny Masters, FNP as PCP - General (Family Medicine) Pyrtle, Carie Caddy, MD as Consulting Physician (Gastroenterology) Ovidio Kin, MD as Consulting Physician (General Surgery) Annamaria Helling, MD as Consulting Physician (Obstetrics and Gynecology)  Advanced Directives 09/04/2019 08/23/2018 09/12/2016 09/04/2016 08/26/2016 11/21/2015 11/07/2014  Does Patient Have a Medical Advance Directive? No No No No No No No  Would patient like information on creating a medical advance directive? No - Patient declined Yes (MAU/Ambulatory/Procedural Areas - Information given) No - patient declined information No - patient declined information Yes - Transport planner given Yes - Transport planner given Yes - Transport planner given  Pre-existing out of facility DNR order (yellow form or pink MOST form) - - - - - - -    Hospital Utilization Over  the Past 12 Months: # of hospitalizations or ER visits: 0 # of surgeries: 0  Review of Systems    Patient reports that her overall health is unchanged compared to last year.  History obtained from chart review  Patient Reported Readings (BP, Pulse, CBG, Weight, etc) none  Pain Assessment Pain : No/denies pain     Current Medications & Allergies (verified) Allergies as of 09/04/2019      Reactions   Actonel [risedronate Sodium] Other (See Comments)   Reaction=Heart burn Atelvia - caused diarrhea and vomiting   Fosamax [alendronate Sodium] Other (See Comments)   Reaction=Heart burn   Crestor [rosuvastatin Calcium] Other (See Comments)   Severe myalgias   Nsaids Swelling   Reaction=Swelling of face   Other    General anesthesia-nausea   Augmentin [amoxicillin-pot Clavulanate]    Pt thinks she had nausea with this med when asked 04/27/17   Cephalexin Itching, Rash   Ciprofloxacin Itching, Rash   Lovaza [omega-3-acid Ethyl Esters] Other (See Comments)   Reaction=skin bruise       Medication List       Accurate as of September 04, 2019 10:25 AM. If you have any questions, ask your nurse or doctor.        STOP taking these medications   fluticasone 50 MCG/ACT nasal spray Commonly known as: FLONASE   ranitidine 150 MG tablet Commonly known as: ZANTAC     TAKE these medications   aspirin EC 81 MG tablet Take 81 mg by mouth daily.   Calcium Carbonate-Vitamin D 600-400 MG-UNIT tablet Commonly known as: Caltrate 600+D Take 1 tablet by mouth daily.   denosumab 60 MG/ML Sosy injection Commonly known as: PROLIA Inject 60 mg into the skin every 6 (  six) months.   enalapril 5 MG tablet Commonly known as: VASOTEC TAKE 1 TABLET BY MOUTH EVERY DAY   ketoconazole 2 % cream Commonly known as: NIZORAL APPLY TO AFFECTED AREA EVERY DAY   rosuvastatin 5 MG tablet Commonly known as: CRESTOR TAKE 0.5 TABLETS (2.5 MG TOTAL) BY MOUTH EVERY MONDAY, WEDNESDAY, AND FRIDAY.    spironolactone 25 MG tablet Commonly known as: ALDACTONE TAKE 2 TABLETS BY MOUTH EVERY DAY   triamcinolone cream 0.1 % Commonly known as: KENALOG APPLY TO AFFECTED AREA TWICE A DAY   vitamin C 1000 MG tablet Take 1,000 mg by mouth daily. Reported on 11/21/2015   Vitamin D3 125 MCG (5000 UT) Caps Take 1 capsule by mouth every Monday, Wednesday, and Friday.   zolpidem 5 MG tablet Commonly known as: AMBIEN TAKE 1 TABLET BY MOUTH EVERY DAY AT BEDTIME AS NEEDED       History (reviewed): Past Medical History:  Diagnosis Date  . Acute diverticulitis 10/07/2013   With microperforation.  . Bowel obstruction (HCC)   . Cataract   . Diverticulosis of colon (without mention of hemorrhage)   . GERD (gastroesophageal reflux disease)   . Hemorrhoids   . HOH (hard of hearing)   . Hyperlipidemia   . Hypertension   . IBS (irritable bowel syndrome)   . Osteopenia   . Osteoporosis   . PONV (postoperative nausea and vomiting)   . Thoracic aortic atherosclerosis (HCC)   . UTI (lower urinary tract infection) 10/07/2013  . Vitamin D deficiency    Past Surgical History:  Procedure Laterality Date  . ABDOMINAL HYSTERECTOMY    . APPENDECTOMY    . BILATERAL OOPHORECTOMY    . BREAST LUMPECTOMY Left   . CATARACT EXTRACTION    . COLON RESECTION N/A 12/01/2013   Procedure: HAND ASSISTED LAPAROSCOPIC PARTIAL COLECTOMY;  Surgeon: Dalia HeadingMark A Jenkins, MD;  Location: [facility];  Service: General;  Laterality: N/A;  . EYE SURGERY    . HEMORRHOID SURGERY    . HERNIA REPAIR  09/04/2016   LAPAROSCOPIC VENTRAL INCISIONAL HERNIA REPAIR POSSIBLE OPEN (N/A)  . INCISIONAL HERNIA REPAIR N/A 09/04/2016   Procedure: LAPAROSCOPIC VENTRAL INCISIONAL HERNIA REPAIR POSSIBLE OPEN;  Surgeon: Ovidio Kinavid Newman, MD;  [facility];  Service: General;  Laterality: N/A;  . INSERTION OF MESH N/A 09/04/2016   Procedure: INSERTION OF MESH;  Surgeon: Ovidio Kinavid Newman, MD;  Location: [facility] OR;  Service: General;  Laterality: N/A;  . PARTIAL  HYSTERECTOMY    . TONSILLECTOMY     Family History  Problem Relation Age of Onset  . Hypertension Mother   . Heart attack Mother 1667  . Parkinson's disease Father   . Heart disease Brother 45       bypass  . Obesity Brother   . Colon cancer Neg Hx    Social History   Socioeconomic History  . Marital status: Widowed    Spouse name: Not on file  . Number of children: 3  . Years of education: 5415  . Highest education level: Some college, no degree  Occupational History  . Occupation: retired    Associate Professormployer: RETIRED  Social Needs  . Financial resource strain: Not hard at all  . Food insecurity    Worry: Never true    Inability: Never true  . Transportation needs    Medical: No    Non-medical: No  Tobacco Use  . Smoking status: Former Smoker    Packs/day: 1.00    Years: 35.00  Pack years: 35.00    Types: Cigarettes    Quit date: 10/26/2002    Years since quitting: 16.8  . Smokeless tobacco: Never Used  Substance and Sexual Activity  . Alcohol use: Yes    Alcohol/week: 4.0 standard drinks    Types: 4 Glasses of wine per week    Comment: wine  . Drug use: No  . Sexual activity: Not Currently  Lifestyle  . Physical activity    Days per week: 5 days    Minutes per session: 30 min  . Stress: Not at all  Relationships  . Social connections    Talks on phone: More than three times a week    Gets together: More than three times a week    Attends religious service: More than 4 times per year    Active member of club or organization: Yes    Attends meetings of clubs or organizations: More than 4 times per year    Relationship status: Widowed  Other Topics Concern  . Not on file  Social History Narrative  . Not on file    Activities of Daily Living In your present state of health, do you have any difficulty performing the following activities: 09/04/2019  Hearing? Y  Comment wears bilateral hearing aids  Vision? N  Comment history of cataract extraction-wears  glasses-gets eye exams every 2 years  Difficulty concentrating or making decisions? Y  Comment has more trouble remembering peoples names  Walking or climbing stairs? N  Dressing or bathing? N  Doing errands, shopping? N  Preparing Food and eating ? N  Using the Toilet? N  In the past six months, have you accidently leaked urine? Y  Comment wears depends at night-doesn't have to during the day  Do you have problems with loss of bowel control? N  Managing your Medications? N  Managing your Finances? N  Housekeeping or managing your Housekeeping? N  Some recent data might be hidden    Patient Education/ Literacy How often do you need to have someone help you when you read instructions, pamphlets, or other written materials from your doctor or pharmacy?: 1 - Never What is the last grade level you completed in school?: some college-no degree  Exercise Current Exercise Habits: Home exercise routine, Type of exercise: walking, Time (Minutes): 30, Frequency (Times/Week): 5, Weekly Exercise (Minutes/Week): 150, Intensity: Mild, Exercise limited by: None identified  Diet Patient reports consuming 2 meals a day and 1 snack(s) a day Patient reports that her primary diet is: Regular Patient reports that she does have regular access to food.   Depression Screen PHQ 2/9 Scores 09/04/2019 05/31/2019 04/10/2019 12/14/2018 08/23/2018 08/03/2018 03/25/2018  PHQ - 2 Score 0 0 0 0 0 0 0     Fall Risk Fall Risk  09/04/2019 05/31/2019 04/10/2019 12/14/2018 08/23/2018  Falls in the past year? 0 0 0 0 No  Number falls in past yr: - - - - -  Injury with Fall? - - - - -     Objective:  Dorothy Pitts seemed alert and oriented and she participated appropriately during our telephone visit.  Blood Pressure Weight BMI  BP Readings from Last 3 Encounters:  05/31/19 111/63  04/10/19 (!) 127/59  12/14/18 (!) 137/58   Wt Readings from Last 3 Encounters:  05/31/19 122 lb (55.3 kg)  04/10/19 124 lb 9.6 oz  (56.5 kg)  12/14/18 122 lb (55.3 kg)   BMI Readings from Last 1 Encounters:  05/31/19 23.83 kg/m    *  Unable to obtain current vital signs, weight, and BMI due to telephone visit type  Hearing/Vision  . Dorothy Pitts did not seem to have difficulty with hearing/understanding during the telephone conversation . Reports that she has not had a formal eye exam by an eye care professional within the past year . Reports that she has not had a formal hearing evaluation within the past year *Unable to fully assess hearing and vision during telephone visit type  Cognitive Function: 6CIT Screen 09/04/2019  What Year? 0 points  What month? 0 points  What time? 0 points  Count back from 20 0 points  Months in reverse 0 points  Repeat phrase 0 points  Total Score 0   (Normal:0-7, Significant for Dysfunction: >8)  Normal Cognitive Function Screening: Yes   Immunization & Health Maintenance Record Immunization History  Administered Date(s) Administered  . Td 04/19/2006  . Zoster 12/24/2009    Health Maintenance  Topic Date Due  . Janet Berlin  04/19/2016  . DEXA SCAN  02/13/2019  . MAMMOGRAM  03/29/2019  . PNA vac Low Risk Adult (1 of 2 - PCV13) 09/28/2021 (Originally 07/09/2006)  . INFLUENZA VACCINE  Discontinued       Assessment  This is a routine wellness examination for Dorothy Pitts.  Health Maintenance: Due or Overdue Health Maintenance Due  Topic Date Due  . TETANUS/TDAP  04/19/2016  . DEXA SCAN  02/13/2019  . MAMMOGRAM  03/29/2019    Dorothy Pitts does not need a referral for Community Assistance: Care Management:   no Social Work:    no Prescription Assistance:  no Nutrition/Diabetes Education:  no   Plan:  Personalized Goals Goals Addressed            This Visit's Progress   . DIET - INCREASE WATER INTAKE       Try to drink 6-8 glasses of water daily      Personalized Health Maintenance & Screening Recommendations  Td vaccine Screening  mammography Bone densitometry screening  Lung Cancer Screening Recommended: no (Low Dose CT Chest recommended if Age 68-80 years, 30 pack-year currently smoking OR have quit w/in past 15 years) Hepatitis C Screening recommended: no HIV Screening recommended: no  Advanced Directives: Written information was not prepared per patient's request.  Referrals & Orders No orders of the defined types were placed in this encounter.   Follow-up Plan . Follow-up with Sonny Masters, FNP as planned . Schedule your Mammogram with your GYN as discussed . Schedule your DEXA as discussed . Consider TDAP vaccine at your next visit with your PCP   I have personally reviewed and noted the following in the patient's chart:   . Medical and social history . Use of alcohol, tobacco or illicit drugs  . Current medications and supplements . Functional ability and status . Nutritional status . Physical activity . Advanced directives . List of other physicians . Hospitalizations, surgeries, and ER visits in previous 12 months . Vitals . Screenings to include cognitive, depression, and falls . Referrals and appointments  In addition, I have reviewed and discussed with Dorothy Pitts certain preventive protocols, quality metrics, and best practice recommendations. A written personalized care plan for preventive services as well as general preventive health recommendations is available and can be mailed to the patient at her request.      Hessie Diener, LPN  26/12/3352

## 2019-09-04 NOTE — Telephone Encounter (Signed)
Left message to call back  

## 2019-09-04 NOTE — Patient Instructions (Signed)
Preventive Care 78 Years and Older, Female Preventive care refers to lifestyle choices and visits with your health care provider that can promote health and wellness. This includes:  A yearly physical exam. This is also called an annual well check.  Regular dental and eye exams.  Immunizations.  Screening for certain conditions.  Healthy lifestyle choices, such as diet and exercise. What can I expect for my preventive care visit? Physical exam Your health care provider will check:  Height and weight. These may be used to calculate body mass index (BMI), which is a measurement that tells if you are at a healthy weight.  Heart rate and blood pressure.  Your skin for abnormal spots. Counseling Your health care provider may ask you questions about:  Alcohol, tobacco, and drug use.  Emotional well-being.  Home and relationship well-being.  Sexual activity.  Eating habits.  History of falls.  Memory and ability to understand (cognition).  Work and work Statistician.  Pregnancy and menstrual history. What immunizations do I need?  Influenza (flu) vaccine  This is recommended every year. Tetanus, diphtheria, and pertussis (Tdap) vaccine  You may need a Td booster every 10 years. Varicella (chickenpox) vaccine  You may need this vaccine if you have not already been vaccinated. Zoster (shingles) vaccine  You may need this after age 78. Pneumococcal conjugate (PCV13) vaccine  One dose is recommended after age 78. Pneumococcal polysaccharide (PPSV23) vaccine  One dose is recommended after age 78. Measles, mumps, and rubella (MMR) vaccine  You may need at least one dose of MMR if you were born in 1957 or later. You may also need a second dose. Meningococcal conjugate (MenACWY) vaccine  You may need this if you have certain conditions. Hepatitis A vaccine  You may need this if you have certain conditions or if you travel or work in places where you may be exposed  to hepatitis A. Hepatitis B vaccine  You may need this if you have certain conditions or if you travel or work in places where you may be exposed to hepatitis B. Haemophilus influenzae type b (Hib) vaccine  You may need this if you have certain conditions. You may receive vaccines as individual doses or as more than one vaccine together in one shot (combination vaccines). Talk with your health care provider about the risks and benefits of combination vaccines. What tests do I need? Blood tests  Lipid and cholesterol levels. These may be checked every 5 years, or more frequently depending on your overall health.  Hepatitis C test.  Hepatitis B test. Screening  Lung cancer screening. You may have this screening every year starting at age 78 if you have a 30-pack-year history of smoking and currently smoke or have quit within the past 15 years.  Colorectal cancer screening. All adults should have this screening starting at age 78 and continuing until age 15. Your health care provider may recommend screening at age 78 if you are at increased risk. You will have tests every 1-10 years, depending on your results and the type of screening test.  Diabetes screening. This is done by checking your blood sugar (glucose) after you have not eaten for a while (fasting). You may have this done every 78 years.  Mammogram. This may be done every 1-2 years. Talk with your health care provider about how often you should have regular mammograms.  BRCA-related cancer screening. This may be done if you have a family history of breast, ovarian, tubal, or peritoneal cancers.  Other tests  Sexually transmitted disease (STD) testing.  Bone density scan. This is done to screen for osteoporosis. You may have this done starting at age 76. Follow these instructions at home: Eating and drinking  Eat a diet that includes fresh fruits and vegetables, whole grains, lean protein, and low-fat dairy products. Limit  your intake of foods with high amounts of sugar, saturated fats, and salt.  Take vitamin and mineral supplements as recommended by your health care provider.  Do not drink alcohol if your health care provider tells you not to drink.  If you drink alcohol: ? Limit how much you have to 0-1 drink a day. ? Be aware of how much alcohol is in your drink. In the [place] one drink equals one 12 oz bottle of beer (355 mL), one 5 oz glass of wine (148 mL), or one 1 oz glass of hard liquor (44 mL). Lifestyle  Take daily care of your teeth and gums.  Stay active. Exercise for at least 30 minutes on 5 or more days each week.  Do not use any products that contain nicotine or tobacco, such as cigarettes, e-cigarettes, and chewing tobacco. If you need help quitting, ask your health care provider.  If you are sexually active, practice safe sex. Use a condom or other form of protection in order to prevent STIs (sexually transmitted infections).  Talk with your health care provider about taking a low-dose aspirin or statin. What's next?  Go to your health care provider once a year for a well check visit.  Ask your health care provider how often you should have your eyes and teeth checked.  Stay up to date on all vaccines. This information is not intended to replace advice given to you by your health care provider. Make sure you discuss any questions you have with your health care provider. Document Released: 11/08/2015 Document Revised: 10/06/2018 Document Reviewed: 10/06/2018 Elsevier Patient Education  2020 [employer].

## 2019-09-13 NOTE — Telephone Encounter (Signed)
Patient has appointment with PCP on 10/02/19 and wants to get Prolia shot then.

## 2019-10-02 ENCOUNTER — Ambulatory Visit: Payer: Medicare Other | Admitting: Family Medicine

## 2019-10-21 ENCOUNTER — Other Ambulatory Visit: Payer: Self-pay | Admitting: Family Medicine

## 2019-10-24 ENCOUNTER — Other Ambulatory Visit: Payer: Self-pay | Admitting: Family Medicine

## 2019-10-25 ENCOUNTER — Ambulatory Visit (INDEPENDENT_AMBULATORY_CARE_PROVIDER_SITE_OTHER): Payer: Medicare Other | Admitting: Family Medicine

## 2019-10-25 ENCOUNTER — Other Ambulatory Visit: Payer: Self-pay | Admitting: Family Medicine

## 2019-10-25 ENCOUNTER — Encounter: Payer: Self-pay | Admitting: Family Medicine

## 2019-10-25 DIAGNOSIS — J069 Acute upper respiratory infection, unspecified: Secondary | ICD-10-CM | POA: Diagnosis not present

## 2019-10-25 MED ORDER — AZITHROMYCIN 250 MG PO TABS: ORAL_TABLET | ORAL | 0 refills | Status: DC

## 2019-10-25 NOTE — Progress Notes (Signed)
Virtual Visit via Telephone Note  I connected with Dorothy Pitts on 10/25/19 at 3:30 PM by telephone and verified that I am speaking with the correct person using two identifiers. Dorothy Pitts is currently located at home and nobody is currently with her during this visit. The provider, Loman Brooklyn, FNP is located in their home at time of visit.  I discussed the limitations, risks, security and privacy concerns of performing an evaluation and management service by telephone and the availability of in person appointments. I also discussed with the patient that there may be a patient responsible charge related to this service. The patient expressed understanding and agreed to proceed.  Subjective: PCP: Baruch Gouty, FNP  Chief Complaint  Patient presents with  . Sinus Problem   Patient complains of cough, head congestion, runny nose and postnasal drainage. Onset of symptoms was 1 week ago, unchanged since that time. She is drinking plenty of fluids. Evaluation to date: none. Treatment to date: antihistamines and a humidifer. She has a history of allergies. She does not smoke. Patient has had recent close contact with someone who has tested positive for COVID-19 but states she is quarantining herself at home.    ROS: Per HPI  Current Outpatient Medications:  .  Ascorbic Acid (VITAMIN C) 1000 MG tablet, Take 1,000 mg by mouth daily. Reported on 11/21/2015, Disp: , Rfl:  .  aspirin EC 81 MG tablet, Take 81 mg by mouth daily., Disp: , Rfl:  .  Calcium Carbonate-Vitamin D (CALTRATE 600+D) 600-400 MG-UNIT tablet, Take 1 tablet by mouth daily., Disp: 100 tablet, Rfl:  .  Cholecalciferol (VITAMIN D3) 5000 units CAPS, Take 1 capsule by mouth every Monday, Wednesday, and Friday., Disp: , Rfl:  .  denosumab (PROLIA) 60 MG/ML SOSY injection, Inject 60 mg into the skin every 6 (six) months., Disp: , Rfl:  .  enalapril (VASOTEC) 5 MG tablet, TAKE 1 TABLET BY MOUTH EVERY DAY, Disp: 90  tablet, Rfl: 3 .  ketoconazole (NIZORAL) 2 % cream, APPLY TO AFFECTED AREA EVERY DAY, Disp: 30 g, Rfl: 3 .  rosuvastatin (CRESTOR) 5 MG tablet, TAKE 0.5 TABLETS (2.5 MG TOTAL) BY MOUTH EVERY MONDAY, WEDNESDAY, AND FRIDAY., Disp: 18 tablet, Rfl: 2 .  spironolactone (ALDACTONE) 25 MG tablet, TAKE 2 TABLETS BY MOUTH EVERY DAY, Disp: 180 tablet, Rfl: 3 .  triamcinolone cream (KENALOG) 0.1 %, APPLY TO AFFECTED AREA TWICE A DAY, Disp: 15 g, Rfl: 3 .  zolpidem (AMBIEN) 5 MG tablet, TAKE 1 TABLET BY MOUTH EVERY DAY AT BEDTIME AS NEEDED, Disp: 30 tablet, Rfl: 5  Allergies  Allergen Reactions  . Actonel [Risedronate Sodium] Other (See Comments)    Reaction=Heart burn Atelvia - caused diarrhea and vomiting  . Fosamax [Alendronate Sodium] Other (See Comments)    Reaction=Heart burn  . Crestor [Rosuvastatin Calcium] Other (See Comments)    Severe myalgias   . Nsaids Swelling    Reaction=Swelling of face  . Other     General anesthesia-nausea  . Augmentin [Amoxicillin-Pot Clavulanate]     Pt thinks she had nausea with this med when asked 04/27/17  . Cephalexin Itching and Rash  . Ciprofloxacin Itching and Rash  . Lovaza [Omega-3-Acid Ethyl Esters] Other (See Comments)    Reaction=skin bruise    Past Medical History:  Diagnosis Date  . Acute diverticulitis 10/07/2013   With microperforation.  . Bowel obstruction ([place])   . Cataract   . Diverticulosis of colon (without mention of hemorrhage)   .  GERD (gastroesophageal reflux disease)   . Hemorrhoids   . HOH (hard of hearing)   . Hyperlipidemia   . Hypertension   . IBS (irritable bowel syndrome)   . Osteopenia   . Osteoporosis   . PONV (postoperative nausea and vomiting)   . Thoracic aortic atherosclerosis (HCC)   . UTI (lower urinary tract infection) 10/07/2013  . Vitamin D deficiency     Observations/Objective: A&O  No respiratory distress or wheezing audible over the phone Mood, judgement, and thought processes all  WNL  Assessment and Plan: 1. Upper respiratory tract infection, unspecified type - Discussed symptom management.  Encourage patient to go get tested for COVID-19 especially since her granddaughter has tested positive and she was around her prior to onset of symptoms.  Patient does not feel she needs to be tested as she is quarantining for 2 weeks after the onset of her symptoms and she does not work.  Education provided on URIs and COVID-19. - azithromycin (ZITHROMAX) 250 MG tablet; As directed  Dispense: 6 tablet; Refill: 0   Follow Up Instructions:  I discussed the assessment and treatment plan with the patient. The patient was provided an opportunity to ask questions and all were answered. The patient agreed with the plan and demonstrated an understanding of the instructions.   The patient was advised to call back or seek an in-person evaluation if the symptoms worsen or if the condition fails to improve as anticipated.  The above assessment and management plan was discussed with the patient. The patient verbalized understanding of and has agreed to the management plan. Patient is aware to call the clinic if symptoms persist or worsen. Patient is aware when to return to the clinic for a follow-up visit. Patient educated on when it is appropriate to go to the emergency department.   Time call ended: 3:38 PM  I provided 12 minutes of non-face-to-face time during this encounter.  Deliah Boston, MSN, APRN, FNP-C [facility] 10/25/19

## 2019-10-29 NOTE — Patient Instructions (Signed)
Viral Respiratory Infection A viral respiratory infection is an illness that affects parts of the body that are used for breathing. These include the lungs, nose, and throat. It is caused by a germ called a virus. Some examples of this kind of infection are:  A cold.  The flu (influenza).  A respiratory syncytial virus (RSV) infection. A person who gets this illness may have the following symptoms:  A stuffy or runny nose.  Yellow or green fluid in the nose.  A cough.  Sneezing.  Tiredness (fatigue).  Achy muscles.  A sore throat.  Sweating or chills.  A fever.  A headache. Follow these instructions at home: Managing pain and congestion  Take over-the-counter and prescription medicines only as told by your doctor.  If you have a sore throat, gargle with salt water. Do this 3-4 times per day or as needed. To make a salt-water mixture, dissolve -1 tsp of salt in 1 cup of warm water. Make sure that all the salt dissolves.  Use nose drops made from salt water. This helps with stuffiness (congestion). It also helps soften the skin around your nose.  Drink enough fluid to keep your pee (urine) pale yellow. General instructions   Rest as much as possible.  Do not drink alcohol.  Do not use any products that have nicotine or tobacco, such as cigarettes and e-cigarettes. If you need help quitting, ask your doctor.  Keep all follow-up visits as told by your doctor. This is important. How is this prevented?   Get a flu shot every year. Ask your doctor when you should get your flu shot.  Do not let other people get your germs. If you are sick: ? Stay home from work or school. ? Wash your hands with soap and water often. Wash your hands after you cough or sneeze. If soap and water are not available, use hand sanitizer.  Avoid contact with people who are sick during cold and flu season. This is in fall and winter. Get help if:  Your symptoms last for 10 days or  longer.  Your symptoms get worse over time.  You have a fever.  You have very bad pain in your face or forehead.  Parts of your jaw or neck become very swollen. Get help right away if:  You feel pain or pressure in your chest.  You have shortness of breath.  You faint or feel like you will faint.  You keep throwing up (vomiting).  You feel confused. Summary  A viral respiratory infection is an illness that affects parts of the body that are used for breathing.  Examples of this illness include a cold, the flu, and respiratory syncytial virus (RSV) infection.  The infection can cause a runny nose, cough, sneezing, sore throat, and fever.  Follow what your doctor tells you about taking medicines, drinking lots of fluid, washing your hands, resting at home, and avoiding people who are sick. This information is not intended to replace advice given to you by your health care provider. Make sure you discuss any questions you have with your health care provider. Document Revised: 10/20/2018 Document Reviewed: 11/22/2017 Elsevier Patient Education  [employer].  This information is directly available on the CDC website: DiscoHelp.si.html    Source:CDC Reference to specific commercial products, manufacturers, companies, or trademarks does not constitute its endorsement or recommendation by the U.S. Government, Department of Health and [employer], or Centers for [employer] and Prevention.

## 2019-10-30 ENCOUNTER — Ambulatory Visit: Payer: Medicare Other | Admitting: Family Medicine

## 2019-11-02 ENCOUNTER — Other Ambulatory Visit: Payer: Self-pay

## 2019-11-03 ENCOUNTER — Ambulatory Visit (INDEPENDENT_AMBULATORY_CARE_PROVIDER_SITE_OTHER): Payer: Medicare Other | Admitting: Family Medicine

## 2019-11-03 ENCOUNTER — Encounter: Payer: Self-pay | Admitting: Family Medicine

## 2019-11-03 VITALS — BP 128/63 | HR 82 | Temp 96.0°F | Ht 60.0 in | Wt 117.6 lb

## 2019-11-03 DIAGNOSIS — Z79899 Other long term (current) drug therapy: Secondary | ICD-10-CM

## 2019-11-03 DIAGNOSIS — I10 Essential (primary) hypertension: Secondary | ICD-10-CM | POA: Diagnosis not present

## 2019-11-03 DIAGNOSIS — M810 Age-related osteoporosis without current pathological fracture: Secondary | ICD-10-CM

## 2019-11-03 DIAGNOSIS — H9193 Unspecified hearing loss, bilateral: Secondary | ICD-10-CM

## 2019-11-03 DIAGNOSIS — E559 Vitamin D deficiency, unspecified: Secondary | ICD-10-CM | POA: Insufficient documentation

## 2019-11-03 DIAGNOSIS — E782 Mixed hyperlipidemia: Secondary | ICD-10-CM | POA: Diagnosis not present

## 2019-11-03 DIAGNOSIS — F5101 Primary insomnia: Secondary | ICD-10-CM

## 2019-11-03 MED ORDER — ZOLPIDEM TARTRATE 5 MG PO TABS: 5.0000 mg | ORAL_TABLET | Freq: Every evening | ORAL | 5 refills | Status: DC | PRN

## 2019-11-03 NOTE — Progress Notes (Signed)
Subjective:  Patient ID: Dorothy Pitts, female    DOB: [birth year], 79 y.o.   MRN: [id number]  Patient Care Team: Baruch Gouty, FNP as PCP - General (Family Medicine) Pyrtle, Lajuan Lines, MD as Consulting Physician (Gastroenterology) Alphonsa Overall, MD as Consulting Physician (General Surgery) Vania Rea, MD as Consulting Physician (Obstetrics and Gynecology)   Chief Complaint:  Medical Management of Chronic Issues   HPI: Dorothy Pitts is a 79 y.o. female presenting on 11/03/2019 for Medical Management of Chronic Issues   1. Mixed hyperlipidemia Compliant with medications - Yes Current medications - Crestor Side effects from medications - No Diet - generally healthy Exercise - active daily  Lab Results  Component Value Date   CHOL 227 (H) 04/26/2019   HDL 70 04/26/2019   LDLCALC 139 (H) 04/26/2019   TRIG 92 04/26/2019   CHOLHDL 3.2 04/26/2019     Family and personal medical history reviewed. Smoking and ETOH history reviewed.    2. Vitamin D deficiency Pt is taking oral repletion therapy. Denies bone pain and tenderness, muscle weakness, fracture, and difficulty walking. Lab Results  Component Value Date   VD25OH 64.4 04/26/2019   VD25OH 34.8 12/14/2018   VD25OH 40.4 08/03/2018   Lab Results  Component Value Date   CALCIUM 9.6 04/26/2019   PHOS 3.0 12/04/2013      3. Decreased hearing of both ears Chronic problem. No worsening of symptoms.   4. Essential hypertension Complaint with meds - Yes Current Medications - enalapril, spironolactone Checking BP at home - No Exercising Regularly - Yes Watching Salt intake - Yes Pertinent ROS:  Headache - No Fatigue - No Visual Disturbances - No Chest pain - No Dyspnea - No Palpitations - No LE edema - No They report good compliance with medications and can restate their regimen by memory. No medication side effects.  Family, social, and smoking history reviewed.   BP Readings from Last 3 Encounters:    11/03/19 128/63  05/31/19 111/63  04/10/19 (!) 127/59   CMP Latest Ref Rng & Units 04/26/2019 12/14/2018 08/03/2018  Glucose 65 - 99 mg/dL 96 105(H) 97  BUN 8 - 27 mg/dL 18 14 17   Creatinine 0.57 - 1.00 mg/dL 0.79 0.63 0.67  Sodium 134 - 144 mmol/L 141 139 142  Potassium 3.5 - 5.2 mmol/L 3.9 4.3 4.2  Chloride 96 - 106 mmol/L 98 100 99  [name] 20 - 29 mmol/L 25 24 26   Calcium 8.7 - 10.3 mg/dL 9.6 9.9 10.0  Total Protein 6.0 - 8.5 g/dL 6.2 6.5 6.5  Total Bilirubin 0.0 - 1.2 mg/dL 0.3 0.4 0.3  Alkaline Phos 39 - 117 IU/L 42 45 52  AST 0 - 40 IU/L 14 17 13   ALT 0 - 32 IU/L 12 14 9       5. Age-related osteoporosis without current pathological fracture Patient on Prolia injections every 6 months.  She takes calcium and vitamin D repletion therapy daily.  She is active.  No recent fractures.  No OJN symptoms such as odontophagia, dysphagia, loose teeth, gum bleeding, or pharyngitis.  6. Primary insomnia Patient has had insomnia for several years and has been taking Ambien 5 mg nightly as needed for sleep.  Patient has tolerated this well with no adverse side effects.  She denies excessive caffeine intake.  She does have a normal bedtime routine.  States she is able to go to sleep without the Ambien but unable to stay asleep more than 2 to  3 hours at a time.  States with the Ambien she is able to sleep at least 5 to 8 hours per night.   Relevant past medical, surgical, family, and social history reviewed and updated as indicated.  Allergies and medications reviewed and updated. Date reviewed: Chart in Epic.   Past Medical History:  Diagnosis Date  . Acute diverticulitis 10/07/2013   With microperforation.  . Bowel obstruction ([place])   . Cataract   . Diverticulosis of colon (without mention of hemorrhage)   . GERD (gastroesophageal reflux disease)   . Hemorrhoids   . HOH (hard of hearing)   . Hyperlipidemia   . Hypertension   . IBS (irritable bowel syndrome)   . Osteopenia   .  Osteoporosis   . PONV (postoperative nausea and vomiting)   . Thoracic aortic atherosclerosis ([place])   . UTI (lower urinary tract infection) 10/07/2013  . Vitamin D deficiency     Past Surgical History:  Procedure Laterality Date  . ABDOMINAL HYSTERECTOMY    . APPENDECTOMY    . BILATERAL OOPHORECTOMY    . BREAST LUMPECTOMY Left   . CATARACT EXTRACTION    . COLON RESECTION N/A 12/01/2013   Procedure: HAND ASSISTED LAPAROSCOPIC PARTIAL COLECTOMY;  Surgeon: Jamesetta So, MD;  Location: [facility];  Service: General;  Laterality: N/A;  . EYE SURGERY    . HEMORRHOID SURGERY    . HERNIA REPAIR  09/04/2016   LAPAROSCOPIC VENTRAL INCISIONAL HERNIA REPAIR POSSIBLE OPEN (N/A)  . INCISIONAL HERNIA REPAIR N/A 09/04/2016   Procedure: LAPAROSCOPIC VENTRAL INCISIONAL HERNIA REPAIR POSSIBLE OPEN;  Surgeon: Alphonsa Overall, MD;  Location: [facility];  Service: General;  Laterality: N/A;  . INSERTION OF MESH N/A 09/04/2016   Procedure: INSERTION OF MESH;  Surgeon: Alphonsa Overall, MD;  Location: [facility];  Service: General;  Laterality: N/A;  . PARTIAL HYSTERECTOMY    . TONSILLECTOMY      Social History   Socioeconomic History  . Marital status: Widowed    Spouse name: Not on file  . Number of children: 3  . Years of education: 24  . Highest education level: Some college, no degree  Occupational History  . Occupation: retired    Fish farm manager: RETIRED  Tobacco Use  . Smoking status: Former Smoker    Packs/day: 1.00    Years: 35.00    Pack years: 35.00    Types: Cigarettes    Quit date: 10/26/2002    Years since quitting: 17.0  . Smokeless tobacco: Never Used  Substance and Sexual Activity  . Alcohol use: Yes    Alcohol/week: 4.0 standard drinks    Types: 4 Glasses of wine per week    Comment: wine  . Drug use: No  . Sexual activity: Not Currently  Other Topics Concern  . Not on file  Social History Narrative  . Not on file   Social Determinants of Health   Financial Resource Strain: Low Risk   .  Difficulty of Paying Living Expenses: Not hard at all  Food Insecurity: No Food Insecurity  . Worried About Charity fundraiser in the Last Year: Never true  . Ran Out of Food in the Last Year: Never true  Transportation Needs: No Transportation Needs  . Lack of Transportation (Medical): No  . Lack of Transportation (Non-Medical): No  Physical Activity: Sufficiently Active  . Days of Exercise per Week: 5 days  . Minutes of Exercise per Session: 30 min  Stress: No Stress Concern Present  .  Feeling of Stress : Not at all  Social Connections: Slightly Isolated  . Frequency of Communication with Friends and Family: More than three times a week  . Frequency of Social Gatherings with Friends and Family: More than three times a week  . Attends Religious Services: More than 4 times per year  . Active Member of Clubs or Organizations: Yes  . Attends Archivist Meetings: More than 4 times per year  . Marital Status: Widowed  Intimate Partner Violence: Not At Risk  . Fear of Current or Ex-Partner: No  . Emotionally Abused: No  . Physically Abused: No  . Sexually Abused: No    Outpatient Encounter Medications as of 11/03/2019  Medication Sig  . Ascorbic Acid (VITAMIN C) 1000 MG tablet Take 1,000 mg by mouth daily. Reported on 11/21/2015  . aspirin EC 81 MG tablet Take 81 mg by mouth daily.  . Calcium Carbonate-Vitamin D (CALTRATE 600+D) 600-400 MG-UNIT tablet Take 1 tablet by mouth daily.  . Cholecalciferol (VITAMIN D3) 5000 units CAPS Take 1 capsule by mouth every Monday, Wednesday, and Friday.  . denosumab (PROLIA) 60 MG/ML SOSY injection Inject 60 mg into the skin every 6 (six) months.  . enalapril (VASOTEC) 5 MG tablet TAKE 1 TABLET BY MOUTH EVERY DAY  . rosuvastatin (CRESTOR) 5 MG tablet TAKE 0.5 TABLETS (2.5 MG TOTAL) BY MOUTH EVERY MONDAY, WEDNESDAY, AND FRIDAY.  [name] Kitchen spironolactone (ALDACTONE) 25 MG tablet TAKE 2 TABLETS BY MOUTH EVERY DAY  . triamcinolone cream (KENALOG) 0.1 %  APPLY TO AFFECTED AREA TWICE A DAY  . zolpidem (AMBIEN) 5 MG tablet Take 1 tablet (5 mg total) by mouth at bedtime as needed for sleep.  . [DISCONTINUED] zolpidem (AMBIEN) 5 MG tablet TAKE 1 TABLET BY MOUTH EVERY DAY AT BEDTIME AS NEEDED  . [DISCONTINUED] azithromycin (ZITHROMAX) 250 MG tablet As directed  . [DISCONTINUED] ketoconazole (NIZORAL) 2 % cream APPLY TO AFFECTED AREA EVERY DAY   No facility-administered encounter medications on file as of 11/03/2019.    Allergies  Allergen Reactions  . Actonel [Risedronate Sodium] Other (See Comments)    Reaction=Heart burn Atelvia - caused diarrhea and vomiting  . Fosamax [Alendronate Sodium] Other (See Comments)    Reaction=Heart burn  . Crestor [Rosuvastatin Calcium] Other (See Comments)    Severe myalgias   . Nsaids Swelling    Reaction=Swelling of face  . Other     General anesthesia-nausea  . Augmentin [Amoxicillin-Pot Clavulanate]     Pt thinks she had nausea with this med when asked 04/27/17  . Cephalexin Itching and Rash  . Ciprofloxacin Itching and Rash  . Lovaza [Omega-3-Acid Ethyl Esters] Other (See Comments)    Reaction=skin bruise     Review of Systems  Constitutional: Negative for activity change, appetite change, chills, diaphoresis, fatigue, fever and unexpected weight change.  HENT: Positive for hearing loss. Negative for dental problem and trouble swallowing.   Eyes: Negative.   Respiratory: Negative for cough, chest tightness and shortness of breath.   Cardiovascular: Negative for chest pain, palpitations and leg swelling.  Gastrointestinal: Negative for abdominal pain, blood in stool, constipation, diarrhea, nausea and vomiting.  Endocrine: Negative.  Negative for cold intolerance, heat intolerance, polydipsia, polyphagia and polyuria.  Genitourinary: Negative for decreased urine volume, difficulty urinating, dysuria, frequency and urgency.  Musculoskeletal: Negative for arthralgias and myalgias.  Skin: Negative.     Allergic/Immunologic: Negative.   Neurological: Negative for dizziness, tremors, seizures, syncope, facial asymmetry, speech difficulty, weakness, light-headedness, numbness and headaches.  Hematological: Negative.   Psychiatric/Behavioral: Positive for sleep disturbance. Negative for confusion, hallucinations and suicidal ideas.  All other systems reviewed and are negative.       Objective:  BP 128/63   Pulse 82   Temp (!) 96 F (35.6 C) (Temporal)   Ht 5' (1.524 m)   Wt 117 lb 9.6 oz (53.3 kg)   SpO2 97%   BMI 22.97 kg/m    Wt Readings from Last 3 Encounters:  11/03/19 117 lb 9.6 oz (53.3 kg)  05/31/19 122 lb (55.3 kg)  04/10/19 124 lb 9.6 oz (56.5 kg)    Physical Exam Vitals and nursing note reviewed.  Constitutional:      General: She is not in acute distress.    Appearance: Normal appearance. She is well-developed and well-groomed. She is not ill-appearing, toxic-appearing or diaphoretic.  HENT:     Head: Normocephalic and atraumatic.     Jaw: There is normal jaw occlusion.     Right Ear: Decreased hearing noted.     Left Ear: Decreased hearing noted.     Nose: Nose normal.     Mouth/Throat:     Lips: Pink.     Mouth: Mucous membranes are moist.     Pharynx: Oropharynx is clear. Uvula midline.  Eyes:     General: Lids are normal.     Extraocular Movements: Extraocular movements intact.     Conjunctiva/sclera: Conjunctivae normal.     Pupils: Pupils are equal, round, and reactive to light.  Neck:     Thyroid: No thyroid mass, thyromegaly or thyroid tenderness.     Vascular: No carotid bruit or JVD.     Trachea: Trachea and phonation normal.  Cardiovascular:     Rate and Rhythm: Normal rate and regular rhythm.     Chest Wall: PMI is not displaced.     Pulses: Normal pulses.     Heart sounds: Normal heart sounds. No murmur. No friction rub. No gallop.   Pulmonary:     Effort: Pulmonary effort is normal. No respiratory distress.     Breath sounds: Normal  breath sounds. No wheezing.  Abdominal:     General: Bowel sounds are normal. There is no distension or abdominal bruit.     Palpations: Abdomen is soft. There is no hepatomegaly or splenomegaly.     Tenderness: There is no abdominal tenderness. There is no right CVA tenderness or left CVA tenderness.     Hernia: No hernia is present.  Musculoskeletal:        General: Normal range of motion.     Cervical back: Normal range of motion and neck supple.     Right lower leg: No edema.     Left lower leg: No edema.  Lymphadenopathy:     Cervical: No cervical adenopathy.  Skin:    General: Skin is warm and dry.     Capillary Refill: Capillary refill takes less than 2 seconds.     Coloration: Skin is not cyanotic, jaundiced or pale.     Findings: No rash.  Neurological:     General: No focal deficit present.     Mental Status: She is alert and oriented to person, place, and time.     Cranial Nerves: Cranial nerves are intact. No cranial nerve deficit.     Sensory: Sensation is intact. No sensory deficit.     Motor: Motor function is intact. No weakness.     Coordination: Coordination is intact. Coordination normal.  Gait: Gait is intact. Gait normal.     Deep Tendon Reflexes: Reflexes are normal and symmetric. Reflexes normal.  Psychiatric:        Attention and Perception: Attention and perception normal.        Mood and Affect: Mood and affect normal.        Speech: Speech normal.        Behavior: Behavior normal. Behavior is cooperative.        Thought Content: Thought content normal.        Cognition and Memory: Cognition and memory normal.        Judgment: Judgment normal.     Results for orders placed or performed in visit on 04/26/19  Fecal occult blood, imunochemical   Specimen: Stool   ST  Result Value Ref Range   Fecal Occult Bld Negative Negative  Hepatic function panel  Result Value Ref Range   Total Protein 6.2 6.0 - 8.5 g/dL   Albumin 4.3 3.7 - 4.7 g/dL    Bilirubin Total 0.3 0.0 - 1.2 mg/dL   Bilirubin, Direct 0.09 0.00 - 0.40 mg/dL   Alkaline Phosphatase 42 39 - 117 IU/L   AST 14 0 - 40 IU/L   ALT 12 0 - 32 IU/L  VITAMIN D 25 Hydroxy (Vit-D Deficiency, Fractures)  Result Value Ref Range   Vit D, 25-Hydroxy 64.4 30.0 - 100.0 ng/mL  Lipid panel  Result Value Ref Range   Cholesterol, Total 227 (H) 100 - 199 mg/dL   Triglycerides 92 0 - 149 mg/dL   HDL 70 >39 mg/dL   VLDL Cholesterol Cal 18 5 - 40 mg/dL   LDL Calculated 139 (H) 0 - 99 mg/dL   Chol/HDL Ratio 3.2 0.0 - 4.4 ratio  CBC with Differential/Platelet  Result Value Ref Range   WBC 4.5 3.4 - 10.8 x10E3/uL   RBC 4.14 3.77 - 5.28 x10E6/uL   Hemoglobin 13.2 11.1 - 15.9 g/dL   Hematocrit 39.4 34.0 - 46.6 %   MCV 95 79 - 97 fL   MCH 31.9 26.6 - 33.0 pg   MCHC 33.5 31.5 - 35.7 g/dL   RDW 11.7 11.7 - 15.4 %   Platelets 245 150 - 450 x10E3/uL   Neutrophils 51 Not Estab. %   Lymphs 36 Not Estab. %   Monocytes 9 Not Estab. %   Eos 3 Not Estab. %   Basos 1 Not Estab. %   Neutrophils Absolute 2.3 1.4 - 7.0 x10E3/uL   Lymphocytes Absolute 1.6 0.7 - 3.1 x10E3/uL   Monocytes Absolute 0.4 0.1 - 0.9 x10E3/uL   EOS (ABSOLUTE) 0.1 0.0 - 0.4 x10E3/uL   Basophils Absolute 0.0 0.0 - 0.2 x10E3/uL   Immature Granulocytes 0 Not Estab. %   Immature Grans (Abs) 0.0 0.0 - 0.1 x10E3/uL  BMP8+EGFR  Result Value Ref Range   Glucose 96 65 - 99 mg/dL   BUN 18 8 - 27 mg/dL   Creatinine, Ser 0.79 0.57 - 1.00 mg/dL   GFR calc non Af Amer 72 >59 mL/min/1.73   GFR calc Af Amer 84 >59 mL/min/1.73   BUN/Creatinine Ratio 23 12 - 28   Sodium 141 134 - 144 mmol/L   Potassium 3.9 3.5 - 5.2 mmol/L   Chloride 98 96 - 106 mmol/L   [name] 25 20 - 29 mmol/L   Calcium 9.6 8.7 - 10.3 mg/dL       Pertinent labs & imaging results that were available during my care of the patient were  reviewed by me and considered in my medical decision making.  Assessment & Plan:  Taneia was seen today for medical management of  chronic issues.  Diagnoses and all orders for this visit:  Mixed hyperlipidemia Diet encouraged - increase intake of fresh fruits and vegetables, increase intake of lean proteins. Bake, broil, or grill foods. Avoid fried, greasy, and fatty foods. Avoid fast foods. Increase intake of fiber-rich whole grains. Exercise encouraged - at least 150 minutes per week and advance as tolerated.  Goal BMI < 25. Continue medications as prescribed. Follow up in 3-6 months as discussed.  -     Lipid panel  Vitamin D deficiency Labs pending. Continue repletion therapy. If indicated, will change repletion dosage. Eat foods rich in Vit D including milk, orange juice, yogurt with vitamin D added, salmon or mackerel, canned tuna fish, cereals with vitamin D added, and cod liver oil. Get out in the sun but make sure to wear at least SPF 30 sunscreen.  -     Vitamin D 25 hydroxy  Decreased hearing of both ears Chromic for pt. No worsening in symptoms.   Essential hypertension BP well controlled. Changes were not made in regimen today. Goal BP is 130/80. Pt aware to report any persistent high or low readings. DASH diet and exercise encouraged. Exercise at least 150 minutes per week and increase as tolerated. Goal BMI > 25. Stress management encouraged. Avoid nicotine and tobacco product use. Avoid excessive alcohol and NSAID's. Avoid more than 2000 mg of sodium daily. Medications as prescribed. Follow up as scheduled.  -     CMP14+EGFR -     CBC with Differential/Platelet -     Thyroid Panel With TSH  Age-related osteoporosis without current pathological fracture Due for Prolia injection.  Staff has ordered Prolia for injection, it has not arrived office yet.  Patient will be notified once available.  Patient aware to continue calcium repletion and bone strengthening exercises at home. -     CMP14+EGFR  Primary insomnia Controlled substance agreement signed Controlled substance contract updated today.  Patient  doing very well on Ambien as prescribed.  We will continue. -     zolpidem (AMBIEN) 5 MG tablet; Take 1 tablet (5 mg total) by mouth at bedtime as needed for sleep. -     Thyroid Panel With TSH    Continue all other maintenance medications.  Follow up plan: Return in about 6 months (around 05/02/2020), or if symptoms worsen or fail to improve.  Continue healthy lifestyle choices, including diet (rich in fruits, vegetables, and lean proteins, and low in salt and simple carbohydrates) and exercise (at least 30 minutes of moderate physical activity daily).  Educational handout given for osteoporosis  The above assessment and management plan was discussed with the patient. The patient verbalized understanding of and has agreed to the management plan. Patient is aware to call the clinic if they develop any new symptoms or if symptoms persist or worsen. Patient is aware when to return to the clinic for a follow-up visit. Patient educated on when it is appropriate to go to the emergency department.   Monia Pouch, FNP-C [facility] [phone]

## 2019-11-03 NOTE — Patient Instructions (Signed)
Osteoporosis Your recent bone density scan demonstrated that you have osteoporosis.  Today, I provided you a handout reviewing calcium and vitamin D and ways to incorporate this in your diet.  You need to take a calcium and vitamin D supplement daily. I would also like to start you on Prolia to prevent further loss of bone density. This is an injection that is given every 6 months at the office. Please let us know if this is something you would like to start. Strength training is just as important in maintaining bone health.  A copy of home exercises has been provided to you. You need to repeat your bone density in 2 years.    Osteoporosis is the thinning and loss of density in the bones. Osteoporosis makes the bones more brittle, fragile, and likely to break (fracture). Over time, osteoporosis can cause the bones to become so weak that they fracture after a simple fall. The bones most likely to fracture are the bones in the hip, wrist, and spine. What are the causes? The exact cause is not known. What increases the risk? Anyone can develop osteoporosis. You may be at greater risk if you have a family history of the condition or have poor nutrition. You may also have a higher risk if you are:  Female.  33 years old or older.  A smoker.  Not physically active.  White or Asian.  Slender.  What are the signs or symptoms? A fracture might be the first sign of the disease, especially if it results from a fall or injury that would not usually cause a bone to break. Other signs and symptoms include:  Low back and neck pain.  Stooped posture.  Height loss.  How is this diagnosed? To make a diagnosis, your health care provider may:  Take a medical history.  Perform a physical exam.  Order tests, such as: ? A bone mineral density test. ? A dual-energy X-ray absorptiometry test.  How is this treated? The goal of osteoporosis treatment is to strengthen your bones to reduce your risk  of a fracture. Treatment may involve:  Making lifestyle changes, such as: ? Eating a diet rich in calcium. ? Doing weight-bearing and muscle-strengthening exercises. ? Stopping tobacco use. ? Limiting alcohol intake.  Taking medicine to slow the process of bone loss or to increase bone density.  Monitoring your levels of calcium and vitamin D.  Follow these instructions at home:  Include calcium and vitamin D in your diet. Calcium is important for bone health, and vitamin D helps the body absorb calcium. Os-Cal is a great over the counter supplement to take daily. It has calcium and Vitamin D.   Perform weight-bearing and muscle-strengthening exercises as directed by your health care provider. Exercises listed below.   Do not use any tobacco products, including cigarettes, chewing tobacco, and electronic cigarettes. If you need help quitting, ask your health care provider.  Limit your alcohol intake.  Take medicines only as directed by your health care provider.  Keep all follow-up visits as directed by your health care provider. This is important.  Take precautions at home to lower your risk of falling, such as: ? Keeping rooms well lit and clutter free. ? Installing safety rails on stairs. ? Using rubber mats in the bathroom and other areas that are often wet or slippery.  Get help right away if: You fall or injure yourself. This information is not intended to replace advice given to you by your health  care provider. Make sure you discuss any questions you have with your health care provider. Document Released: 07/22/2005 Document Revised: 03/16/2016 Document Reviewed: 03/22/2014 Elsevier Interactive Patient Education  2018 [employer].  Exercise for [employer]  Exercise is important to build and maintain strong bones / bone density.  There are 2 types of exercises that are important to building and maintaining strong bones:  Weight- bearing and  muscle-stregthening.  Weight-bearing Exercises  These exercises include activities that make you move against gravity while staying upright. Weight-bearing exercises can be high-impact or low-impact.  High-impact weight-bearing exercises help build bones and keep them strong. If you have broken a bone due to osteoporosis or are at risk of breaking a bone, you may need to avoid high-impact exercises. If you're not sure, you should check with your healthcare provider.  Examples of high-impact weight-bearing exercises are: . Dancing  . Doing high-impact aerobics  . Hiking  . Jogging/running  . Jumping Rope  . Stair climbing  . Tennis  Low-impact weight-bearing exercises can also help keep bones strong and are a safe alternative if you cannot do high-impact exercises.   Examples of low-impact weight-bearing exercises are: . Using elliptical training machines  . Doing low-impact aerobics  . Using stair-step machines  . Fast walking on a treadmill or outside   Muscle-Strengthening Exercises These exercises include activities where you move your body, a weight or some other resistance against gravity. They are also known as resistance exercises and include: . Lifting weights  . Using elastic exercise bands  . Using weight machines  . Lifting your own body weight  . Functional movements, such as standing and rising up on your toes  Yoga and Pilates can also improve strength, balance and flexibility. However, certain positions may not be safe for people with osteoporosis or those at increased risk of broken bones. For example, exercises that have you bend forward may increase the chance of breaking a bone in the spine.   Non-Impact Exercises There are other types of exercises that can help prevent falls.  Non-impact exercises can help you to improve balance, posture and how well you move in everyday activities. Some of these exercises include: . Balance exercises that strengthen your  legs and test your balance, such as Tai Chi, can decrease your risk of falls.  . Posture exercises that improve your posture and reduce rounded or "sloping" shoulders can help you decrease the chance of breaking a bone, especially in the spine.  . Functional exercises that improve how well you move can help you with everyday activities and decrease your chance of falling and breaking a bone. For example, if you have trouble getting up from a chair or climbing stairs, you should do these activities as exercises.   A physical therapist can teach you balance, posture and functional exercises. He/she can also help you learn which exercises are safe and appropriate for you.   has a physical therapy office in [place] in front of our office and referrals can be made for assessments and treatment as needed and strength and balance training.  If you would like to have an assessment with [place] and our physical therapy team please let a nurse or provider know.  For more information go to the [facility] website: [email or web address]   . Click the striped box in the right upper corner.   . Then click through patient info in the left lower hand corner to out more about osteoporosis and osteopenia  and how you can prevent fractures.      Calcium & Vitamin D: The Facts  Why is calcium and vitamin D consumption important? Calcium: . Most Americans do not consume adequate amounts of calcium! Calcium is required for proper muscle function, nerve communication, bone support, and many other functions in the body.  . The body uses bones as a source of calcium. Bones 'remodel' themselves continuously - the body constantly breaks bone down to release calcium and rebuilds bones by replacing calcium in the bone later.  . As we get older, the rate of bone breakdown occurs faster than bone rebuilding which could lead to osteopenia, osteoporosis, and possible fractures.   Vitamin  D: . People naturally make vitamin D in the body when sunlight hits the skin and triggers a process that leads to vitamin D production. This natural vitamin D production requires about 10-15 minutes of sun exposure on the hands, arms, and face at least 2-3 times per week. However, due to decreased sun exposure and the use of sunscreen, most people will need to get additional vitamin D from foods or supplements. Your doctor can measure your body's vitamin D level through a simple blood test to determine your daily vitamin D needs.  . Vitamin D is used to help the body absorb calcium, maintain bone health, help the immune system, and reduce inflammation. It also plays a role in muscle performance, balance and risk of falling.  . Vitamin D deficiency can lead to osteomalacia or softening of the bones, bone pain, and muscle weakness.   The recommended daily allowance of Calcium and Vitamin D varies for different age groups. Age group Calcium (mg) Vitamin D (IU)  Females and Males: Age 19-50 1000 mg 600 IU  Females: Age 51- 70 1200 mg 600 IU  Males: Age 51-70 1000 mg 600 IU  Females and Males: Age 71+ 1200 mg 800 IU  Pregnant/lactating Females age 19-50 1000 mg 600 IU   How much Calcium do you get in your diet? Calcium Intake # of servings per day  Total calcium (mg)  Skim milk, 2% milk (1 cup) _________ x 300 mg   Yogurt (1 small container) _________ x 200 mg   Cheese (1oz) _________ x 200 mg   Cottage Cheese (1 cup)             ________ x 150 mg   Almond milk (1 cup) _________ x 450 mg   Fortified Orange Juice (1 cup) _________ x 300 mg   Broccoli or spinach ( 1 cup) _________ x 100 mg   Salmon (3 oz) _________ x 150 mg    Almonds (1/4 cup) _______ x 90 mg      How do we get Calcium and Vitamin D in our diet? Calcium: . Obtaining calcium from the diet is the most preferred way to reach the recommended daily goal. If this goal is not reached through diet, calcium supplements are available.   . Calcium is found in many foods including: dairy products, dark leafy vegetables (like broccoli, kale, and spinach), fish, and fortified products like juices and cereals.  . The food label will have a %DV (percent daily value) listed showing the amount of calcium per serving. To determine the total mg per serving, simply replace the % with zero (0).  For example, Almond Breeze almond milk contains 45% DV of calcium or 450mg per 1 cup.  . You can increase the amount of calcium in your diet by using   more calcium products in your daily meals. Use yogurt and fruit to make smoothies or use yogurt to top baked potatoes or make whipped potatoes. Sprinkle low fat cheese onto salads or into egg white omelets. You can even add non-fat dry milk powder (357m calcium per 1/3 cup) to hot cereals, meat loaf, soups, or potatoes.  . Calcium supplements come in many forms including tablets, chewables, and gummies. Be sure to read the label to determine the correct number of tablets per serving and whether or not to take the supplement with food.  . Calcium carbonate products (Oscal, Caltrate, and Viactiv) are generally better absorbed when taken with food while calcium citrate products like Citracal can be taken with or without food.  . The body can only absorb about 600 mg of calcium at one time. It is recommended to take calcium supplements in small amounts several times per day.  However, taking it all at once is better than not taking it at all. . Increasing your intake of calcium is essential for bone health, but may also lead to some side effects like constipation, increased gas, bloating or abdominal cramping. To help reduce these side effects, start with 1 tablet per day and slowly increase your intake of the supplement to the recommended doses. It is also recommended that you drink plenty of water each day. Vitamin D: . Very few foods naturally contain vitamin D. However, it is found in saltwater fish (like  tuna, salmon and mackerel), beef liver, egg yolks, cheese and vitamin D fortified foods (like yogurt, cereals, orange juice and milk) . The amount of vitamin D in each food or product is listed as %DV on the product label. To determine the total amount of vitamin D per serving, drop the % sign and multiply the number by 4. For example, 1 cup of Almond Breeze almond milk contains 25% DV vitamin D or 100 IU per serving (25 x 4 =100). . Vitamin D is also found in multivitamins and supplements and may be listed as ergocalciferol (vitamin D2) or cholecalciferol (vitamin D3). Each of these forms of vitamin D are equivalent and the daily recommended intake will vary based on your age and the vitamin D levels in your body. Follow your doctor's recommendation for vitamin D intake.

## 2019-11-04 LAB — CBC WITH DIFFERENTIAL/PLATELET
Basophils Absolute: 0 10*3/uL (ref 0.0–0.2)
Basos: 0 %
EOS (ABSOLUTE): 0.1 10*3/uL (ref 0.0–0.4)
Eos: 1 %
Hematocrit: 35.9 % (ref 34.0–46.6)
Hemoglobin: 12.4 g/dL (ref 11.1–15.9)
Immature Grans (Abs): 0 10*3/uL (ref 0.0–0.1)
Immature Granulocytes: 0 %
Lymphocytes Absolute: 1.1 10*3/uL (ref 0.7–3.1)
Lymphs: 18 %
MCH: 32.5 pg (ref 26.6–33.0)
MCHC: 34.5 g/dL (ref 31.5–35.7)
MCV: 94 fL (ref 79–97)
Monocytes Absolute: 0.6 10*3/uL (ref 0.1–0.9)
Monocytes: 10 %
Neutrophils Absolute: 4.5 10*3/uL (ref 1.4–7.0)
Neutrophils: 71 %
Platelets: 283 10*3/uL (ref 150–450)
RBC: 3.81 x10E6/uL (ref 3.77–5.28)
RDW: 11.8 % (ref 11.7–15.4)
WBC: 6.3 10*3/uL (ref 3.4–10.8)

## 2019-11-04 LAB — CMP14+EGFR
ALT: 14 IU/L (ref 0–32)
AST: 20 IU/L (ref 0–40)
Albumin/Globulin Ratio: 1.9 (ref 1.2–2.2)
Albumin: 4.1 g/dL (ref 3.7–4.7)
Alkaline Phosphatase: 56 IU/L (ref 39–117)
BUN/Creatinine Ratio: 24 (ref 12–28)
BUN: 19 mg/dL (ref 8–27)
Bilirubin Total: 0.3 mg/dL (ref 0.0–1.2)
CO2: 25 mmol/L (ref 20–29)
Calcium: 10 mg/dL (ref 8.7–10.3)
Chloride: 97 mmol/L (ref 96–106)
Creatinine, Ser: 0.79 mg/dL (ref 0.57–1.00)
GFR calc Af Amer: 83 mL/min/{1.73_m2} (ref >59)
GFR calc non Af Amer: 72 mL/min/{1.73_m2} (ref >59)
Globulin, Total: 2.2 g/dL (ref 1.5–4.5)
Glucose: 107 mg/dL — ABNORMAL HIGH (ref 65–99)
Potassium: 4.2 mmol/L (ref 3.5–5.2)
Sodium: 138 mmol/L (ref 134–144)
Total Protein: 6.3 g/dL (ref 6.0–8.5)

## 2019-11-04 LAB — THYROID PANEL WITH TSH
Free Thyroxine Index: 1.6 (ref 1.2–4.9)
T3 Uptake Ratio: 25 % (ref 24–39)
T4, Total: 6.2 ug/dL (ref 4.5–12.0)
TSH: 1.19 u[IU]/mL (ref 0.450–4.500)

## 2019-11-04 LAB — LIPID PANEL
Chol/HDL Ratio: 3.4 ratio (ref 0.0–4.4)
Cholesterol, Total: 201 mg/dL — ABNORMAL HIGH (ref 100–199)
HDL: 60 mg/dL (ref >39)
LDL Chol Calc (NIH): 122 mg/dL — ABNORMAL HIGH (ref 0–99)
Triglycerides: 108 mg/dL (ref 0–149)
VLDL Cholesterol Cal: 19 mg/dL (ref 5–40)

## 2019-11-04 LAB — VITAMIN D 25 HYDROXY (VIT D DEFICIENCY, FRACTURES): Vit D, 25-Hydroxy: 65.3 ng/mL (ref 30.0–100.0)

## 2019-11-08 ENCOUNTER — Telehealth: Payer: Self-pay | Admitting: Family Medicine

## 2019-11-20 ENCOUNTER — Telehealth: Payer: Self-pay | Admitting: Family Medicine

## 2019-11-24 NOTE — Telephone Encounter (Signed)
Meds ordered and arrived. Appt scheduled and patient notified

## 2019-11-27 ENCOUNTER — Ambulatory Visit (INDEPENDENT_AMBULATORY_CARE_PROVIDER_SITE_OTHER): Payer: Medicare Other | Admitting: *Deleted

## 2019-11-27 ENCOUNTER — Other Ambulatory Visit: Payer: Self-pay

## 2019-11-27 DIAGNOSIS — M810 Age-related osteoporosis without current pathological fracture: Secondary | ICD-10-CM

## 2019-11-27 MED ORDER — DENOSUMAB 60 MG/ML ~~LOC~~ SOSY
60.0000 mg | PREFILLED_SYRINGE | Freq: Once | SUBCUTANEOUS | Status: AC
  Administered 2019-11-27: 60 mg via SUBCUTANEOUS

## 2019-12-05 ENCOUNTER — Other Ambulatory Visit: Payer: Self-pay | Admitting: Family Medicine

## 2020-02-02 ENCOUNTER — Encounter: Payer: Self-pay | Admitting: *Deleted

## 2020-02-22 ENCOUNTER — Encounter: Payer: Self-pay | Admitting: *Deleted

## 2020-03-05 ENCOUNTER — Other Ambulatory Visit: Payer: Self-pay | Admitting: *Deleted

## 2020-03-05 MED ORDER — ROSUVASTATIN CALCIUM 5 MG PO TABS: 2.5000 mg | ORAL_TABLET | ORAL | 0 refills | Status: DC

## 2020-04-01 ENCOUNTER — Telehealth: Payer: Self-pay | Admitting: *Deleted

## 2020-04-01 DIAGNOSIS — Z79899 Other long term (current) drug therapy: Secondary | ICD-10-CM

## 2020-04-01 DIAGNOSIS — F5101 Primary insomnia: Secondary | ICD-10-CM

## 2020-04-01 MED ORDER — ZOLPIDEM TARTRATE 5 MG PO TABS: 5.0000 mg | ORAL_TABLET | Freq: Every evening | ORAL | 1 refills | Status: DC | PRN

## 2020-04-01 NOTE — Telephone Encounter (Signed)
Last RF on Zolpidem written on 11/03/19 can not be filled since it was written by Kari Baars Please send in new Rx for this last remaining refill that patient was originally given Pt is needing today, she is going on a trip

## 2020-04-01 NOTE — Telephone Encounter (Signed)
Prescription sent to pharmacy.

## 2020-05-02 ENCOUNTER — Ambulatory Visit (INDEPENDENT_AMBULATORY_CARE_PROVIDER_SITE_OTHER): Payer: Medicare Other | Admitting: Family Medicine

## 2020-05-02 ENCOUNTER — Other Ambulatory Visit: Payer: Self-pay

## 2020-05-02 ENCOUNTER — Encounter: Payer: Self-pay | Admitting: Family Medicine

## 2020-05-02 ENCOUNTER — Ambulatory Visit: Payer: Medicare Other | Admitting: Family Medicine

## 2020-05-02 VITALS — BP 119/61 | HR 69 | Temp 97.3°F | Ht 61.0 in | Wt 114.6 lb

## 2020-05-02 DIAGNOSIS — R7303 Prediabetes: Secondary | ICD-10-CM

## 2020-05-02 DIAGNOSIS — D692 Other nonthrombocytopenic purpura: Secondary | ICD-10-CM

## 2020-05-02 DIAGNOSIS — F5101 Primary insomnia: Secondary | ICD-10-CM | POA: Diagnosis not present

## 2020-05-02 DIAGNOSIS — E559 Vitamin D deficiency, unspecified: Secondary | ICD-10-CM

## 2020-05-02 DIAGNOSIS — I700 Atherosclerosis of aorta: Secondary | ICD-10-CM

## 2020-05-02 DIAGNOSIS — E782 Mixed hyperlipidemia: Secondary | ICD-10-CM

## 2020-05-02 DIAGNOSIS — I10 Essential (primary) hypertension: Secondary | ICD-10-CM | POA: Diagnosis not present

## 2020-05-02 DIAGNOSIS — L821 Other seborrheic keratosis: Secondary | ICD-10-CM

## 2020-05-02 DIAGNOSIS — Z79899 Other long term (current) drug therapy: Secondary | ICD-10-CM | POA: Diagnosis not present

## 2020-05-02 DIAGNOSIS — M810 Age-related osteoporosis without current pathological fracture: Secondary | ICD-10-CM

## 2020-05-02 LAB — CMP14+EGFR
ALT: 14 IU/L (ref 0–32)
AST: 18 IU/L (ref 0–40)
Albumin/Globulin Ratio: 2.6 — ABNORMAL HIGH (ref 1.2–2.2)
Albumin: 4.9 g/dL — ABNORMAL HIGH (ref 3.7–4.7)
Alkaline Phosphatase: 45 IU/L — ABNORMAL LOW (ref 48–121)
BUN/Creatinine Ratio: 18 (ref 12–28)
BUN: 14 mg/dL (ref 8–27)
Bilirubin Total: 0.5 mg/dL (ref 0.0–1.2)
CO2: 25 mmol/L (ref 20–29)
Calcium: 10.3 mg/dL (ref 8.7–10.3)
Chloride: 95 mmol/L — ABNORMAL LOW (ref 96–106)
Creatinine, Ser: 0.78 mg/dL (ref 0.57–1.00)
GFR calc Af Amer: 84 mL/min/{1.73_m2} (ref >59)
GFR calc non Af Amer: 73 mL/min/{1.73_m2} (ref >59)
Globulin, Total: 1.9 g/dL (ref 1.5–4.5)
Glucose: 107 mg/dL — ABNORMAL HIGH (ref 65–99)
Potassium: 4.1 mmol/L (ref 3.5–5.2)
Sodium: 136 mmol/L (ref 134–144)
Total Protein: 6.8 g/dL (ref 6.0–8.5)

## 2020-05-02 LAB — BAYER DCA HB A1C WAIVED: HB A1C (BAYER DCA - WAIVED): 5.6 % (ref <7.0)

## 2020-05-02 MED ORDER — SPIRONOLACTONE 50 MG PO TABS: 50.0000 mg | ORAL_TABLET | Freq: Every day | ORAL | 1 refills | Status: DC

## 2020-05-02 MED ORDER — ENALAPRIL MALEATE 5 MG PO TABS: 5.0000 mg | ORAL_TABLET | Freq: Every day | ORAL | 1 refills | Status: DC

## 2020-05-02 MED ORDER — ROSUVASTATIN CALCIUM 5 MG PO TABS: 2.5000 mg | ORAL_TABLET | ORAL | 1 refills | Status: DC

## 2020-05-02 MED ORDER — ZOLPIDEM TARTRATE 5 MG PO TABS: 5.0000 mg | ORAL_TABLET | Freq: Every evening | ORAL | 5 refills | Status: DC | PRN

## 2020-05-02 NOTE — Progress Notes (Signed)
Assessment & Plan:  1. Essential hypertension - Well controlled on current regimen.  - enalapril (VASOTEC) 5 MG tablet; Take 1 tablet (5 mg total) by mouth daily.  Dispense: 90 tablet; Refill: 1 - spironolactone (ALDACTONE) 50 MG tablet; Take 1 tablet (50 mg total) by mouth daily.  Dispense: 90 tablet; Refill: 1 - CMP14+EGFR  2. Primary insomnia - Well controlled on current regimen.  Controlled substance agreement signed today.  Urine drug screen obtained today.  PDMP reviewed with no concerning findings. Last Ambien use last night. - zolpidem (AMBIEN) 5 MG tablet; Take 1 tablet (5 mg total) by mouth at bedtime as needed for sleep.  Dispense: 30 tablet; Refill: 5 - Compliance Drug Analysis, Ur  3. Controlled substance agreement signed - zolpidem (AMBIEN) 5 MG tablet; Take 1 tablet (5 mg total) by mouth at bedtime as needed for sleep.  Dispense: 30 tablet; Refill: 5 - Compliance Drug Analysis, Ur  4. Senile purpura ([place]) - Reassurance provided.  5. Seborrheic keratoses - Education provided on seborrheic keratoses.  Reassurance provided.  6. Age-related osteoporosis without current pathological fracture - DG [facility] DEXA  7. Vitamin D deficiency - Vitamin D level within normal limits 6 months ago.  Patient does take a vitamin D supplement.  8. Mixed hyperlipidemia - rosuvastatin (CRESTOR) 5 MG tablet; Take 0.5 tablets (2.5 mg total) by mouth every Monday, Wednesday, and Friday.  Dispense: 18 tablet; Refill: 1 - CMP14+EGFR  9. Thoracic aortic atherosclerosis (HCC) - rosuvastatin (CRESTOR) 5 MG tablet; Take 0.5 tablets (2.5 mg total) by mouth every Monday, Wednesday, and Friday.  Dispense: 18 tablet; Refill: 1 - CMP14+EGFR  10. Pre-diabetes - Bayer DCA Hb A1c Waived   Return in about 6 months (around 11/02/2020) for follow-up of chronic medication conditions.  Hendricks Limes, MSN, APRN, FNP-C [facility]  Subjective:    Patient ID: Dorothy Pitts,  female    DOB: 1941-01-12, 79 y.o.   MRN: [id number]  Patient Care Team: Loman Brooklyn, FNP as PCP - General (Family Medicine) Pyrtle, Lajuan Lines, MD as Consulting Physician (Gastroenterology) Alphonsa Overall, MD as Consulting Physician (General Surgery) Vania Rea, MD as Consulting Physician (Obstetrics and Gynecology)   Chief Complaint:  Chief Complaint  Patient presents with  . Establish Care    Rakes pt. Patient is concerened about her brusing easy.  [name] Kitchen Hypertension    6 month follow up of chronic medical conditions  . Hyperlipidemia    HPI: Dorothy Pitts is a 79 y.o. female presenting on 05/02/2020 for Establish Care (Rakes pt. Patient is concerened about her brusing easy.), Hypertension (6 month follow up of chronic medical conditions), and Hyperlipidemia  Patient takes 2.5 or 5 mg of Ambien nightly for sleep.  Patient reports she bruises easily.  Does not take any aspirin or blood thinner.  She reports she is supposed to take aspirin, she does does not.  Patient also has a place on her arm that she wants looked at.   Social history:  Relevant past medical, surgical, family and social history reviewed and updated as indicated. Interim medical history since our last visit reviewed.  Allergies and medications reviewed and updated.  DATA REVIEWED: CHART IN EPIC  ROS: Negative unless specifically indicated above in HPI.    Current Outpatient Medications:  .  Ascorbic Acid (VITAMIN C) 1000 MG tablet, Take 1,000 mg by mouth daily. Reported on 11/21/2015, Disp: , Rfl:  .  aspirin EC 81 MG tablet, Take 81 mg  by mouth daily., Disp: , Rfl:  .  Calcium Carbonate-Vitamin D (CALTRATE 600+D) 600-400 MG-UNIT tablet, Take 1 tablet by mouth daily., Disp: 100 tablet, Rfl:  .  Cholecalciferol (VITAMIN D3) 5000 units CAPS, Take 1 capsule by mouth every Monday, Wednesday, and Friday., Disp: , Rfl:  .  denosumab (PROLIA) 60 MG/ML SOSY injection, Inject 60 mg into the skin every 6 (six)  months., Disp: , Rfl:  .  enalapril (VASOTEC) 5 MG tablet, TAKE 1 TABLET BY MOUTH EVERY DAY, Disp: 90 tablet, Rfl: 3 .  loratadine (CLARITIN) 10 MG tablet, Take 10 mg by mouth daily., Disp: , Rfl:  .  Polyethylene Glycol 3350 (MIRALAX PO), Take by mouth., Disp: , Rfl:  .  rosuvastatin (CRESTOR) 5 MG tablet, Take 0.5 tablets (2.5 mg total) by mouth every Monday, Wednesday, and Friday., Disp: 18 tablet, Rfl: 0 .  spironolactone (ALDACTONE) 25 MG tablet, TAKE 2 TABLETS BY MOUTH EVERY DAY, Disp: 180 tablet, Rfl: 3 .  triamcinolone cream (KENALOG) 0.1 %, APPLY TO AFFECTED AREA TWICE A DAY, Disp: 15 g, Rfl: 3 .  zolpidem (AMBIEN) 5 MG tablet, Take 1 tablet (5 mg total) by mouth at bedtime as needed for sleep., Disp: 30 tablet, Rfl: 1   Allergies  Allergen Reactions  . Actonel [Risedronate Sodium] Other (See Comments)    Reaction=Heart burn Atelvia - caused diarrhea and vomiting  . Fosamax [Alendronate Sodium] Other (See Comments)    Reaction=Heart burn  . Crestor [Rosuvastatin Calcium] Other (See Comments)    Severe myalgias   . Nsaids Swelling    Reaction=Swelling of face  . Other     General anesthesia-nausea  . Augmentin [Amoxicillin-Pot Clavulanate]     Pt thinks she had nausea with this med when asked 04/27/17  . Cephalexin Itching and Rash  . Ciprofloxacin Itching and Rash  . Lovaza [Omega-3-Acid Ethyl Esters] Other (See Comments)    Reaction=skin bruise    Past Medical History:  Diagnosis Date  . Acute diverticulitis 10/07/2013   With microperforation.  . Bowel obstruction ([place])   . Cataract   . Diverticulosis of colon (without mention of hemorrhage)   . GERD (gastroesophageal reflux disease)   . Hemorrhoids   . HOH (hard of hearing)   . Hyperlipidemia   . Hypertension   . IBS (irritable bowel syndrome)   . Osteopenia   . Osteoporosis   . PONV (postoperative nausea and vomiting)   . Thoracic aortic atherosclerosis ([place])   . UTI (lower urinary tract infection) 10/07/2013    . Vitamin D deficiency     Past Surgical History:  Procedure Laterality Date  . ABDOMINAL HYSTERECTOMY    . APPENDECTOMY    . BILATERAL OOPHORECTOMY    . BREAST LUMPECTOMY Left   . CATARACT EXTRACTION    . COLON RESECTION N/A 12/01/2013   Procedure: HAND ASSISTED LAPAROSCOPIC PARTIAL COLECTOMY;  Surgeon: Jamesetta So, MD;  Location: [facility];  Service: General;  Laterality: N/A;  . EYE SURGERY    . HEMORRHOID SURGERY    . HERNIA REPAIR  09/04/2016   LAPAROSCOPIC VENTRAL INCISIONAL HERNIA REPAIR POSSIBLE OPEN (N/A)  . INCISIONAL HERNIA REPAIR N/A 09/04/2016   Procedure: LAPAROSCOPIC VENTRAL INCISIONAL HERNIA REPAIR POSSIBLE OPEN;  Surgeon: Alphonsa Overall, MD;  Location: [facility];  Service: General;  Laterality: N/A;  . INSERTION OF MESH N/A 09/04/2016   Procedure: INSERTION OF MESH;  Surgeon: Alphonsa Overall, MD;  Location: [facility];  Service: General;  Laterality: N/A;  . PARTIAL HYSTERECTOMY    .  TONSILLECTOMY      Social History   Socioeconomic History  . Marital status: Widowed    Spouse name: Not on file  . Number of children: 3  . Years of education: 9  . Highest education level: Some college, no degree  Occupational History  . Occupation: retired    Fish farm manager: RETIRED  Tobacco Use  . Smoking status: Former Smoker    Packs/day: 1.00    Years: 35.00    Pack years: 35.00    Types: Cigarettes    Quit date: 10/26/2002    Years since quitting: 17.5  . Smokeless tobacco: Never Used  Vaping Use  . Vaping Use: Never used  Substance and Sexual Activity  . Alcohol use: Yes    Alcohol/week: 4.0 standard drinks    Types: 4 Glasses of wine per week    Comment: wine  . Drug use: No  . Sexual activity: Not Currently  Other Topics Concern  . Not on file  Social History Narrative  . Not on file   Social Determinants of Health   Financial Resource Strain: Low Risk   . Difficulty of Paying Living Expenses: Not hard at all  Food Insecurity: No Food Insecurity  . Worried About Paediatric nurse in the Last Year: Never true  . Ran Out of Food in the Last Year: Never true  Transportation Needs: No Transportation Needs  . Lack of Transportation (Medical): No  . Lack of Transportation (Non-Medical): No  Physical Activity: Sufficiently Active  . Days of Exercise per Week: 5 days  . Minutes of Exercise per Session: 30 min  Stress: No Stress Concern Present  . Feeling of Stress : Not at all  Social Connections: Moderately Integrated  . Frequency of Communication with Friends and Family: More than three times a week  . Frequency of Social Gatherings with Friends and Family: More than three times a week  . Attends Religious Services: More than 4 times per year  . Active Member of Clubs or Organizations: Yes  . Attends Archivist Meetings: More than 4 times per year  . Marital Status: Widowed  Intimate Partner Violence: Not At Risk  . Fear of Current or Ex-Partner: No  . Emotionally Abused: No  . Physically Abused: No  . Sexually Abused: No        Objective:    BP 119/61   Pulse 69   Temp (!) 97.3 F (36.3 C) (Temporal)   Ht 5' 1"  (1.549 m)   Wt 114 lb 9.6 oz (52 kg)   SpO2 100%   BMI 21.65 kg/m   Wt Readings from Last 3 Encounters:  05/02/20 114 lb 9.6 oz (52 kg)  11/03/19 117 lb 9.6 oz (53.3 kg)  05/31/19 122 lb (55.3 kg)    Physical Exam Vitals reviewed.  Constitutional:      General: She is not in acute distress.    Appearance: Normal appearance. She is normal weight. She is not ill-appearing, toxic-appearing or diaphoretic.  HENT:     Head: Normocephalic and atraumatic.  Eyes:     General: No scleral icterus.       Right eye: No discharge.        Left eye: No discharge.     Conjunctiva/sclera: Conjunctivae normal.  Cardiovascular:     Rate and Rhythm: Normal rate and regular rhythm.     Heart sounds: Normal heart sounds. No murmur heard.  No friction rub. No gallop.   Pulmonary:  Effort: Pulmonary effort is normal. No  respiratory distress.     Breath sounds: Normal breath sounds. No stridor. No wheezing, rhonchi or rales.  Musculoskeletal:        General: Normal range of motion.     Cervical back: Normal range of motion.  Skin:    General: Skin is warm and dry.     Capillary Refill: Capillary refill takes less than 2 seconds.     Comments: Senile purpura.  Seborrheic keratosis to left forearm.  Neurological:     General: No focal deficit present.     Mental Status: She is alert and oriented to person, place, and time. Mental status is at baseline.  Psychiatric:        Mood and Affect: Mood normal.        Behavior: Behavior normal.        Thought Content: Thought content normal.        Judgment: Judgment normal.     Lab Results  Component Value Date   TSH 1.190 11/03/2019   Lab Results  Component Value Date   WBC 6.3 11/03/2019   HGB 12.4 11/03/2019   HCT 35.9 11/03/2019   MCV 94 11/03/2019   PLT 283 11/03/2019   Lab Results  Component Value Date   NA 136 05/02/2020   K 4.1 05/02/2020   [name] 25 05/02/2020   GLUCOSE 107 (H) 05/02/2020   BUN 14 05/02/2020   CREATININE 0.78 05/02/2020   BILITOT 0.5 05/02/2020   ALKPHOS 45 (L) 05/02/2020   AST 18 05/02/2020   ALT 14 05/02/2020   PROT 6.8 05/02/2020   ALBUMIN 4.9 (H) 05/02/2020   CALCIUM 10.3 05/02/2020   ANIONGAP 8 09/12/2016   Lab Results  Component Value Date   CHOL 201 (H) 11/03/2019   Lab Results  Component Value Date   HDL 60 11/03/2019   Lab Results  Component Value Date   LDLCALC 122 (H) 11/03/2019   Lab Results  Component Value Date   TRIG 108 11/03/2019   Lab Results  Component Value Date   CHOLHDL 3.4 11/03/2019   Lab Results  Component Value Date   HGBA1C 5.6 05/02/2020

## 2020-05-02 NOTE — Patient Instructions (Signed)
Seborrheic Keratosis °A seborrheic keratosis is a common, noncancerous (benign) skin growth. These growths are velvety, waxy, rough, tan, brown, or black spots that appear on the skin. These skin growths can be flat or raised, and scaly. °What are the causes? °The cause of this condition is not known. °What increases the risk? °You are more likely to develop this condition if you: °· Have a family history of seborrheic keratosis. °· Are 50 or older. °· Are pregnant. °· Have had estrogen replacement therapy. °What are the signs or symptoms? °Symptoms of this condition include growths on the face, chest, shoulders, back, or other areas. These growths: °· Are usually painless, but may become irritated and itchy. °· Can be yellow, brown, black, or other colors. °· Are slightly raised or have a flat surface. °· Are sometimes rough or wart-like in texture. °· Are often velvety or waxy on the surface. °· Are round or oval-shaped. °· Often occur in groups, but may occur as a single growth. °How is this diagnosed? °This condition is diagnosed with a medical history and physical exam. °· A sample of the growth may be tested (skin biopsy). °· You may need to see a skin specialist (dermatologist). °How is this treated? °Treatment is not usually needed for this condition, unless the growths are irritated or bleed often. °· You may also choose to have the growths removed if you do not like their appearance. °? Most commonly, these growths are treated with a procedure in which liquid nitrogen is applied to "freeze" off the growth (cryosurgery). °? They may also be burned off with electricity (electrocautery) or removed by scraping (curettage). °Follow these instructions at home: °· Watch your growth for any changes. °· Keep all follow-up visits as told by your health care provider. This is important. °· Do not scratch or pick at the growth or growths. This can cause them to become irritated or infected. °Contact a health care  provider if: °· You suddenly have many new growths. °· Your growth bleeds, itches, or hurts. °· Your growth suddenly becomes larger or changes color. °Summary °· A seborrheic keratosis is a common, noncancerous (benign) skin growth. °· Treatment is not usually needed for this condition, unless the growths are irritated or bleed often. °· Watch your growth for any changes. °· Contact a health care provider if you suddenly have many new growths or your growth suddenly becomes larger or changes color. °· Keep all follow-up visits as told by your health care provider. This is important. °This information is not intended to replace advice given to you by your health care provider. Make sure you discuss any questions you have with your health care provider. °Document Revised: 02/24/2018 Document Reviewed: 02/24/2018 °Elsevier Patient Education © 2020 Elsevier Inc. ° °

## 2020-05-03 ENCOUNTER — Telehealth: Payer: Self-pay | Admitting: *Deleted

## 2020-05-06 LAB — COMPLIANCE DRUG ANALYSIS, UR

## 2020-05-06 NOTE — Telephone Encounter (Signed)
PA in process for zolpidem  KEy [id number] - PA Case ID: [id number]

## 2020-05-07 NOTE — Telephone Encounter (Signed)
Med Approved Pharm aware

## 2020-05-22 ENCOUNTER — Ambulatory Visit (INDEPENDENT_AMBULATORY_CARE_PROVIDER_SITE_OTHER): Payer: Medicare Other | Admitting: Family

## 2020-05-22 ENCOUNTER — Encounter: Payer: Self-pay | Admitting: Family

## 2020-05-22 ENCOUNTER — Other Ambulatory Visit: Payer: Self-pay

## 2020-05-22 VITALS — BP 139/58 | HR 76 | Temp 97.6°F | Ht 61.0 in | Wt 116.0 lb

## 2020-05-22 DIAGNOSIS — S81852A Open bite, left lower leg, initial encounter: Secondary | ICD-10-CM | POA: Diagnosis not present

## 2020-05-22 DIAGNOSIS — W5501XA Bitten by cat, initial encounter: Secondary | ICD-10-CM | POA: Diagnosis not present

## 2020-05-22 MED ORDER — CEFTRIAXONE SODIUM 1 G IJ SOLR
1.0000 g | Freq: Once | INTRAMUSCULAR | Status: AC
  Administered 2020-05-22: 1 g via INTRAMUSCULAR

## 2020-05-22 MED ORDER — DOXYCYCLINE HYCLATE 100 MG PO TABS: 100.0000 mg | ORAL_TABLET | Freq: Two times a day (BID) | ORAL | 0 refills | Status: DC

## 2020-05-22 NOTE — Patient Instructions (Signed)
Animal Bite, Adult Animal bites range from mild to serious. An animal bite can result in any of these injuries:  A scratch.  A deep, open cut.  A puncture of the skin.  A crush injury.  Tearing away of the skin or a body part.  A bone injury. A small bite from a house pet is usually less serious than a bite from a stray or wild animal, such as a raccoon, fox, skunk, or bat. That is because stray and wild animals have a higher risk of carrying a serious infection called rabies, which can be passed to humans through a bite. What increases the risk? You are more likely to be bitten by an animal if:  You are around unfamiliar pets.  You disturb an animal when it is eating, sleeping, or caring for its babies.  You are outdoors in a place where small, wild animals roam freely. What are the signs or symptoms? Common symptoms of an animal bite include:  Pain.  Bleeding.  Swelling.  Bruising. How is this diagnosed? This condition may be diagnosed based on a physical exam and medical history. Your health care provider will examine your wound and ask for details about the animal and how the bite happened. You may also have tests, such as:  Blood tests to check for infection.  X-rays to check for damage to bones or joints.  Taking a fluid sample from your wound and checking it for infection (culture test). How is this treated? Treatment varies depending on the type of animal, where the bite is on your body, and your medical history. Treatment may include:  Caring for the wound. This often includes cleaning the wound, rinsing out (flushing) the wound with saline solution, and applying a bandage (dressing). In some cases, the wound may be closed with stitches (sutures), staples, skin glue, or adhesive strips.  Antibiotic medicine to prevent or treat infection. This medicine may be prescribed in pill or ointment form. If the bite area becomes infected, the medicine may be given through  an IV.  A tetanus shot to prevent tetanus infection.  Rabies treatment to prevent rabies infection. This will be done if the animal could have rabies.  Surgery. This may be done if a bite gets infected or if there is damage that needs to be repaired. Follow these instructions at home: Wound care   Follow instructions from your health care provider about how to take care of your wound. Make sure you: ? Wash your hands with soap and water before you change your dressing. If soap and water are not available, use hand sanitizer. ? Change your dressing as told by your health care provider. ? Leave sutures, skin glue, or adhesive strips in place. These skin closures may need to stay in place for 2 weeks or longer. If adhesive strip edges start to loosen and curl up, you may trim the loose edges. Do not remove adhesive strips completely unless your health care provider tells you to do that.  Check your wound every day for signs of infection. Check for: ? More redness, swelling, or pain. ? More fluid or blood. ? Warmth. ? Pus or a bad smell. Medicines  Take or apply over-the-counter and prescription medicines only as told by your health care provider.  If you were prescribed an antibiotic, take or apply it as told by your health care provider. Do not stop using the antibiotic even if your condition improves. General instructions   Keep the injured   area raised (elevated) above the level of your heart while you are sitting or lying down, if this is possible.  If directed, put ice on the injured area. ? Put ice in a plastic bag. ? Place a towel between your skin and the bag. ? Leave the ice on for 20 minutes, 2-3 times per day.  Keep all follow-up visits as told by your health care provider. This is important. Contact a health care provider if:  You have more redness, swelling, or pain around your wound.  Your wound feels warm to the touch.  You have a fever or chills.  You have a  general feeling of sickness (malaise).  You feel nauseous or you vomit.  You have pain that does not get better. Get help right away if:  You have a red streak that leads away from your wound.  You have non-clear fluid or more blood coming from your wound.  There is pus or a bad smell coming from your wound.  You have trouble moving your injured area.  You have numbness or tingling that extends beyond the wound. Summary  Animal bites can range from mild to serious. An animal bite can cause a scratch on the skin, a deep open cut, a puncture of the skin, a crush injury, tearing away of the skin or a body part, or a bone injury.  Your health care provider will examine your wound and ask for details about the animal and how the bite happened.  You may also have tests such as a blood test, X-ray, or testing of a fluid sample from your wound (culture test).  Treatment may include wound care, antibiotic medicine, a tetanus shot, and rabies treatment if the animal could have rabies. This information is not intended to replace advice given to you by your health care provider. Make sure you discuss any questions you have with your health care provider. Document Revised: 10/07/2017 Document Reviewed: 04/22/2017 Elsevier Patient Education  2020 Elsevier Inc.  

## 2020-05-22 NOTE — Progress Notes (Signed)
   Subjective:    Patient ID: Dorothy Pitts, female    DOB: 07/05/1941, 79 y.o.   MRN: [id number]  Chief Complaint  Patient presents with  . Animal Bite    inside cat up to date on shots     HPI PT presents to the office today with a cat bite on her left lower leg that occurred on over the weekend. She states Sunday night she noticed her lower leg was slightly red, tenderness, and warmth. She reports aching pain of 5 out 10.   She has cleaned the area with alcohol and polysporin cream without relief.    Review of Systems  All other systems reviewed and are negative.      Objective:   Physical Exam Vitals reviewed.  Constitutional:      General: She is not in acute distress.    Appearance: She is well-developed.  HENT:     Head: Normocephalic and atraumatic.  Eyes:     Pupils: Pupils are equal, round, and reactive to light.  Neck:     Thyroid: No thyromegaly.  Cardiovascular:     Rate and Rhythm: Normal rate and regular rhythm.     Heart sounds: Normal heart sounds. No murmur heard.   Pulmonary:     Effort: Pulmonary effort is normal. No respiratory distress.     Breath sounds: Normal breath sounds. No wheezing.  Abdominal:     General: Bowel sounds are normal. There is no distension.     Palpations: Abdomen is soft.     Tenderness: There is no abdominal tenderness.  Musculoskeletal:        General: No tenderness. Normal range of motion.     Cervical back: Normal range of motion and neck supple.  Skin:    General: Skin is warm and dry.     Findings: Erythema present.     Comments: Erythemas of left lower leg that extends up to knee, area marked , heat and tenderness present   Neurological:     Mental Status: She is alert and oriented to person, place, and time.     Cranial Nerves: No cranial nerve deficit.     Deep Tendon Reflexes: Reflexes are normal and symmetric.  Psychiatric:        Behavior: Behavior normal.        Thought Content: Thought content  normal.        Judgment: Judgment normal.      BP (!) 139/58   Pulse 76   Temp 97.6 F (36.4 C) (Temporal)   Ht 5\' 1"  (1.549 m)   Wt 116 lb (52.6 kg)   BMI 21.92 kg/m       Assessment & Plan:  Dorothy Pitts comes in today with chief complaint of Animal Bite (inside cat up to date on shots )   Diagnosis and orders addressed:  1. Cat bite of left lower leg, initial encounter Area marked Strict instructions that if area worsens, call Dorothy Pitts.  Follow up in 1 week to recheck  - doxycycline (VIBRA-TABS) 100 MG tablet; Take 1 tablet (100 mg total) by mouth 2 (two) times daily.  Dispense: 20 tablet; Refill: 0 - cefTRIAXone (ROCEPHIN) injection 1 g   [place], FNP

## 2020-06-03 ENCOUNTER — Other Ambulatory Visit: Payer: Self-pay | Admitting: Family Medicine

## 2020-06-04 ENCOUNTER — Telehealth: Payer: Medicare Other | Admitting: Family Medicine

## 2020-06-27 ENCOUNTER — Other Ambulatory Visit: Payer: Self-pay

## 2020-06-27 ENCOUNTER — Ambulatory Visit (INDEPENDENT_AMBULATORY_CARE_PROVIDER_SITE_OTHER): Payer: Medicare Other | Admitting: *Deleted

## 2020-06-27 DIAGNOSIS — M810 Age-related osteoporosis without current pathological fracture: Secondary | ICD-10-CM | POA: Diagnosis not present

## 2020-06-27 MED ORDER — DENOSUMAB 60 MG/ML ~~LOC~~ SOSY
60.0000 mg | PREFILLED_SYRINGE | Freq: Once | SUBCUTANEOUS | Status: AC
  Administered 2020-06-27: 60 mg via SUBCUTANEOUS

## 2020-06-27 NOTE — Progress Notes (Signed)
Pt tolerated prolia injection without any adverse reactions

## 2020-07-22 ENCOUNTER — Other Ambulatory Visit: Payer: Self-pay

## 2020-07-22 ENCOUNTER — Ambulatory Visit (INDEPENDENT_AMBULATORY_CARE_PROVIDER_SITE_OTHER): Payer: Medicare Other

## 2020-07-22 DIAGNOSIS — M810 Age-related osteoporosis without current pathological fracture: Secondary | ICD-10-CM | POA: Diagnosis not present

## 2020-07-23 ENCOUNTER — Encounter: Payer: Self-pay | Admitting: Family Medicine

## 2020-10-16 ENCOUNTER — Encounter: Payer: Self-pay | Admitting: *Deleted

## 2020-11-04 ENCOUNTER — Ambulatory Visit: Payer: Medicare Other | Admitting: Family Medicine

## 2020-11-06 ENCOUNTER — Other Ambulatory Visit: Payer: Self-pay | Admitting: Family Medicine

## 2020-11-06 DIAGNOSIS — Z79899 Other long term (current) drug therapy: Secondary | ICD-10-CM

## 2020-11-06 DIAGNOSIS — F5101 Primary insomnia: Secondary | ICD-10-CM

## 2020-11-09 ENCOUNTER — Other Ambulatory Visit: Payer: Self-pay | Admitting: Family Medicine

## 2020-11-09 DIAGNOSIS — F5101 Primary insomnia: Secondary | ICD-10-CM

## 2020-11-09 DIAGNOSIS — Z79899 Other long term (current) drug therapy: Secondary | ICD-10-CM

## 2020-11-12 ENCOUNTER — Telehealth: Payer: Self-pay | Admitting: Family Medicine

## 2020-11-12 ENCOUNTER — Ambulatory Visit (INDEPENDENT_AMBULATORY_CARE_PROVIDER_SITE_OTHER): Payer: Medicare Other | Admitting: Family Medicine

## 2020-11-12 ENCOUNTER — Encounter: Payer: Self-pay | Admitting: Family Medicine

## 2020-11-12 DIAGNOSIS — K581 Irritable bowel syndrome with constipation: Secondary | ICD-10-CM | POA: Diagnosis not present

## 2020-11-12 DIAGNOSIS — E559 Vitamin D deficiency, unspecified: Secondary | ICD-10-CM

## 2020-11-12 DIAGNOSIS — R7303 Prediabetes: Secondary | ICD-10-CM

## 2020-11-12 DIAGNOSIS — F5101 Primary insomnia: Secondary | ICD-10-CM

## 2020-11-12 DIAGNOSIS — I700 Atherosclerosis of aorta: Secondary | ICD-10-CM | POA: Diagnosis not present

## 2020-11-12 DIAGNOSIS — I10 Essential (primary) hypertension: Secondary | ICD-10-CM | POA: Diagnosis not present

## 2020-11-12 DIAGNOSIS — E782 Mixed hyperlipidemia: Secondary | ICD-10-CM

## 2020-11-12 DIAGNOSIS — M810 Age-related osteoporosis without current pathological fracture: Secondary | ICD-10-CM

## 2020-11-12 DIAGNOSIS — Z79899 Other long term (current) drug therapy: Secondary | ICD-10-CM

## 2020-11-12 MED ORDER — ENALAPRIL MALEATE 5 MG PO TABS: 5.0000 mg | ORAL_TABLET | Freq: Every day | ORAL | 1 refills | Status: DC

## 2020-11-12 MED ORDER — ROSUVASTATIN CALCIUM 5 MG PO TABS: 2.5000 mg | ORAL_TABLET | ORAL | 1 refills | Status: DC

## 2020-11-12 MED ORDER — ZOLPIDEM TARTRATE 5 MG PO TABS: 2.5000 mg | ORAL_TABLET | Freq: Every evening | ORAL | 2 refills | Status: DC | PRN

## 2020-11-12 MED ORDER — SPIRONOLACTONE 50 MG PO TABS: 50.0000 mg | ORAL_TABLET | Freq: Every day | ORAL | 1 refills | Status: DC

## 2020-11-12 NOTE — Telephone Encounter (Signed)
Patient aware that nurse will contact patient when prolia is received at office and patient will call insurance to check on price of tdap before making an appointment

## 2020-11-12 NOTE — Telephone Encounter (Signed)
Please call patient and discuss Prolia injection with her. She needs to know if we contact her when she is due or if she needs to go ahead and schedule to come get her next dosage.   Please also schedule nurse visit for TDAP.

## 2020-11-12 NOTE — Progress Notes (Signed)
Virtual Visit via Telephone Note  I connected with Dorothy Pitts on 11/12/20 at 10:08 AM by telephone and verified that I am speaking with the correct person using two identifiers. Dorothy Pitts is currently located at home and nobody is currently with her during this visit. The provider, Loman Brooklyn, FNP is located in their home at time of visit.  I discussed the limitations, risks, security and privacy concerns of performing an evaluation and management service by telephone and the availability of in person appointments. I also discussed with the patient that there may be a patient responsible charge related to this service. The patient expressed understanding and agreed to proceed.  Subjective: PCP: Loman Brooklyn, FNP  Chief Complaint  Patient presents with  . Medical Management of Chronic Issues   Hypertension: takes medication as prescribed. Does not check BP at home.   Thoracic Aortic Atherosclerosis: takes statin three times weekly.   Hyperlipidemia: tolerating statin three times weekly.   IBS: controlled with Miralax daily and Colace as needed.  Osteoporosis: patient gets Prolia injections every 6 months. It has been ~4 months since her last injection.   Vitamin D Deficiency: taking vitamin D supplement. Last vitamin D level normal a year ago. She has been normal for several years now.   Insomnia: doing well with 2.5 mg of Ambien nightly. Controlled substance agreement in place. Urine drug screen completed on 05/02/2020 as expected.   Pre-diabetes: last A1c 5.6 six months ago.   ROS: Per HPI  Current Outpatient Medications:  .  Ascorbic Acid (VITAMIN C) 1000 MG tablet, Take 1,000 mg by mouth daily. Reported on 11/21/2015, Disp: , Rfl:  .  aspirin EC 81 MG tablet, Take 81 mg by mouth daily., Disp: , Rfl:  .  Calcium Carbonate-Vitamin D (CALTRATE 600+D) 600-400 MG-UNIT tablet, Take 1 tablet by mouth daily., Disp: 100 tablet, Rfl:  .  Cholecalciferol (VITAMIN  D3) 5000 units CAPS, Take 1 capsule by mouth every Monday, Wednesday, and Friday., Disp: , Rfl:  .  denosumab (PROLIA) 60 MG/ML SOSY injection, Inject 60 mg into the skin every 6 (six) months., Disp: , Rfl:  .  doxycycline (VIBRA-TABS) 100 MG tablet, Take 1 tablet (100 mg total) by mouth 2 (two) times daily., Disp: 20 tablet, Rfl: 0 .  enalapril (VASOTEC) 5 MG tablet, Take 1 tablet (5 mg total) by mouth daily., Disp: 90 tablet, Rfl: 1 .  loratadine (CLARITIN) 10 MG tablet, Take 10 mg by mouth daily., Disp: , Rfl:  .  Polyethylene Glycol 3350 (MIRALAX PO), Take by mouth., Disp: , Rfl:  .  rosuvastatin (CRESTOR) 5 MG tablet, Take 0.5 tablets (2.5 mg total) by mouth every Monday, Wednesday, and Friday., Disp: 18 tablet, Rfl: 1 .  spironolactone (ALDACTONE) 50 MG tablet, Take 1 tablet (50 mg total) by mouth daily., Disp: 90 tablet, Rfl: 1 .  triamcinolone cream (KENALOG) 0.1 %, APPLY TO AFFECTED AREA TWICE A DAY, Disp: 15 g, Rfl: 3 .  zolpidem (AMBIEN) 5 MG tablet, Take 1 tablet (5 mg total) by mouth at bedtime as needed for sleep., Disp: 30 tablet, Rfl: 5  Allergies  Allergen Reactions  . Actonel [Risedronate Sodium] Other (See Comments)    Reaction=Heart burn [place] - caused diarrhea and vomiting  . Fosamax [Alendronate Sodium] Other (See Comments)    Reaction=Heart burn  . Crestor [Rosuvastatin Calcium] Other (See Comments)    Severe myalgias   . Nsaids Swelling    Reaction=Swelling of face  .  Other     General anesthesia-nausea  . Augmentin [Amoxicillin-Pot Clavulanate]     Pt thinks she had nausea with this med when asked 04/27/17  . Cephalexin Itching and Rash  . Ciprofloxacin Itching and Rash  . Lovaza [Omega-3-Acid Ethyl Esters] Other (See Comments)    Reaction=skin bruise    Past Medical History:  Diagnosis Date  . Acute diverticulitis 10/07/2013   With microperforation.  . Bowel obstruction ([place])   . Cataract   . Diverticulosis of colon (without mention of hemorrhage)   .  GERD (gastroesophageal reflux disease)   . Hemorrhoids   . HOH (hard of hearing)   . Hyperlipidemia   . Hypertension   . IBS (irritable bowel syndrome)   . Osteoporosis   . PONV (postoperative nausea and vomiting)   . Thoracic aortic atherosclerosis ([place])   . UTI (lower urinary tract infection) 10/07/2013  . Vitamin D deficiency     Observations/Objective: A&O  No respiratory distress or wheezing audible over the phone Mood, judgement, and thought processes all WNL  Assessment and Plan: 1. Essential hypertension - Well controlled on current regimen.  - enalapril (VASOTEC) 5 MG tablet; Take 1 tablet (5 mg total) by mouth daily.  Dispense: 90 tablet; Refill: 1 - spironolactone (ALDACTONE) 50 MG tablet; Take 1 tablet (50 mg total) by mouth daily.  Dispense: 90 tablet; Refill: 1 - CBC with Differential/Platelet; Future - CMP14+EGFR; Future - Lipid panel; Future  2. Thoracic aortic atherosclerosis ([place]) - Well controlled on current regimen.  - rosuvastatin (CRESTOR) 5 MG tablet; Take 0.5 tablets (2.5 mg total) by mouth every Monday, Wednesday, and Friday.  Dispense: 18 tablet; Refill: 1 - CMP14+EGFR; Future - Lipid panel; Future  3. Mixed hyperlipidemia - Well controlled on current regimen.  - rosuvastatin (CRESTOR) 5 MG tablet; Take 0.5 tablets (2.5 mg total) by mouth every Monday, Wednesday, and Friday.  Dispense: 18 tablet; Refill: 1 - CMP14+EGFR; Future - Lipid panel; Future  4. Irritable bowel syndrome with constipation - Well controlled on current regimen.  - CMP14+EGFR; Future  5. Age-related osteoporosis without current pathological fracture - Well controlled on current regimen. Patient is getting Prolia injections. She is due in ~2 months.   6. Primary insomnia - Well controlled on current regimen.  - zolpidem (AMBIEN) 5 MG tablet; Take 0.5 tablets (2.5 mg total) by mouth at bedtime as needed for sleep.  Dispense: 30 tablet; Refill: 2 - CMP14+EGFR; Future  7.  Controlled substance agreement signed - Controlled substance agreement in place. Urine drug screen up-to-date.  - zolpidem (AMBIEN) 5 MG tablet; Take 0.5 tablets (2.5 mg total) by mouth at bedtime as needed for sleep.  Dispense: 30 tablet; Refill: 2  8. Vitamin D deficiency - Resolved. Patient remains on vitamin D supplement.   9. Pre-diabetes - Resolved.    Follow Up Instructions: Return in about 6 months (around 05/12/2021) for follow-up of chronic medication conditions.  I discussed the assessment and treatment plan with the patient. The patient was provided an opportunity to ask questions and all were answered. The patient agreed with the plan and demonstrated an understanding of the instructions.   The patient was advised to call back or seek an in-person evaluation if the symptoms worsen or if the condition fails to improve as anticipated.  The above assessment and management plan was discussed with the patient. The patient verbalized understanding of and has agreed to the management plan. Patient is aware to call the clinic if symptoms persist or  worsen. Patient is aware when to return to the clinic for a follow-up visit. Patient educated on when it is appropriate to go to the emergency department.   Time call ended: 10:32 AM  I provided 27 minutes of non-face-to-face time during this encounter.  Hendricks Limes, MSN, APRN, FNP-C [facility] 11/12/20

## 2020-12-16 ENCOUNTER — Telehealth: Payer: Self-pay

## 2020-12-16 NOTE — Telephone Encounter (Signed)
Discussed with patient that her prescription is written as 1/2 at bedtime as needed, and this was noted in her last visit that she was doing well on this. She said that she sometimes took it that way. Her last bottle had take 1 at bedtime. We discussed that she has refills but they will not be able to be refilled until March. We have made a televist for Wednesday for her to discuss this with Britney.

## 2020-12-16 NOTE — Telephone Encounter (Signed)
Pt called stating that [facility] is refusing to refill her Ambien, 5mg   Rx.  Pt says she sometimes only takes a half tablet before bed, but says sometimes she takes the whole tablet at bedtime. Just depends on how sleepy she feels, and has done it that way for 15 years.  Says pharmacy is telling her that she is not due for refill but pt is almost out.  Please advise and call patient.

## 2020-12-18 ENCOUNTER — Ambulatory Visit (INDEPENDENT_AMBULATORY_CARE_PROVIDER_SITE_OTHER): Payer: Medicare Other | Admitting: Family Medicine

## 2020-12-18 DIAGNOSIS — F5101 Primary insomnia: Secondary | ICD-10-CM

## 2020-12-18 DIAGNOSIS — Z79899 Other long term (current) drug therapy: Secondary | ICD-10-CM

## 2020-12-18 MED ORDER — ZOLPIDEM TARTRATE 5 MG PO TABS
5.0000 mg | ORAL_TABLET | Freq: Every day | ORAL | 4 refills | Status: DC
Start: 2020-12-18 — End: 2021-06-24

## 2020-12-18 NOTE — Progress Notes (Signed)
Virtual Visit via Telephone Note  I connected with Dorothy Pitts on 12/18/20 at 4:30 PM by telephone and verified that I am speaking with the correct person using two identifiers. Dorothy Pitts is currently located at home and nobody is currently with her during this visit. The provider, Gwenlyn Fudge, FNP is located in their office at time of visit.  I discussed the limitations, risks, security and privacy concerns of performing an evaluation and management service by telephone and the availability of in person appointments. I also discussed with the patient that there may be a patient responsible charge related to this service. The patient expressed understanding and agreed to proceed.  Subjective: PCP: Gwenlyn Fudge, FNP  Chief Complaint  Patient presents with  . Insomnia   Patient reports her last prescription of Ambien have that she was taking half tablet at bedtime.  Patient reports every once in a while she is able to take half of a tablet but most days she takes the whole tablet, which works well for her.  Her concern is that she is unable to get a refill as the pharmacy states this medicine should be lasting 2 months based on the directions.   ROS: Per HPI  Current Outpatient Medications:  .  Ascorbic Acid (VITAMIN C) 1000 MG tablet, Take 1,000 mg by mouth daily. Reported on 11/21/2015, Disp: , Rfl:  .  aspirin EC 81 MG tablet, Take 81 mg by mouth daily., Disp: , Rfl:  .  Calcium Carbonate-Vitamin D (CALTRATE 600+D) 600-400 MG-UNIT tablet, Take 1 tablet by mouth daily., Disp: 100 tablet, Rfl:  .  Cholecalciferol (VITAMIN D3) 5000 units CAPS, Take 1 capsule by mouth every Monday, Wednesday, and Friday., Disp: , Rfl:  .  denosumab (PROLIA) 60 MG/ML SOSY injection, Inject 60 mg into the skin every 6 (six) months., Disp: , Rfl:  .  docusate sodium (COLACE) 100 MG capsule, Take 100 mg by mouth daily as needed., Disp: , Rfl:  .  enalapril (VASOTEC) 5 MG tablet, Take 1  tablet (5 mg total) by mouth daily., Disp: 90 tablet, Rfl: 1 .  famotidine-calcium carbonate-magnesium hydroxide (PEPCID COMPLETE) 10-800-165 MG chewable tablet, Chew 1 tablet by mouth daily., Disp: , Rfl:  .  loratadine (CLARITIN) 10 MG tablet, Take 10 mg by mouth daily., Disp: , Rfl:  .  nystatin cream (MYCOSTATIN), Apply 1 application topically 2 (two) times daily., Disp: , Rfl:  .  Polyethylene Glycol 3350 (MIRALAX PO), Take 1 Dose by mouth daily., Disp: , Rfl:  .  Probiotic Product (ALIGN PO), Take 1 tablet by mouth daily., Disp: , Rfl:  .  rosuvastatin (CRESTOR) 5 MG tablet, Take 0.5 tablets (2.5 mg total) by mouth every Monday, Wednesday, and Friday., Disp: 18 tablet, Rfl: 1 .  spironolactone (ALDACTONE) 50 MG tablet, Take 1 tablet (50 mg total) by mouth daily., Disp: 90 tablet, Rfl: 1 .  triamcinolone cream (KENALOG) 0.1 %, APPLY TO AFFECTED AREA TWICE A DAY, Disp: 15 g, Rfl: 3 .  vitamin B-12 (CYANOCOBALAMIN) 1000 MCG tablet, Take 1,000 mcg by mouth daily., Disp: , Rfl:  .  zolpidem (AMBIEN) 5 MG tablet, Take 0.5 tablets (2.5 mg total) by mouth at bedtime as needed for sleep., Disp: 30 tablet, Rfl: 2  Allergies  Allergen Reactions  . Actonel [Risedronate Sodium] Other (See Comments)    Reaction=Heart burn Atelvia - caused diarrhea and vomiting  . Fosamax [Alendronate Sodium] Other (See Comments)    Reaction=Heart burn  .  Crestor [Rosuvastatin Calcium] Other (See Comments)    Severe myalgias   . Nsaids Swelling    Reaction=Swelling of face  . Other     General anesthesia-nausea  . Augmentin [Amoxicillin-Pot Clavulanate]     Pt thinks she had nausea with this med when asked 04/27/17  . Cephalexin Itching and Rash  . Ciprofloxacin Itching and Rash  . Lovaza [Omega-3-Acid Ethyl Esters] Other (See Comments)    Reaction=skin bruise    Past Medical History:  Diagnosis Date  . Acute diverticulitis 10/07/2013   With microperforation.  . Anemia 10/08/2013  . Bowel obstruction (HCC)    . Cataract   . Diverticulosis of colon (without mention of hemorrhage)   . GERD (gastroesophageal reflux disease)   . Hemorrhoids   . HOH (hard of hearing)   . Hyperlipidemia   . Hypertension   . IBS (irritable bowel syndrome)   . Osteoporosis   . PONV (postoperative nausea and vomiting)   . Thoracic aortic atherosclerosis (HCC)   . UTI (lower urinary tract infection) 10/07/2013  . Vitamin D deficiency     Observations/Objective: A&O  No respiratory distress or wheezing audible over the phone Mood, judgement, and thought processes all WNL   Assessment and Plan: 1. Primary insomnia New Rx sent with directions to take 5 mg at bedtime. - zolpidem (AMBIEN) 5 MG tablet; Take 1 tablet (5 mg total) by mouth at bedtime.  Dispense: 30 tablet; Refill: 4  2. Controlled substance agreement signed Controlled substance agreement in place for Ambien.  Urine drug screen up-to-date.  PDMP reviewed with no concerning findings. - zolpidem (AMBIEN) 5 MG tablet; Take 1 tablet (5 mg total) by mouth at bedtime.  Dispense: 30 tablet; Refill: 4   Follow Up Instructions: I discussed the assessment and treatment plan with the patient. The patient was provided an opportunity to ask questions and all were answered. The patient agreed with the plan and demonstrated an understanding of the instructions.   The patient was advised to call back or seek an in-person evaluation if the symptoms worsen or if the condition fails to improve as anticipated.  The above assessment and management plan was discussed with the patient. The patient verbalized understanding of and has agreed to the management plan. Patient is aware to call the clinic if symptoms persist or worsen. Patient is aware when to return to the clinic for a follow-up visit. Patient educated on when it is appropriate to go to the emergency department.   Time call ended: 4:35 PM  I provided 5 minutes of non-face-to-face time during this  encounter.  Deliah Boston, MSN, APRN, FNP-C [facility] 12/18/20

## 2020-12-19 ENCOUNTER — Encounter: Payer: Self-pay | Admitting: Family Medicine

## 2020-12-23 ENCOUNTER — Other Ambulatory Visit: Payer: Medicare Other

## 2020-12-23 ENCOUNTER — Telehealth: Payer: Self-pay

## 2020-12-23 ENCOUNTER — Other Ambulatory Visit: Payer: Self-pay

## 2020-12-23 DIAGNOSIS — K581 Irritable bowel syndrome with constipation: Secondary | ICD-10-CM

## 2020-12-23 DIAGNOSIS — I10 Essential (primary) hypertension: Secondary | ICD-10-CM

## 2020-12-23 DIAGNOSIS — F5101 Primary insomnia: Secondary | ICD-10-CM

## 2020-12-23 DIAGNOSIS — E782 Mixed hyperlipidemia: Secondary | ICD-10-CM

## 2020-12-23 DIAGNOSIS — I700 Atherosclerosis of aorta: Secondary | ICD-10-CM

## 2020-12-24 ENCOUNTER — Other Ambulatory Visit: Payer: Self-pay | Admitting: Family Medicine

## 2020-12-24 DIAGNOSIS — I700 Atherosclerosis of aorta: Secondary | ICD-10-CM

## 2020-12-24 DIAGNOSIS — E782 Mixed hyperlipidemia: Secondary | ICD-10-CM

## 2020-12-24 LAB — CMP14+EGFR
ALT: 17 IU/L (ref 0–32)
AST: 21 IU/L (ref 0–40)
Albumin/Globulin Ratio: 2.2 (ref 1.2–2.2)
Albumin: 4.7 g/dL (ref 3.7–4.7)
Alkaline Phosphatase: 43 IU/L — ABNORMAL LOW (ref 44–121)
BUN/Creatinine Ratio: 24 (ref 12–28)
BUN: 19 mg/dL (ref 8–27)
Bilirubin Total: 0.6 mg/dL (ref 0.0–1.2)
CO2: 23 mmol/L (ref 20–29)
Calcium: 10.8 mg/dL — ABNORMAL HIGH (ref 8.7–10.3)
Chloride: 98 mmol/L (ref 96–106)
Creatinine, Ser: 0.78 mg/dL (ref 0.57–1.00)
Globulin, Total: 2.1 g/dL (ref 1.5–4.5)
Glucose: 109 mg/dL — ABNORMAL HIGH (ref 65–99)
Potassium: 4.4 mmol/L (ref 3.5–5.2)
Sodium: 139 mmol/L (ref 134–144)
Total Protein: 6.8 g/dL (ref 6.0–8.5)
eGFR: 77 mL/min/{1.73_m2} (ref >59)

## 2020-12-24 LAB — CBC WITH DIFFERENTIAL/PLATELET
Basophils Absolute: 0 10*3/uL (ref 0.0–0.2)
Basos: 1 %
EOS (ABSOLUTE): 0.2 10*3/uL (ref 0.0–0.4)
Eos: 4 %
Hematocrit: 42.1 % (ref 34.0–46.6)
Hemoglobin: 14.1 g/dL (ref 11.1–15.9)
Immature Grans (Abs): 0 10*3/uL (ref 0.0–0.1)
Immature Granulocytes: 0 %
Lymphocytes Absolute: 1.7 10*3/uL (ref 0.7–3.1)
Lymphs: 32 %
MCH: 31.9 pg (ref 26.6–33.0)
MCHC: 33.5 g/dL (ref 31.5–35.7)
MCV: 95 fL (ref 79–97)
Monocytes Absolute: 0.6 10*3/uL (ref 0.1–0.9)
Monocytes: 10 %
Neutrophils Absolute: 2.9 10*3/uL (ref 1.4–7.0)
Neutrophils: 53 %
Platelets: 254 10*3/uL (ref 150–450)
RBC: 4.42 x10E6/uL (ref 3.77–5.28)
RDW: 12 % (ref 11.7–15.4)
WBC: 5.4 10*3/uL (ref 3.4–10.8)

## 2020-12-24 LAB — LIPID PANEL
Chol/HDL Ratio: 4.6 ratio — ABNORMAL HIGH (ref 0.0–4.4)
Cholesterol, Total: 290 mg/dL — ABNORMAL HIGH (ref 100–199)
HDL: 63 mg/dL (ref >39)
LDL Chol Calc (NIH): 204 mg/dL — ABNORMAL HIGH (ref 0–99)
Triglycerides: 130 mg/dL (ref 0–149)
VLDL Cholesterol Cal: 23 mg/dL (ref 5–40)

## 2020-12-24 MED ORDER — ROSUVASTATIN CALCIUM 5 MG PO TABS: 5.0000 mg | ORAL_TABLET | Freq: Every day | ORAL | 2 refills | Status: DC

## 2020-12-24 NOTE — Telephone Encounter (Signed)
Patient aware prolia is due this month and we will contact her.

## 2020-12-30 ENCOUNTER — Other Ambulatory Visit: Payer: Self-pay

## 2020-12-30 ENCOUNTER — Encounter: Payer: Self-pay | Admitting: Family Medicine

## 2020-12-30 ENCOUNTER — Ambulatory Visit (INDEPENDENT_AMBULATORY_CARE_PROVIDER_SITE_OTHER): Payer: Medicare Other | Admitting: Family Medicine

## 2020-12-30 VITALS — BP 124/71 | HR 72 | Temp 97.3°F | Ht 61.0 in | Wt 118.4 lb

## 2020-12-30 DIAGNOSIS — R222 Localized swelling, mass and lump, trunk: Secondary | ICD-10-CM | POA: Diagnosis not present

## 2020-12-30 DIAGNOSIS — R1031 Right lower quadrant pain: Secondary | ICD-10-CM

## 2020-12-30 DIAGNOSIS — Z23 Encounter for immunization: Secondary | ICD-10-CM

## 2020-12-30 DIAGNOSIS — E559 Vitamin D deficiency, unspecified: Secondary | ICD-10-CM | POA: Diagnosis not present

## 2020-12-30 NOTE — Progress Notes (Signed)
Assessment & Plan:  1. Right lower quadrant pain - CT Abdomen Pelvis W Contrast; Future  2. Abdominal wall mass - CT Abdomen Pelvis W Contrast; Future  3. Vitamin D deficiency - VITAMIN D 25 Hydroxy (Vit-D Deficiency, Fractures)  4. Immunization due - Tdap vaccine greater than or equal to 80yo IM - given in office.   Return if symptoms worsen or fail to improve.  Deliah Boston, MSN, APRN, FNP-C [facility]  Subjective:    Patient ID: Dorothy Pitts, female    DOB: 06-08-1941, 80 y.o.   MRN: [id number]  Patient Care Team: Gwenlyn Fudge, FNP as PCP - General (Family Medicine) Pyrtle, Carie Caddy, MD as Consulting Physician (Gastroenterology) Ovidio Kin, MD as Consulting Physician (General Surgery) Annamaria Helling, MD as Consulting Physician (Obstetrics and Gynecology)   Chief Complaint:  Chief Complaint  Patient presents with  . hernia    Right side of abdomen- patient states it has been there x 2 years but now it growing and having pain.    HPI: Dorothy Pitts is a 80 y.o. female presenting on 12/30/2020 for hernia (Right side of abdomen- patient states it has been there x 2 years but now it growing and having pain.)  Patient feels she has a hernia in the right lower abdomen. This area was imaged in 2020. The ultrasound showed a nodular density in this area but the CT scan did not show a hernia.   She reports for the same amount of time she has right groin pain intermittently. It mostly occurs when she gets up after sitting for prolonged periods of time. She describes the pain as "it just hurts". Denies and radiation down her leg or stiffness.   She would like repeat imaging.  Patient would like her vitamin D level checked.    Social history:  Relevant past medical, surgical, family and social history reviewed and updated as indicated. Interim medical history since our last visit reviewed.  Allergies and medications reviewed and  updated.  DATA REVIEWED: CHART IN EPIC  ROS: Negative unless specifically indicated above in HPI.    Current Outpatient Medications:  .  Ascorbic Acid (VITAMIN C) 1000 MG tablet, Take 1,000 mg by mouth daily. Reported on 11/21/2015, Disp: , Rfl:  .  aspirin EC 81 MG tablet, Take 81 mg by mouth daily., Disp: , Rfl:  .  Calcium Carbonate-Vitamin D (CALTRATE 600+D) 600-400 MG-UNIT tablet, Take 1 tablet by mouth daily., Disp: 100 tablet, Rfl:  .  Cholecalciferol (VITAMIN D3) 5000 units CAPS, Take 1 capsule by mouth every Monday, Wednesday, and Friday., Disp: , Rfl:  .  denosumab (PROLIA) 60 MG/ML SOSY injection, Inject 60 mg into the skin every 6 (six) months., Disp: , Rfl:  .  docusate sodium (COLACE) 100 MG capsule, Take 100 mg by mouth daily as needed., Disp: , Rfl:  .  enalapril (VASOTEC) 5 MG tablet, Take 1 tablet (5 mg total) by mouth daily., Disp: 90 tablet, Rfl: 1 .  famotidine-calcium carbonate-magnesium hydroxide (PEPCID COMPLETE) 10-800-165 MG chewable tablet, Chew 1 tablet by mouth daily., Disp: , Rfl:  .  loratadine (CLARITIN) 10 MG tablet, Take 10 mg by mouth daily., Disp: , Rfl:  .  nystatin cream (MYCOSTATIN), Apply 1 application topically 2 (two) times daily., Disp: , Rfl:  .  Polyethylene Glycol 3350 (MIRALAX PO), Take 1 Dose by mouth daily., Disp: , Rfl:  .  Probiotic Product (ALIGN PO), Take 1 tablet by mouth daily., Disp: ,  Rfl:  .  rosuvastatin (CRESTOR) 5 MG tablet, Take 1 tablet (5 mg total) by mouth daily., Disp: 30 tablet, Rfl: 2 .  spironolactone (ALDACTONE) 50 MG tablet, Take 1 tablet (50 mg total) by mouth daily., Disp: 90 tablet, Rfl: 1 .  triamcinolone cream (KENALOG) 0.1 %, APPLY TO AFFECTED AREA TWICE A DAY, Disp: 15 g, Rfl: 3 .  vitamin B-12 (CYANOCOBALAMIN) 1000 MCG tablet, Take 1,000 mcg by mouth daily., Disp: , Rfl:  .  zolpidem (AMBIEN) 5 MG tablet, Take 1 tablet (5 mg total) by mouth at bedtime., Disp: 30 tablet, Rfl: 4   Allergies  Allergen Reactions  .  Actonel [Risedronate Sodium] Other (See Comments)    Reaction=Heart burn Atelvia - caused diarrhea and vomiting  . Fosamax [Alendronate Sodium] Other (See Comments)    Reaction=Heart burn  . Crestor [Rosuvastatin Calcium] Other (See Comments)    Severe myalgias   . Nsaids Swelling    Reaction=Swelling of face  . Other     General anesthesia-nausea  . Augmentin [Amoxicillin-Pot Clavulanate]     Pt thinks she had nausea with this med when asked 04/27/17  . Cephalexin Itching and Rash  . Ciprofloxacin Itching and Rash  . Lovaza [Omega-3-Acid Ethyl Esters] Other (See Comments)    Reaction=skin bruise    Past Medical History:  Diagnosis Date  . Acute diverticulitis 10/07/2013   With microperforation.  . Anemia 10/08/2013  . Bowel obstruction (HCC)   . Cataract   . Diverticulosis of colon (without mention of hemorrhage)   . GERD (gastroesophageal reflux disease)   . Hemorrhoids   . HOH (hard of hearing)   . Hyperlipidemia   . Hypertension   . IBS (irritable bowel syndrome)   . Osteoporosis   . PONV (postoperative nausea and vomiting)   . Thoracic aortic atherosclerosis (HCC)   . UTI (lower urinary tract infection) 10/07/2013  . Vitamin D deficiency     Past Surgical History:  Procedure Laterality Date  . ABDOMINAL HYSTERECTOMY    . APPENDECTOMY    . BILATERAL OOPHORECTOMY    . BREAST LUMPECTOMY Left   . CATARACT EXTRACTION    . COLON RESECTION N/A 12/01/2013   Procedure: HAND ASSISTED LAPAROSCOPIC PARTIAL COLECTOMY;  Surgeon: Dalia Heading, MD;  Location: [facility];  Service: General;  Laterality: N/A;  . EYE SURGERY    . HEMORRHOID SURGERY    . HERNIA REPAIR  09/04/2016   LAPAROSCOPIC VENTRAL INCISIONAL HERNIA REPAIR POSSIBLE OPEN (N/A)  . INCISIONAL HERNIA REPAIR N/A 09/04/2016   Procedure: LAPAROSCOPIC VENTRAL INCISIONAL HERNIA REPAIR POSSIBLE OPEN;  Surgeon: Ovidio Kin, MD;  Location: [facility] OR;  Service: General;  Laterality: N/A;  . INSERTION OF MESH N/A 09/04/2016    Procedure: INSERTION OF MESH;  Surgeon: Ovidio Kin, MD;  Location: [facility] OR;  Service: General;  Laterality: N/A;  . PARTIAL HYSTERECTOMY    . TONSILLECTOMY      Social History   Socioeconomic History  . Marital status: Widowed    Spouse name: Not on file  . Number of children: 3  . Years of education: 20  . Highest education level: Some college, no degree  Occupational History  . Occupation: retired    Associate Professor: RETIRED  Tobacco Use  . Smoking status: Former Smoker    Packs/day: 1.00    Years: 35.00    Pack years: 35.00    Types: Cigarettes    Quit date: 10/26/2002    Years since quitting: 18.1  . Smokeless tobacco:  Never Used  Vaping Use  . Vaping Use: Never used  Substance and Sexual Activity  . Alcohol use: Yes    Alcohol/week: 4.0 standard drinks    Types: 4 Glasses of wine per week    Comment: wine  . Drug use: No  . Sexual activity: Not Currently  Other Topics Concern  . Not on file  Social History Narrative  . Not on file   Social Determinants of Health   Financial Resource Strain: Not on file  Food Insecurity: Not on file  Transportation Needs: Not on file  Physical Activity: Not on file  Stress: Not on file  Social Connections: Not on file  Intimate Partner Violence: Not on file        Objective:    BP (!) 154/61   Pulse 72   Temp (!) 97.3 F (36.3 C) (Temporal)   Ht 5\' 1"  (1.549 m)   Wt 118 lb 6.4 oz (53.7 kg)   SpO2 97%   BMI 22.37 kg/m   Wt Readings from Last 3 Encounters:  12/30/20 118 lb 6.4 oz (53.7 kg)  05/22/20 116 lb (52.6 kg)  05/02/20 114 lb 9.6 oz (52 kg)    Physical Exam Vitals reviewed.  Constitutional:      General: She is not in acute distress.    Appearance: Normal appearance. She is not ill-appearing, toxic-appearing or diaphoretic.  HENT:     Head: Normocephalic and atraumatic.  Eyes:     General: No scleral icterus.       Right eye: No discharge.        Left eye: No discharge.     Conjunctiva/sclera:  Conjunctivae normal.  Cardiovascular:     Rate and Rhythm: Normal rate.  Pulmonary:     Effort: Pulmonary effort is normal. No respiratory distress.  Abdominal:     General: Abdomen is flat.     Palpations: Abdomen is soft. There is no hepatomegaly or splenomegaly.     Tenderness: There is no abdominal tenderness.     Comments: Small mass RLQ.  Musculoskeletal:        General: Normal range of motion.     Cervical back: Normal range of motion.     Right hip: Normal.  Skin:    General: Skin is warm and dry.     Capillary Refill: Capillary refill takes less than 2 seconds.  Neurological:     General: No focal deficit present.     Mental Status: She is alert and oriented to person, place, and time. Mental status is at baseline.  Psychiatric:        Mood and Affect: Mood normal.        Behavior: Behavior normal.        Thought Content: Thought content normal.        Judgment: Judgment normal.     Lab Results  Component Value Date   TSH 1.190 11/03/2019   Lab Results  Component Value Date   WBC 5.4 12/23/2020   HGB 14.1 12/23/2020   HCT 42.1 12/23/2020   MCV 95 12/23/2020   PLT 254 12/23/2020   Lab Results  Component Value Date   NA 139 12/23/2020   K 4.4 12/23/2020   [name] 23 12/23/2020   GLUCOSE 109 (H) 12/23/2020   BUN 19 12/23/2020   CREATININE 0.78 12/23/2020   BILITOT 0.6 12/23/2020   ALKPHOS 43 (L) 12/23/2020   AST 21 12/23/2020   ALT 17 12/23/2020   PROT 6.8 12/23/2020  ALBUMIN 4.7 12/23/2020   CALCIUM 10.8 (H) 12/23/2020   ANIONGAP 8 09/12/2016   Lab Results  Component Value Date   CHOL 290 (H) 12/23/2020   Lab Results  Component Value Date   HDL 63 12/23/2020   Lab Results  Component Value Date   LDLCALC 204 (H) 12/23/2020   Lab Results  Component Value Date   TRIG 130 12/23/2020   Lab Results  Component Value Date   CHOLHDL 4.6 (H) 12/23/2020   Lab Results  Component Value Date   HGBA1C 5.6 05/02/2020

## 2020-12-31 LAB — VITAMIN D 25 HYDROXY (VIT D DEFICIENCY, FRACTURES): Vit D, 25-Hydroxy: 67.7 ng/mL (ref 30.0–100.0)

## 2021-01-06 ENCOUNTER — Ambulatory Visit (INDEPENDENT_AMBULATORY_CARE_PROVIDER_SITE_OTHER): Payer: Medicare Other | Admitting: *Deleted

## 2021-01-06 DIAGNOSIS — Z0000 Encounter for general adult medical examination without abnormal findings: Secondary | ICD-10-CM | POA: Diagnosis not present

## 2021-01-06 NOTE — Patient Instructions (Signed)
  MEDICARE ANNUAL WELLNESS VISIT Health Maintenance Summary and Written Plan of Care  Dorothy Pitts ,  Thank you for allowing me to perform your Medicare Annual Wellness Visit and for your ongoing commitment to your health.   Health Maintenance & Immunization History Health Maintenance  Topic Date Due  . INFLUENZA VACCINE  01/23/2021 (Originally 05/26/2020)  . Hepatitis C Screening  05/02/2021 (Originally 01/05/1941)  . PNA vac Low Risk Adult (1 of 2 - PCV13) 09/28/2021 (Originally 07/09/2006)  . MAMMOGRAM  01/14/2021  . DEXA SCAN  07/22/2022  . TETANUS/TDAP  12/31/2030  . HPV VACCINES  Aged Out  . COVID-19 Vaccine  Discontinued   Immunization History  Administered Date(s) Administered  . Td 04/19/2006  . Tdap 12/30/2020  . Zoster 12/24/2009    These are the patient goals that we discussed: Goals Addressed            This Visit's Progress   . AWV       01/06/2021 AWV Goal: Fall Prevention  . Over the next year, patient will decrease their risk for falls by: o Using assistive devices, such as a cane or walker, as needed o Identifying fall risks within their home and correcting them by: - Removing throw rugs - Adding handrails to stairs or ramps - Removing clutter and keeping a clear pathway throughout the home - Increasing light, especially at night - Adding shower handles/bars - Raising toilet seat o Identifying potential personal risk factors for falls: - Medication side effects - Incontinence/urgency - Vestibular dysfunction - Hearing loss - Musculoskeletal disorders - Neurological disorders - Orthostatic hypotension          This is a list of Health Maintenance Items that are overdue or due now: There are no preventive care reminders to display for this patient.   Orders/Referrals Placed Today: No orders of the defined types were placed in this encounter.  (Contact our referral department at [phone] if you have not spoken with someone about your  referral appointment within the next 5 days)    Follow-up Plan . Follow-up with Gwenlyn Fudge, FNP as planned . Schedule your yearly eye exam

## 2021-01-06 NOTE — Progress Notes (Signed)
MEDICARE ANNUAL WELLNESS VISIT  01/06/2021  Telephone Visit Disclaimer This Medicare AWV was conducted by telephone due to national recommendations for restrictions regarding the COVID-19 Pandemic (e.g. social distancing).  I verified, using two identifiers, that I am speaking with [name] or their authorized healthcare agent. I discussed the limitations, risks, security, and privacy concerns of performing an evaluation and management service by telephone and the potential availability of an in-person appointment in the future. The patient expressed understanding and agreed to proceed.  Location of Patient: Home  Location of Provider (nurse):  [facility]  Subjective:    NATHANIA WALDMAN is a 80 y.o. female patient of Gwenlyn Fudge, FNP who had a Medicare Annual Wellness Visit today via telephone. [place] is Retired and lives with her son and grandson. She has 3 children. She reports that she is socially active and does interact with friends/family regularly. she is minimally physically active and enjoys playing golf, walking, going to the beach, and spending time with her family.  Patient Care Team: Gwenlyn Fudge, FNP as PCP - General (Family Medicine) Pyrtle, Carie Caddy, MD as Consulting Physician (Gastroenterology) Ovidio Kin, MD as Consulting Physician (General Surgery) Annamaria Helling, MD as Consulting Physician (Obstetrics and Gynecology)  Advanced Directives 01/06/2021 09/04/2019 08/23/2018 09/12/2016 09/04/2016 08/26/2016 11/21/2015  Does Patient Have a Medical Advance Directive? Yes No No No No No No  Type of Estate agent of [place];Living will - - - - - -  Does patient want to make changes to medical advance directive? No - Patient declined - - - - - -  Copy of Healthcare Power of Attorney in Chart? No - copy requested - - - - - -  Would patient like information on creating a medical advance directive? - No - Patient  declined Yes (MAU/Ambulatory/Procedural Areas - Information given) No - patient declined information No - patient declined information Yes - Educational materials given Yes - Transport planner given  Pre-existing out of facility DNR order (yellow form or pink MOST form) - - - - - - -    Hospital Utilization Over the Past 12 Months: # of hospitalizations or ER visits: 0 # of surgeries: 0  Review of Systems    Patient reports that her overall health is unchanged compared to last year.  History obtained from chart review and the patient  Patient Reported Readings (BP, Pulse, CBG, Weight, etc) none  Pain Assessment Pain : No/denies pain     Current Medications & Allergies (verified) Allergies as of 01/06/2021      Reactions   Actonel [risedronate Sodium] Other (See Comments)   Reaction=Heart burn Atelvia - caused diarrhea and vomiting   Fosamax [alendronate Sodium] Other (See Comments)   Reaction=Heart burn   Crestor [rosuvastatin Calcium] Other (See Comments)   Severe myalgias   Nsaids Swelling   Reaction=Swelling of face   Other    General anesthesia-nausea   Augmentin [amoxicillin-pot Clavulanate]    Pt thinks she had nausea with this med when asked 04/27/17   Cephalexin Itching, Rash   Ciprofloxacin Itching, Rash   Lovaza [omega-3-acid Ethyl Esters] Other (See Comments)   Reaction=skin bruise       Medication List       Accurate as of January 06, 2021  3:06 PM. If you have any questions, ask your nurse or doctor.        STOP taking these medications   aspirin EC 81 MG tablet  docusate sodium 100 MG capsule Commonly known as: COLACE     TAKE these medications   ALIGN PO Take 1 tablet by mouth daily.   Calcium Carbonate-Vitamin D 600-400 MG-UNIT tablet Commonly known as: Caltrate 600+D Take 1 tablet by mouth daily.   denosumab 60 MG/ML Sosy injection Commonly known as: PROLIA Inject 60 mg into the skin every 6 (six) months.   enalapril 5 MG  tablet Commonly known as: VASOTEC Take 1 tablet (5 mg total) by mouth daily.   famotidine-calcium carbonate-magnesium hydroxide 10-800-165 MG chewable tablet Commonly known as: PEPCID COMPLETE Chew 1 tablet by mouth daily.   loratadine 10 MG tablet Commonly known as: CLARITIN Take 10 mg by mouth daily.   MIRALAX PO Take 1 Dose by mouth daily.   nystatin cream Commonly known as: MYCOSTATIN Apply 1 application topically 2 (two) times daily.   rosuvastatin 5 MG tablet Commonly known as: CRESTOR Take 1 tablet (5 mg total) by mouth daily.   spironolactone 50 MG tablet Commonly known as: ALDACTONE Take 1 tablet (50 mg total) by mouth daily.   triamcinolone 0.1 % Commonly known as: KENALOG APPLY TO AFFECTED AREA TWICE A DAY   vitamin B-12 1000 MCG tablet Commonly known as: CYANOCOBALAMIN Take 1,000 mcg by mouth daily.   vitamin C 1000 MG tablet Take 1,000 mg by mouth daily. Reported on 11/21/2015   Vitamin D3 125 MCG (5000 UT) Caps Take 1 capsule by mouth every Monday, Wednesday, and Friday.   zolpidem 5 MG tablet Commonly known as: AMBIEN Take 1 tablet (5 mg total) by mouth at bedtime.       History (reviewed): Past Medical History:  Diagnosis Date  . Acute diverticulitis 10/07/2013   With microperforation.  . Anemia 10/08/2013  . Bowel obstruction (HCC)   . Cataract   . Diverticulosis of colon (without mention of hemorrhage)   . GERD (gastroesophageal reflux disease)   . Hemorrhoids   . HOH (hard of hearing)   . Hyperlipidemia   . Hypertension   . IBS (irritable bowel syndrome)   . Osteoporosis   . PONV (postoperative nausea and vomiting)   . Thoracic aortic atherosclerosis (HCC)   . UTI (lower urinary tract infection) 10/07/2013  . Vitamin D deficiency    Past Surgical History:  Procedure Laterality Date  . ABDOMINAL HYSTERECTOMY    . APPENDECTOMY    . BILATERAL OOPHORECTOMY    . BREAST LUMPECTOMY Left   . CATARACT EXTRACTION    . COLON RESECTION  N/A 12/01/2013   Procedure: HAND ASSISTED LAPAROSCOPIC PARTIAL COLECTOMY;  Surgeon: Dalia Heading, MD;  Location: [facility];  Service: General;  Laterality: N/A;  . EYE SURGERY    . HEMORRHOID SURGERY    . HERNIA REPAIR  09/04/2016   LAPAROSCOPIC VENTRAL INCISIONAL HERNIA REPAIR POSSIBLE OPEN (N/A)  . INCISIONAL HERNIA REPAIR N/A 09/04/2016   Procedure: LAPAROSCOPIC VENTRAL INCISIONAL HERNIA REPAIR POSSIBLE OPEN;  Surgeon: Ovidio Kin, MD;  Location[facility];  Service: General;  Laterality: N/A;  . INSERTION OF MESH N/A 09/04/2016   Procedure: INSERTION OF MESH;  Surgeon: Ovidio Kin, MD;  Location: [facility] OR;  Service: General;  Laterality: N/A;  . PARTIAL HYSTERECTOMY    . TONSILLECTOMY     Family History  Problem Relation Age of Onset  . Hypertension Mother   . Heart attack Mother 44  . Parkinson's disease Father   . Heart disease Brother 45       bypass  . Obesity Brother   .  Asthma Son   . Other Son        Gout  . Colon cancer Neg Hx    Social History   Socioeconomic History  . Marital status: Widowed    Spouse name: Not on file  . Number of children: 3  . Years of education: 915  . Highest education level: Some college, no degree  Occupational History  . Occupation: retired    Associate Professormployer: RETIRED  Tobacco Use  . Smoking status: Former Smoker    Packs/day: 1.00    Years: 35.00    Pack years: 35.00    Types: Cigarettes    Quit date: 10/26/2002    Years since quitting: 18.2  . Smokeless tobacco: Never Used  Vaping Use  . Vaping Use: Never used  Substance and Sexual Activity  . Alcohol use: Yes    Alcohol/week: 4.0 standard drinks    Types: 4 Glasses of wine per week    Comment: wine  . Drug use: No  . Sexual activity: Not Currently  Other Topics Concern  . Not on file  Social History Narrative  . Not on file   Social Determinants of Health   Financial Resource Strain: Not on file  Food Insecurity: Not on file  Transportation Needs: Not on file  Physical  Activity: Not on file  Stress: Not on file  Social Connections: Not on file    Activities of Daily Living In your present state of health, do you have any difficulty performing the following activities: 01/06/2021  Hearing? Y  Comment Wears hearing aids  Vision? N  Comment Wears glasses  Difficulty concentrating or making decisions? N  Walking or climbing stairs? N  Dressing or bathing? N  Doing errands, shopping? N  Preparing Food and eating ? N  Using the Toilet? N  In the past six months, have you accidently leaked urine? N  Do you have problems with loss of bowel control? N  Managing your Medications? N  Managing your Finances? N  Housekeeping or managing your Housekeeping? N  Some recent data might be hidden    Patient Education/ Literacy How often do you need to have someone help you when you read instructions, pamphlets, or other written materials from your doctor or pharmacy?: 1 - Never What is the last grade level you completed in school?: Some College  Exercise Current Exercise Habits: The patient does not participate in regular exercise at present;Home exercise routine (Plays golf), Type of exercise: walking, Time (Minutes): 35, Frequency (Times/Week): 4, Weekly Exercise (Minutes/Week): 140, Intensity: Mild  Diet Patient reports consuming 2 meals a day and 2 snack(s) a day Patient reports that her primary diet is: Regular Patient reports that she does have regular access to food.   Depression Screen PHQ 2/9 Scores 01/06/2021 05/22/2020 05/02/2020 11/03/2019 09/04/2019 05/31/2019 04/10/2019  PHQ - 2 Score 0 0 0 0 0 0 0     Fall Risk Fall Risk  01/06/2021 05/22/2020 05/02/2020 11/03/2019 09/04/2019  Falls in the past year? 0 0 0 0 0  Number falls in past yr: - - - - -  Injury with Fall? - - - - -     Objective:  Clearance CootsJudith M Luca seemed alert and oriented and she participated appropriately during our telephone visit.  Blood Pressure Weight BMI  BP Readings from Last 3  Encounters:  12/30/20 124/71  05/22/20 (!) 139/58  05/02/20 119/61   Wt Readings from Last 3 Encounters:  12/30/20 118 lb 6.4 oz (  53.7 kg)  05/22/20 116 lb (52.6 kg)  05/02/20 114 lb 9.6 oz (52 kg)   BMI Readings from Last 1 Encounters:  12/30/20 22.37 kg/m    *Unable to obtain current vital signs, weight, and BMI due to telephone visit type  Hearing/Vision  . Lotus did  seem to have difficulty with hearing/understanding during the telephone conversation . Reports that she has not had a formal eye exam by an eye care professional within the past year . Reports that she has not had a formal hearing evaluation within the past year-Last visit was 10/2019 *Unable to fully assess hearing and vision during telephone visit type  Cognitive Function: 6CIT Screen 01/06/2021 09/04/2019  What Year? 0 points 0 points  What month? 0 points 0 points  What time? 0 points 0 points  Count back from 20 0 points 0 points  Months in reverse 0 points 0 points  Repeat phrase 0 points 0 points  Total Score 0 0   (Normal:0-7, Significant for Dysfunction: >8)  Normal Cognitive Function Screening: Yes   Immunization & Health Maintenance Record Immunization History  Administered Date(s) Administered  . Td 04/19/2006  . Tdap 12/30/2020  . Zoster 12/24/2009    Health Maintenance  Topic Date Due  . INFLUENZA VACCINE  01/23/2021 (Originally 05/26/2020)  . Hepatitis C Screening  05/02/2021 (Originally [birth year])  . PNA vac Low Risk Adult (1 of 2 - PCV13) 09/28/2021 (Originally 07/09/2006)  . MAMMOGRAM  01/14/2021  . DEXA SCAN  07/22/2022  . TETANUS/TDAP  12/31/2030  . HPV VACCINES  Aged Out  . COVID-19 Vaccine  Discontinued       Assessment  This is a routine wellness examination for EARTHA VONBEHREN.  Health Maintenance: Due or Overdue There are no preventive care reminders to display for this patient.  [name] does not need a referral for Community Assistance: Care  Management:   no Social Work:    no Prescription Assistance:  no Nutrition/Diabetes Education:  no   Plan:  Contractor. Goals    . AWV     01/06/2021 AWV Goal: Fall Prevention  . Over the next year, patient will decrease their risk for falls by: o Using assistive devices, such as a cane or walker, as needed o Identifying fall risks within their home and correcting them by: - Removing throw rugs - Adding handrails to stairs or ramps - Removing clutter and keeping a clear pathway throughout the home - Increasing light, especially at night - Adding shower handles/bars - Raising toilet seat o Identifying potential personal risk factors for falls: - Medication side effects - Incontinence/urgency - Vestibular dysfunction - Hearing loss - Musculoskeletal disorders - Neurological disorders - Orthostatic hypotension      . DIET - INCREASE WATER INTAKE     Try to drink 6-8 glasses of water daily    . Exercise 3x per week (30 min per time)     Walking is a great option.       Personalized Health Maintenance & Screening Recommendations  Pneumococcal vaccine  Hepatitis C Screening  Lung Cancer Screening Recommended: no (Low Dose CT Chest recommended if Age 34-80 years, 30 pack-year currently smoking OR have quit w/in past 15 years) Hepatitis C Screening recommended: no HIV Screening recommended: no  Advanced Directives: Written information was not prepared per patient's request.  Referrals & Orders No orders of the defined types were placed in this encounter.   Follow-up Plan . Follow-up with Gwenlyn Fudge,  FNP as planned . Schedule your yearly eye exam . AVS printed and mailed to patient    I have personally reviewed and noted the following in the patient's chart:   . Medical and social history . Use of alcohol, tobacco or illicit drugs  . Current medications and supplements . Functional ability and status . Nutritional status . Physical  activity . Advanced directives . List of other physicians . Hospitalizations, surgeries, and ER visits in previous 12 months . Vitals . Screenings to include cognitive, depression, and falls . Referrals and appointments  In addition, I have reviewed and discussed with [name] certain preventive protocols, quality metrics, and best practice recommendations. A written personalized care plan for preventive services as well as general preventive health recommendations is available and can be mailed to the patient at her request.      Billee Cashing, LPN  [id number]

## 2021-01-22 ENCOUNTER — Telehealth: Payer: Self-pay

## 2021-01-22 NOTE — Telephone Encounter (Signed)
Pt checking on her order for Prolia. Wants to be called when its here.

## 2021-01-23 NOTE — Telephone Encounter (Signed)
Called and let patient know that the nurse will be calling to schedule her shot

## 2021-01-24 ENCOUNTER — Ambulatory Visit (HOSPITAL_COMMUNITY): Payer: Medicare Other

## 2021-01-28 NOTE — Telephone Encounter (Signed)
Prolia is here in [facility], [facility]. Left message for patient to call and schedule appointment.

## 2021-01-29 NOTE — Telephone Encounter (Signed)
Patient scheduled for 01/30/21

## 2021-01-30 ENCOUNTER — Ambulatory Visit: Payer: Medicare Other

## 2021-01-31 ENCOUNTER — Ambulatory Visit: Admission: RE | Admit: 2021-01-31 | Payer: Medicare Other | Source: Ambulatory Visit

## 2021-02-04 ENCOUNTER — Ambulatory Visit (INDEPENDENT_AMBULATORY_CARE_PROVIDER_SITE_OTHER): Payer: Medicare Other | Admitting: *Deleted

## 2021-02-04 DIAGNOSIS — M810 Age-related osteoporosis without current pathological fracture: Secondary | ICD-10-CM | POA: Diagnosis not present

## 2021-02-04 MED ORDER — DENOSUMAB 60 MG/ML ~~LOC~~ SOSY
60.0000 mg | PREFILLED_SYRINGE | Freq: Once | SUBCUTANEOUS | Status: AC
Start: 2021-02-04 — End: 2021-02-04
  Administered 2021-02-04: 60 mg via SUBCUTANEOUS

## 2021-02-04 NOTE — Progress Notes (Signed)
Pt tolerated Prolia well

## 2021-02-27 ENCOUNTER — Ambulatory Visit (HOSPITAL_COMMUNITY)
Admission: RE | Admit: 2021-02-27 | Discharge: 2021-02-27 | Disposition: A | Discharge status: Home / Self Care | Payer: Medicare Other | Source: Ambulatory Visit | Attending: Family Medicine | Admitting: Family Medicine

## 2021-02-27 DIAGNOSIS — R222 Localized swelling, mass and lump, trunk: Secondary | ICD-10-CM | POA: Diagnosis present

## 2021-02-27 DIAGNOSIS — R1031 Right lower quadrant pain: Secondary | ICD-10-CM | POA: Insufficient documentation

## 2021-02-27 LAB — POCT I-STAT CREATININE: Creatinine, Ser: 0.7 mg/dL (ref 0.44–1.00)

## 2021-02-27 MED ORDER — IOHEXOL 300 MG/ML  SOLN
100.0000 mL | Freq: Once | INTRAMUSCULAR | Status: AC | PRN
  Administered 2021-02-27: 100 mL via INTRAVENOUS

## 2021-03-18 ENCOUNTER — Ambulatory Visit (INDEPENDENT_AMBULATORY_CARE_PROVIDER_SITE_OTHER): Payer: Medicare Other | Admitting: Family Medicine

## 2021-03-18 ENCOUNTER — Encounter: Payer: Self-pay | Admitting: Family Medicine

## 2021-03-18 ENCOUNTER — Other Ambulatory Visit: Payer: Self-pay

## 2021-03-18 VITALS — BP 118/67 | HR 77 | Temp 97.2°F | Ht 61.0 in | Wt 117.0 lb

## 2021-03-18 DIAGNOSIS — R222 Localized swelling, mass and lump, trunk: Secondary | ICD-10-CM | POA: Diagnosis not present

## 2021-03-18 NOTE — Progress Notes (Signed)
Assessment & Plan:  1. Abdominal wall mass - US Abdomen Limited; Future   Follow up plan: Return if symptoms worsen or fail to improve.  Deliah Boston, MSN, APRN, FNP-C [facility]  Subjective:   Patient ID: Dorothy Pitts, female    DOB: July 24, 1941, 80 y.o.   MRN: [id number]  HPI: Dorothy Pitts is a 80 y.o. female presenting on 03/18/2021 for discuss CT scan   Patient is here to discuss her recent CT scan.  This was ordered because she felt like she had a hernia in her right lower abdomen.  Previously an ultrasound showed a nodular density in this area; the CT scan did not show a hernia.  She had requested a repeat CT scan which still does not show a hernia.  She is now requesting a repeat ultrasound.   ROS: Negative unless specifically indicated above in HPI.   Relevant past medical history reviewed and updated as indicated.   Allergies and medications reviewed and updated.   Current Outpatient Medications:  .  Ascorbic Acid (VITAMIN C) 1000 MG tablet, Take 1,000 mg by mouth daily. Reported on 11/21/2015, Disp: , Rfl:  .  Calcium Carbonate-Vitamin D (CALTRATE 600+D) 600-400 MG-UNIT tablet, Take 1 tablet by mouth daily., Disp: 100 tablet, Rfl:  .  Cholecalciferol (VITAMIN D3) 5000 units CAPS, Take 1 capsule by mouth every Monday, Wednesday, and Friday., Disp: , Rfl:  .  denosumab (PROLIA) 60 MG/ML SOSY injection, Inject 60 mg into the skin every 6 (six) months., Disp: , Rfl:  .  enalapril (VASOTEC) 5 MG tablet, Take 1 tablet (5 mg total) by mouth daily., Disp: 90 tablet, Rfl: 1 .  famotidine-calcium carbonate-magnesium hydroxide (PEPCID COMPLETE) 10-800-165 MG chewable tablet, Chew 1 tablet by mouth daily., Disp: , Rfl:  .  loratadine (CLARITIN) 10 MG tablet, Take 10 mg by mouth daily., Disp: , Rfl:  .  nystatin cream (MYCOSTATIN), Apply 1 application topically 2 (two) times daily., Disp: , Rfl:  .  Polyethylene Glycol 3350 (MIRALAX PO), Take 1 Dose  by mouth daily., Disp: , Rfl:  .  Probiotic Product (ALIGN PO), Take 1 tablet by mouth daily., Disp: , Rfl:  .  rosuvastatin (CRESTOR) 5 MG tablet, Take 1 tablet (5 mg total) by mouth daily., Disp: 30 tablet, Rfl: 2 .  spironolactone (ALDACTONE) 50 MG tablet, Take 1 tablet (50 mg total) by mouth daily., Disp: 90 tablet, Rfl: 1 .  triamcinolone cream (KENALOG) 0.1 %, APPLY TO AFFECTED AREA TWICE A DAY, Disp: 15 g, Rfl: 3 .  vitamin B-12 (CYANOCOBALAMIN) 1000 MCG tablet, Take 1,000 mcg by mouth daily., Disp: , Rfl:  .  zolpidem (AMBIEN) 5 MG tablet, Take 1 tablet (5 mg total) by mouth at bedtime., Disp: 30 tablet, Rfl: 4  Allergies  Allergen Reactions  . Actonel [Risedronate Sodium] Other (See Comments)    Reaction=Heart burn Atelvia - caused diarrhea and vomiting  . Fosamax [Alendronate Sodium] Other (See Comments)    Reaction=Heart burn  . Crestor [Rosuvastatin Calcium] Other (See Comments)    Severe myalgias   . Nsaids Swelling    Reaction=Swelling of face  . Other     General anesthesia-nausea  . Augmentin [Amoxicillin-Pot Clavulanate]     Pt thinks she had nausea with this med when asked 04/27/17  . Cephalexin Itching and Rash  . Ciprofloxacin Itching and Rash  . Lovaza [Omega-3-Acid Ethyl Esters] Other (See Comments)    Reaction=skin bruise     Objective:  BP 118/67   Pulse 77   Temp (!) 97.2 F (36.2 C) (Temporal)   Ht 5\' 1"  (1.549 m)   Wt 117 lb (53.1 kg)   SpO2 100%   BMI 22.11 kg/m    Physical Exam Vitals reviewed.  Constitutional:      General: She is not in acute distress.    Appearance: Normal appearance. She is not ill-appearing, toxic-appearing or diaphoretic.  HENT:     Head: Normocephalic and atraumatic.  Eyes:     General: No scleral icterus.       Right eye: No discharge.        Left eye: No discharge.     Conjunctiva/sclera: Conjunctivae normal.  Cardiovascular:     Rate and Rhythm: Normal rate.  Pulmonary:     Effort: Pulmonary effort is  normal. No respiratory distress.  Abdominal:     Palpations: There is mass (size of a gumball in the right lower quadrant).  Musculoskeletal:        General: Normal range of motion.     Cervical back: Normal range of motion.  Skin:    General: Skin is warm and dry.     Capillary Refill: Capillary refill takes less than 2 seconds.  Neurological:     General: No focal deficit present.     Mental Status: She is alert and oriented to person, place, and time. Mental status is at baseline.  Psychiatric:        Mood and Affect: Mood normal.        Behavior: Behavior normal.        Thought Content: Thought content normal.        Judgment: Judgment normal.

## 2021-03-20 ENCOUNTER — Telehealth: Payer: Self-pay | Admitting: Family Medicine

## 2021-03-28 ENCOUNTER — Ambulatory Visit (HOSPITAL_COMMUNITY): Payer: Medicare Other

## 2021-04-21 ENCOUNTER — Ambulatory Visit (HOSPITAL_COMMUNITY)
Admission: RE | Admit: 2021-04-21 | Discharge: 2021-04-21 | Disposition: A | Discharge status: Home / Self Care | Payer: Medicare Other | Source: Ambulatory Visit | Attending: Family Medicine | Admitting: Family Medicine

## 2021-04-21 ENCOUNTER — Other Ambulatory Visit: Payer: Self-pay

## 2021-04-21 ENCOUNTER — Telehealth: Payer: Self-pay | Admitting: Family Medicine

## 2021-04-21 DIAGNOSIS — R222 Localized swelling, mass and lump, trunk: Secondary | ICD-10-CM | POA: Insufficient documentation

## 2021-04-21 NOTE — Telephone Encounter (Signed)
We scanned the area she has been having pain and feels something. If they want a further work-up I think she may need to see a specialist. I can refer to Dr. Henreitta Leber, general surgery, to discuss as she would be the one to remove if it was a hernia as patient suspects.

## 2021-04-21 NOTE — Progress Notes (Signed)
Pt called and aware - she is concerned that this says no hernia - BUT, she has a note from Dr Ezzard Standing that states otherwise - she wants to bring this by this week for review.

## 2021-04-22 NOTE — Telephone Encounter (Signed)
Patient aware and verbalizes understanding. 

## 2021-04-24 ENCOUNTER — Other Ambulatory Visit: Payer: Self-pay | Admitting: Family Medicine

## 2021-04-24 DIAGNOSIS — K439 Ventral hernia without obstruction or gangrene: Secondary | ICD-10-CM

## 2021-05-28 ENCOUNTER — Other Ambulatory Visit: Payer: Self-pay | Admitting: Family Medicine

## 2021-05-28 DIAGNOSIS — I10 Essential (primary) hypertension: Secondary | ICD-10-CM

## 2021-06-19 ENCOUNTER — Other Ambulatory Visit: Payer: Self-pay | Admitting: Family Medicine

## 2021-06-19 DIAGNOSIS — I10 Essential (primary) hypertension: Secondary | ICD-10-CM

## 2021-06-24 ENCOUNTER — Ambulatory Visit (INDEPENDENT_AMBULATORY_CARE_PROVIDER_SITE_OTHER): Payer: Medicare Other | Admitting: Family Medicine

## 2021-06-24 ENCOUNTER — Other Ambulatory Visit: Payer: Self-pay

## 2021-06-24 ENCOUNTER — Encounter: Payer: Self-pay | Admitting: Family Medicine

## 2021-06-24 VITALS — BP 132/57 | HR 64 | Temp 96.1°F | Ht 61.0 in | Wt 117.8 lb

## 2021-06-24 DIAGNOSIS — E782 Mixed hyperlipidemia: Secondary | ICD-10-CM | POA: Diagnosis not present

## 2021-06-24 DIAGNOSIS — I700 Atherosclerosis of aorta: Secondary | ICD-10-CM

## 2021-06-24 DIAGNOSIS — I10 Essential (primary) hypertension: Secondary | ICD-10-CM

## 2021-06-24 DIAGNOSIS — M810 Age-related osteoporosis without current pathological fracture: Secondary | ICD-10-CM

## 2021-06-24 DIAGNOSIS — E559 Vitamin D deficiency, unspecified: Secondary | ICD-10-CM

## 2021-06-24 DIAGNOSIS — Z87898 Personal history of other specified conditions: Secondary | ICD-10-CM | POA: Insufficient documentation

## 2021-06-24 DIAGNOSIS — K581 Irritable bowel syndrome with constipation: Secondary | ICD-10-CM | POA: Diagnosis not present

## 2021-06-24 DIAGNOSIS — R238 Other skin changes: Secondary | ICD-10-CM

## 2021-06-24 DIAGNOSIS — S61213A Laceration without foreign body of left middle finger without damage to nail, initial encounter: Secondary | ICD-10-CM

## 2021-06-24 DIAGNOSIS — K439 Ventral hernia without obstruction or gangrene: Secondary | ICD-10-CM

## 2021-06-24 LAB — BAYER DCA HB A1C WAIVED: HB A1C (BAYER DCA - WAIVED): 5.7 % (ref <7.0)

## 2021-06-24 MED ORDER — ROSUVASTATIN CALCIUM 5 MG PO TABS: 5.0000 mg | ORAL_TABLET | ORAL | 3 refills | Status: DC

## 2021-06-24 MED ORDER — ENALAPRIL MALEATE 5 MG PO TABS: 5.0000 mg | ORAL_TABLET | Freq: Every day | ORAL | 3 refills | Status: DC

## 2021-06-24 NOTE — Progress Notes (Signed)
Assessment & Plan:  1. Essential hypertension Well controlled on current regimen.  - enalapril (VASOTEC) 5 MG tablet; Take 1 tablet (5 mg total) by mouth daily.  Dispense: 90 tablet; Refill: 3 - CBC with Differential/Platelet - CMP14+EGFR - Lipid panel  2. Mixed hyperlipidemia Encouraged to take rosuvastatin 3x/week as previously prescribed. Discussed if she develops pain we can back off and they will resolve. She is agreeable as she was not previously aware the pain would resolve if she backed off on the medication.  - rosuvastatin (CRESTOR) 5 MG tablet; Take 1 tablet (5 mg total) by mouth 3 (three) times a week.  Dispense: 36 tablet; Refill: 3 - CMP14+EGFR - Lipid panel  3. Thoracic aortic atherosclerosis ([place]) Encouraged to take statin as above. Patient to resume ASA 81 mg once daily. - rosuvastatin (CRESTOR) 5 MG tablet; Take 1 tablet (5 mg total) by mouth 3 (three) times a week.  Dispense: 36 tablet; Refill: 3 - CMP14+EGFR - Lipid panel  4. Irritable bowel syndrome with constipation Well controlled on current regimen.   5. Age-related osteoporosis without current pathological fracture Well controlled on current regimen.   6. Vitamin D deficiency Labs to assess. Well controlled on current regimen.  - VITAMIN D 25 Hydroxy (Vit-D Deficiency, Fractures)  7. History of prediabetes Labs to assess.  - Bayer DCA Hb A1c Waived  8. Laceration of left middle finger without foreign body without damage to nail, initial encounter Skin tear cleansed with normal saline. Steri-strips and telfa dressing applied and secured with a tegaderm.  9. 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  10. Hernia of abdominal wall Phone # provided for referral.    Return in about 6 months (around 12/23/2021) for follow-up of chronic medication conditions.  Hendricks Limes, MSN, APRN, FNP-C [facility]  Subjective:    Patient ID: TOKIKO DIEFENDERFER, female     DOB: 11/05/1940, 80 y.o.   MRN: [id number]  Patient Care Team: Loman Brooklyn, FNP as PCP - General (Family Medicine) Pyrtle, Lajuan Lines, MD as Consulting Physician (Gastroenterology) Alphonsa Overall, MD as Consulting Physician (General Surgery) Vania Rea, MD as Consulting Physician (Obstetrics and Gynecology)   Chief Complaint:  Chief Complaint  Patient presents with  . Hyperlipidemia  . Hypertension    Check up of chronic medical conditions   . Laceration    Left middle finger. Patient hit her hand on something in her pantry yesterday     HPI: GIUSEPPINA QUINONES is a 80 y.o. female presenting on 06/24/2021 for Hyperlipidemia, Hypertension (Check up of chronic medical conditions ), and Laceration (Left middle finger. Patient hit her hand on something in her pantry yesterday )  Hypertension: takes medication as prescribed.    Thoracic Aortic Atherosclerosis/Hyperlipidemia: takes statin three times weekly sometimes. She is worried if she takes it consistently she will develop the joint/muscle pain that people tell her about.    IBS: controlled with Miralax daily and Colace as needed.   Osteoporosis: patient gets Prolia injections every 6 months. Last injection 02/04/2021. Last DEXA 07/22/2020. Taking calcium and vitamin D daily.    Vitamin D Deficiency: taking vitamin D supplement. Last vitamin D level normal on 12/30/2020. She has been normal for several years now.    Pre-diabetes: last A1c 5.6 on 05/02/2020.   New complaints: Patient reports she sustained a skin tear last night on her left middle finger. She cleaned it with alcohol and applied antibiotic ointment before covering with a Band-Aid. She  is concerned about her thin skin and what she can do about it.  Patient was referred to a general surgeon for a hernia and has not yet heard from them.   Social history:  Relevant past medical, surgical, family and social history reviewed and updated as indicated. Interim medical history  since our last visit reviewed.  Allergies and medications reviewed and updated.  DATA REVIEWED: CHART IN EPIC  ROS: Negative unless specifically indicated above in HPI.    Current Outpatient Medications:  .  Ascorbic Acid (VITAMIN C) 1000 MG tablet, Take 1,000 mg by mouth daily. Reported on 11/21/2015, Disp: , Rfl:  .  aspirin EC 81 MG tablet, Take 81 mg by mouth daily. Swallow whole., Disp: , Rfl:  .  Calcium Carbonate-Vitamin D (CALTRATE 600+D) 600-400 MG-UNIT tablet, Take 1 tablet by mouth daily., Disp: 100 tablet, Rfl:  .  Cholecalciferol (VITAMIN D3) 5000 units CAPS, Take 1 capsule by mouth every Monday, Wednesday, and Friday., Disp: , Rfl:  .  denosumab (PROLIA) 60 MG/ML SOSY injection, Inject 60 mg into the skin every 6 (six) months., Disp: , Rfl:  .  famotidine-calcium carbonate-magnesium hydroxide (PEPCID COMPLETE) 10-800-165 MG chewable tablet, Chew 1 tablet by mouth daily., Disp: , Rfl:  .  loratadine (CLARITIN) 10 MG tablet, Take 10 mg by mouth daily., Disp: , Rfl:  .  nystatin cream (MYCOSTATIN), Apply 1 application topically 2 (two) times daily., Disp: , Rfl:  .  Polyethylene Glycol 3350 (MIRALAX PO), Take 1 Dose by mouth daily., Disp: , Rfl:  .  Probiotic Product (ALIGN PO), Take 1 tablet by mouth daily., Disp: , Rfl:  .  spironolactone (ALDACTONE) 50 MG tablet, Take 1 tablet (50 mg total) by mouth daily., Disp: 30 tablet, Rfl: 0 .  triamcinolone cream (KENALOG) 0.1 %, APPLY TO AFFECTED AREA TWICE A DAY, Disp: 15 g, Rfl: 3 .  vitamin B-12 (CYANOCOBALAMIN) 1000 MCG tablet, Take 1,000 mcg by mouth daily., Disp: , Rfl:  .  enalapril (VASOTEC) 5 MG tablet, Take 1 tablet (5 mg total) by mouth daily., Disp: 90 tablet, Rfl: 3 .  [START ON 06/25/2021] rosuvastatin (CRESTOR) 5 MG tablet, Take 1 tablet (5 mg total) by mouth 3 (three) times a week., Disp: 36 tablet, Rfl: 3   Allergies  Allergen Reactions  . Actonel [Risedronate Sodium] Other (See Comments)    Reaction=Heart  burn Atelvia - caused diarrhea and vomiting  . Docosahexaenoic Acid (Dha) Other (See Comments)    Bruising  . Fosamax [Alendronate Sodium] Other (See Comments)    Reaction=Heart burn  . Crestor [Rosuvastatin Calcium] Other (See Comments)    Severe myalgias   . Nsaids Swelling    Reaction=Swelling of face  . Other     General anesthesia-nausea  . Augmentin [Amoxicillin-Pot Clavulanate]     Pt thinks she had nausea with this med when asked 04/27/17  . Cephalexin Itching and Rash  . Cephalosporins Rash  . Ciprofloxacin Itching and Rash  . Lovaza [Omega-3-Acid Ethyl Esters] Other (See Comments)    Reaction=skin bruise    Past Medical History:  Diagnosis Date  . Acute diverticulitis 10/07/2013   With microperforation.  . Anemia 10/08/2013  . Bowel obstruction ([place])   . Cataract   . Diverticulosis of colon (without mention of hemorrhage)   . GERD (gastroesophageal reflux disease)   . Hemorrhoids   . HOH (hard of hearing)   . Hyperlipidemia   . Hypertension   . IBS (irritable bowel syndrome)   . Osteoporosis   .  PONV (postoperative nausea and vomiting)   . Thoracic aortic atherosclerosis ([place])   . UTI (lower urinary tract infection) 10/07/2013  . Vitamin D deficiency     Past Surgical History:  Procedure Laterality Date  . ABDOMINAL HYSTERECTOMY    . APPENDECTOMY    . BILATERAL OOPHORECTOMY    . BREAST LUMPECTOMY Left   . CATARACT EXTRACTION    . COLON RESECTION N/A 12/01/2013   Procedure: HAND ASSISTED LAPAROSCOPIC PARTIAL COLECTOMY;  Surgeon: Jamesetta So, MD;  Location: [facility];  Service: General;  Laterality: N/A;  . EYE SURGERY    . HEMORRHOID SURGERY    . HERNIA REPAIR  09/04/2016   LAPAROSCOPIC VENTRAL INCISIONAL HERNIA REPAIR POSSIBLE OPEN (N/A)  . INCISIONAL HERNIA REPAIR N/A 09/04/2016   Procedure: LAPAROSCOPIC VENTRAL INCISIONAL HERNIA REPAIR POSSIBLE OPEN;  Surgeon: Alphonsa Overall, MD;  Location: [facility];  Service: General;  Laterality: N/A;  . INSERTION OF MESH  N/A 09/04/2016   Procedure: INSERTION OF MESH;  Surgeon: Alphonsa Overall, MD;  Location: [facility];  Service: General;  Laterality: N/A;  . PARTIAL HYSTERECTOMY    . TONSILLECTOMY      Social History   Socioeconomic History  . Marital status: Widowed    Spouse name: Not on file  . Number of children: 3  . Years of education: 60  . Highest education level: Some college, no degree  Occupational History  . Occupation: retired    Fish farm manager: RETIRED  Tobacco Use  . Smoking status: Former    Packs/day: 1.00    Years: 35.00    Pack years: 35.00    Types: Cigarettes    Quit date: 10/26/2002    Years since quitting: 18.6  . Smokeless tobacco: Never  Vaping Use  . Vaping Use: Never used  Substance and Sexual Activity  . Alcohol use: Yes    Alcohol/week: 4.0 standard drinks    Types: 4 Glasses of wine per week    Comment: wine  . Drug use: No  . Sexual activity: Not Currently  Other Topics Concern  . Not on file  Social History Narrative  . Not on file   Social Determinants of Health   Financial Resource Strain: Not on file  Food Insecurity: Not on file  Transportation Needs: Not on file  Physical Activity: Not on file  Stress: Not on file  Social Connections: Not on file  Intimate Partner Violence: Not on file        Objective:    BP (!) 132/57   Pulse 64   Temp (!) 96.1 F (35.6 C) (Temporal)   Ht 5' 1"  (1.549 m)   Wt 117 lb 12.8 oz (53.4 kg)   SpO2 100%   BMI 22.26 kg/m   Wt Readings from Last 3 Encounters:  06/24/21 117 lb 12.8 oz (53.4 kg)  03/18/21 117 lb (53.1 kg)  12/30/20 118 lb 6.4 oz (53.7 kg)    Physical Exam Vitals reviewed.  Constitutional:      General: She is not in acute distress.    Appearance: Normal appearance. She is normal weight. She is not ill-appearing, toxic-appearing or diaphoretic.  HENT:     Head: Normocephalic and atraumatic.     Right Ear: Tympanic membrane, ear canal and external ear normal. There is no impacted cerumen.     Left  Ear: Tympanic membrane, ear canal and external ear normal. There is no impacted cerumen.     Nose: Nose normal. No congestion or rhinorrhea.  Mouth/Throat:     Mouth: Mucous membranes are moist.     Pharynx: Oropharynx is clear. No oropharyngeal exudate or posterior oropharyngeal erythema.  Eyes:     General: No scleral icterus.       Right eye: No discharge.        Left eye: No discharge.     Conjunctiva/sclera: Conjunctivae normal.     Pupils: Pupils are equal, round, and reactive to light.  Cardiovascular:     Rate and Rhythm: Normal rate and regular rhythm.     Heart sounds: Normal heart sounds. No murmur heard.   No friction rub. No gallop.  Pulmonary:     Effort: Pulmonary effort is normal. No respiratory distress.     Breath sounds: Normal breath sounds. No stridor. No wheezing, rhonchi or rales.  Abdominal:     General: Abdomen is flat. Bowel sounds are normal. There is no distension.     Palpations: Abdomen is soft. There is no hepatomegaly, splenomegaly or mass.     Tenderness: There is no abdominal tenderness. There is no guarding or rebound.     Hernia: No hernia is present.  Musculoskeletal:        General: Normal range of motion.     Cervical back: Normal range of motion and neck supple. No rigidity. No muscular tenderness.  Lymphadenopathy:     Cervical: No cervical adenopathy.  Skin:    General: Skin is warm and dry.     Capillary Refill: Capillary refill takes less than 2 seconds.     Findings: Laceration (skin tear to left middle finger without drainage, erythema, warmth, or odor.) present.  Neurological:     General: No focal deficit present.     Mental Status: She is alert and oriented to person, place, and time. Mental status is at baseline.  Psychiatric:        Mood and Affect: Mood normal.        Behavior: Behavior normal.        Thought Content: Thought content normal.        Judgment: Judgment normal.   Laceration repair  Date/Time: 06/24/2021  2:33 PM Performed by: Loman Brooklyn, FNP Authorized by: Loman Brooklyn, FNP   Consent:    Consent obtained:  Verbal   Consent given by:  Patient   Risks, benefits, and alternatives were discussed: yes     Risks discussed:  Infection and pain Anesthesia:    Anesthesia method:  None Laceration details:    Location:  Finger   Finger location:  L long finger Pre-procedure details:    Preparation:  Patient was prepped and draped in usual sterile fashion Treatment:    Area cleansed with:  Saline   Amount of cleaning:  Standard   Irrigation solution:  Sterile saline Skin repair:    Repair method:  Steri-Strips   Number of Steri-Strips:  5 Approximation:    Approximation:  Close Repair type:    Repair type:  Simple Post-procedure details:    Dressing:  Non-adherent dressing and sterile dressing   Procedure completion:  Tolerated well, no immediate complications    Lab Results  Component Value Date   TSH 1.190 11/03/2019   Lab Results  Component Value Date   WBC 5.4 12/23/2020   HGB 14.1 12/23/2020   HCT 42.1 12/23/2020   MCV 95 12/23/2020   PLT 254 12/23/2020   Lab Results  Component Value Date   NA 139 12/23/2020   K 4.4 12/23/2020  [name] 23 12/23/2020   GLUCOSE 109 (H) 12/23/2020   BUN 19 12/23/2020   CREATININE 0.70 02/27/2021   BILITOT 0.6 12/23/2020   ALKPHOS 43 (L) 12/23/2020   AST 21 12/23/2020   ALT 17 12/23/2020   PROT 6.8 12/23/2020   ALBUMIN 4.7 12/23/2020   CALCIUM 10.8 (H) 12/23/2020   ANIONGAP 8 09/12/2016   EGFR 77 12/23/2020   Lab Results  Component Value Date   CHOL 290 (H) 12/23/2020   Lab Results  Component Value Date   HDL 63 12/23/2020   Lab Results  Component Value Date   LDLCALC 204 (H) 12/23/2020   Lab Results  Component Value Date   TRIG 130 12/23/2020   Lab Results  Component Value Date   CHOLHDL 4.6 (H) 12/23/2020   Lab Results  Component Value Date   HGBA1C 5.6 05/02/2020

## 2021-06-24 NOTE — Patient Instructions (Addendum)
Dr. Alberteen Spindle [phone]) [phone]  DerMend for fragile skin.  Start taking a baby aspirin 81 mg once daily.

## 2021-06-25 LAB — CBC WITH DIFFERENTIAL/PLATELET
Basophils Absolute: 0.1 10*3/uL (ref 0.0–0.2)
Basos: 1 %
EOS (ABSOLUTE): 0.3 10*3/uL (ref 0.0–0.4)
Eos: 5 %
Hematocrit: 37.4 % (ref 34.0–46.6)
Hemoglobin: 12.4 g/dL (ref 11.1–15.9)
Immature Grans (Abs): 0 10*3/uL (ref 0.0–0.1)
Immature Granulocytes: 0 %
Lymphocytes Absolute: 2 10*3/uL (ref 0.7–3.1)
Lymphs: 36 %
MCH: 31.4 pg (ref 26.6–33.0)
MCHC: 33.2 g/dL (ref 31.5–35.7)
MCV: 95 fL (ref 79–97)
Monocytes Absolute: 0.5 10*3/uL (ref 0.1–0.9)
Monocytes: 9 %
Neutrophils Absolute: 2.8 10*3/uL (ref 1.4–7.0)
Neutrophils: 49 %
Platelets: 278 10*3/uL (ref 150–450)
RBC: 3.95 x10E6/uL (ref 3.77–5.28)
RDW: 11.9 % (ref 11.7–15.4)
WBC: 5.7 10*3/uL (ref 3.4–10.8)

## 2021-06-25 LAB — LIPID PANEL
Chol/HDL Ratio: 3.7 ratio (ref 0.0–4.4)
Cholesterol, Total: 229 mg/dL — ABNORMAL HIGH (ref 100–199)
HDL: 62 mg/dL (ref >39)
LDL Chol Calc (NIH): 136 mg/dL — ABNORMAL HIGH (ref 0–99)
Triglycerides: 174 mg/dL — ABNORMAL HIGH (ref 0–149)
VLDL Cholesterol Cal: 31 mg/dL (ref 5–40)

## 2021-06-25 LAB — VITAMIN D 25 HYDROXY (VIT D DEFICIENCY, FRACTURES): Vit D, 25-Hydroxy: 48.6 ng/mL (ref 30.0–100.0)

## 2021-06-25 LAB — CMP14+EGFR
ALT: 11 IU/L (ref 0–32)
AST: 16 IU/L (ref 0–40)
Albumin/Globulin Ratio: 2.4 — ABNORMAL HIGH (ref 1.2–2.2)
Albumin: 4.4 g/dL (ref 3.7–4.7)
Alkaline Phosphatase: 41 IU/L — ABNORMAL LOW (ref 44–121)
BUN/Creatinine Ratio: 27 (ref 12–28)
BUN: 18 mg/dL (ref 8–27)
Bilirubin Total: <0.2 mg/dL (ref 0.0–1.2)
CO2: 27 mmol/L (ref 20–29)
Calcium: 9.7 mg/dL (ref 8.7–10.3)
Chloride: 100 mmol/L (ref 96–106)
Creatinine, Ser: 0.67 mg/dL (ref 0.57–1.00)
Globulin, Total: 1.8 g/dL (ref 1.5–4.5)
Glucose: 93 mg/dL (ref 65–99)
Potassium: 4.8 mmol/L (ref 3.5–5.2)
Sodium: 137 mmol/L (ref 134–144)
Total Protein: 6.2 g/dL (ref 6.0–8.5)
eGFR: 89 mL/min/{1.73_m2} (ref >59)

## 2021-07-01 ENCOUNTER — Telehealth: Payer: Self-pay

## 2021-07-01 NOTE — Telephone Encounter (Signed)
Patient would like to know when she is due for her prolia? Thinks it is time.  Please advise and call patient

## 2021-07-26 ENCOUNTER — Other Ambulatory Visit: Payer: Self-pay | Admitting: Family Medicine

## 2021-07-26 DIAGNOSIS — I10 Essential (primary) hypertension: Secondary | ICD-10-CM

## 2021-07-28 DIAGNOSIS — R1031 Right lower quadrant pain: Secondary | ICD-10-CM

## 2021-07-28 DIAGNOSIS — G8929 Other chronic pain: Secondary | ICD-10-CM | POA: Diagnosis not present

## 2021-08-04 ENCOUNTER — Other Ambulatory Visit: Payer: Self-pay

## 2021-08-04 ENCOUNTER — Ambulatory Visit (INDEPENDENT_AMBULATORY_CARE_PROVIDER_SITE_OTHER): Payer: Medicare Other | Admitting: Nurse Practitioner

## 2021-08-04 ENCOUNTER — Encounter: Payer: Self-pay | Admitting: Nurse Practitioner

## 2021-08-04 VITALS — BP 124/70 | HR 82 | Temp 97.4°F | Ht 61.0 in | Wt 118.0 lb

## 2021-08-04 DIAGNOSIS — G8929 Other chronic pain: Secondary | ICD-10-CM

## 2021-08-04 DIAGNOSIS — R1031 Right lower quadrant pain: Secondary | ICD-10-CM

## 2021-08-04 MED ORDER — LIDOCAINE 5 % EX PTCH: 1.0000 | MEDICATED_PATCH | CUTANEOUS | 0 refills | Status: DC

## 2021-08-04 NOTE — Patient Instructions (Signed)
Adductor Muscle Strain An adductor muscle strain, also called a groin strain or pull, is an injury to the muscles or tendons on the upper, inner part of the thigh. These muscles are called the adductor muscles or groin muscles. They are responsible for moving the legs across the body or pulling the legs together. A muscle strain occurs when a muscle is overstretched and some muscle fibers are torn. An adductor muscle strain can range from mild to severe, depending on how many muscle fibers are affected and whether the muscle fibers are partially or completely torn. What are the causes? Adductor muscle strains usually occur during exercise or while participating in sports. The injury often happens when a sudden, violent force is placed on a muscle, stretching the muscle too far. A strain is more likely to happen when your muscles are not warmed up or if you are not properly conditioned. This injury may be caused by: Stretching the adductor muscles too far or too suddenly, often during side-to-side motion with a sudden change in direction. Putting repeated stress on the adductor muscles over a long period of time. Performing vigorous activity without properly stretching the adductor muscles beforehand. What are the signs or symptoms? Symptoms of this condition include: Pain and tenderness in the groin area. This begins as sharp pain and persists as a dull ache. A popping or snapping feeling when the injury occurs (for severe strains). Swelling or bruising. Muscle spasms. Weakness in the leg. Stiffness in the groin area with decreased ability to move the affected muscles. How is this diagnosed? This condition may be diagnosed based on: Your symptoms and a description of how the injury occurred. A physical exam. Imaging tests, such as: X-rays. These are sometimes needed to rule out a broken bone or cartilage problems. An ultrasound, CT scan, or MRI. These may be done if your health care provider  suspects a complete muscle tear or needs to check for other injuries. How is this treated? An adductor strain will often heal on its own. If needed, this condition may be treated with: PRICE therapy. PRICE stands for protection of the injured area, rest, ice, pressure (compression), and elevation. Medicines to help manage pain and swelling (anti-inflammatory medicines). Crutches. You may be directed to use these for the first few days to minimize your pain. Depending on the severity of the muscle strain, recovery time may vary from a few weeks to several months. Severe injuries often require 4-6 weeks for recovery. In those cases, complete healing can take 4-5 months. Follow these instructions at home: PRICE Therapy  Protect the muscle from being injured again. Rest. Do not use the strained muscle if it causes pain. If directed, put ice on the injured area: Put ice in a plastic bag. Place a towel between your skin and the bag. Leave the ice on for 20 minutes, 2-3 times a day. Do this for the first 2 days after the injury. Apply compression by wrapping the injured area with an elastic bandage as told by your health care provider. Raise (elevate) the injured area above the level of your heart while you are sitting or lying down. General instructions Take over-the-counter and prescription medicines only as told by your health care provider. Walk, stretch, and do exercises as told by your health care provider. Only do these activities if you can do so without any pain. Follow your treatment plan as told by your health care provider. This may include: Physical therapy. Massage. Local electrical stimulation (transcutaneous   electrical nerve stimulation, TENS). How is this prevented? Warm up and stretch before being active. Cool down and stretch after being active. Give your body time to rest between periods of activity. Make sure to use equipment that fits you. Be safe and responsible while  being active to avoid slips and falls. Maintain physical fitness, including: Proper conditioning in the adductor muscles. Overall strength, flexibility, and endurance. Contact a health care provider if: You have increased pain or swelling in the affected area. Your symptoms are not improving or they are getting worse. Summary An adductor muscle strain, also called a groin strain or pull, is an injury to the muscles or tendons on the upper, inner part of the thigh. A muscle strain occurs when a muscle is overstretched and some muscle fibers are torn. Depending on the severity of the muscle strain, recovery time may vary from a few weeks to several months. This information is not intended to replace advice given to you by your health care provider. Make sure you discuss any questions you have with your health care provider. Document Revised: 01/31/2019 Document Reviewed: 03/14/2018 Elsevier Patient Education  2022 [employer]  

## 2021-08-04 NOTE — Progress Notes (Signed)
Acute Office Visit  Subjective:    Patient ID: Dorothy Pitts, female    DOB: [birth year], 80 y.o.   MRN: [id number]  Chief Complaint  Patient presents with   Groin Pain    Groin Pain The patient's primary symptoms include pelvic pain. This is a recurrent problem. The current episode started 1 to 4 weeks ago. The problem has been gradually worsening. The pain is moderate. The problem affects the right side. Associated symptoms include frequency. Pertinent negatives include no abdominal pain, fever, flank pain or headaches. She has tried nothing for the symptoms. She uses nothing for contraception. She is postmenopausal.    Past Medical History:  Diagnosis Date   Acute diverticulitis 10/07/2013   With microperforation.   Anemia 10/08/2013   Bowel obstruction (HCC)    Cataract    Diverticulosis of colon (without mention of hemorrhage)    GERD (gastroesophageal reflux disease)    Hemorrhoids    HOH (hard of hearing)    Hyperlipidemia    Hypertension    IBS (irritable bowel syndrome)    Osteoporosis    PONV (postoperative nausea and vomiting)    Thoracic aortic atherosclerosis ([place])    UTI (lower urinary tract infection) 10/07/2013   Vitamin D deficiency     Past Surgical History:  Procedure Laterality Date   ABDOMINAL HYSTERECTOMY     APPENDECTOMY     BILATERAL OOPHORECTOMY     BREAST LUMPECTOMY Left    CATARACT EXTRACTION     COLON RESECTION N/A 12/01/2013   Procedure: HAND ASSISTED LAPAROSCOPIC PARTIAL COLECTOMY;  Surgeon: Jamesetta So, MD;  Location: [facility];  Service: General;  Laterality: N/A;   [place]  09/04/2016   LAPAROSCOPIC VENTRAL INCISIONAL HERNIA REPAIR POSSIBLE OPEN (N/A)   INCISIONAL HERNIA REPAIR N/A 09/04/2016   Procedure: LAPAROSCOPIC VENTRAL INCISIONAL HERNIA REPAIR POSSIBLE OPEN;  Surgeon: Alphonsa Overall, MD;  Location: [facility];  Service: General;  Laterality: N/A;   INSERTION OF MESH N/A 09/04/2016    Procedure: INSERTION OF MESH;  Surgeon: Alphonsa Overall, MD;  Location: [facility];  Service: General;  Laterality: N/A;   PARTIAL HYSTERECTOMY     TONSILLECTOMY      Family History  Problem Relation Age of Onset   Hypertension Mother    Heart attack Mother 21   Parkinson's disease Father    Heart disease Brother 78       bypass   Obesity Brother    Asthma Son    Other Son        Gout   Colon cancer Neg Hx     Social History   Socioeconomic History   Marital status: Widowed    Spouse name: Not on file   Number of children: 3   Years of education: 15   Highest education level: Some college, no degree  Occupational History   Occupation: retired    Fish farm manager: RETIRED  Tobacco Use   Smoking status: Former    Packs/day: 1.00    Years: 35.00    Pack years: 35.00    Types: Cigarettes    Quit date: 10/26/2002    Years since quitting: 18.7   Smokeless tobacco: Never  Vaping Use   Vaping Use: Never used  Substance and Sexual Activity   Alcohol use: Yes    Alcohol/week: 4.0 standard drinks    Types: 4 Glasses of wine per week    Comment: wine  Drug use: No   Sexual activity: Not Currently  Other Topics Concern   Not on file  Social History Narrative   Not on file   Social Determinants of Health   Financial Resource Strain: Not on file  Food Insecurity: Not on file  Transportation Needs: Not on file  Physical Activity: Not on file  Stress: Not on file  Social Connections: Not on file  Intimate Partner Violence: Not on file    Outpatient Medications Prior to Visit  Medication Sig Dispense Refill   Ascorbic Acid (VITAMIN C) 1000 MG tablet Take 1,000 mg by mouth daily. Reported on 11/21/2015     aspirin EC 81 MG tablet Take 81 mg by mouth daily. Swallow whole.     Calcium Carbonate-Vitamin D (CALTRATE 600+D) 600-400 MG-UNIT tablet Take 1 tablet by mouth daily. 100 tablet    Cholecalciferol (VITAMIN D3) 5000 units CAPS Take 1 capsule by mouth every Monday, Wednesday, and  Friday.     denosumab (PROLIA) 60 MG/ML SOSY injection Inject 60 mg into the skin every 6 (six) months.     enalapril (VASOTEC) 5 MG tablet Take 1 tablet (5 mg total) by mouth daily. 90 tablet 3   famotidine-calcium carbonate-magnesium hydroxide (PEPCID COMPLETE) 10-800-165 MG chewable tablet Chew 1 tablet by mouth daily.     loratadine (CLARITIN) 10 MG tablet Take 10 mg by mouth daily.     nystatin cream (MYCOSTATIN) Apply 1 application topically 2 (two) times daily.     Polyethylene Glycol 3350 (MIRALAX PO) Take 1 Dose by mouth daily.     Probiotic Product (ALIGN PO) Take 1 tablet by mouth daily.     rosuvastatin (CRESTOR) 5 MG tablet Take 1 tablet (5 mg total) by mouth 3 (three) times a week. 36 tablet 3   spironolactone (ALDACTONE) 50 MG tablet TAKE 1 TABLET BY MOUTH EVERY DAY 30 tablet 5   triamcinolone cream (KENALOG) 0.1 % APPLY TO AFFECTED AREA TWICE A DAY 15 g 3   vitamin B-12 (CYANOCOBALAMIN) 1000 MCG tablet Take 1,000 mcg by mouth daily.     No facility-administered medications prior to visit.    Allergies  Allergen Reactions   Actonel [Risedronate Sodium] Other (See Comments)    Reaction=Heart burn At[place]via - caused diarrhea and vomiting   Docosahexaenoic Acid (Dha) Other (See Comments)    Bruising   Fosamax [Alendronate Sodium] Other (See Comments)    Reaction=Heart burn   Crestor [Rosuvastatin Calcium] Other (See Comments)    Severe myalgias    Nsaids Swelling    Reaction=Swelling of face   Other     General anesthesia-nausea   Augmentin [Amoxicillin-Pot Clavulanate]     Pt thinks she had nausea with this med when asked 04/27/17   Cephalexin Itching and Rash   Cephalosporins Rash   Ciprofloxacin Itching and Rash   Lovaza [Omega-3-Acid Ethyl Esters] Other (See Comments)    Reaction=skin bruise     Review of Systems  Constitutional:  Negative for fever.  Gastrointestinal:  Negative for abdominal pain.  Genitourinary:  Positive for frequency and pelvic pain.  Negative for flank pain.  Neurological:  Negative for headaches.  All other systems reviewed and are negative.     Objective:    Physical Exam Vitals and nursing note reviewed.  Constitutional:      Appearance: Normal appearance. She is normal weight.  HENT:     Head: Normocephalic.     Nose: Nose normal.     Mouth/Throat:  Mouth: Mucous membranes are moist.     Pharynx: Oropharynx is clear.  Eyes:     Conjunctiva/sclera: Conjunctivae normal.  Cardiovascular:     Rate and Rhythm: Normal rate and regular rhythm.     Pulses: Normal pulses.     Heart sounds: Normal heart sounds.  Pulmonary:     Effort: Pulmonary effort is normal.  Abdominal:     General: Bowel sounds are normal.  Musculoskeletal:     Right upper leg: Tenderness present.       Legs:     Comments: Right groin pain  Skin:    General: Skin is warm.  Neurological:     Mental Status: She is alert and oriented to person, place, and time.    BP 124/70   Pulse 82   Temp (!) 97.4 F (36.3 C) (Temporal)   Ht 5' 1"  (1.549 m)   Wt 118 lb (53.5 kg)   BMI 22.30 kg/m  Wt Readings from Last 3 Encounters:  08/04/21 118 lb (53.5 kg)  06/24/21 117 lb 12.8 oz (53.4 kg)  03/18/21 117 lb (53.1 kg)    There are no preventive care reminders to display for this patient.  There are no preventive care reminders to display for this patient.   Lab Results  Component Value Date   TSH 1.190 11/03/2019   Lab Results  Component Value Date   WBC 5.7 06/24/2021   HGB 12.4 06/24/2021   HCT 37.4 06/24/2021   MCV 95 06/24/2021   PLT 278 06/24/2021   Lab Results  Component Value Date   NA 137 06/24/2021   K 4.8 06/24/2021   [name] 27 06/24/2021   GLUCOSE 93 06/24/2021   BUN 18 06/24/2021   CREATININE 0.67 06/24/2021   BILITOT <0.2 06/24/2021   ALKPHOS 41 (L) 06/24/2021   AST 16 06/24/2021   ALT 11 06/24/2021   PROT 6.2 06/24/2021   ALBUMIN 4.4 06/24/2021   CALCIUM 9.7 06/24/2021   ANIONGAP 8 09/12/2016    EGFR 89 06/24/2021   Lab Results  Component Value Date   CHOL 229 (H) 06/24/2021   Lab Results  Component Value Date   HDL 62 06/24/2021   Lab Results  Component Value Date   LDLCALC 136 (H) 06/24/2021   Lab Results  Component Value Date   TRIG 174 (H) 06/24/2021   Lab Results  Component Value Date   CHOLHDL 3.7 06/24/2021   Lab Results  Component Value Date   HGBA1C 5.7 06/24/2021       Assessment & Plan:   Problem List Items Addressed This Visit       Other   Groin pain, chronic, right - Primary    worsening right groin pain, history of intraabdominal and pelvic swelling mass and lump. Referral completed for [place] surgery for reevaluation. Lidocaine pain patch 5% every 24 hours, 40 depomedrol shot in clinic  Education provided with printed hand out given. Patient knows to follow up with worsening unresolved symptoms        Relevant Medications   lidocaine (LIDODERM) 5 %   Other Relevant Orders   Ambulatory referral to General Surgery     Meds ordered this encounter  Medications   lidocaine (LIDODERM) 5 %    Sig: Place 1 patch onto the skin daily. Remove & Discard patch within 12 hours or as directed by MD    Dispense:  30 patch    Refill:  0    Order Specific Question:  Supervising Provider    Answer:   Janora Norlander [[id number]]     Ivy Lynn, NP

## 2021-08-04 NOTE — Assessment & Plan Note (Signed)
worsening right groin pain, history of intraabdominal and pelvic swelling mass and lump. Referral completed for [place] surgery for reevaluation. Lidocaine pain patch 5% every 24 hours, 40 depomedrol shot in clinic  Education provided with printed hand out given. Patient knows to follow up with worsening unresolved symptoms

## 2021-08-05 ENCOUNTER — Telehealth: Payer: Self-pay | Admitting: Nurse Practitioner

## 2021-08-05 MED ORDER — METHYLPREDNISOLONE ACETATE 40 MG/ML IJ SUSP
40.0000 mg | Freq: Once | INTRAMUSCULAR | Status: AC
  Administered 2021-07-28: 40 mg via INTRAMUSCULAR

## 2021-08-05 NOTE — Telephone Encounter (Signed)
Key: [id number] - PA Case ID: [id number] - Rx #: [id number] Need help? Call us at [phone] Status Sent to [facility] Lidocaine 5% patches

## 2021-08-05 NOTE — Addendum Note (Signed)
Addended byDory Peru on: 08/05/2021 09:45 AM   Modules accepted: Orders

## 2021-08-06 NOTE — Telephone Encounter (Signed)
Denied today  Your request has been denied

## 2021-08-06 NOTE — Telephone Encounter (Signed)
Pt aware.

## 2021-08-06 NOTE — Telephone Encounter (Signed)
Tell her to buy the Lidoderm patches OTC.

## 2021-08-08 ENCOUNTER — Telehealth: Payer: Self-pay | Admitting: Family Medicine

## 2021-08-12 NOTE — Telephone Encounter (Signed)
Please make patient aware

## 2021-08-12 NOTE — Telephone Encounter (Signed)
Patient aware and verbalizes understanding. 

## 2021-08-21 ENCOUNTER — Telehealth: Payer: Self-pay | Admitting: *Deleted

## 2021-08-21 NOTE — Telephone Encounter (Signed)
Prolia is here in fridge with pt name on it - I have LM for her to call back to sched triage/nurse visit for this injection.  Aware to call back for appt

## 2021-08-25 ENCOUNTER — Telehealth: Payer: Self-pay | Admitting: Family Medicine

## 2021-08-28 ENCOUNTER — Ambulatory Visit (INDEPENDENT_AMBULATORY_CARE_PROVIDER_SITE_OTHER): Payer: Medicare Other | Admitting: *Deleted

## 2021-08-28 ENCOUNTER — Other Ambulatory Visit: Payer: Self-pay

## 2021-08-28 DIAGNOSIS — M810 Age-related osteoporosis without current pathological fracture: Secondary | ICD-10-CM | POA: Diagnosis not present

## 2021-08-28 MED ORDER — DENOSUMAB 60 MG/ML ~~LOC~~ SOSY
60.0000 mg | PREFILLED_SYRINGE | Freq: Once | SUBCUTANEOUS | Status: AC
  Administered 2021-08-28: 60 mg via SUBCUTANEOUS

## 2021-08-28 NOTE — Progress Notes (Signed)
Pt tolerated Prolia injection in right arm well

## 2021-09-25 ENCOUNTER — Ambulatory Visit: Payer: Medicare Other

## 2021-09-30 ENCOUNTER — Ambulatory Visit: Payer: Medicare Other | Attending: Surgery | Admitting: Physical Therapy

## 2021-09-30 ENCOUNTER — Other Ambulatory Visit: Payer: Self-pay

## 2021-09-30 ENCOUNTER — Encounter: Payer: Self-pay | Admitting: Physical Therapy

## 2021-09-30 DIAGNOSIS — M79651 Pain in right thigh: Secondary | ICD-10-CM | POA: Insufficient documentation

## 2021-09-30 NOTE — Therapy (Signed)
[facility] [address] [place], [place], [address] Phone: [phone]   Fax:  [phone]  Physical Therapy Evaluation  Patient Details  Name: Dorothy Pitts MRN: [id number] Date of Birth: 24-Sep-1941 Referring Provider (PT): Phylliss Blakes   Encounter Date: 09/30/2021   PT End of Session - 09/30/21 1224     Visit Number 1    Number of Visits 8    Date for PT Re-Evaluation 10/28/21    PT Start Time 1030    PT Stop Time 1114    PT Time Calculation (min) 44 min    Activity Tolerance Patient tolerated treatment well    Behavior During Therapy [facility] for tasks assessed/performed             Past Medical History:  Diagnosis Date   Acute diverticulitis 10/07/2013   With microperforation.   Anemia 10/08/2013   Bowel obstruction (HCC)    Cataract    Diverticulosis of colon (without mention of hemorrhage)    GERD (gastroesophageal reflux disease)    Hemorrhoids    HOH (hard of hearing)    Hyperlipidemia    Hypertension    IBS (irritable bowel syndrome)    Osteoporosis    PONV (postoperative nausea and vomiting)    Thoracic aortic atherosclerosis (HCC)    UTI (lower urinary tract infection) 10/07/2013   Vitamin D deficiency     Past Surgical History:  Procedure Laterality Date   ABDOMINAL HYSTERECTOMY     APPENDECTOMY     BILATERAL OOPHORECTOMY     BREAST LUMPECTOMY Left    CATARACT EXTRACTION     COLON RESECTION N/A 12/01/2013   Procedure: HAND ASSISTED LAPAROSCOPIC PARTIAL COLECTOMY;  Surgeon: Dalia Heading, MD;  Location: [facility];  Service: General;  Laterality: N/A;   EYE SURGERY     HEMORRHOID SURGERY     HERNIA REPAIR  09/04/2016   LAPAROSCOPIC VENTRAL INCISIONAL HERNIA REPAIR POSSIBLE OPEN (N/A)   INCISIONAL HERNIA REPAIR N/A 09/04/2016   Procedure: LAPAROSCOPIC VENTRAL INCISIONAL HERNIA REPAIR POSSIBLE OPEN;  Surgeon: Ovidio Kin, MD;  Location: [facility];  Service: General;  Laterality: N/A;   INSERTION OF MESH  N/A 09/04/2016   Procedure: INSERTION OF MESH;  Surgeon: Ovidio Kin, MD;  Location: [facility];  Service: General;  Laterality: N/A;   PARTIAL HYSTERECTOMY     TONSILLECTOMY      There were no vitals filed for this visit.    Subjective Assessment - 09/30/21 1236     Subjective COVID-19 screen performed prior to patient entering clinic.  The patient presents to the clinic today with c/o right groin pain.  She states the pain occurred over a month ago after golfing and her pain was severe.  While sitted today she has no pain but states that after siting for awhile and then getting up the pain can become very intense. The pain goes into her right groin regiona nd proximal medial thigh. Walking makes it better.  She reports no back pain.    Pertinent History OP, OA, HTN, hernia repair.    How long can you sit comfortably? Unlimited but pain increases upon standing.    How long can you stand comfortably? Not a problem.    How long can you walk comfortably? Not a problem.    Diagnostic tests Per MD note:  "No inguinal or femoral hernia on exam or CT scan"    Patient Stated Goals Sit and then get up with no pain and return to  golf.    Currently in Pain? No/denies                [facility] PT Assessment - 09/30/21 0001       Assessment   Medical Diagnosis Inguinodynia pain    Referring Provider (PT) Phylliss Blakes    Onset Date/Surgical Date --   One month+.     Precautions   Precaution Comments OP.      Restrictions   Weight Bearing Restrictions No      Balance Screen   Has the patient fallen in the past 6 months No    Has the patient had a decrease in activity level because of a fear of falling?  No    Is the patient reluctant to leave their home because of a fear of falling?  No      Home Environment   Living Environment Private residence      Prior Function   Level of Independence Independent      Observation/Other Assessments   Focus on Therapeutic Outcomes (FOTO)  Complete.       Deep Tendon Reflexes   DTR Assessment Site Patella;Achilles    Patella DTR 2+    Achilles DTR 2+      ROM / Strength   AROM / PROM / Strength AROM;Strength      AROM   Overall AROM Comments Normal right hip range of motion.      Strength   Overall Strength Comments Normal right hip strength.      Palpation   Palpation comment Tender to palpation over right proxial adductor muscle group.      Special Tests   Other special tests (-) right Hip Scour test.  No right SIJ pain.      Ambulation/Gait   Gait Comments WNL.                        Objective measurements completed on examination: See above findings.       [facility] Adult PT Treatment/Exercise - 09/30/21 0001       Modalities   Modalities Electrical Stimulation;Moist Heat      Moist Heat Therapy   Number Minutes Moist Heat 15 Minutes    Moist Heat Location --   Right groin region.     Programme researcher, broadcasting/film/video Location Right adductor muscle group.    Electrical Stimulation Action Pre-mod.    Electrical Stimulation Parameters 80-150 Hz. x 15 minutes (5 sec on, 5 sec off).                          PT Long Term Goals - 09/30/21 1256       PT LONG TERM GOAL #1   Title Independent with a HEP.    Time 4    Period Weeks    Status New      PT LONG TERM GOAL #2   Title Sit 20 minutes and then walk without right groin pain.    Time 4    Period Weeks    Status New      PT LONG TERM GOAL #3   Title Golf without groin pain.    Time 4    Period Weeks    Status New                    Plan - 09/30/21 1252     Clinical Impression Statement The  patient presents to OPPT with c/o right groin pain that can become quite severe after sitting awhile and then getting up to walk.  After she walks a bit the pain subsides a lot.  Her CC finding today was palpable pain over her right proximal adductor muscle group.  She has no c/o SIJ or lumbar pain.  Her  LE DTR's are normal.  Hip Scour test is negative.  Patient will benefit from skilled physical therapy intervention to address pain and deficits.    Personal Factors and Comorbidities Other    Examination-Activity Limitations Sit;Locomotion Level;Other    Examination-Participation Restrictions Other    Stability/Clinical Decision Making Stable/Uncomplicated    Clinical Decision Making Low    Rehab Potential Excellent    PT Frequency 2x / week    PT Duration 4 weeks    PT Treatment/Interventions ADLs/Self Care Home Management;Cryotherapy;Electrical Stimulation;Ultrasound;Moist Heat;Iontophoresis 4mg /ml Dexamethasone;Therapeutic activities;Therapeutic exercise;Manual techniques;Patient/family education;Dry needling    PT Next Visit Plan Combo e'stim/US, STW/M, IFC at 80-150 Hz on 40% scan with HMP.  Pain-free adductor exercises.    Consulted and Agree with Plan of Care Patient             Patient will benefit from skilled therapeutic intervention in order to improve the following deficits and impairments:  Pain  Visit Diagnosis: Pain in right thigh - Plan: PT plan of care cert/re-cert     Problem List Patient Active Problem List   Diagnosis Date Noted   History of prediabetes 06/24/2021   Vitamin D deficiency 11/03/2019   Decreased hearing of both ears 11/03/2019   Primary insomnia 11/03/2019   Groin pain, chronic, right 04/24/2019   Hyperlipidemia 08/28/2015   Thoracic aortic atherosclerosis (HCC) 04/15/2015   Osteoporosis 11/07/2014   Diverticulosis of colon    Essential hypertension    IBS (irritable bowel syndrome)     Megyn Leng, 11/09/2014, PT 09/30/2021, 12:58 PM  [facility] [address] [place], [place], [place] Phone: [phone]   Fax:  [phone]  Name: Dorothy Pitts MRN: [name] Date of Birth: [birth year]

## 2021-10-02 ENCOUNTER — Other Ambulatory Visit: Payer: Self-pay

## 2021-10-02 ENCOUNTER — Ambulatory Visit: Payer: Medicare Other

## 2021-10-02 DIAGNOSIS — M79651 Pain in right thigh: Secondary | ICD-10-CM

## 2021-10-02 NOTE — Therapy (Signed)
[facility] [address] [place], [place], [address] Phone: [phone]   Fax:  [phone]  Physical Therapy Treatment  Patient Details  Name: Dorothy Pitts MRN: [id number] Date of Birth: 08/27/1941 Referring Provider (PT): Phylliss Blakes   Encounter Date: 10/02/2021   PT End of Session - 10/02/21 1448     Visit Number 2    Number of Visits 8    Date for PT Re-Evaluation 10/28/21    PT Start Time 1431    PT Stop Time 1513    PT Time Calculation (min) 42 min    Activity Tolerance Patient tolerated treatment well    Behavior During Therapy [facility] for tasks assessed/performed             Past Medical History:  Diagnosis Date   Acute diverticulitis 10/07/2013   With microperforation.   Anemia 10/08/2013   Bowel obstruction (HCC)    Cataract    Diverticulosis of colon (without mention of hemorrhage)    GERD (gastroesophageal reflux disease)    Hemorrhoids    HOH (hard of hearing)    Hyperlipidemia    Hypertension    IBS (irritable bowel syndrome)    Osteoporosis    PONV (postoperative nausea and vomiting)    Thoracic aortic atherosclerosis (HCC)    UTI (lower urinary tract infection) 10/07/2013   Vitamin D deficiency     Past Surgical History:  Procedure Laterality Date   ABDOMINAL HYSTERECTOMY     APPENDECTOMY     BILATERAL OOPHORECTOMY     BREAST LUMPECTOMY Left    CATARACT EXTRACTION     COLON RESECTION N/A 12/01/2013   Procedure: HAND ASSISTED LAPAROSCOPIC PARTIAL COLECTOMY;  Surgeon: Dalia Heading, MD;  Location: [facility] ORS;  Service: General;  Laterality: N/A;   EYE SURGERY     HEMORRHOID SURGERY     HERNIA REPAIR  09/04/2016   LAPAROSCOPIC VENTRAL INCISIONAL HERNIA REPAIR POSSIBLE OPEN (N/A)   INCISIONAL HERNIA REPAIR N/A 09/04/2016   Procedure: LAPAROSCOPIC VENTRAL INCISIONAL HERNIA REPAIR POSSIBLE OPEN;  Surgeon: Ovidio Kin, MD;  Location: [facility] OR;  Service: General;  Laterality: N/A;   INSERTION OF MESH  N/A 09/04/2016   Procedure: INSERTION OF MESH;  Surgeon: Ovidio Kin, MD;  Location: [facility] OR;  Service: General;  Laterality: N/A;   PARTIAL HYSTERECTOMY     TONSILLECTOMY      There were no vitals filed for this visit.   Subjective Assessment - 10/02/21 1430     Subjective COVID-19 screen performed prior to patient entering clinic. Patient reports that her right hip is feeling better today. However, her left hip his hurting a little for some reason.    Pertinent History OP, OA, HTN, hernia repair.    How long can you sit comfortably? Unlimited but pain increases upon standing.    How long can you stand comfortably? Not a problem.    How long can you walk comfortably? Not a problem.    Diagnostic tests Per MD note:  "No inguinal or femoral hernia on exam or CT scan"    Patient Stated Goals Sit and then get up with no pain and return to golf.    Currently in Pain? No/denies                               [facility] Adult PT Treatment/Exercise - 10/02/21 0001       Exercises   Exercises Knee/Hip  Knee/Hip Exercises: Aerobic   Nustep L4 x 12 minutes      Knee/Hip Exercises: Standing   Hip Abduction Both;20 reps;Knee straight      Knee/Hip Exercises: Supine   Hip Adduction Isometric Both;20 reps   5 second hold; seated   Bridges Both;20 reps;Strengthening    Straight Leg Raises Both;20 reps    Other Supine Knee/Hip Exercises Bent knee fall out   red t-band; 2 minutes                Balance Exercises - 10/02/21 0001       Balance Exercises: Standing   Standing Eyes Opened Narrow base of support (BOS);Foam/compliant surface;4 reps;30 secs                     PT Long Term Goals - 09/30/21 1256       PT LONG TERM GOAL #1   Title Independent with a HEP.    Time 4    Period Weeks    Status New      PT LONG TERM GOAL #2   Title Sit 20 minutes and then walk without right groin pain.    Time 4    Period Weeks    Status New      PT  LONG TERM GOAL #3   Title Golf without groin pain.    Time 4    Period Weeks    Status New                   Plan - 10/02/21 1455     Clinical Impression Statement Patient presented to treatment with no hip pain or discomfort. She was introduced to multiple new interventions for hip strength with moderate difficulty and fatigue. She required minimal to moderat multimodal cuing for proper biomechanics and pacing. Her HEP was updated to facilitate improved hip strength and mobility. She reported that her hip felt good upon the conclusion of treatment. She continues to require skilled physical therapy to address her remaining impairments to return to her prior level of function.    Personal Factors and Comorbidities Other    Examination-Activity Limitations Sit;Locomotion Level;Other    Examination-Participation Restrictions Other    Stability/Clinical Decision Making Stable/Uncomplicated    Rehab Potential Excellent    PT Frequency 2x / week    PT Duration 4 weeks    PT Treatment/Interventions ADLs/Self Care Home Management;Cryotherapy;Electrical Stimulation;Ultrasound;Moist Heat;Iontophoresis 4mg /ml Dexamethasone;Therapeutic activities;Therapeutic exercise;Manual techniques;Patient/family education;Dry needling    PT Next Visit Plan Combo e'stim/US, STW/M, IFC at 80-150 Hz on 40% scan with HMP.  Pain-free adductor exercises.    PT Home Exercise Plan Access Code: [email or web address]  URL: [email or web address]  Date: 10/02/2021  Prepared by: 14/05/2021    Exercises  Supine Bridge - 1 x daily - 7 x weekly - 2 sets - 10 reps  Bent Knee Fallouts - 1 x daily - 7 x weekly - 2 sets - 10 reps  Supine Active Straight Leg Raise - 1 x daily - 7 x weekly - 2 sets - 10 reps  Seated Hip Adduction Isometrics with Ball - 2 x daily - 7 x weekly - 2 sets - 10 reps - 5 seconds hold    Consulted and Agree with Plan of Care Patient             Patient will benefit from skilled therapeutic  intervention in order to improve the following deficits and impairments:  Pain  Visit Diagnosis: Pain in  right thigh     Problem List Patient Active Problem List   Diagnosis Date Noted   History of prediabetes 06/24/2021   Vitamin D deficiency 11/03/2019   Decreased hearing of both ears 11/03/2019   Primary insomnia 11/03/2019   Groin pain, chronic, right 04/24/2019   Hyperlipidemia 08/28/2015   Thoracic aortic atherosclerosis (HCC) 04/15/2015   Osteoporosis 11/07/2014   Diverticulosis of colon    Essential hypertension    IBS (irritable bowel syndrome)     Granville Lewis, PT 10/02/2021, 4:37 PM  [facility] [address] [place], [place], [address] Phone: [phone]   Fax:  [phone]  Name: Dorothy Pitts MRN: [id number] Date of Birth: 04-16-1941

## 2021-10-06 ENCOUNTER — Ambulatory Visit: Payer: Medicare Other | Admitting: Physical Therapy

## 2021-10-06 ENCOUNTER — Encounter: Payer: Self-pay | Admitting: Nurse Practitioner

## 2021-10-06 ENCOUNTER — Ambulatory Visit (INDEPENDENT_AMBULATORY_CARE_PROVIDER_SITE_OTHER): Payer: Medicare Other | Admitting: Nurse Practitioner

## 2021-10-06 DIAGNOSIS — J069 Acute upper respiratory infection, unspecified: Secondary | ICD-10-CM

## 2021-10-06 MED ORDER — BENZONATATE 100 MG PO CAPS: 100.0000 mg | ORAL_CAPSULE | Freq: Three times a day (TID) | ORAL | 0 refills | Status: DC | PRN

## 2021-10-06 MED ORDER — PREDNISONE 20 MG PO TABS: 40.0000 mg | ORAL_TABLET | Freq: Every day | ORAL | 0 refills | Status: AC

## 2021-10-06 MED ORDER — AZITHROMYCIN 250 MG PO TABS: ORAL_TABLET | ORAL | 0 refills | Status: DC

## 2021-10-06 NOTE — Progress Notes (Signed)
Virtual Visit  Note Due to COVID-19 pandemic this visit was conducted virtually. This visit type was conducted due to national recommendations for restrictions regarding the COVID-19 Pandemic (e.g. social distancing, sheltering in place) in an effort to limit this patient's exposure and mitigate transmission in our community. All issues noted in this document were discussed and addressed.  A physical exam was not performed with this format.  I connected with Dorothy Pitts on 10/06/21 at 11:28 by telephone and verified that I am speaking with the correct person using two identifiers. Dorothy Pitts is currently located at home and no one  is currently with her during visit. The provider, Mary-Margaret Daphine Deutscher, FNP is located in their office at time of visit.  I discussed the limitations, risks, security and privacy concerns of performing an evaluation and management service by telephone and the availability of in person appointments. I also discussed with the patient that there may be a patient responsible charge related to this service. The patient expressed understanding and agreed to proceed.   History and Present Illness:  Patient states that late friday afternoon she developed cough  congestion, runny nose and sneezing. Since then she has had  pain when she chews. PND. She has taken tylenol and saline nasal spray. Thera zinc and OTC cough syrup.      Review of Systems  Constitutional:  Positive for chills and fever (low grade). Negative for malaise/fatigue.  HENT:  Positive for congestion and sore throat.   Respiratory:  Positive for cough.   Musculoskeletal:  Negative for myalgias.  Neurological:  Negative for headaches.    Observations/Objective: Alert and oriented- answers all questions appropriately No distress Voice raspy Dry cough noted Out of treatment window for flu so did not have ehr ocme into be tested Assessment and Plan: Dorothy Pitts in today with chief  complaint of No chief complaint on file.   1. URI with cough and congestion 1. Take meds as prescribed 2. Use a cool mist humidifier especially during the winter months and when heat has been humid. 3. Use saline nose sprays frequently 4. Saline irrigations of the nose can be very helpful if done frequently.  * 4X daily for 1 week*  * Use of a nettie pot can be helpful with this. Follow directions with this* 5. Drink plenty of fluids 6. Keep thermostat turn down low 7.For any cough or congestion- tessalon perles 8. For fever or aces or pains- take tylenol or ibuprofen appropriate for age and weight.  * for fevers greater than 101 orally you may alternate ibuprofen and tylenol every  3 hours.    Meds ordered this encounter  Medications   predniSONE (DELTASONE) 20 MG tablet    Sig: Take 2 tablets (40 mg total) by mouth daily with breakfast for 5 days. 2 po daily for 5 days    Dispense:  10 tablet    Refill:  0    Order Specific Question:   Supervising Provider    Answer:   Arville Care A [[id number]]   benzonatate (TESSALON PERLES) 100 MG capsule    Sig: Take 1 capsule (100 mg total) by mouth 3 (three) times daily as needed for cough.    Dispense:  20 capsule    Refill:  0    Order Specific Question:   Supervising Provider    Answer:   Arville Care A [[id number]]   azithromycin (ZITHROMAX Z-PAK) 250 MG tablet    Sig: As directed  Dispense:  6 tablet    Refill:  0    Order Specific Question:   Supervising Provider    Answer:   Arville Care A [[id number]]      Follow Up Instructions: prn    I discussed the assessment and treatment plan with the patient. The patient was provided an opportunity to ask questions and all were answered. The patient agreed with the plan and demonstrated an understanding of the instructions.   The patient was advised to call back or seek an in-person evaluation if the symptoms worsen or if the condition fails to improve as  anticipated.  The above assessment and management plan was discussed with the patient. The patient verbalized understanding of and has agreed to the management plan. Patient is aware to call the clinic if symptoms persist or worsen. Patient is aware when to return to the clinic for a follow-up visit. Patient educated on when it is appropriate to go to the emergency department.   Time call ended:  11:40  I provided 12 minutes of  non face-to-face time during this encounter.    Mary-Margaret Daphine Deutscher, FNP

## 2021-10-13 ENCOUNTER — Ambulatory Visit: Payer: Medicare Other | Admitting: Physical Therapy

## 2021-10-31 ENCOUNTER — Ambulatory Visit (INDEPENDENT_AMBULATORY_CARE_PROVIDER_SITE_OTHER): Payer: Medicare Other | Admitting: Family Medicine

## 2021-10-31 ENCOUNTER — Encounter: Payer: Self-pay | Admitting: Family Medicine

## 2021-10-31 DIAGNOSIS — J069 Acute upper respiratory infection, unspecified: Secondary | ICD-10-CM

## 2021-10-31 MED ORDER — BENZONATATE 100 MG PO CAPS: 100.0000 mg | ORAL_CAPSULE | Freq: Three times a day (TID) | ORAL | 1 refills | Status: DC | PRN

## 2021-10-31 NOTE — Progress Notes (Signed)
Virtual Visit via telephone Note  I connected with Dorothy Pitts on 10/31/21 at 1757 by telephone and verified that I am speaking with the correct person using two identifiers. Dorothy Pitts is currently located at home and patient are currently with her during visit. The provider, Fransisca Kaufmann Zoii Florer, MD is located in their office at time of visit.  Call ended at 1802  I discussed the limitations, risks, security and privacy concerns of performing an evaluation and management service by telephone and the availability of in person appointments. I also discussed with the patient that there may be a patient responsible charge related to this service. The patient expressed understanding and agreed to proceed.   History and Present Illness: Patient started with cough and congestion and sinus pressure.  She used saline and claritin and lozenges and they are improving the situation but not clearing.  She took a cough medicine last night and was improving. She denies fevers or chills.   1. URI with cough and congestion     Outpatient Encounter Medications as of 10/31/2021  Medication Sig   Ascorbic Acid (VITAMIN C) 1000 MG tablet Take 1,000 mg by mouth daily. Reported on 11/21/2015   aspirin EC 81 MG tablet Take 81 mg by mouth daily. Swallow whole. (Patient not taking: Reported on 09/30/2021)   azithromycin (ZITHROMAX Z-PAK) 250 MG tablet As directed   benzonatate (TESSALON PERLES) 100 MG capsule Take 1 capsule (100 mg total) by mouth 3 (three) times daily as needed for cough.   Calcium Carbonate-Vitamin D (CALTRATE 600+D) 600-400 MG-UNIT tablet Take 1 tablet by mouth daily.   Cholecalciferol (VITAMIN D3) 5000 units CAPS Take 1 capsule by mouth every Monday, Wednesday, and Friday.   denosumab (PROLIA) 60 MG/ML SOSY injection Inject 60 mg into the skin every 6 (six) months.   enalapril (VASOTEC) 5 MG tablet Take 1 tablet (5 mg total) by mouth daily.   famotidine-calcium carbonate-magnesium  hydroxide (PEPCID COMPLETE) 10-800-165 MG chewable tablet Chew 1 tablet by mouth daily.   lidocaine (LIDODERM) 5 % Place 1 patch onto the skin daily. Remove & Discard patch within 12 hours or as directed by MD   loratadine (CLARITIN) 10 MG tablet Take 10 mg by mouth daily.   nystatin cream (MYCOSTATIN) Apply 1 application topically 2 (two) times daily.   Polyethylene Glycol 3350 (MIRALAX PO) Take 1 Dose by mouth daily.   Probiotic Product (ALIGN PO) Take 1 tablet by mouth daily.   rosuvastatin (CRESTOR) 5 MG tablet Take 1 tablet (5 mg total) by mouth 3 (three) times a week. (Patient not taking: Reported on 09/30/2021)   spironolactone (ALDACTONE) 50 MG tablet TAKE 1 TABLET BY MOUTH EVERY DAY   triamcinolone cream (KENALOG) 0.1 % APPLY TO AFFECTED AREA TWICE A DAY   vitamin B-12 (CYANOCOBALAMIN) 1000 MCG tablet Take 1,000 mcg by mouth daily.   [DISCONTINUED] benzonatate (TESSALON PERLES) 100 MG capsule Take 1 capsule (100 mg total) by mouth 3 (three) times daily as needed for cough.   No facility-administered encounter medications on file as of 10/31/2021.    Review of Systems  Constitutional:  Negative for chills and fever.  HENT:  Positive for congestion, postnasal drip, rhinorrhea and sinus pressure. Negative for ear discharge, ear pain, sneezing and sore throat.   Eyes:  Negative for pain, redness and visual disturbance.  Respiratory:  Positive for cough. Negative for chest tightness and shortness of breath.   Cardiovascular:  Negative for chest pain and leg swelling.  Genitourinary:  Negative for difficulty urinating and dysuria.  Musculoskeletal:  Negative for back pain and gait problem.  Skin:  Negative for rash.  Neurological:  Negative for light-headedness and headaches.  Psychiatric/Behavioral:  Negative for agitation and behavioral problems.   All other systems reviewed and are negative.  Observations/Objective: Patient sounds comfortable and in no acute distress  Assessment and  Plan: Problem List Items Addressed This Visit   None Visit Diagnoses     URI with cough and congestion    -  Primary   Relevant Medications   benzonatate (TESSALON PERLES) 100 MG capsule       Patient says she is improving and just wants more of the cough medicine that helped her.  She ran out of that. Follow up plan: No follow-ups on file.     I discussed the assessment and treatment plan with the patient. The patient was provided an opportunity to ask questions and all were answered. The patient agreed with the plan and demonstrated an understanding of the instructions.   The patient was advised to call back or seek an in-person evaluation if the symptoms worsen or if the condition fails to improve as anticipated.  The above assessment and management plan was discussed with the patient. The patient verbalized understanding of and has agreed to the management plan. Patient is aware to call the clinic if symptoms persist or worsen. Patient is aware when to return to the clinic for a follow-up visit. Patient educated on when it is appropriate to go to the emergency department.    I provided 5 minutes of non-face-to-face time during this encounter.    Worthy Rancher, MD

## 2021-11-17 ENCOUNTER — Telehealth: Payer: Self-pay | Admitting: Family Medicine

## 2021-11-17 NOTE — Telephone Encounter (Signed)
Patient aware we have no record of that

## 2021-11-18 ENCOUNTER — Other Ambulatory Visit: Payer: Self-pay

## 2021-11-18 ENCOUNTER — Ambulatory Visit: Payer: Medicare Other | Attending: Surgery

## 2021-11-18 DIAGNOSIS — M79651 Pain in right thigh: Secondary | ICD-10-CM | POA: Diagnosis present

## 2021-11-18 NOTE — Therapy (Signed)
[facility] [address] [place], [place], [address] Phone: [phone]   Fax:  [phone]  Physical Therapy Treatment  Patient Details  Name: Dorothy Pitts MRN: [id number] Date of Birth: November 14, 1940 Referring Provider (PT): Phylliss Blakeshelsea Connor   Encounter Date: 11/18/2021   PT End of Session - 11/18/21 1258     Visit Number 3    Number of Visits 8    Date for PT Re-Evaluation 10/28/21    PT Start Time 1300    PT Stop Time 1340    PT Time Calculation (min) 40 min    Activity Tolerance Patient tolerated treatment well    Behavior [facility] for tasks assessed/performed             Past Medical History:  Diagnosis Date   Acute diverticulitis 10/07/2013   With microperforation.   Anemia 10/08/2013   Bowel obstruction (HCC)    Cataract    Diverticulosis of colon (without mention of hemorrhage)    GERD (gastroesophageal reflux disease)    Hemorrhoids    HOH (hard of hearing)    Hyperlipidemia    Hypertension    IBS (irritable bowel syndrome)    Osteoporosis    PONV (postoperative nausea and vomiting)    Thoracic aortic atherosclerosis (HCC)    UTI (lower urinary tract infection) 10/07/2013   Vitamin D deficiency     Past Surgical History:  Procedure Laterality Date   ABDOMINAL HYSTERECTOMY     APPENDECTOMY     BILATERAL OOPHORECTOMY     BREAST LUMPECTOMY Left    CATARACT EXTRACTION     COLON RESECTION N/A 12/01/2013   Procedure: HAND ASSISTED LAPAROSCOPIC PARTIAL COLECTOMY;  Surgeon: Dalia HeadingMark A Jenkins, MD;  Location: [facility];  Service: General;  Laterality: N/A;   EYE SURGERY     HEMORRHOID SURGERY     HERNIA REPAIR  09/04/2016   LAPAROSCOPIC VENTRAL INCISIONAL HERNIA REPAIR POSSIBLE OPEN (N/A)   INCISIONAL HERNIA REPAIR N/A 09/04/2016   Procedure: LAPAROSCOPIC VENTRAL INCISIONAL HERNIA REPAIR POSSIBLE OPEN;  Surgeon: Ovidio Kinavid Newman, MD;  Location: [facility] OR;  Service: General;  Laterality: N/A;   INSERTION OF MESH  N/A 09/04/2016   Procedure: INSERTION OF MESH;  Surgeon: Ovidio Kinavid Newman, MD;  Location: [facility] OR;  Service: General;  Laterality: N/A;   PARTIAL HYSTERECTOMY     TONSILLECTOMY      There were no vitals filed for this visit.   Subjective Assessment - 11/18/21 1256     Subjective COVID-19 screen performed prior to patient entering clinic.  Pt arrives for today's treatment session denying any pain. Pt reports she felt really good after the last treatment session.    Pertinent History OP, OA, HTN, hernia repair.    How long can you sit comfortably? Unlimited but pain increases upon standing.    How long can you stand comfortably? Not a problem.    How long can you walk comfortably? Not a problem.    Diagnostic tests Per MD note:  "No inguinal or femoral hernia on exam or CT scan"    Patient Stated Goals Sit and then get up with no pain and return to golf.    Currently in Pain? No/denies                               [facility] Adult [facility] Treatment/Exercise - 11/18/21 0001       Knee/Hip Exercises: Aerobic   Nustep  Lvl 4 x 15 mins      Knee/Hip Exercises: Standing   Hip Abduction Both;20 reps;Knee straight    Hip Extension Both;20 reps;Knee straight    Forward Step Up Both;Hand Hold: 0;Step Height: 6"   2 mins each     Knee/Hip Exercises: Supine   Hip Adduction Isometric Both   Ball squeeze x 2 mins   Bridges Both;20 reps;Strengthening    Straight Leg Raises Both;20 reps    Other Supine Knee/Hip Exercises Clamshells 2 mins red tband    Other Supine Knee/Hip Exercises Marching x 2 mins BLE                          PT Long Term Goals - 09/30/21 1256       PT LONG TERM GOAL #1   Title Independent with a HEP.    Time 4    Period Weeks    Status New      PT LONG TERM GOAL #2   Title Sit 20 minutes and then walk without right groin pain.    Time 4    Period Weeks    Status New      PT LONG TERM GOAL #3   Title Golf without groin pain.    Time 4     Period Weeks    Status New                   Plan - 11/18/21 1258     Clinical Impression Statement Pt arrives for today's treatment session denying any pain.  Pt states that she has not really been doing her exercises at home, but that she stays "on the move most of the time."  Pt able to tolerate numerous standing exercises today for BLE and hip strengthening.  Pt requiring seated rest breaks as needed due to fatigue.  Pt requiring cues for proper technique and hold with supine exercises.  Pt requiring rest breaks between exercises due to BLE fatigue.  Pt denied any pain at completion of today's treatment session.    Personal Factors and Comorbidities Other    Examination-Activity Limitations Sit;Locomotion Level;Other    Examination-Participation Restrictions Other    Stability/Clinical Decision Making Stable/Uncomplicated    Rehab Potential Excellent    PT Frequency 2x / week    PT Duration 4 weeks    PT Treatment/Interventions ADLs/Self Care Home Management;Cryotherapy;Electrical Stimulation;Ultrasound;Moist Heat;Iontophoresis 4mg /ml Dexamethasone;Therapeutic activities;Therapeutic exercise;Manual techniques;Patient/family education;Dry needling    PT Next Visit Plan Combo e'stim/US, STW/M, IFC at 80-150 Hz on 40% scan with HMP.  Pain-free adductor exercises.    PT Home Exercise Plan Access Code: [email or web address]  URL: [email or web address]  Date: 10/02/2021  Prepared by: 14/05/2021    Exercises  Supine Bridge - 1 x daily - 7 x weekly - 2 sets - 10 reps  Bent Knee Fallouts - 1 x daily - 7 x weekly - 2 sets - 10 reps  Supine Active Straight Leg Raise - 1 x daily - 7 x weekly - 2 sets - 10 reps  Seated Hip Adduction Isometrics with Ball - 2 x daily - 7 x weekly - 2 sets - 10 reps - 5 seconds hold    Consulted and Agree with Plan of Care Patient             Patient will benefit from skilled therapeutic intervention in order to improve the following deficits and  impairments:  Pain  Visit Diagnosis: Pain  in right thigh     Problem List Patient Active Problem List   Diagnosis Date Noted   History of prediabetes 06/24/2021   Vitamin D deficiency 11/03/2019   Decreased hearing of both ears 11/03/2019   Primary insomnia 11/03/2019   Groin pain, chronic, right 04/24/2019   Hyperlipidemia 08/28/2015   Thoracic aortic atherosclerosis (HCC) 04/15/2015   Osteoporosis 11/07/2014   Diverticulosis of colon    Essential hypertension    IBS (irritable bowel syndrome)     Newman Pies, PTA 11/18/2021, 1:42 PM  [facility] [address] [place], [place], [address] Phone: [phone]   Fax:  [phone]  Name: Dorothy Pitts MRN: [id number] Date of Birth: [birth year]

## 2021-11-21 ENCOUNTER — Ambulatory Visit: Payer: Medicare Other

## 2021-11-21 ENCOUNTER — Other Ambulatory Visit: Payer: Self-pay

## 2021-11-21 DIAGNOSIS — M79651 Pain in right thigh: Secondary | ICD-10-CM | POA: Diagnosis not present

## 2021-11-21 NOTE — Therapy (Signed)
[facility] [address] [place], [place], [address] Phone: [phone]   Fax:  [phone]  Physical Therapy Treatment  Patient Details  Name: Dorothy Pitts MRN: [id number] Date of Birth: 04-23-1941 Referring Provider (PT): Phylliss Blakes   Encounter Date: 11/21/2021   PT End of Session - 11/21/21 1127     Visit Number 4    Number of Visits 8    Date for PT Re-Evaluation 10/28/21    PT Start Time 1115    PT Stop Time 1155    PT Time Calculation (min) 40 min    Activity Tolerance Patient tolerated treatment well    Behavior During Therapy [facility] for tasks assessed/performed             Past Medical History:  Diagnosis Date   Acute diverticulitis 10/07/2013   With microperforation.   Anemia 10/08/2013   Bowel obstruction (HCC)    Cataract    Diverticulosis of colon (without mention of hemorrhage)    GERD (gastroesophageal reflux disease)    Hemorrhoids    HOH (hard of hearing)    Hyperlipidemia    Hypertension    IBS (irritable bowel syndrome)    Osteoporosis    PONV (postoperative nausea and vomiting)    Thoracic aortic atherosclerosis (HCC)    UTI (lower urinary tract infection) 10/07/2013   Vitamin D deficiency     Past Surgical History:  Procedure Laterality Date   ABDOMINAL HYSTERECTOMY     APPENDECTOMY     BILATERAL OOPHORECTOMY     BREAST LUMPECTOMY Left    CATARACT EXTRACTION     COLON RESECTION N/A 12/01/2013   Procedure: HAND ASSISTED LAPAROSCOPIC PARTIAL COLECTOMY;  Surgeon: Dalia Heading, MD;  Location: [facility];  Service: General;  Laterality: N/A;   EYE SURGERY     HEMORRHOID SURGERY     HERNIA REPAIR  09/04/2016   LAPAROSCOPIC VENTRAL INCISIONAL HERNIA REPAIR POSSIBLE OPEN (N/A)   INCISIONAL HERNIA REPAIR N/A 09/04/2016   Procedure: LAPAROSCOPIC VENTRAL INCISIONAL HERNIA REPAIR POSSIBLE OPEN;  Surgeon: Ovidio Kin, MD;  Location: [facility] OR;  Service: General;  Laterality: N/A;   INSERTION OF MESH  N/A 09/04/2016   Procedure: INSERTION OF MESH;  Surgeon: Ovidio Kin, MD;  Location: [facility] OR;  Service: General;  Laterality: N/A;   PARTIAL HYSTERECTOMY     TONSILLECTOMY      There were no vitals filed for this visit.   Subjective Assessment - 11/21/21 1126     Subjective COVID-19 screen performed prior to patient entering clinic.  Pt arrives for today's treatment session reporting mild anterior leg soreness.    Pertinent History OP, OA, HTN, hernia repair.    How long can you sit comfortably? Unlimited but pain increases upon standing.    How long can you stand comfortably? Not a problem.    How long can you walk comfortably? Not a problem.    Diagnostic tests Per MD note:  "No inguinal or femoral hernia on exam or CT scan"    Patient Stated Goals Sit and then get up with no pain and return to golf.    Currently in Pain? No/denies                               [facility] Adult PT Treatment/Exercise - 11/21/21 0001       Knee/Hip Exercises: Aerobic   Nustep Lvl 4 x 15 mins  Knee/Hip Exercises: Standing   Hip Abduction Both;20 reps;Knee straight    Hip Extension Both;20 reps;Knee straight    Forward Step Up Both;20 reps;Hand Hold: 2;Step Height: 6"      Knee/Hip Exercises: Seated   Sit to Sand 10 reps;with UE support   Cues for eccentric control     Knee/Hip Exercises: Supine   Hip Adduction Isometric Both   ball squeezes x 2 min   Bridges Both;20 reps;Strengthening    Straight Leg Raises Both;20 reps    Other Supine Knee/Hip Exercises Clamshells 2 mins red tband    Other Supine Knee/Hip Exercises Marching x 2 mins BLE                          PT Long Term Goals - 09/30/21 1256       PT LONG TERM GOAL #1   Title Independent with a HEP.    Time 4    Period Weeks    Status New      PT LONG TERM GOAL #2   Title Sit 20 minutes and then walk without right groin pain.    Time 4    Period Weeks    Status New      PT LONG TERM GOAL  #3   Title Golf without groin pain.    Time 4    Period Weeks    Status New                   Plan - 11/21/21 1127     Clinical Impression Statement Pt arrives for today's treatment session denying anything other than mild soreness in anterior quads. Pt able to tolerate increased exercises in standing without requiring standing rest breaks.  Pt requiring min cues for for proper technique with supine exercises, but was able to perform more reps without rest breaks.  Pt denied any pain at the completion of today's treatment session.    Personal Factors and Comorbidities Other    Examination-Activity Limitations Sit;Locomotion Level;Other    Examination-Participation Restrictions Other    Stability/Clinical Decision Making Stable/Uncomplicated    Rehab Potential Excellent    PT Frequency 2x / week    PT Duration 4 weeks    PT Treatment/Interventions ADLs/Self Care Home Management;Cryotherapy;Electrical Stimulation;Ultrasound;Moist Heat;Iontophoresis 4mg /ml Dexamethasone;Therapeutic activities;Therapeutic exercise;Manual techniques;Patient/family education;Dry needling    PT Next Visit Plan Combo e'stim/US, STW/M, IFC at 80-150 Hz on 40% scan with HMP.  Pain-free adductor exercises.    PT Home Exercise Plan Access Code: [email or web address]  URL: [email or web address]  Date: 10/02/2021  Prepared by: Candi LeashJarrett Barts    Exercises  Supine Bridge - 1 x daily - 7 x weekly - 2 sets - 10 reps  Bent Knee Fallouts - 1 x daily - 7 x weekly - 2 sets - 10 reps  Supine Active Straight Leg Raise - 1 x daily - 7 x weekly - 2 sets - 10 reps  Seated Hip Adduction Isometrics with Ball - 2 x daily - 7 x weekly - 2 sets - 10 reps - 5 seconds hold    Consulted and Agree with Plan of Care Patient             Patient will benefit from skilled therapeutic intervention in order to improve the following deficits and impairments:  Pain  Visit Diagnosis: Pain in right thigh     Problem List Patient  Active Problem List   Diagnosis Date Noted   History of  prediabetes 06/24/2021   Vitamin D deficiency 11/03/2019   Decreased hearing of both ears 11/03/2019   Primary insomnia 11/03/2019   Groin pain, chronic, right 04/24/2019   Hyperlipidemia 08/28/2015   Thoracic aortic atherosclerosis (HCC) 04/15/2015   Osteoporosis 11/07/2014   Diverticulosis of colon    Essential hypertension    IBS (irritable bowel syndrome)     Newman Pies, PTA 11/21/2021, 12:02 PM  [facility] [address] [place], [place], [address] Phone: [phone]   Fax:  [phone]  Name: Dorothy Pitts MRN: [id number] Date of Birth: May 21, 1941

## 2021-11-25 ENCOUNTER — Other Ambulatory Visit: Payer: Self-pay

## 2021-11-25 ENCOUNTER — Ambulatory Visit: Payer: Medicare Other | Admitting: Physical Therapy

## 2021-11-25 ENCOUNTER — Encounter: Payer: Self-pay | Admitting: Physical Therapy

## 2021-11-25 DIAGNOSIS — M79651 Pain in right thigh: Secondary | ICD-10-CM

## 2021-11-25 NOTE — Addendum Note (Signed)
Addended by: Chelli Yerkes, [place] W on: 11/25/2021 03:04 PM   Modules accepted: Orders

## 2021-11-25 NOTE — Therapy (Signed)
[facility] [address] [place], [place], [address] Phone: [phone]   Fax:  [phone]  Physical Therapy Treatment  Patient Details  Name: Dorothy CootsJudith M Borman MRN: [id number] Date of Birth: 1941-01-06 Referring Provider (PT): Phylliss Blakeshelsea Connor   Encounter Date: 11/25/2021   PT End of Session - 11/25/21 1034     Visit Number 5    Number of Visits 8    Date for PT Re-Evaluation 10/28/21    PT Start Time 1031    PT Stop Time 1113    PT Time Calculation (min) 42 min    Activity Tolerance Patient tolerated treatment well    Behavior During Therapy [facility] for tasks assessed/performed             Past Medical History:  Diagnosis Date   Acute diverticulitis 10/07/2013   With microperforation.   Anemia 10/08/2013   Bowel obstruction (HCC)    Cataract    Diverticulosis of colon (without mention of hemorrhage)    GERD (gastroesophageal reflux disease)    Hemorrhoids    HOH (hard of hearing)    Hyperlipidemia    Hypertension    IBS (irritable bowel syndrome)    Osteoporosis    PONV (postoperative nausea and vomiting)    Thoracic aortic atherosclerosis (HCC)    UTI (lower urinary tract infection) 10/07/2013   Vitamin D deficiency     Past Surgical History:  Procedure Laterality Date   ABDOMINAL HYSTERECTOMY     APPENDECTOMY     BILATERAL OOPHORECTOMY     BREAST LUMPECTOMY Left    CATARACT EXTRACTION     COLON RESECTION N/A 12/01/2013   Procedure: HAND ASSISTED LAPAROSCOPIC PARTIAL COLECTOMY;  Surgeon: Dalia HeadingMark A Jenkins, MD;  Location: [facility];  Service: General;  Laterality: N/A;   EYE SURGERY     HEMORRHOID SURGERY     HERNIA REPAIR  09/04/2016   LAPAROSCOPIC VENTRAL INCISIONAL HERNIA REPAIR POSSIBLE OPEN (N/A)   INCISIONAL HERNIA REPAIR N/A 09/04/2016   Procedure: LAPAROSCOPIC VENTRAL INCISIONAL HERNIA REPAIR POSSIBLE OPEN;  Surgeon: Ovidio Kinavid Newman, MD;  Location: [facility] OR;  Service: General;  Laterality: N/A;   INSERTION OF MESH  N/A 09/04/2016   Procedure: INSERTION OF MESH;  Surgeon: Ovidio Kinavid Newman, MD;  Location: [facility] OR;  Service: General;  Laterality: N/A;   PARTIAL HYSTERECTOMY     TONSILLECTOMY      There were no vitals filed for this visit.   Subjective Assessment - 11/25/21 1030     Subjective COVID-19 screen performed prior to patient entering clinic. No new complaints,    Pertinent History OP, OA, HTN, hernia repair.    How long can you sit comfortably? Unlimited but pain increases upon standing.    How long can you stand comfortably? Not a problem.    How long can you walk comfortably? Not a problem.    Diagnostic tests Per MD note:  "No inguinal or femoral hernia on exam or CT scan"    Patient Stated Goals Sit and then get up with no pain and return to golf.    Currently in Pain? No/denies                [facility] PT Assessment - 11/25/21 0001       Assessment   Medical Diagnosis Inguinodynia pain    Referring Provider (PT) Phylliss Blakeshelsea Connor      Precautions   Precaution Comments OP.      Restrictions   Weight Bearing Restrictions No  [facility] Adult PT Treatment/Exercise - 11/25/21 0001       Knee/Hip Exercises: Stretches   Hip Flexor Stretch Left;10 seconds   no detectable stretch   Other Knee/Hip Stretches LTR x5 reps for adductor stretch      Knee/Hip Exercises: Aerobic   Nustep Lvl 4 x 15 mins      Knee/Hip Exercises: Standing   Hip Flexion AROM;Both;20 reps;Knee bent    Forward Lunges Both;20 reps    Hip Abduction Both;20 reps;Knee straight    Hip Extension Both;20 reps;Knee straight    Lateral Step Up Both;20 reps;Hand Hold: 2;Step Height: 6"    Forward Step Up Both;20 reps;Hand Hold: 2;Step Height: 6"      Knee/Hip Exercises: Supine   Bridges with Ball Squeeze Strengthening;Both;20 reps    Straight Leg Raises Both;20 reps    Other Supine Knee/Hip Exercises Clamshells 2 mins green tband    Other Supine Knee/Hip Exercises Marching x 1  mins BLE green theraband      Knee/Hip Exercises: Sidelying   Hip ABduction AROM;Both;20 reps                          PT Long Term Goals - 09/30/21 1256       PT LONG TERM GOAL #1   Title Independent with a HEP.    Time 4    Period Weeks    Status New      PT LONG TERM GOAL #2   Title Sit 20 minutes and then walk without right groin pain.    Time 4    Period Weeks    Status New      PT LONG TERM GOAL #3   Title Golf without groin pain.    Time 4    Period Weeks    Status New                   Plan - 11/25/21 1204     Clinical Impression Statement Patient presented in clinic with no pain but unable to return to her full recreational actiivities such as golf due to recent weather. Patient progressed through BLE strengthening and stretching. Patient required more rest with SLRs as she was corrected for slower pace exercises. No complaints of pain but just reported "feeling" groin pull.    Personal Factors and Comorbidities Other    Examination-Activity Limitations Sit;Locomotion Level;Other    Examination-Participation Restrictions Other    Stability/Clinical Decision Making Stable/Uncomplicated    Rehab Potential Excellent    PT Frequency 2x / week    PT Duration 4 weeks    PT Treatment/Interventions ADLs/Self Care Home Management;Cryotherapy;Electrical Stimulation;Ultrasound;Moist Heat;Iontophoresis 4mg /ml Dexamethasone;Therapeutic activities;Therapeutic exercise;Manual techniques;Patient/family education;Dry needling    PT Next Visit Plan Combo e'stim/US, STW/M, IFC at 80-150 Hz on 40% scan with HMP.  Pain-free adductor exercises.    PT Home Exercise Plan Access Code: [email or web address]  URL: [email or web address]  Date: 10/02/2021  Prepared by: 14/05/2021    Exercises  Supine Bridge - 1 x daily - 7 x weekly - 2 sets - 10 reps  Bent Knee Fallouts - 1 x daily - 7 x weekly - 2 sets - 10 reps  Supine Active Straight Leg Raise - 1 x daily - 7 x  weekly - 2 sets - 10 reps  Seated Hip Adduction Isometrics with Ball - 2 x daily - 7 x weekly - 2 sets - 10 reps - 5 seconds hold    Consulted  and Agree with Plan of Care Patient             Patient will benefit from skilled therapeutic intervention in order to improve the following deficits and impairments:  Pain  Visit Diagnosis: Pain in right thigh     Problem List Patient Active Problem List   Diagnosis Date Noted   History of prediabetes 06/24/2021   Vitamin D deficiency 11/03/2019   Decreased hearing of both ears 11/03/2019   Primary insomnia 11/03/2019   Groin pain, chronic, right 04/24/2019   Hyperlipidemia 08/28/2015   Thoracic aortic atherosclerosis (HCC) 04/15/2015   Osteoporosis 11/07/2014   Diverticulosis of colon    Essential hypertension    IBS (irritable bowel syndrome)     Marvell Fuller, PTA 11/25/2021, 12:19 PM  [facility] [address] [place], [place], [address] Phone: [phone]   Fax:  [phone]  Name: KIMBREE CASANAS MRN: [id number] Date of Birth: [birth year]

## 2021-12-01 ENCOUNTER — Ambulatory Visit: Payer: Medicare Other

## 2021-12-15 ENCOUNTER — Other Ambulatory Visit: Payer: Self-pay

## 2021-12-15 ENCOUNTER — Ambulatory Visit: Payer: Medicare Other | Attending: Surgery

## 2021-12-15 DIAGNOSIS — M79651 Pain in right thigh: Secondary | ICD-10-CM | POA: Insufficient documentation

## 2021-12-15 NOTE — Therapy (Signed)
[facility] [address] [place], [place], [address] Phone: [phone]   Fax:  [phone]  Physical Therapy Treatment  Patient Details  Name: Dorothy Pitts MRN: [id number] Date of Birth: 12-19-1940 Referring Provider (PT): Phylliss Blakes   Encounter Date: 12/15/2021   PT End of Session - 12/15/21 1157     Visit Number 6    Number of Visits 8    Date for PT Re-Evaluation 12/09/21    PT Start Time 1115    PT Stop Time 1155    PT Time Calculation (min) 40 min    Activity Tolerance Patient tolerated treatment well    Behavior During Therapy [facility] for tasks assessed/performed             Past Medical History:  Diagnosis Date   Acute diverticulitis 10/07/2013   With microperforation.   Anemia 10/08/2013   Bowel obstruction (HCC)    Cataract    Diverticulosis of colon (without mention of hemorrhage)    GERD (gastroesophageal reflux disease)    Hemorrhoids    HOH (hard of hearing)    Hyperlipidemia    Hypertension    IBS (irritable bowel syndrome)    Osteoporosis    PONV (postoperative nausea and vomiting)    Thoracic aortic atherosclerosis (HCC)    UTI (lower urinary tract infection) 10/07/2013   Vitamin D deficiency     Past Surgical History:  Procedure Laterality Date   ABDOMINAL HYSTERECTOMY     APPENDECTOMY     BILATERAL OOPHORECTOMY     BREAST LUMPECTOMY Left    CATARACT EXTRACTION     COLON RESECTION N/A 12/01/2013   Procedure: HAND ASSISTED LAPAROSCOPIC PARTIAL COLECTOMY;  Surgeon: Dalia Heading, MD;  Location: [facility];  Service: General;  Laterality: N/A;   EYE SURGERY     HEMORRHOID SURGERY     HERNIA REPAIR  09/04/2016   LAPAROSCOPIC VENTRAL INCISIONAL HERNIA REPAIR POSSIBLE OPEN (N/A)   INCISIONAL HERNIA REPAIR N/A 09/04/2016   Procedure: LAPAROSCOPIC VENTRAL INCISIONAL HERNIA REPAIR POSSIBLE OPEN;  Surgeon: Ovidio Kin, MD;  Location: [facility] OR;  Service: General;  Laterality: N/A;   INSERTION OF MESH  N/A 09/04/2016   Procedure: INSERTION OF MESH;  Surgeon: Ovidio Kin, MD;  Location: [facility];  Service: General;  Laterality: N/A;   PARTIAL HYSTERECTOMY     TONSILLECTOMY      There were no vitals filed for this visit.   Subjective Assessment - 12/15/21 1117     Subjective COVID-19 screen performed prior to patient entering clinic. Pt arrives for today's treatment session denying any pain, but states that she was very sore after her last treatment session with increased groin pain.    Pertinent History OP, OA, HTN, hernia repair.    How long can you sit comfortably? Unlimited but pain increases upon standing.    How long can you stand comfortably? Not a problem.    How long can you walk comfortably? Not a problem.    Diagnostic tests Per MD note:  "No inguinal or femoral hernia on exam or CT scan"    Patient Stated Goals Sit and then get up with no pain and return to golf.                               [facility] Adult PT Treatment/Exercise - 12/15/21 0001       Knee/Hip Exercises: Stretches   Other Knee/Hip Stretches LTR  x5 reps    Other Knee/Hip Stretches fall outs B 3 x 30 sec      Knee/Hip Exercises: Aerobic   Nustep Lvl 4 x 15 mins      Knee/Hip Exercises: Standing   Hip Abduction Both;20 reps;Knee straight    Hip Extension Both;20 reps;Knee straight    Forward Step Up Both;20 reps;Hand Hold: 2;Step Height: 6"      Knee/Hip Exercises: Supine   Hip Adduction Isometric Both   Ball squeezes x 2 mins   Bridges Both;Strengthening   25 reps   Other Supine Knee/Hip Exercises Clamshells 2 mins green tband    Other Supine Knee/Hip Exercises Marching x 1 mins BLE green theraband                          PT Long Term Goals - 09/30/21 1256       PT LONG TERM GOAL #1   Title Independent with a HEP.    Time 4    Period Weeks    Status New      PT LONG TERM GOAL #2   Title Sit 20 minutes and then walk without right groin pain.    Time 4     Period Weeks    Status New      PT LONG TERM GOAL #3   Title Golf without groin pain.    Time 4    Period Weeks    Status New                   Plan - 12/15/21 1119     Clinical Impression Statement Pt arrives for today's treatment session denying any pain.  Pt states that after last treatment session her left groin pain increased for a couple days. Pt requiring cues for pacing as she tends to rush through reps.  Pt also requiring min cues for proper technique and posture with standing exercises.  Pt reported tone relief with fall out stretch at the end of today's treatment session.    Personal Factors and Comorbidities Other    Examination-Activity Limitations Sit;Locomotion Level;Other    Examination-Participation Restrictions Other    Stability/Clinical Decision Making Stable/Uncomplicated    Rehab Potential Excellent    PT Frequency 2x / week    PT Duration 4 weeks    PT Treatment/Interventions ADLs/Self Care Home Management;Cryotherapy;Electrical Stimulation;Ultrasound;Moist Heat;Iontophoresis 4mg /ml Dexamethasone;Therapeutic activities;Therapeutic exercise;Manual techniques;Patient/family education;Dry needling    PT Next Visit Plan Combo e'stim/US, STW/M, IFC at 80-150 Hz on 40% scan with HMP.  Pain-free adductor exercises.    PT Home Exercise Plan Access Code: [email or web address]  URL: [email or web address]  Date: 10/02/2021  Prepared by: 14/05/2021    Exercises  Supine Bridge - 1 x daily - 7 x weekly - 2 sets - 10 reps  Bent Knee Fallouts - 1 x daily - 7 x weekly - 2 sets - 10 reps  Supine Active Straight Leg Raise - 1 x daily - 7 x weekly - 2 sets - 10 reps  Seated Hip Adduction Isometrics with Ball - 2 x daily - 7 x weekly - 2 sets - 10 reps - 5 seconds hold    Consulted and Agree with Plan of Care Patient             Patient will benefit from skilled therapeutic intervention in order to improve the following deficits and impairments:  Pain  Visit  Diagnosis: Pain in right thigh  Problem List Patient Active Problem List   Diagnosis Date Noted   History of prediabetes 06/24/2021   Vitamin D deficiency 11/03/2019   Decreased hearing of both ears 11/03/2019   Primary insomnia 11/03/2019   Groin pain, chronic, right 04/24/2019   Hyperlipidemia 08/28/2015   Thoracic aortic atherosclerosis (HCC) 04/15/2015   Osteoporosis 11/07/2014   Diverticulosis of colon    Essential hypertension    IBS (irritable bowel syndrome)     Newman Pies, PTA 12/15/2021, 12:04 PM  [facility] [address] [place], [place], [address] Phone: [phone]   Fax:  [phone]  Name: KEMPER HOCHMAN MRN: [id number] Date of Birth: August 13, 1941

## 2021-12-18 ENCOUNTER — Other Ambulatory Visit: Payer: Self-pay

## 2021-12-18 ENCOUNTER — Ambulatory Visit: Payer: Medicare Other | Admitting: *Deleted

## 2021-12-18 DIAGNOSIS — M79651 Pain in right thigh: Secondary | ICD-10-CM | POA: Diagnosis not present

## 2021-12-18 NOTE — Therapy (Signed)
[facility] [address] [place], [place], [address] Phone: [phone]   Fax:  [phone]  Physical Therapy Treatment  Patient Details  Name: Dorothy Pitts MRN: [id number] Date of Birth: 1941-05-26 Referring Provider (PT): Phylliss Blakes   Encounter Date: 12/18/2021   PT End of Session - 12/18/21 1628     Visit Number 7    Number of Visits 8    Date for PT Re-Evaluation 12/09/21    PT Start Time 1515    PT Stop Time 1605    PT Time Calculation (min) 50 min             Past Medical History:  Diagnosis Date   Acute diverticulitis 10/07/2013   With microperforation.   Anemia 10/08/2013   Bowel obstruction (HCC)    Cataract    Diverticulosis of colon (without mention of hemorrhage)    GERD (gastroesophageal reflux disease)    Hemorrhoids    HOH (hard of hearing)    Hyperlipidemia    Hypertension    IBS (irritable bowel syndrome)    Osteoporosis    PONV (postoperative nausea and vomiting)    Thoracic aortic atherosclerosis (HCC)    UTI (lower urinary tract infection) 10/07/2013   Vitamin D deficiency     Past Surgical History:  Procedure Laterality Date   ABDOMINAL HYSTERECTOMY     APPENDECTOMY     BILATERAL OOPHORECTOMY     BREAST LUMPECTOMY Left    CATARACT EXTRACTION     COLON RESECTION N/A 12/01/2013   Procedure: HAND ASSISTED LAPAROSCOPIC PARTIAL COLECTOMY;  Surgeon: Dalia Heading, MD;  Location: [facility];  Service: General;  Laterality: N/A;   EYE SURGERY     HEMORRHOID SURGERY     HERNIA REPAIR  09/04/2016   LAPAROSCOPIC VENTRAL INCISIONAL HERNIA REPAIR POSSIBLE OPEN (N/A)   INCISIONAL HERNIA REPAIR N/A 09/04/2016   Procedure: LAPAROSCOPIC VENTRAL INCISIONAL HERNIA REPAIR POSSIBLE OPEN;  Surgeon: Ovidio Kin, MD;  Location: [facility] OR;  Service: General;  Laterality: N/A;   INSERTION OF MESH N/A 09/04/2016   Procedure: INSERTION OF MESH;  Surgeon: Ovidio Kin, MD;  Location: [facility] OR;  Service: General;   Laterality: N/A;   PARTIAL HYSTERECTOMY     TONSILLECTOMY      There were no vitals filed for this visit.   Subjective Assessment - 12/18/21 1526     Subjective COVID-19 screen performed prior to patient entering clinic. Just played 9 holes. Did okay after last Rx.    Pertinent History OP, OA, HTN, hernia repair.    How long can you sit comfortably? Unlimited but pain increases upon standing.    How long can you stand comfortably? Not a problem.    How long can you walk comfortably? Not a problem.    Diagnostic tests Per MD note:  "No inguinal or femoral hernia on exam or CT scan"    Patient Stated Goals Sit and then get up with no pain and return to golf.    Currently in Pain? Yes    Pain Score 2     Pain Location Hip    Pain Orientation Right    Pain Descriptors / Indicators Sore                               [facility] Adult PT Treatment/Exercise - 12/18/21 0001       Knee/Hip Exercises: Stretches   Other Knee/Hip Stretches fall outs  B 3 x 10      Knee/Hip Exercises: Aerobic   Nustep Lvl 4 x 15 mins      Knee/Hip Exercises: Standing   Forward Lunges Left;3 sets;10 reps   8 in box RT hip flexor stretch   Rocker Board 5 minutes   DF, PF, calf stretch and balance     Knee/Hip Exercises: Supine   Hip Adduction Isometric Both   Ball squeezes x 2 mins   Bridges Both;Strengthening   25 reps   Bridges with [employer] Strengthening;Both;20 reps    Bridges with Clamshell 2 sets;Strengthening;10 reps   red band   Straight Leg Raises Both;20 reps    Other Supine Knee/Hip Exercises Clamshells2x10 hold 5 secs                          PT Long Term Goals - 12/18/21 1630       PT LONG TERM GOAL #1   Title Independent with a HEP.    Time 4    Period Weeks    Status Achieved      PT LONG TERM GOAL #2   Title Sit 20 minutes and then walk without right groin pain.    Time 4    Period Weeks    Status On-going      PT LONG TERM GOAL #3    Title Golf without groin pain.    Time 4    Period Weeks    Status Achieved                   Plan - 12/18/21 1532     Clinical Impression Statement Pt arrived today doing better overall with minimal hip pain RT side.She was able to continue with Hip mobility and strengthening exs with mainly mm fatigue and no increased hip pain. No pain with golf today, but continues to have pain after sitting a while and gets up to walk and has tightness and pain.    Personal Factors and Comorbidities Other    Examination-Activity Limitations Sit;Locomotion Level;Other    Examination-Participation Restrictions Other             Patient will benefit from skilled therapeutic intervention in order to improve the following deficits and impairments:     Visit Diagnosis: Pain in right thigh     Problem List Patient Active Problem List   Diagnosis Date Noted   History of prediabetes 06/24/2021   Vitamin D deficiency 11/03/2019   Decreased hearing of both ears 11/03/2019   Primary insomnia 11/03/2019   Groin pain, chronic, right 04/24/2019   Hyperlipidemia 08/28/2015   Thoracic aortic atherosclerosis (HCC) 04/15/2015   Osteoporosis 11/07/2014   Diverticulosis of colon    Essential hypertension    IBS (irritable bowel syndrome)     Dmitry Macomber,CHRIS, PTA 12/18/2021, 4:32 PM  [facility] [address] [place], [place], [address] Phone: [phone]   Fax:  [phone]  Name: Dorothy Pitts MRN: [id number] Date of Birth: December 19, 1940

## 2022-01-07 ENCOUNTER — Ambulatory Visit (INDEPENDENT_AMBULATORY_CARE_PROVIDER_SITE_OTHER): Payer: Medicare Other

## 2022-01-07 VITALS — Wt 116.0 lb

## 2022-01-07 DIAGNOSIS — Z0000 Encounter for general adult medical examination without abnormal findings: Secondary | ICD-10-CM

## 2022-01-07 NOTE — Progress Notes (Signed)
? ?Subjective:  ? Dorothy Pitts is a 81 y.o. female who presents for Medicare Annual (Subsequent) preventive examination. ? ?Virtual Visit via Telephone Note ? ?I connected with  Dorothy Pitts on 01/07/22 at  3:30 PM EDT by telephone and verified that I am speaking with the correct person using two identifiers. ? ?Location: ?Patient: Home ?Provider: [facility] ?Persons participating in the virtual visit: patient/Nurse Health Advisor ?  ?I discussed the limitations, risks, security and privacy concerns of performing an evaluation and management service by telephone and the availability of in person appointments. The patient expressed understanding and agreed to proceed. ? ?Interactive audio and video telecommunications were attempted between this nurse and patient, however failed, due to patient having technical difficulties OR patient did not have access to video capability.  We continued and completed visit with audio only. ? ?Some vital signs may be absent or patient reported.  ? ?Dorothy Saltzman Hurman Horn, LPN  ? ?Review of Systems    ? ?Cardiac Risk Factors include: advanced age (>53men, >71 women);hypertension;dyslipidemia;Other (see comment), Risk factor comments: atherosclerosis ? ?   ?Objective:  ?  ?Today's Vitals  ? 01/07/22 1532  ?Weight: 116 lb (52.6 kg)  ?PainSc: 0-No pain  ? ?Body mass index is 21.92 kg/m?. ? ?Advanced Directives 01/07/2022 09/30/2021 01/06/2021 09/04/2019 08/23/2018 09/12/2016 09/04/2016  ?Does Patient Have a Medical Advance Directive? No Yes Yes No No No No  ?Type of Advance Directive - - Healthcare Power of [place];Living will - - - -  ?Does patient want to make changes to medical advance directive? - - No - Patient declined - - - -  ?Copy of Healthcare Power of Attorney in Chart? - - No - copy requested - - - -  ?Would patient like information on creating a medical advance directive? No - Patient declined - - No - Patient declined Yes (MAU/Ambulatory/Procedural Areas - Information given) No -  patient declined information No - patient declined information  ?Pre-existing out of facility DNR order (yellow form or pink MOST form) - - - - - - -  ? ? ?Current Medications (verified) ?Outpatient Encounter Medications as of 01/07/2022  ?Medication Sig  ? Ascorbic Acid (VITAMIN C) 1000 MG tablet Take 1,000 mg by mouth daily. Reported on 11/21/2015  ? Calcium Carbonate-Vitamin D (CALTRATE 600+D) 600-400 MG-UNIT tablet Take 1 tablet by mouth daily.  ? Cholecalciferol (VITAMIN D3) 5000 units CAPS Take 1 capsule by mouth every Monday, Wednesday, and Friday.  ? denosumab (PROLIA) 60 MG/ML SOSY injection Inject 60 mg into the skin every 6 (six) months.  ? enalapril (VASOTEC) 5 MG tablet Take 1 tablet (5 mg total) by mouth daily.  ? famotidine-calcium carbonate-magnesium hydroxide (PEPCID COMPLETE) 10-800-165 MG chewable tablet Chew 1 tablet by mouth daily.  ? loratadine (CLARITIN) 10 MG tablet Take 10 mg by mouth daily.  ? nystatin cream (MYCOSTATIN) Apply 1 application topically 2 (two) times daily.  ? Polyethylene Glycol 3350 (MIRALAX PO) Take 1 Dose by mouth daily.  ? Probiotic Product (ALIGN PO) Take 1 tablet by mouth daily.  ? spironolactone (ALDACTONE) 50 MG tablet TAKE 1 TABLET BY MOUTH EVERY DAY  ? triamcinolone cream (KENALOG) 0.1 % APPLY TO AFFECTED AREA TWICE A DAY  ? vitamin B-12 (CYANOCOBALAMIN) 1000 MCG tablet Take 1,000 mcg by mouth daily.  ? vitamin k 100 MCG tablet Take 100 mcg by mouth daily.  ? [DISCONTINUED] aspirin EC 81 MG tablet Take 81 mg by mouth daily. Swallow whole. (Patient not taking: Reported  on 09/30/2021)  ? [DISCONTINUED] azithromycin (ZITHROMAX Z-PAK) 250 MG tablet As directed (Patient not taking: Reported on 01/07/2022)  ? [DISCONTINUED] benzonatate (TESSALON PERLES) 100 MG capsule Take 1 capsule (100 mg total) by mouth 3 (three) times daily as needed for cough. (Patient not taking: Reported on 01/07/2022)  ? [DISCONTINUED] enalapril (VASOTEC) 5 MG tablet Take 1 tablet (5 mg total) by mouth  daily.  ? [DISCONTINUED] lidocaine (LIDODERM) 5 % Place 1 patch onto the skin daily. Remove & Discard patch within 12 hours or as directed by MD (Patient not taking: Reported on 01/07/2022)  ? [DISCONTINUED] rosuvastatin (CRESTOR) 5 MG tablet Take 1 tablet (5 mg total) by mouth daily.  ? [DISCONTINUED] rosuvastatin (CRESTOR) 5 MG tablet Take 1 tablet (5 mg total) by mouth 3 (three) times a week. (Patient not taking: Reported on 09/30/2021)  ? [DISCONTINUED] spironolactone (ALDACTONE) 50 MG tablet Take 1 tablet (50 mg total) by mouth daily.  ? [DISCONTINUED] zolpidem (AMBIEN) 5 MG tablet Take 1 tablet (5 mg total) by mouth at bedtime.  ? ?No facility-administered encounter medications on file as of 01/07/2022.  ? ? ?Allergies (verified) ?Actonel [risedronate sodium], Docosahexaenoic acid (dha), Fosamax [alendronate sodium], Crestor [rosuvastatin calcium], Nsaids, Other, Augmentin [amoxicillin-pot clavulanate], Cephalexin, Cephalosporins, Ciprofloxacin, and Lovaza [omega-3-acid ethyl esters]  ? ?History: ?Past Medical History:  ?Diagnosis Date  ? Acute diverticulitis 10/07/2013  ? With microperforation.  ? Anemia 10/08/2013  ? Bowel obstruction (HCC)   ? Cataract   ? Diverticulosis of colon (without mention of hemorrhage)   ? GERD (gastroesophageal reflux disease)   ? Hemorrhoids   ? HOH (hard of hearing)   ? Hyperlipidemia   ? Hypertension   ? IBS (irritable bowel syndrome)   ? Osteoporosis   ? PONV (postoperative nausea and vomiting)   ? Thoracic aortic atherosclerosis (HCC)   ? UTI (lower urinary tract infection) 10/07/2013  ? Vitamin D deficiency   ? ?Past Surgical History:  ?Procedure Laterality Date  ? ABDOMINAL HYSTERECTOMY    ? APPENDECTOMY    ? BILATERAL OOPHORECTOMY    ? BREAST LUMPECTOMY Left   ? CATARACT EXTRACTION    ? COLON RESECTION N/A 12/01/2013  ? Procedure: HAND ASSISTED LAPAROSCOPIC PARTIAL COLECTOMY;  Surgeon: Dalia Heading, MD;  Location: [facility];  Service: General;  Laterality: N/A;  ? EYE SURGERY     ? HEMORRHOID SURGERY    ? HERNIA REPAIR  09/04/2016  ? LAPAROSCOPIC VENTRAL INCISIONAL HERNIA REPAIR POSSIBLE OPEN (N/A)  ? INCISIONAL HERNIA REPAIR N/A 09/04/2016  ? Procedure: LAPAROSCOPIC VENTRAL INCISIONAL HERNIA REPAIR POSSIBLE OPEN;  Surgeon: Ovidio Kin, MD;  Location: [facility] OR;  Service: General;  Laterality: N/A;  ? INSERTION OF MESH N/A 09/04/2016  ? Procedure: INSERTION OF MESH;  Surgeon: Ovidio Kin, MD;  Location: [facility] OR;  Service: General;  Laterality: N/A;  ? PARTIAL HYSTERECTOMY    ? TONSILLECTOMY    ? ?Family History  ?Problem Relation Age of Onset  ? Hypertension Mother   ? Heart attack Mother 37  ? Parkinson's disease Father   ? Heart disease Brother 66  ?     bypass  ? Obesity Brother   ? Asthma Son   ? Other Son   ?     Gout  ? Colon cancer Neg Hx   ? ?Social History  ? ?Socioeconomic History  ? Marital status: Widowed  ?  Spouse name: Not on file  ? Number of children: 3  ? Years of education: 24  ?  Highest education level: Some college, no degree  ?Occupational History  ? Occupation: retired  ?  Employer: RETIRED  ?Tobacco Use  ? Smoking status: Former  ?  Packs/day: 1.00  ?  Years: 35.00  ?  Pack years: 35.00  ?  Types: Cigarettes  ?  Quit date: 10/26/2002  ?  Years since quitting: 19.2  ? Smokeless tobacco: Never  ?Vaping Use  ? Vaping Use: Never used  ?Substance and Sexual Activity  ? Alcohol use: Yes  ?  Alcohol/week: 4.0 standard drinks  ?  Types: 4 Glasses of wine per week  ?  Comment: wine  ? Drug use: No  ? Sexual activity: Not Currently  ?Other Topics Concern  ? Not on file  ?Social History Narrative  ? Son and grandson live with her for now - 2023  ? ?Social Determinants of Health  ? ?Financial Resource Strain: Low Risk   ? Difficulty of Paying Living Expenses: Not hard at all  ?Food Insecurity: No Food Insecurity  ? Worried About Programme researcher, broadcasting/film/videounning Out of Food in the Last Year: Never true  ? Ran Out of Food in the Last Year: Never true  ?Transportation Needs: No Transportation Needs  ? Lack of  Transportation (Medical): No  ? Lack of Transportation (Non-Medical): No  ?Physical Activity: Sufficiently Active  ? Days of Exercise per Week: 5 days  ? Minutes of Exercise per Session: 30 min  ?Stres

## 2022-01-07 NOTE — Patient Instructions (Signed)
[name] , ?Thank you for taking time to come for your Medicare Wellness Visit. I appreciate your ongoing commitment to your health goals. Please review the following plan we discussed and let me know if I can assist you in the future.  ? ?Screening recommendations/referrals: ?Colonoscopy: Done 09/09/2012 - ask if they would like you to repeat this ?Mammogram: Done 01/20/2021 - Repeat annually *appointment in May ?Bone Density: Done 07/22/2020 - Repeat every 2 years  ?Recommended yearly ophthalmology/optometry visit for glaucoma screening and checkup ?Recommended yearly dental visit for hygiene and checkup ? ?Vaccinations: ?Influenza vaccine: Declined -recommend annually ?Pneumococcal vaccine: Declined - allergic ?Tdap vaccine: Done 12/30/2020 - Repeat in 10 years ?Shingles vaccine: Declined - zostavax done 12/24/2009   ?Covid-19: Declined ? ?Advanced directives: Please bring a copy of your health care power of attorney and living will to the office to be added to your chart at your convenience.  ? ?Conditions/risks identified: Aim for 30 minutes of exercise or brisk walking, 6-8 glasses of water, and 5 servings of fruits and vegetables each day.  ? ?Next appointment: Follow up in one year for your annual wellness visit  ? ? ?Preventive Care 81 Years and Older, Female ?Preventive care refers to lifestyle choices and visits with your health care provider that can promote health and wellness. ?What does preventive care include? ?A yearly physical exam. This is also called an annual well check. ?Dental exams once or twice a year. ?Routine eye exams. Ask your health care provider how often you should have your eyes checked. ?Personal lifestyle choices, including: ?Daily care of your teeth and gums. ?Regular physical activity. ?Eating a healthy diet. ?Avoiding tobacco and drug use. ?Limiting alcohol use. ?Practicing safe sex. ?Taking low-dose aspirin every day. ?Taking vitamin and mineral supplements as recommended by your  health care provider. ?What happens during an annual well check? ?The services and screenings done by your health care provider during your annual well check will depend on your age, overall health, lifestyle risk factors, and family history of disease. ?Counseling  ?Your health care provider may ask you questions about your: ?Alcohol use. ?Tobacco use. ?Drug use. ?Emotional well-being. ?Home and relationship well-being. ?Sexual activity. ?Eating habits. ?History of falls. ?Memory and ability to understand (cognition). ?Work and work Astronomer. ?Reproductive health. ?Screening  ?You may have the following tests or measurements: ?Height, weight, and BMI. ?Blood pressure. ?Lipid and cholesterol levels. These may be checked every 5 years, or more frequently if you are over 80 years old. ?Skin check. ?Lung cancer screening. You may have this screening every year starting at age 19 if you have a 30-pack-year history of smoking and currently smoke or have quit within the past 15 years. ?Fecal occult blood test (FOBT) of the stool. You may have this test every year starting at age 82. ?Flexible sigmoidoscopy or colonoscopy. You may have a sigmoidoscopy every 5 years or a colonoscopy every 10 years starting at age 20. ?Hepatitis C blood test. ?Hepatitis B blood test. ?Sexually transmitted disease (STD) testing. ?Diabetes screening. This is done by checking your blood sugar (glucose) after you have not eaten for a while (fasting). You may have this done every 1-3 years. ?Bone density scan. This is done to screen for osteoporosis. You may have this done starting at age 81. ?Mammogram. This may be done every 1-2 years. Talk to your health care provider about how often you should have regular mammograms. ?Talk with your health care provider about your test results, treatment options,  and if necessary, the need for more tests. ?Vaccines  ?Your health care provider may recommend certain vaccines, such as: ?Influenza vaccine.  This is recommended every year. ?Tetanus, diphtheria, and acellular pertussis (Tdap, Td) vaccine. You may need a Td booster every 10 years. ?Zoster vaccine. You may need this after age 81. ?Pneumococcal 13-valent conjugate (PCV13) vaccine. One dose is recommended after age 81. ?Pneumococcal polysaccharide (PPSV23) vaccine. One dose is recommended after age 81. ?Talk to your health care provider about which screenings and vaccines you need and how often you need them. ?This information is not intended to replace advice given to you by your health care provider. Make sure you discuss any questions you have with your health care provider. ?Document Released: 11/08/2015 Document Revised: 07/01/2016 Document Reviewed: 08/13/2015 ?Elsevier Interactive Patient Education ? 2017 [employer] ? ?Fall Prevention in the Home ?Falls can cause injuries. They can happen to people of all ages. There are many things you can do to make your home safe and to help prevent falls. ?What can I do on the outside of my home? ?Regularly fix the edges of walkways and driveways and fix any cracks. ?Remove anything that might make you trip as you walk through a door, such as a raised step or threshold. ?Trim any bushes or trees on the path to your home. ?Use bright outdoor lighting. ?Clear any walking paths of anything that might make someone trip, such as rocks or tools. ?Regularly check to see if handrails are loose or broken. Make sure that both sides of any steps have handrails. ?Any raised decks and porches should have guardrails on the edges. ?Have any leaves, snow, or ice cleared regularly. ?Use sand or salt on walking paths during winter. ?Clean up any spills in your garage right away. This includes oil or grease spills. ?What can I do in the bathroom? ?Use night lights. ?Install grab bars by the toilet and in the tub and shower. Do not use towel bars as grab bars. ?Use non-skid mats or decals in the tub or shower. ?If you need to sit  down in the shower, use a plastic, non-slip stool. ?Keep the floor dry. Clean up any water that spills on the floor as soon as it happens. ?Remove soap buildup in the tub or shower regularly. ?Attach bath mats securely with double-sided non-slip rug tape. ?Do not have throw rugs and other things on the floor that can make you trip. ?What can I do in the bedroom? ?Use night lights. ?Make sure that you have a light by your bed that is easy to reach. ?Do not use any sheets or blankets that are too big for your bed. They should not hang down onto the floor. ?Have a firm chair that has side arms. You can use this for support while you get dressed. ?Do not have throw rugs and other things on the floor that can make you trip. ?What can I do in the kitchen? ?Clean up any spills right away. ?Avoid walking on wet floors. ?Keep items that you use a lot in easy-to-reach places. ?If you need to reach something above you, use a strong step stool that has a grab bar. ?Keep electrical cords out of the way. ?Do not use floor polish or wax that makes floors slippery. If you must use wax, use non-skid floor wax. ?Do not have throw rugs and other things on the floor that can make you trip. ?What can I do with my stairs? ?Do not leave  any items on the stairs. ?Make sure that there are handrails on both sides of the stairs and use them. Fix handrails that are broken or loose. Make sure that handrails are as long as the stairways. ?Check any carpeting to make sure that it is firmly attached to the stairs. Fix any carpet that is loose or worn. ?Avoid having throw rugs at the top or bottom of the stairs. If you do have throw rugs, attach them to the floor with carpet tape. ?Make sure that you have a light switch at the top of the stairs and the bottom of the stairs. If you do not have them, ask someone to add them for you. ?What else can I do to help prevent falls? ?Wear shoes that: ?Do not have high heels. ?Have rubber bottoms. ?Are  comfortable and fit you well. ?Are closed at the toe. Do not wear sandals. ?If you use a stepladder: ?Make sure that it is fully opened. Do not climb a closed stepladder. ?Make sure that both sides of the st

## 2022-01-21 ENCOUNTER — Other Ambulatory Visit: Payer: Self-pay | Admitting: Family Medicine

## 2022-01-21 DIAGNOSIS — I10 Essential (primary) hypertension: Secondary | ICD-10-CM

## 2022-02-16 ENCOUNTER — Other Ambulatory Visit: Payer: Self-pay | Admitting: Family Medicine

## 2022-02-16 ENCOUNTER — Telehealth: Payer: Self-pay | Admitting: Family Medicine

## 2022-02-16 DIAGNOSIS — I10 Essential (primary) hypertension: Secondary | ICD-10-CM

## 2022-02-16 NOTE — Telephone Encounter (Signed)
Refill should have been denied for today - called [facility] and canceled the RX as the patient needs an office visit before further refills are given.  ?

## 2022-02-16 NOTE — Telephone Encounter (Signed)
Patient calling because she needs to set up appt for prolia shot. Please call back.  ?

## 2022-02-16 NOTE — Telephone Encounter (Signed)
Appt made for 4/25  ?

## 2022-02-16 NOTE — Telephone Encounter (Signed)
ntbs  - 30 days given 01/22/22 ?

## 2022-02-17 ENCOUNTER — Ambulatory Visit (INDEPENDENT_AMBULATORY_CARE_PROVIDER_SITE_OTHER): Payer: Medicare Other | Admitting: Family Medicine

## 2022-02-17 ENCOUNTER — Encounter: Payer: Self-pay | Admitting: Family Medicine

## 2022-02-17 VITALS — BP 124/64 | HR 78 | Temp 95.1°F | Ht 61.0 in | Wt 119.8 lb

## 2022-02-17 DIAGNOSIS — R7303 Prediabetes: Secondary | ICD-10-CM

## 2022-02-17 DIAGNOSIS — I10 Essential (primary) hypertension: Secondary | ICD-10-CM

## 2022-02-17 DIAGNOSIS — M810 Age-related osteoporosis without current pathological fracture: Secondary | ICD-10-CM

## 2022-02-17 DIAGNOSIS — I700 Atherosclerosis of aorta: Secondary | ICD-10-CM | POA: Diagnosis not present

## 2022-02-17 DIAGNOSIS — E782 Mixed hyperlipidemia: Secondary | ICD-10-CM

## 2022-02-17 DIAGNOSIS — K581 Irritable bowel syndrome with constipation: Secondary | ICD-10-CM

## 2022-02-17 LAB — BAYER DCA HB A1C WAIVED: HB A1C (BAYER DCA - WAIVED): 5.7 % — ABNORMAL HIGH (ref 4.8–5.6)

## 2022-02-17 MED ORDER — SPIRONOLACTONE 50 MG PO TABS: 50.0000 mg | ORAL_TABLET | Freq: Every day | ORAL | 1 refills | Status: DC

## 2022-02-17 MED ORDER — ROSUVASTATIN CALCIUM 5 MG PO TABS: 5.0000 mg | ORAL_TABLET | ORAL | 1 refills | Status: DC

## 2022-02-17 NOTE — Progress Notes (Signed)
? ?Assessment & Plan:  ?1. Essential hypertension ?Well controlled on current regimen.  ?- spironolactone (ALDACTONE) 50 MG tablet; Take 1 tablet (50 mg total) by mouth daily.  Dispense: 90 tablet; Refill: 1 ?- CBC with Differential/Platelet ?- CMP14+EGFR ?- Lipid panel ? ?2. Mixed hyperlipidemia ?Labs to assess since patient stopped rosuvastatin. ?- rosuvastatin (CRESTOR) 5 MG tablet; Take 1 tablet (5 mg total) by mouth once a week.  Dispense: 12 tablet; Refill: 1 ?- CBC with Differential/Platelet ?- CMP14+EGFR ?- Lipid panel ? ?3. Thoracic aortic atherosclerosis ([place]) ?Encouraged to continue rosuvastatin even if it is just once weekly; patient agreeable. ?- rosuvastatin (CRESTOR) 5 MG tablet; Take 1 tablet (5 mg total) by mouth once a week.  Dispense: 12 tablet; Refill: 1 ?- CBC with Differential/Platelet ?- CMP14+EGFR ?- Lipid panel ? ?4. Pre-diabetes ?- Bayer DCA Hb A1c Waived ? ?5. Irritable bowel syndrome with constipation ?Well controlled on current regimen.  ?- CMP14+EGFR ? ?6. Age-related osteoporosis without current pathological fracture ?Continue calcium, vitamin D, and Prolia. Will re-check DEXA later in the year when she is due.  ? ? ?Return in about 6 months (around 08/19/2022) for annual physical. ? ?Hendricks Limes, MSN, APRN, FNP-C ?[place] ? ?Subjective:  ? ? Patient ID: Dorothy Pitts, female    DOB: 1941-09-22, 81 y.o.   MRN: [id number] ? ?Patient Care Team: ?Loman Brooklyn, FNP as PCP - General (Family Medicine) ?Pyrtle, Lajuan Lines, MD as Consulting Physician (Gastroenterology) ?Vania Rea, MD as Consulting Physician (Obstetrics and Gynecology) ?Clovis Riley, MD as Consulting Physician (General Surgery)  ? ?Chief Complaint:  ?Chief Complaint  ?Patient presents with  ? Medical Management of Chronic Issues  ? ? ?HPI: ?Dorothy Pitts is a 81 y.o. female presenting on 02/17/2022 for Medical Management of Chronic Issues ? ?Hypertension: takes medication as prescribed.   ?  ?Thoracic Aortic Atherosclerosis/Hyperlipidemia: patient was previously taking rosuvastatin three times weekly sometimes. She was worried if she took it consistently she would develop the joint/muscle pain that people tell her about. We discussed this and decided together she would take the medication three times weekly every week and if she developed pain we would back off. She reports today she quit taking the rosuvastatin simply because she wanted to, not because of any side effects.  ?  ?IBS: controlled with Miralax daily and Colace as needed. ?  ?Osteoporosis: patient gets Prolia injections every 6 months. Last injection 08/28/2021. Last DEXA 07/22/2020. Taking calcium and vitamin D daily.  ?  ?Vitamin D Deficiency: taking vitamin D supplement. Last vitamin D level normal on 12/30/2020. She has been normal for several years now.  ?  ?Pre-diabetes: last A1c 5.7 on 06/24/2021.  ? ?New complaints: ?None ? ? ?Social history: ? ?Relevant past medical, surgical, family and social history reviewed and updated as indicated. Interim medical history since our last visit reviewed. ? ?Allergies and medications reviewed and updated. ? ?DATA REVIEWED: CHART IN EPIC ? ?ROS: Negative unless specifically indicated above in HPI.  ? ? ?Current Outpatient Medications:  ?  Ascorbic Acid (VITAMIN C) 1000 MG tablet, Take 1,000 mg by mouth daily. Reported on 11/21/2015, Disp: , Rfl:  ?  Calcium Carbonate-Vitamin D (CALTRATE 600+D) 600-400 MG-UNIT tablet, Take 1 tablet by mouth daily., Disp: 100 tablet, Rfl:  ?  Cholecalciferol (VITAMIN D3) 5000 units CAPS, Take 1 capsule by mouth every Monday, Wednesday, and Friday., Disp: , Rfl:  ?  denosumab (PROLIA) 60 MG/ML SOSY injection, Inject 60 mg  into the skin every 6 (six) months., Disp: , Rfl:  ?  enalapril (VASOTEC) 5 MG tablet, Take 1 tablet (5 mg total) by mouth daily., Disp: 90 tablet, Rfl: 3 ?  famotidine-calcium carbonate-magnesium hydroxide (PEPCID COMPLETE) 10-800-165 MG chewable  tablet, Chew 1 tablet by mouth daily., Disp: , Rfl:  ?  loratadine (CLARITIN) 10 MG tablet, Take 10 mg by mouth daily., Disp: , Rfl:  ?  nystatin cream (MYCOSTATIN), Apply 1 application topically 2 (two) times daily., Disp: , Rfl:  ?  Polyethylene Glycol 3350 (MIRALAX PO), Take 1 Dose by mouth daily., Disp: , Rfl:  ?  Probiotic Product (ALIGN PO), Take 1 tablet by mouth daily., Disp: , Rfl:  ?  rosuvastatin (CRESTOR) 5 MG tablet, Take 1 tablet (5 mg total) by mouth once a week., Disp: 12 tablet, Rfl: 1 ?  triamcinolone cream (KENALOG) 0.1 %, APPLY TO AFFECTED AREA TWICE A DAY, Disp: 15 g, Rfl: 3 ?  vitamin B-12 (CYANOCOBALAMIN) 1000 MCG tablet, Take 1,000 mcg by mouth daily., Disp: , Rfl:  ?  vitamin k 100 MCG tablet, Take 100 mcg by mouth daily., Disp: , Rfl:  ?  spironolactone (ALDACTONE) 50 MG tablet, Take 1 tablet (50 mg total) by mouth daily., Disp: 90 tablet, Rfl: 1  ? ?Allergies  ?Allergen Reactions  ? Actonel [Risedronate Sodium] Other (See Comments)  ?  Reaction=Heart burn ?Atelvia - caused diarrhea and vomiting  ? Docosahexaenoic Acid (Dha) Other (See Comments)  ?  Bruising  ? Fosamax [Alendronate Sodium] Other (See Comments)  ?  Reaction=Heart burn  ? Crestor [Rosuvastatin Calcium] Other (See Comments)  ?  Severe myalgias ?  ? Nsaids Swelling  ?  Reaction=Swelling of face  ? Other   ?  General anesthesia-nausea  ? Augmentin [Amoxicillin-Pot Clavulanate]   ?  Pt thinks she had nausea with this med when asked 04/27/17  ? Cephalexin Itching and Rash  ? Cephalosporins Rash  ? Ciprofloxacin Itching and Rash  ? Lovaza [Omega-3-Acid Ethyl Esters] Other (See Comments)  ?  Reaction=skin bruise   ? ?Past Medical History:  ?Diagnosis Date  ? Acute diverticulitis 10/07/2013  ? With microperforation.  ? Anemia 10/08/2013  ? Bowel obstruction ([place])   ? Cataract   ? Diverticulosis of colon (without mention of hemorrhage)   ? GERD (gastroesophageal reflux disease)   ? Hemorrhoids   ? HOH (hard of hearing)   ?  Hyperlipidemia   ? Hypertension   ? IBS (irritable bowel syndrome)   ? Osteoporosis   ? PONV (postoperative nausea and vomiting)   ? Thoracic aortic atherosclerosis ([place])   ? UTI (lower urinary tract infection) 10/07/2013  ? Vitamin D deficiency   ?  ?Past Surgical History:  ?Procedure Laterality Date  ? ABDOMINAL HYSTERECTOMY    ? APPENDECTOMY    ? BILATERAL OOPHORECTOMY    ? BREAST LUMPECTOMY Left   ? CATARACT EXTRACTION    ? COLON RESECTION N/A 12/01/2013  ? Procedure: HAND ASSISTED LAPAROSCOPIC PARTIAL COLECTOMY;  Surgeon: Jamesetta So, MD;  Location: [facility];  Service: General;  Laterality: N/A;  ? EYE SURGERY    ? HEMORRHOID SURGERY    ? HERNIA REPAIR  09/04/2016  ? LAPAROSCOPIC VENTRAL INCISIONAL HERNIA REPAIR POSSIBLE OPEN (N/A)  ? INCISIONAL HERNIA REPAIR N/A 09/04/2016  ? Procedure: LAPAROSCOPIC VENTRAL INCISIONAL HERNIA REPAIR POSSIBLE OPEN;  Surgeon: Alphonsa Overall, MD;  Location: [facility];  Service: General;  Laterality: N/A;  ? INSERTION OF MESH N/A 09/04/2016  ? Procedure:  INSERTION OF MESH;  Surgeon: Alphonsa Overall, MD;  Location: [facility];  Service: General;  Laterality: N/A;  ? PARTIAL HYSTERECTOMY    ? TONSILLECTOMY    ?  ?Social History  ? ?Socioeconomic History  ? Marital status: Widowed  ?  Spouse name: Not on file  ? Number of children: 3  ? Years of education: 93  ? Highest education level: Some college, no degree  ?Occupational History  ? Occupation: retired  ?  Employer: RETIRED  ?Tobacco Use  ? Smoking status: Former  ?  Packs/day: 1.00  ?  Years: 35.00  ?  Pack years: 35.00  ?  Types: Cigarettes  ?  Quit date: 10/26/2002  ?  Years since quitting: 19.3  ? Smokeless tobacco: Never  ?Vaping Use  ? Vaping Use: Never used  ?Substance and Sexual Activity  ? Alcohol use: Yes  ?  Alcohol/week: 4.0 standard drinks  ?  Types: 4 Glasses of wine per week  ?  Comment: wine  ? Drug use: No  ? Sexual activity: Not Currently  ?Other Topics Concern  ? Not on file  ?Social History Narrative  ? Son and grandson live  with her for now - 2023  ? ?Social Determinants of Health  ? ?Financial Resource Strain: Low Risk   ? Difficulty of Paying Living Expenses: Not hard at all  ?Food Insecurity: No Food Insecurity  ? Worried About Running

## 2022-02-18 LAB — CBC WITH DIFFERENTIAL/PLATELET
Basophils Absolute: 0.1 10*3/uL (ref 0.0–0.2)
Basos: 1 %
EOS (ABSOLUTE): 0.2 10*3/uL (ref 0.0–0.4)
Eos: 5 %
Hematocrit: 41.5 % (ref 34.0–46.6)
Hemoglobin: 13.7 g/dL (ref 11.1–15.9)
Immature Grans (Abs): 0 10*3/uL (ref 0.0–0.1)
Immature Granulocytes: 0 %
Lymphocytes Absolute: 1.8 10*3/uL (ref 0.7–3.1)
Lymphs: 38 %
MCH: 31.4 pg (ref 26.6–33.0)
MCHC: 33 g/dL (ref 31.5–35.7)
MCV: 95 fL (ref 79–97)
Monocytes Absolute: 0.5 10*3/uL (ref 0.1–0.9)
Monocytes: 10 %
Neutrophils Absolute: 2.1 10*3/uL (ref 1.4–7.0)
Neutrophils: 46 %
Platelets: 252 10*3/uL (ref 150–450)
RBC: 4.36 x10E6/uL (ref 3.77–5.28)
RDW: 11.7 % (ref 11.7–15.4)
WBC: 4.6 10*3/uL (ref 3.4–10.8)

## 2022-02-18 LAB — LIPID PANEL
Chol/HDL Ratio: 3.7 ratio (ref 0.0–4.4)
Cholesterol, Total: 281 mg/dL — ABNORMAL HIGH (ref 100–199)
HDL: 75 mg/dL (ref >39)
LDL Chol Calc (NIH): 183 mg/dL — ABNORMAL HIGH (ref 0–99)
Triglycerides: 132 mg/dL (ref 0–149)
VLDL Cholesterol Cal: 23 mg/dL (ref 5–40)

## 2022-02-18 LAB — CMP14+EGFR
ALT: 14 IU/L (ref 0–32)
AST: 15 IU/L (ref 0–40)
Albumin/Globulin Ratio: 2.3 — ABNORMAL HIGH (ref 1.2–2.2)
Albumin: 4.4 g/dL (ref 3.7–4.7)
Alkaline Phosphatase: 43 IU/L — ABNORMAL LOW (ref 44–121)
BUN/Creatinine Ratio: 22 (ref 12–28)
BUN: 15 mg/dL (ref 8–27)
Bilirubin Total: 0.2 mg/dL (ref 0.0–1.2)
CO2: 26 mmol/L (ref 20–29)
Calcium: 10.3 mg/dL (ref 8.7–10.3)
Chloride: 99 mmol/L (ref 96–106)
Creatinine, Ser: 0.67 mg/dL (ref 0.57–1.00)
Globulin, Total: 1.9 g/dL (ref 1.5–4.5)
Glucose: 81 mg/dL (ref 70–99)
Potassium: 5.1 mmol/L (ref 3.5–5.2)
Sodium: 140 mmol/L (ref 134–144)
Total Protein: 6.3 g/dL (ref 6.0–8.5)
eGFR: 88 mL/min/{1.73_m2} (ref >59)

## 2022-02-18 NOTE — Telephone Encounter (Signed)
Waiting on verification info  - will update asap ? ?Pt aware - we will call her  ?

## 2022-02-23 ENCOUNTER — Encounter: Payer: Self-pay | Admitting: Family Medicine

## 2022-04-02 ENCOUNTER — Ambulatory Visit (INDEPENDENT_AMBULATORY_CARE_PROVIDER_SITE_OTHER): Payer: Medicare Other | Admitting: Emergency Medicine

## 2022-04-02 DIAGNOSIS — M810 Age-related osteoporosis without current pathological fracture: Secondary | ICD-10-CM | POA: Diagnosis not present

## 2022-04-02 MED ORDER — DENOSUMAB 60 MG/ML ~~LOC~~ SOSY
60.0000 mg | PREFILLED_SYRINGE | Freq: Once | SUBCUTANEOUS | Status: AC
  Administered 2022-04-02: 60 mg via SUBCUTANEOUS

## 2022-06-02 ENCOUNTER — Encounter: Payer: Self-pay | Admitting: *Deleted

## 2022-06-23 ENCOUNTER — Telehealth: Payer: Self-pay | Admitting: *Deleted

## 2022-06-23 NOTE — Patient Outreach (Signed)
  Care Coordination   06/23/2022 Name: Dorothy Pitts MRN: [id number] DOB: 06-17-1941   Care Coordination Outreach Attempts:  An unsuccessful telephone outreach was attempted today to offer the patient information about available care coordination services as a benefit of their health plan.   Follow Up Plan:  Additional outreach attempts will be made to offer the patient care coordination information and services.   Encounter Outcome:  No Answer  Care Coordination Interventions Activated:  Yes   Care Coordination Interventions:  No, not indicated    SIG Gean Maidens BSN RN [facility] [phone]

## 2022-07-10 ENCOUNTER — Other Ambulatory Visit: Payer: Self-pay | Admitting: Family Medicine

## 2022-07-10 DIAGNOSIS — I10 Essential (primary) hypertension: Secondary | ICD-10-CM

## 2022-07-31 ENCOUNTER — Ambulatory Visit (INDEPENDENT_AMBULATORY_CARE_PROVIDER_SITE_OTHER): Payer: Medicare Other | Admitting: Family Medicine

## 2022-07-31 ENCOUNTER — Encounter: Payer: Self-pay | Admitting: Family Medicine

## 2022-07-31 VITALS — BP 103/54 | HR 78 | Temp 97.6°F | Ht 61.0 in | Wt 116.4 lb

## 2022-07-31 DIAGNOSIS — S81812A Laceration without foreign body, left lower leg, initial encounter: Secondary | ICD-10-CM

## 2022-07-31 NOTE — Patient Instructions (Signed)
Skin Tear A skin tear is a wound in which the top layers of skin have peeled off from the deeper skin or tissues underneath. This is a common problem as people get older because the skin becomes thinner and more fragile. In addition, some medicines, such as oral corticosteroids, can lead to thinning skin if they are taken for long periods of time. A skin tear is often repaired with tape or skin adhesive strips. Depending on the location of the wound, a bandage (dressing) may be applied over the tape or adhesive strips. Follow these instructions at home: Wound care  Clean the wound as told by your health care provider. You may be instructed to keep the wound dry for the first few days. If you are told to clean the wound: Wash the wound as told by your health care provider. This may include using mild soap and water, a wound cleanser, or a salt-water (saline) solution. If using soap, rinse the wound with water to remove all soap. Do not rub the wound dry. Pat it gently with a clean towel or let it air-dry. Change any dressings as told by your health care provider. This may include changing the dressing if it gets wet, gets dirty, or starts to smell bad. Wash your hands with soap and water for at least 20 seconds before and after you change your bandage (dressing). If soap and water are not available, use hand sanitizer. Leave tape or skin adhesive strips in place. These skin closures may need to stay in place for 2 weeks or longer. If adhesive strip edges start to loosen and curl up, you may trim the loose edges. Do not remove adhesive strips completely unless your health care provider tells you to do that. Check your wound every day for signs of infection. Check for: Redness, swelling, or pain. More fluid or blood. Warmth. Pus or a bad smell. Do not scratch or pick at the wound. Protect the injured area until it has healed. Medicines Take or apply over-the-counter and prescription medicines only  as told by your health care provider. If you were prescribed an antibiotic medicine, take or apply it as told by your health care provider. Do not stop using the antibiotic even if your condition improves. General instructions  Keep the dressing dry as told by your health care provider. Do not take baths, swim, use a hot tub, or do anything that puts your wound underwater until your health care provider approves. Ask your health care provider if you may take showers. You may only be allowed to take sponge baths. Keep all follow-up visits. This is important. Contact a health care provider if: You have redness, swelling, or pain around your wound. You have more fluid or blood coming from your wound. Your wound, or the area around your wound, feels warm to the touch. You have pus or a bad smell coming from your wound. Get help right away if: You have a red streak that goes away from the skin tear. You have a fever and chills, and your symptoms suddenly get worse. Summary A skin tear is a wound in which the top layers of skin have peeled off from the deeper skin or tissues underneath. A skin tear is often repaired with tape or skin adhesive strips, and a bandage (dressing) may be applied over the tape or the adhesive strips. Change any dressings as told by your health care provider. Take or apply over-the-counter and prescription medicines only as told by   your health care provider. Contact a health care provider if you have signs of infection. This information is not intended to replace advice given to you by your health care provider. Make sure you discuss any questions you have with your health care provider. Document Revised: 01/17/2020 Document Reviewed: 01/17/2020 Elsevier Patient Education  2023 Elsevier Inc.  

## 2022-07-31 NOTE — Progress Notes (Signed)
   Acute Office Visit  Subjective:     Patient ID: Dorothy Pitts, female    DOB: [birth year], 81 y.o.   MRN: [id number]  Chief Complaint  Patient presents with   skin tear    HPI Patient is in today for a skin tear to her left shin. Her granddaughter was sitting in her lap yesterday and her shoe scraped against her skin and caused a skin tear. She reports that it was tender last night but not so much today. There has been some thing, clear exudate. Has not been bleeding today. Denies fever or chills.   ROS As per HPI.      Objective:    BP (!) 103/54   Pulse 78   Temp 97.6 F (36.4 C) (Temporal)   Ht 5\' 1"  (1.549 m)   Wt 116 lb 6 oz (52.8 kg)   SpO2 98%   BMI 21.99 kg/m    Physical Exam Vitals and nursing note reviewed.  Constitutional:      General: She is not in acute distress.    Appearance: She is not ill-appearing, toxic-appearing or diaphoretic.  Pulmonary:     Effort: Pulmonary effort is normal. No respiratory distress.  Musculoskeletal:     Right lower leg: No edema.     Left lower leg: No edema.  Skin:    General: Skin is warm and dry.     Comments: 2 cm shallow skin tear to left shin. Thin clear drainage present. No erythema, warmth, or tenderness.   Neurological:     General: No focal deficit present.     Mental Status: She is alert and oriented to person, place, and time.  Psychiatric:        Mood and Affect: Mood normal.        Behavior: Behavior normal.     No results found for any visits on 07/31/22.      Assessment & Plan:   Tanairy was seen today for skin tear.  Diagnoses and all orders for this visit:  Noninfected skin tear of left lower extremity, initial encounter Cleansed with dressing applied today. Discussed home wound care and return precautions.   The patient indicates understanding of these issues and agrees with the plan.  Gwenlyn Perking, FNP

## 2022-08-06 ENCOUNTER — Other Ambulatory Visit: Payer: Self-pay | Admitting: *Deleted

## 2022-08-06 NOTE — Patient Outreach (Signed)
  Care Coordination   08/06/2022  Name: Dorothy Pitts MRN: [id number] DOB: 03/12/1941   Care Coordination Outreach Attempts:  A second unsuccessful outreach was attempted today to offer the patient with information about available care coordination services as a benefit of their health plan.   HIPAA compliant messages left on voicemail, providing contact information for CSW, encouraging patient to return CSW's call at her earliest convenience.  Follow Up Plan:  Additional outreach attempts will be made to offer the patient care coordination information and services.   Encounter Outcome:  No Answer.   Care Coordination Interventions Activated:  No.    Care Coordination Interventions:  No, not indicated.    Nat Christen, BSW, MSW, LCSW  Licensed Education officer, [facility]  Mailing [place] [address], [place], [place] [address] Physical Address-[address] [place], [place] [address] Toll Free Main # [phone] Fax # [phone] Cell # [phone] [email or web address]

## 2022-08-10 ENCOUNTER — Other Ambulatory Visit: Payer: Self-pay | Admitting: Family Medicine

## 2022-08-10 DIAGNOSIS — I700 Atherosclerosis of aorta: Secondary | ICD-10-CM

## 2022-08-10 DIAGNOSIS — E782 Mixed hyperlipidemia: Secondary | ICD-10-CM

## 2022-08-13 ENCOUNTER — Other Ambulatory Visit: Payer: Self-pay | Admitting: *Deleted

## 2022-08-13 NOTE — Patient Outreach (Signed)
  Care Coordination   08/13/2022  Name: Dorothy Pitts MRN: [id number] DOB: 1941/05/10   Care Coordination Outreach Attempts:  A third unsuccessful outreach was attempted today to offer the patient with information about available care coordination services as a benefit of their health plan. HIPAA compliant messages left on voicemail, providing contact information for CSW, encouraging patient to return CSW's call at her earliest convenience.    Follow Up Plan:  No further outreach attempts will be made at this time. We have been unable to contact the patient to offer or enroll patient in care coordination services.  Encounter Outcome:  No Answer.   Care Coordination Interventions Activated:  No.    Care Coordination Interventions:  No, not indicated.    Nat Christen, BSW, MSW, LCSW  Licensed Education officer, [facility]  Mailing [place] [address], [place],  [address] Physical Address[address], [place],  [address] Toll Free Main # [phone] Fax # [phone] Cell # [phone] [email or web address]

## 2022-08-17 ENCOUNTER — Other Ambulatory Visit: Payer: Self-pay | Admitting: Family Medicine

## 2022-08-17 DIAGNOSIS — I10 Essential (primary) hypertension: Secondary | ICD-10-CM

## 2022-08-19 ENCOUNTER — Encounter: Payer: Self-pay | Admitting: Family Medicine

## 2022-08-19 ENCOUNTER — Encounter: Payer: Self-pay | Admitting: Internal Medicine

## 2022-08-19 ENCOUNTER — Ambulatory Visit (INDEPENDENT_AMBULATORY_CARE_PROVIDER_SITE_OTHER): Payer: Medicare Other | Admitting: Family Medicine

## 2022-08-19 VITALS — BP 104/58 | HR 73 | Temp 97.8°F | Ht 61.0 in | Wt 117.4 lb

## 2022-08-19 DIAGNOSIS — L0390 Cellulitis, unspecified: Secondary | ICD-10-CM

## 2022-08-19 MED ORDER — DOXYCYCLINE MONOHYDRATE 100 MG PO CAPS: 100.0000 mg | ORAL_CAPSULE | Freq: Two times a day (BID) | ORAL | 0 refills | Status: AC

## 2022-08-19 NOTE — Patient Instructions (Signed)

## 2022-08-19 NOTE — Progress Notes (Signed)
   Acute Office Visit  Subjective:     Patient ID: Dorothy Pitts, female    DOB: 04-22-1941, 81 y.o.   MRN: [id number]  Chief Complaint  Patient presents with   Wound Check    HPI Patient is in today for a wound check. She was evaluated on 07/31/22 for a skin tear that had occurred on 07/30/22 to her left shin. The tear was cleansed and dressing was applied. She was instructed to on home wound care and return precautions. She returns today as she has been having increasing redness and tenderness around the tear. The tear itself has been healing well. Denies drainage, chills, or fever. She was keeping the area dressed but has not been recently as it was hurting to remove the dressing. She has been cleaning the area with soap and water.   ROS As per HPI.      Objective:    BP (!) 104/58   Pulse 73   Temp 97.8 F (36.6 C) (Temporal)   Ht 5\' 1"  (1.549 m)   Wt 117 lb 6 oz (53.2 kg)   SpO2 96%   BMI 22.18 kg/m    Physical Exam Vitals and nursing note reviewed.  Constitutional:      General: She is not in acute distress.    Appearance: She is not ill-appearing, toxic-appearing or diaphoretic.  Pulmonary:     Effort: Pulmonary effort is normal. No respiratory distress.  Musculoskeletal:     Right lower leg: No edema.  Skin:    General: Skin is warm and dry.     Comments: 3 cm scabbed laceration to left anterior lower leg with mild swelling. Erythema and significant tenderness around laceration.   Neurological:     General: No focal deficit present.     Mental Status: She is alert and oriented to person, place, and time.  Psychiatric:        Mood and Affect: Mood normal.     No results found for any visits on 08/19/22.      Assessment & Plan:   Dorothy Pitts was seen today for wound check.  Diagnoses and all orders for this visit:  Wound cellulitis Doxycyline as below. Wound cleansed and dressed today. Discussed home wound care. She has a follow up appt on 11/15 for  her chronic illnesses. Discussed follow up sooner for new or worsening symptoms or if symptoms do not improve.  -     doxycycline (MONODOX) 100 MG capsule; Take 1 capsule (100 mg total) by mouth 2 (two) times daily for 10 days.   The patient indicates understanding of these issues and agrees with the plan.   Gwenlyn Perking, FNP

## 2022-08-20 ENCOUNTER — Ambulatory Visit: Payer: Medicare Other | Admitting: Family Medicine

## 2022-08-24 ENCOUNTER — Ambulatory Visit: Payer: Medicare Other | Admitting: Family Medicine

## 2022-09-09 ENCOUNTER — Ambulatory Visit: Payer: Medicare Other

## 2022-09-09 ENCOUNTER — Ambulatory Visit (INDEPENDENT_AMBULATORY_CARE_PROVIDER_SITE_OTHER): Payer: Medicare Other | Admitting: Family Medicine

## 2022-09-09 ENCOUNTER — Encounter: Payer: Self-pay | Admitting: Family Medicine

## 2022-09-09 VITALS — BP 107/54 | HR 73 | Temp 98.6°F | Ht 61.0 in | Wt 118.6 lb

## 2022-09-09 DIAGNOSIS — R7303 Prediabetes: Secondary | ICD-10-CM

## 2022-09-09 DIAGNOSIS — Z780 Asymptomatic menopausal state: Secondary | ICD-10-CM | POA: Insufficient documentation

## 2022-09-09 DIAGNOSIS — M810 Age-related osteoporosis without current pathological fracture: Secondary | ICD-10-CM

## 2022-09-09 DIAGNOSIS — I10 Essential (primary) hypertension: Secondary | ICD-10-CM

## 2022-09-09 DIAGNOSIS — E782 Mixed hyperlipidemia: Secondary | ICD-10-CM | POA: Diagnosis not present

## 2022-09-09 DIAGNOSIS — L819 Disorder of pigmentation, unspecified: Secondary | ICD-10-CM

## 2022-09-09 DIAGNOSIS — R1031 Right lower quadrant pain: Secondary | ICD-10-CM | POA: Diagnosis not present

## 2022-09-09 DIAGNOSIS — G8929 Other chronic pain: Secondary | ICD-10-CM

## 2022-09-09 MED ORDER — SPIRONOLACTONE 50 MG PO TABS: 50.0000 mg | ORAL_TABLET | Freq: Every day | ORAL | 0 refills | Status: DC

## 2022-09-09 MED ORDER — ENALAPRIL MALEATE 5 MG PO TABS: 5.0000 mg | ORAL_TABLET | Freq: Every day | ORAL | 3 refills | Status: DC

## 2022-09-09 NOTE — Progress Notes (Signed)
Complete physical exam  Patient: Dorothy Pitts   DOB: 1941-08-21   81 y.o. Female  MRN: [id number]  Subjective:    Chief Complaint  Patient presents with   Annual Exam    Dorothy Pitts is a 81 y.o. female who presents today for a complete physical exam. She reports consuming a general diet. Home exercise routine includes golf. She generally feels well. She reports sleeping well. She does have additional problems to discuss today.   HTN/HLD Complaint with meds - Yes Current Medications - spironalactone, enalapril Exercising Regularly - tries to stay active, plays golf  Pertinent ROS:  Headache - No Fatigue - No Visual Disturbances - No Chest pain - No Dyspnea - No Palpitations - No LE edema - No She is not taking crestor for her HLD.   2. Osteoporosis She is on prolia and is due for her DEXA today.   3. Skin lesion She has a skin lesion on her chest that has changed recently. She also has a lesion on her left forearm that has changed as well. She has seen a dermatologist in the past.   4. Groin pain She has chronic right groin pain. This flares intermittent and cause pain that radiates down her thigh. This is aggravated with sitting or activity and improves with rest. She has done PT in the past with good relief and would like to do this again.   BP Readings from Last 3 Encounters:  09/09/22 (!) 107/54  08/19/22 (!) 104/58  07/31/22 (!) 103/54     Most recent fall risk assessment:    08/19/2022   11:13 AM  Fall Risk   Falls in the past year? 0     Most recent depression screenings:    08/19/2022   11:13 AM 02/17/2022   11:07 AM  PHQ 2/9 Scores  PHQ - 2 Score 0 0  PHQ- 9 Score 0 0        Patient Care Team: Gwenlyn Perking, FNP as PCP - General (Family Medicine) Pyrtle, Lajuan Lines, MD as Consulting Physician (Gastroenterology) Vania Rea, MD as Consulting Physician (Obstetrics and Gynecology) Clovis Riley, MD as Consulting Physician  (General Surgery)   Outpatient Medications Prior to Visit  Medication Sig   Ascorbic Acid (VITAMIN C) 1000 MG tablet Take 1,000 mg by mouth daily. Reported on 11/21/2015   Calcium Carbonate-Vitamin D (CALTRATE 600+D) 600-400 MG-UNIT tablet Take 1 tablet by mouth daily.   Cholecalciferol (VITAMIN D3) 5000 units CAPS Take 1 capsule by mouth every Monday, Wednesday, and Friday.   denosumab (PROLIA) 60 MG/ML SOSY injection Inject 60 mg into the skin every 6 (six) months.   enalapril (VASOTEC) 5 MG tablet TAKE 1 TABLET (5 MG TOTAL) BY MOUTH DAILY.   famotidine-calcium carbonate-magnesium hydroxide (PEPCID COMPLETE) 10-800-165 MG chewable tablet Chew 1 tablet by mouth daily.   loratadine (CLARITIN) 10 MG tablet Take 10 mg by mouth daily.   nystatin cream (MYCOSTATIN) Apply 1 application topically 2 (two) times daily.   Polyethylene Glycol 3350 (MIRALAX PO) Take 1 Dose by mouth daily.   Probiotic Product (ALIGN PO) Take 1 tablet by mouth daily.   rosuvastatin (CRESTOR) 5 MG tablet TAKE 1 TABLET BY MOUTH ONCE A WEEK.   spironolactone (ALDACTONE) 50 MG tablet TAKE 1 TABLET BY MOUTH EVERY DAY   triamcinolone cream (KENALOG) 0.1 % APPLY TO AFFECTED AREA TWICE A DAY   vitamin B-12 (CYANOCOBALAMIN) 1000 MCG tablet Take 1,000 mcg by mouth daily.  vitamin k 100 MCG tablet Take 100 mcg by mouth daily.   No facility-administered medications prior to visit.    ROS Negative unless specially indicated above in HPI.     Objective:     BP (!) 107/54   Pulse 73   Temp 98.6 F (37 C)   Ht _0  (1.549 m)   Wt 118 lb 9.6 oz (53.8 kg)   SpO2 97%   BMI 22.41 kg/m    Physical Exam Vitals and nursing note reviewed.  Constitutional:      General: She is not in acute distress.    Appearance: Normal appearance. She is not ill-appearing.  HENT:     Head: Normocephalic.     Right Ear: Tympanic membrane, ear canal and external ear normal.     Left Ear: Tympanic membrane, ear canal and external ear  normal.     Nose: Nose normal.     Mouth/Throat:     Mouth: Mucous membranes are dry.     Pharynx: Oropharynx is clear.  Eyes:     Extraocular Movements: Extraocular movements intact.     Conjunctiva/sclera: Conjunctivae normal.     Pupils: Pupils are equal, round, and reactive to light.  Cardiovascular:     Rate and Rhythm: Normal rate and regular rhythm.     Pulses: Normal pulses.     Heart sounds: Normal heart sounds. No murmur heard.    No friction rub. No gallop.  Pulmonary:     Effort: Pulmonary effort is normal.     Breath sounds: Normal breath sounds.  Abdominal:     General: Bowel sounds are normal. There is no distension.     Palpations: Abdomen is soft. There is no mass.     Tenderness: There is no abdominal tenderness. There is no guarding.  Musculoskeletal:     Cervical back: Normal range of motion and neck supple. No tenderness.     Right lower leg: No edema.     Left lower leg: No edema.  Skin:    General: Skin is warm and dry.     Capillary Refill: Capillary refill takes less than 2 seconds.     Findings: No lesion or rash.  Neurological:     General: No focal deficit present.     Mental Status: She is alert and oriented to person, place, and time.     Cranial Nerves: No cranial nerve deficit.     Motor: No weakness.     Gait: Gait normal.  Psychiatric:        Mood and Affect: Mood normal.        Behavior: Behavior normal.        Thought Content: Thought content normal.        Judgment: Judgment normal.      No results found for any visits on 09/09/22.     Assessment & Plan:  Allure was seen today for annual exam.  Diagnoses and all orders for this visit:  Pre-diabetes A1c pending. Diet and exercise.  -     Bayer DCA Hb A1c Waived -     CMP14+EGFR  Primary hypertension Well controlled on current regimen. Labs pending -     CBC with Differential/Platelet -     enalapril (VASOTEC) 5 MG tablet; Take 1 tablet (5 mg total) by mouth daily. -      spironolactone (ALDACTONE) 50 MG tablet; Take 1 tablet (50 mg total) by mouth daily.  Mixed hyperlipidemia Discontinued statin per patient  preference. Labs pending.  -     Lipid panel  Groin pain, chronic, right Referral to PT.  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 DEXA today.  -     DG [facility] DEXA; Future     Routine Health Maintenance and Physical Exam  Immunization History  Administered Date(s) Administered   Td 04/19/2006   Tdap 12/30/2020   Zoster, Live 12/24/2009    Health Maintenance  Topic Date Due   DEXA SCAN  07/22/2022   Zoster Vaccines- Shingrix (1 of 2) 12/10/2022 (Originally 07/09/1960)   INFLUENZA VACCINE  01/24/2023 (Originally 05/26/2022)   Pneumonia Vaccine 30+ Years old (1 - PCV) 02/18/2023 (Originally 07/09/2006)   Medicare Annual Wellness (AWV)  01/08/2023   TETANUS/TDAP  12/31/2030   HPV VACCINES  Aged Out   COVID-19 Vaccine  Discontinued    Discussed health benefits of physical activity, and encouraged her to engage in regular exercise appropriate for her age and condition.    Return in about 6 months (around 03/10/2023) for chronic follow up with labs.   The patient indicates understanding of these issues and agrees with the plan.  Gwenlyn Perking, FNP

## 2022-09-10 ENCOUNTER — Other Ambulatory Visit: Payer: Self-pay | Admitting: Family Medicine

## 2022-09-10 LAB — CBC WITH DIFFERENTIAL/PLATELET
Basophils Absolute: 0 10*3/uL (ref 0.0–0.2)
Basos: 1 %
EOS (ABSOLUTE): 0.2 10*3/uL (ref 0.0–0.4)
Eos: 4 %
Hematocrit: 39.8 % (ref 34.0–46.6)
Hemoglobin: 13 g/dL (ref 11.1–15.9)
Immature Grans (Abs): 0 10*3/uL (ref 0.0–0.1)
Immature Granulocytes: 0 %
Lymphocytes Absolute: 1.8 10*3/uL (ref 0.7–3.1)
Lymphs: 32 %
MCH: 31.5 pg (ref 26.6–33.0)
MCHC: 32.7 g/dL (ref 31.5–35.7)
MCV: 96 fL (ref 79–97)
Monocytes Absolute: 0.5 10*3/uL (ref 0.1–0.9)
Monocytes: 9 %
Neutrophils Absolute: 3 10*3/uL (ref 1.4–7.0)
Neutrophils: 54 %
Platelets: 256 10*3/uL (ref 150–450)
RBC: 4.13 x10E6/uL (ref 3.77–5.28)
RDW: 12.4 % (ref 11.7–15.4)
WBC: 5.4 10*3/uL (ref 3.4–10.8)

## 2022-09-10 LAB — CMP14+EGFR
ALT: 11 IU/L (ref 0–32)
AST: 21 IU/L (ref 0–40)
Albumin/Globulin Ratio: 2.4 — ABNORMAL HIGH (ref 1.2–2.2)
Albumin: 4.3 g/dL (ref 3.7–4.7)
Alkaline Phosphatase: 44 IU/L (ref 44–121)
BUN/Creatinine Ratio: 45 — ABNORMAL HIGH (ref 12–28)
BUN: 31 mg/dL — ABNORMAL HIGH (ref 8–27)
Bilirubin Total: 0.2 mg/dL (ref 0.0–1.2)
CO2: 26 mmol/L (ref 20–29)
Calcium: 9.6 mg/dL (ref 8.7–10.3)
Chloride: 97 mmol/L (ref 96–106)
Creatinine, Ser: 0.69 mg/dL (ref 0.57–1.00)
Globulin, Total: 1.8 g/dL (ref 1.5–4.5)
Glucose: 103 mg/dL — ABNORMAL HIGH (ref 70–99)
Potassium: 4.1 mmol/L (ref 3.5–5.2)
Sodium: 138 mmol/L (ref 134–144)
Total Protein: 6.1 g/dL (ref 6.0–8.5)
eGFR: 87 mL/min/{1.73_m2} (ref >59)

## 2022-09-10 LAB — LIPID PANEL
Chol/HDL Ratio: 3.7 ratio (ref 0.0–4.4)
Cholesterol, Total: 264 mg/dL — ABNORMAL HIGH (ref 100–199)
HDL: 72 mg/dL (ref >39)
LDL Chol Calc (NIH): 146 mg/dL — ABNORMAL HIGH (ref 0–99)
Triglycerides: 256 mg/dL — ABNORMAL HIGH (ref 0–149)
VLDL Cholesterol Cal: 46 mg/dL — ABNORMAL HIGH (ref 5–40)

## 2022-09-10 LAB — BAYER DCA HB A1C WAIVED: HB A1C (BAYER DCA - WAIVED): 5.5 % (ref 4.8–5.6)

## 2022-09-14 ENCOUNTER — Other Ambulatory Visit: Payer: Self-pay | Admitting: *Deleted

## 2022-09-14 MED ORDER — EZETIMIBE 10 MG PO TABS: 10.0000 mg | ORAL_TABLET | Freq: Every day | ORAL | 3 refills | Status: DC

## 2022-09-21 ENCOUNTER — Ambulatory Visit: Payer: Medicare Other | Attending: Family Medicine | Admitting: Physical Therapy

## 2022-09-21 ENCOUNTER — Encounter: Payer: Self-pay | Admitting: Physical Therapy

## 2022-09-21 ENCOUNTER — Other Ambulatory Visit: Payer: Self-pay

## 2022-09-21 DIAGNOSIS — M79651 Pain in right thigh: Secondary | ICD-10-CM | POA: Diagnosis not present

## 2022-09-21 DIAGNOSIS — G8929 Other chronic pain: Secondary | ICD-10-CM | POA: Insufficient documentation

## 2022-09-21 DIAGNOSIS — R1031 Right lower quadrant pain: Secondary | ICD-10-CM | POA: Diagnosis not present

## 2022-09-21 NOTE — Therapy (Signed)
OUTPATIENT PHYSICAL THERAPY LOWER EXTREMITY EVALUATION   Patient Name: Dorothy Pitts MRN: [id number] DOB:07-Feb-1941, 81 y.o., female Today's Date: 09/21/2022  END OF SESSION:  PT End of Session - 09/21/22 1152     Visit Number 1    Number of Visits 8    Date for PT Re-Evaluation 12/20/22    Authorization Type FOTO AT LEAST EVERY 5TH VISIT.  PROGRESS NOTE AT 10TH VISIT.  KX MODIFIER AFTER 15 VISITS.    PT Start Time 1117    Behavior During Therapy WFL for tasks assessed/performed             Past Medical History:  Diagnosis Date   Acute diverticulitis 10/07/2013   With microperforation.   Anemia 10/08/2013   Bowel obstruction (HCC)    Cataract    Diverticulosis of colon (without mention of hemorrhage)    GERD (gastroesophageal reflux disease)    Hemorrhoids    HOH (hard of hearing)    Hyperlipidemia    Hypertension    IBS (irritable bowel syndrome)    Osteoporosis    PONV (postoperative nausea and vomiting)    Thoracic aortic atherosclerosis (HCC)    UTI (lower urinary tract infection) 10/07/2013   Vitamin D deficiency    Past Surgical History:  Procedure Laterality Date   ABDOMINAL HYSTERECTOMY     APPENDECTOMY     BILATERAL OOPHORECTOMY     BREAST LUMPECTOMY Left    CATARACT EXTRACTION     COLON RESECTION N/A 12/01/2013   Procedure: HAND ASSISTED LAPAROSCOPIC PARTIAL COLECTOMY;  Surgeon: Dalia Heading, MD;  Location: [facility];  Service: General;  Laterality: N/A;   EYE SURGERY     HEMORRHOID SURGERY     HERNIA REPAIR  09/04/2016   LAPAROSCOPIC VENTRAL INCISIONAL HERNIA REPAIR POSSIBLE OPEN (N/A)   INCISIONAL HERNIA REPAIR N/A 09/04/2016   Procedure: LAPAROSCOPIC VENTRAL INCISIONAL HERNIA REPAIR POSSIBLE OPEN;  Surgeon: Ovidio Kin, MD;  Location: [facility];  Service: General;  Laterality: N/A;   INSERTION OF MESH N/A 09/04/2016   Procedure: INSERTION OF MESH;  Surgeon: Ovidio Kin, MD;  Location: [facility];  Service: General;  Laterality: N/A;   PARTIAL  HYSTERECTOMY     TONSILLECTOMY      REFERRING PROVIDER: Harlow Mares  REFERRING DIAG: Chronic right groin pain.  THERAPY DIAG:  Pain in right thigh  Rationale for Evaluation and Treatment: Rehabilitation  ONSET DATE: About a year.  SUBJECTIVE:   SUBJECTIVE STATEMENT: The patient returns to PT having last been seen in February of this year with c/o right hip pain.  She states she did well with PT and now her pain is much less frequent than it was before.  She has returned to [place] and this will occasionally reproduce her pain.  Her pain usually happens after sitting for prolonged periods of time and then getting up.  She will experience a sharp pain in her right groin region that typically resolves in about 6 to 7 steps.  She reports that the elastic band from her underpants can cause her pain at times as well.  No pain reported today.  PERTINENT HISTORY: OP, OA, Hernia repair. PAIN:  Are you having pain? No  PRECAUTIONS: Other: OP.  WEIGHT BEARING RESTRICTIONS: No  FALLS:  Has patient fallen in last 6 months? No  LIVING ENVIRONMENT: Lives with: lives with their family Lives in: House/apartment Has following equipment at home: None  OCCUPATION: Retired.  PLOF: Independent  PATIENT GOALS: Not have right groin pain.  NEXT MD VISIT:   OBJECTIVE:  PATIENT SURVEYS:  FOTO Complete.  POSTURE: No Significant postural limitations  PALPATION: Tenderness reported over proximal right adductor region.  LOWER EXTREMITY ROM:  Normal right hip range of motion. LOWER EXTREMITY MMT:  No LE strength deficits noted.  LOWER EXTREMITY SPECIAL TESTS:  (-) right Hip Scour and FABER testing. GAIT: WNL.  TODAY'S TREATMENT:                                                                                                                              DATE: HMP and IFC at 80-150 Hz on 40% scan x 20 minutes to patient's right proximal adductor region with HMP.  Patient tolerated  treatment without complaint with normal modality response following removal of modality.      ASSESSMENT:  CLINICAL IMPRESSION: The patient presents to OPPT with c/o occasional right hip pain in the groin region that has been ongoing for about a year.  She had PT in the last and found it very helpful.  Her pain is less frequent with reproduction of pain usually do to Golf and when she first gets up to walk from a seated position.  The patient's right hip range of motion is normal.  She had no strength deficits and special testing was negative.  Her CC today was tenderness over her right proximal adductor musculature.  Patient will benefit from skilled physical therapy intervention to address pain and deficits.  OBJECTIVE IMPAIRMENTS: decreased activity tolerance, increased muscle spasms, and pain.   ACTIVITY LIMITATIONS:  Sit to stand.  PARTICIPATION LIMITATIONS:  Golf.  PERSONAL FACTORS: Time since onset of injury/illness/exacerbation are also affecting patient's functional outcome.   REHAB POTENTIAL: Excellent  CLINICAL DECISION MAKING: Stable/uncomplicated  EVALUATION COMPLEXITY: Low   GOALS:  SHORT TERM GOALS: Target date: 12/06/21 Ind with an HEP. Baseline: Goal status: INITIAL   LONG TERM GOALS: Target date: 10/19/22  Golf with no pain. Baseline:  Goal status: INITIAL  2.  Sit to stand with no pain. Baseline:  Goal status: INITIAL  3.  No palpable right proximal musculature tenderness. Baseline:  Goal status: INITIAL   PLAN:  PT FREQUENCY: 2x/week  PT DURATION: 4 weeks  PLANNED INTERVENTIONS: Therapeutic exercises, Therapeutic activity, Patient/Family education, Self Care, Dry Needling, Electrical stimulation, Cryotherapy, Moist heat, Taping, Vasopneumatic device, Ultrasound, and Manual therapy  PLAN FOR NEXT SESSION: Combo e'stim/US, STW/M, Nustep/Recumbent bike, pain-free there ex.   Jaysie Benthall, [place], PT 09/21/2022, 11:54 AM

## 2022-09-28 ENCOUNTER — Ambulatory Visit: Payer: Medicare Other | Attending: Family Medicine | Admitting: Physical Therapy

## 2022-09-28 ENCOUNTER — Telehealth: Payer: Self-pay

## 2022-09-28 ENCOUNTER — Encounter: Payer: Self-pay | Admitting: Physical Therapy

## 2022-09-28 DIAGNOSIS — M79651 Pain in right thigh: Secondary | ICD-10-CM | POA: Insufficient documentation

## 2022-09-28 NOTE — Therapy (Signed)
OUTPATIENT PHYSICAL THERAPY LOWER EXTREMITY TREATMENT   Patient Name: Dorothy Pitts MRN: [id number] DOB:10-27-1940, 81 y.o., female Today's Date: 09/28/2022  END OF SESSION:  PT End of Session - 09/28/22 1034     Visit Number 2    Number of Visits 8    Date for PT Re-Evaluation 12/20/22    Authorization Type FOTO AT LEAST EVERY 5TH VISIT.  PROGRESS NOTE AT 10TH VISIT.  KX MODIFIER AFTER 15 VISITS.    PT Start Time 1032    PT Stop Time 1113    PT Time Calculation (min) 41 min    Activity Tolerance Patient tolerated treatment well    Behavior During Therapy [facility] for tasks assessed/performed            Past Medical History:  Diagnosis Date   Acute diverticulitis 10/07/2013   With microperforation.   Anemia 10/08/2013   Bowel obstruction (HCC)    Cataract    Diverticulosis of colon (without mention of hemorrhage)    GERD (gastroesophageal reflux disease)    Hemorrhoids    HOH (hard of hearing)    Hyperlipidemia    Hypertension    IBS (irritable bowel syndrome)    Osteoporosis    PONV (postoperative nausea and vomiting)    Thoracic aortic atherosclerosis ([place])    UTI (lower urinary tract infection) 10/07/2013   Vitamin D deficiency    Past Surgical History:  Procedure Laterality Date   ABDOMINAL HYSTERECTOMY     APPENDECTOMY     BILATERAL OOPHORECTOMY     BREAST LUMPECTOMY Left    CATARACT EXTRACTION     COLON RESECTION N/A 12/01/2013   Procedure: HAND ASSISTED LAPAROSCOPIC PARTIAL COLECTOMY;  Surgeon: Jamesetta So, MD;  Location: [facility];  Service: General;  Laterality: N/A;   [place]  09/04/2016   LAPAROSCOPIC VENTRAL INCISIONAL HERNIA REPAIR POSSIBLE OPEN (N/A)   INCISIONAL HERNIA REPAIR N/A 09/04/2016   Procedure: LAPAROSCOPIC VENTRAL INCISIONAL HERNIA REPAIR POSSIBLE OPEN;  Surgeon: Alphonsa Overall, MD;  Location: [place];  Service: General;  Laterality: N/A;   INSERTION OF MESH N/A 09/04/2016   Procedure:  INSERTION OF MESH;  Surgeon: Alphonsa Overall, MD;  Location: [place];  Service: General;  Laterality: N/A;   PARTIAL HYSTERECTOMY     TONSILLECTOMY      REFERRING PROVIDER: Marjorie Smolder  REFERRING DIAG: Chronic right groin pain.  THERAPY DIAG:  Pain in right thigh  Rationale for Evaluation and Treatment: Rehabilitation  ONSET DATE: About a year.  SUBJECTIVE:   SUBJECTIVE STATEMENT: No pain today but plans to play golf next week.  PERTINENT HISTORY: OP, OA, Hernia repair. PAIN:  Are you having pain? No  PRECAUTIONS: Other: OP.  NEXT MD VISIT: 09/2022 for Prolia injection  OBJECTIVE:  PATIENT SURVEYS:  FOTO Complete.  TODAY'S TREATMENT:  DATE: 09/28/22                                    EXERCISE LOG  Exercise Repetitions and Resistance Comments  Bike L4 x15 min   Lunges RLE 8" step   Side lunges X20 reps   Hip abduction X20 reps   Ball squeeze X2 min    Bridge with ball squeeze X15 reps    SLR X10 reps Stopped due to pain   Blank cell = exercise not performed today   Modalities  Date:  Unattended Estim: Hip, Pre-Mod, 10 mins, Pain Hot Pack: Hip, 10 mins, Pain  ASSESSMENT:  CLINICAL IMPRESSION: Patient reports that she does not currently have pain but does with golf swing or after prolonged sitting. Patient progressed through hip stretching and strengthening. No complaints of pain were provided by patient until SLRs. Normal modalities response noted following removal of the modalities. No complaints upon end of treatment.  OBJECTIVE IMPAIRMENTS: decreased activity tolerance, increased muscle spasms, and pain.   ACTIVITY LIMITATIONS:  Sit to stand.  PARTICIPATION LIMITATIONS:  Golf.  PERSONAL FACTORS: Time since onset of injury/illness/exacerbation are also affecting patient's functional outcome.   REHAB POTENTIAL:  Excellent  CLINICAL DECISION MAKING: Stable/uncomplicated  EVALUATION COMPLEXITY: Low  GOALS:  SHORT TERM GOALS: Target date: 12/06/21 Ind with an HEP. Baseline: Goal status: INITIAL   LONG TERM GOALS: Target date: 10/19/22  Golf with no pain. Baseline:  Goal status: INITIAL  2.  Sit to stand with no pain. Baseline:  Goal status: INITIAL  3.  No palpable right proximal musculature tenderness. Baseline:  Goal status: INITIAL  PLAN:  PT FREQUENCY: 2x/week  PT DURATION: 4 weeks  PLANNED INTERVENTIONS: Therapeutic exercises, Therapeutic activity, Patient/Family education, Self Care, Dry Needling, Electrical stimulation, Cryotherapy, Moist heat, Taping, Vasopneumatic device, Ultrasound, and Manual therapy  PLAN FOR NEXT SESSION: Combo e'stim/US, STW/M, Nustep/Recumbent bike, pain-free there ex.  Marvell Fuller, PTA 09/28/2022, 12:08 PM

## 2022-09-28 NOTE — Telephone Encounter (Signed)
Patient is due for Prolia - please call patient

## 2022-09-29 NOTE — Telephone Encounter (Signed)
Patient owes $0 for Prolia.  She is [employer].  Please schedule

## 2022-10-01 NOTE — Telephone Encounter (Signed)
Appointment scheduled for 12/11.

## 2022-10-05 ENCOUNTER — Ambulatory Visit (INDEPENDENT_AMBULATORY_CARE_PROVIDER_SITE_OTHER): Payer: Medicare Other

## 2022-10-05 ENCOUNTER — Other Ambulatory Visit: Payer: Self-pay

## 2022-10-05 ENCOUNTER — Encounter: Payer: Self-pay | Admitting: Family Medicine

## 2022-10-05 ENCOUNTER — Ambulatory Visit: Payer: Medicare Other | Admitting: Physical Therapy

## 2022-10-05 ENCOUNTER — Encounter: Payer: Self-pay | Admitting: Physical Therapy

## 2022-10-05 ENCOUNTER — Ambulatory Visit (INDEPENDENT_AMBULATORY_CARE_PROVIDER_SITE_OTHER): Payer: Medicare Other | Admitting: Family Medicine

## 2022-10-05 VITALS — BP 118/58 | HR 71 | Temp 98.5°F | Ht 61.0 in | Wt 116.0 lb

## 2022-10-05 DIAGNOSIS — N3000 Acute cystitis without hematuria: Secondary | ICD-10-CM | POA: Diagnosis not present

## 2022-10-05 DIAGNOSIS — M79651 Pain in right thigh: Secondary | ICD-10-CM | POA: Diagnosis not present

## 2022-10-05 DIAGNOSIS — Z780 Asymptomatic menopausal state: Secondary | ICD-10-CM

## 2022-10-05 DIAGNOSIS — M810 Age-related osteoporosis without current pathological fracture: Secondary | ICD-10-CM

## 2022-10-05 LAB — MICROSCOPIC EXAMINATION
RBC, Urine: NONE SEEN /hpf (ref 0–2)
Renal Epithel, UA: NONE SEEN /hpf

## 2022-10-05 LAB — URINALYSIS, ROUTINE W REFLEX MICROSCOPIC
Bilirubin, UA: NEGATIVE
Ketones, UA: NEGATIVE
Nitrite, UA: NEGATIVE
Protein,UA: NEGATIVE
Specific Gravity, UA: 1.015 (ref 1.005–1.030)
Urobilinogen, Ur: 0.2 mg/dL (ref 0.2–1.0)
pH, UA: 7 (ref 5.0–7.5)

## 2022-10-05 MED ORDER — DENOSUMAB 60 MG/ML ~~LOC~~ SOSY
60.0000 mg | PREFILLED_SYRINGE | Freq: Once | SUBCUTANEOUS | Status: AC
  Administered 2022-10-05: 60 mg via SUBCUTANEOUS

## 2022-10-05 MED ORDER — SULFAMETHOXAZOLE-TRIMETHOPRIM 800-160 MG PO TABS: 1.0000 | ORAL_TABLET | Freq: Two times a day (BID) | ORAL | 0 refills | Status: AC

## 2022-10-05 NOTE — Therapy (Signed)
OUTPATIENT PHYSICAL THERAPY LOWER EXTREMITY TREATMENT   Patient Name: Dorothy Pitts MRN: [id number] DOB:07-04-1941, 81 y.o., female Today's Date: 10/05/2022  END OF SESSION:  PT End of Session - 10/05/22 1115     Visit Number 3    Number of Visits 8    Date for PT Re-Evaluation 12/20/22    Authorization Type FOTO AT LEAST EVERY 5TH VISIT.  PROGRESS NOTE AT 10TH VISIT.  KX MODIFIER AFTER 15 VISITS.    PT Start Time 1117    PT Stop Time 1200    PT Time Calculation (min) 43 min    Activity Tolerance Patient tolerated treatment well    Behavior During Therapy [facility] for tasks assessed/performed            Past Medical History:  Diagnosis Date   Acute diverticulitis 10/07/2013   With microperforation.   Anemia 10/08/2013   Bowel obstruction (HCC)    Cataract    Diverticulosis of colon (without mention of hemorrhage)    GERD (gastroesophageal reflux disease)    Hemorrhoids    HOH (hard of hearing)    Hyperlipidemia    Hypertension    IBS (irritable bowel syndrome)    Osteoporosis    PONV (postoperative nausea and vomiting)    Thoracic aortic atherosclerosis (HCC)    UTI (lower urinary tract infection) 10/07/2013   Vitamin D deficiency    Past Surgical History:  Procedure Laterality Date   ABDOMINAL HYSTERECTOMY     APPENDECTOMY     BILATERAL OOPHORECTOMY     BREAST LUMPECTOMY Left    CATARACT EXTRACTION     COLON RESECTION N/A 12/01/2013   Procedure: HAND ASSISTED LAPAROSCOPIC PARTIAL COLECTOMY;  Surgeon: Dalia Heading, MD;  Location: [facility];  Service: General;  Laterality: N/A;   EYE SURGERY     HEMORRHOID SURGERY     HERNIA REPAIR  09/04/2016   LAPAROSCOPIC VENTRAL INCISIONAL HERNIA REPAIR POSSIBLE OPEN (N/A)   INCISIONAL HERNIA REPAIR N/A 09/04/2016   Procedure: LAPAROSCOPIC VENTRAL INCISIONAL HERNIA REPAIR POSSIBLE OPEN;  Surgeon: Ovidio Kin, MD;  Location: [facility];  Service: General;  Laterality: N/A;   INSERTION OF MESH N/A 09/04/2016   Procedure:  INSERTION OF MESH;  Surgeon: Ovidio Kin, MD;  Location: [facility];  Service: General;  Laterality: N/A;   PARTIAL HYSTERECTOMY     TONSILLECTOMY     REFERRING PROVIDER: Harlow Mares  REFERRING DIAG: Chronic right groin pain.  THERAPY DIAG:  Pain in right thigh  Rationale for Evaluation and Treatment: Rehabilitation  ONSET DATE: About a year.  SUBJECTIVE:   SUBJECTIVE STATEMENT: Reports that she has not had any increased pain since last visit but has not played golf since last visit.  PERTINENT HISTORY: OP, OA, Hernia repair. PAIN:  Are you having pain? No  PRECAUTIONS: Other: OP.  NEXT MD VISIT: 09/2022 for Prolia injection  OBJECTIVE:  PATIENT SURVEYS:  FOTO Complete.  TODAY'S TREATMENT:  DATE: 10/05/22                                    EXERCISE LOG  Exercise Repetitions and Resistance Comments  Bike L4 x15 min   Lunges RLE 8" step   Side lunges X20 reps   Hip abduction X20 reps BLE   Ball squeeze with LAQ X2 min; no ankleweight for LAQ due to pain   Bridge with ball squeeze X15 reps    SLR X10 reps   SL clam X20 reps   SL hip abduction RLE x20 reps   Simulated golf swing  With red theraband x20 reps    Blank cell = exercise not performed today   Modalities  Date:  Unattended Estim: Hip, Pre-Mod, 15 mins, Pain Hot Pack: Hip, 15 mins, Pain  ASSESSMENT:  CLINICAL IMPRESSION: Patient presented in clinic with no pain but has not played golf since last visit. Patient progressed through stretching and strengthening exercises with patient reporting a mild discomfort of L adductors during today's session. Caution with any resisted tubing around LE or any pressure to LE as patient reports discomfort and sensitivity. Patient able to tolerate all therex fairly well. No excessive pain with simulated golf swing. Normal modalities response noted  following removal of the modalities.  OBJECTIVE IMPAIRMENTS: decreased activity tolerance, increased muscle spasms, and pain.   ACTIVITY LIMITATIONS:  Sit to stand.  PARTICIPATION LIMITATIONS:  Golf.  PERSONAL FACTORS: Time since onset of injury/illness/exacerbation are also affecting patient's functional outcome.   REHAB POTENTIAL: Excellent  CLINICAL DECISION MAKING: Stable/uncomplicated  EVALUATION COMPLEXITY: Low  GOALS:  SHORT TERM GOALS: Target date: 12/06/21 Ind with an HEP. Baseline: Goal status: INITIAL   LONG TERM GOALS: Target date: 10/19/22  Golf with no pain. Baseline:  Goal status: INITIAL  2.  Sit to stand with no pain. Baseline:  Goal status: INITIAL  3.  No palpable right proximal musculature tenderness. Baseline:  Goal status: INITIAL  PLAN:  PT FREQUENCY: 2x/week  PT DURATION: 4 weeks  PLANNED INTERVENTIONS: Therapeutic exercises, Therapeutic activity, Patient/Family education, Self Care, Dry Needling, Electrical stimulation, Cryotherapy, Moist heat, Taping, Vasopneumatic device, Ultrasound, and Manual therapy  PLAN FOR NEXT SESSION: Combo e'stim/US, STW/M, Nustep/Recumbent bike, pain-free there ex.  Marvell Fuller, PTA 10/05/2022, 12:03 PM

## 2022-10-05 NOTE — Progress Notes (Signed)
   Acute Office Visit  Subjective:     Patient ID: Dorothy Pitts, female    DOB: 05/20/1941, 81 y.o.   MRN: [id number]  Chief Complaint  Patient presents with   Dysuria    Dysuria  This is a new problem. Episode onset: 1 week. The problem occurs intermittently. The problem has been unchanged. The quality of the pain is described as burning. There has been no fever. She is Not sexually active. Associated symptoms include frequency and urgency. Pertinent negatives include no chills, discharge, flank pain, hematuria, hesitancy, nausea, possible pregnancy, sweats or vomiting. She has tried nothing for the symptoms. The treatment provided no relief. Her past medical history is significant for recurrent UTIs.     Review of Systems  Constitutional:  Negative for chills.  Gastrointestinal:  Negative for nausea and vomiting.  Genitourinary:  Positive for dysuria, frequency and urgency. Negative for flank pain, hematuria and hesitancy.        Objective:    BP (!) 118/58   Pulse 71   Temp 98.5 F (36.9 C) (Temporal)   Ht 5\' 1"  (1.549 m)   Wt 116 lb (52.6 kg)   SpO2 99%   BMI 21.92 kg/m    Physical Exam Vitals and nursing note reviewed.  Constitutional:      General: She is not in acute distress.    Appearance: She is not ill-appearing, toxic-appearing or diaphoretic.  Cardiovascular:     Heart sounds: Normal heart sounds. No murmur heard. Pulmonary:     Effort: Pulmonary effort is normal. No respiratory distress.     Breath sounds: Normal breath sounds.  Abdominal:     General: Bowel sounds are normal. There is no distension.     Palpations: Abdomen is soft.     Tenderness: There is no abdominal tenderness. There is no right CVA tenderness, left CVA tenderness, guarding or rebound.  Musculoskeletal:     Right lower leg: No edema.     Left lower leg: No edema.  Skin:    General: Skin is warm and dry.  Neurological:     General: No focal deficit present.     Mental  Status: She is alert and oriented to person, place, and time.  Psychiatric:        Mood and Affect: Mood normal.        Behavior: Behavior normal.     Urine dipstick shows positive for RBC's, positive for glucose, and positive for leukocytes.  Micro exam: 6-10 WBC's per HPF and few+ bacteria.       Assessment & Plan:   Jhada was seen today for dysuria.  Diagnoses and all orders for this visit:  Acute cystitis without hematuria Possible UTI based on UA and symptoms. Culture pending. Bactrim as below pending culture as she does not tolerate PCN, cephalosporins or cipro. Return to office for new or worsening symptoms, or if symptoms persist.  -     Urinalysis, Routine w reflex microscopic -     Urine Culture -     sulfamethoxazole-trimethoprim (BACTRIM DS) 800-160 MG tablet; Take 1 tablet by mouth 2 (two) times daily for 6 days.  The patient indicates understanding of these issues and agrees with the plan.  Bosie Clos, FNP

## 2022-10-05 NOTE — Progress Notes (Signed)
Pt tolerated prolia well

## 2022-10-08 LAB — URINE CULTURE

## 2022-10-29 ENCOUNTER — Other Ambulatory Visit: Payer: Self-pay | Admitting: Family Medicine

## 2022-10-29 DIAGNOSIS — M810 Age-related osteoporosis without current pathological fracture: Secondary | ICD-10-CM

## 2022-11-02 ENCOUNTER — Other Ambulatory Visit: Payer: Self-pay | Admitting: *Deleted

## 2022-11-02 MED ORDER — ALENDRONATE SODIUM 70 MG PO TABS: 70.0000 mg | ORAL_TABLET | ORAL | 11 refills | Status: DC

## 2022-11-02 NOTE — Progress Notes (Signed)
R/c

## 2022-12-21 ENCOUNTER — Telehealth: Payer: Self-pay | Admitting: Family Medicine

## 2022-12-21 DIAGNOSIS — G8929 Other chronic pain: Secondary | ICD-10-CM

## 2022-12-21 NOTE — Addendum Note (Signed)
Addended by: Milas Hock on: 12/21/2022 02:18 PM   Modules accepted: Orders

## 2022-12-21 NOTE — Telephone Encounter (Signed)
Referral placed and pt is aware. 

## 2022-12-21 NOTE — Telephone Encounter (Signed)
Pt only went 3 times for PT but is now wanting to go back. Does she ntbs or can I just place a new referral?

## 2022-12-21 NOTE — Telephone Encounter (Signed)
Ok to just place new referral. Thank you!

## 2022-12-31 ENCOUNTER — Encounter: Payer: Self-pay | Admitting: Physical Therapy

## 2022-12-31 ENCOUNTER — Ambulatory Visit: Payer: Medicare Other | Attending: Family Medicine | Admitting: Physical Therapy

## 2022-12-31 DIAGNOSIS — G8929 Other chronic pain: Secondary | ICD-10-CM | POA: Insufficient documentation

## 2022-12-31 DIAGNOSIS — M79651 Pain in right thigh: Secondary | ICD-10-CM | POA: Diagnosis not present

## 2022-12-31 DIAGNOSIS — R1031 Right lower quadrant pain: Secondary | ICD-10-CM | POA: Diagnosis not present

## 2022-12-31 NOTE — Therapy (Signed)
OUTPATIENT PHYSICAL THERAPY LOWER EXTREMITY RE-EVALUATION   Patient Name: Dorothy Pitts MRN: [id number] DOB:1941/04/23, 82 y.o., female Today's Date: 12/31/2022  END OF SESSION:  PT End of Session - 12/31/22 1420     Visit Number 1    Number of Visits 6    Date for PT Re-Evaluation 01/28/23    PT Start Time 0147    Activity Tolerance Patient tolerated treatment well    Behavior During Therapy [facility] for tasks assessed/performed             Past Medical History:  Diagnosis Date   Acute diverticulitis 10/07/2013   With microperforation.   Anemia 10/08/2013   Bowel obstruction (HCC)    Cataract    Diverticulosis of colon (without mention of hemorrhage)    GERD (gastroesophageal reflux disease)    Hemorrhoids    HOH (hard of hearing)    Hyperlipidemia    Hypertension    IBS (irritable bowel syndrome)    Osteoporosis    PONV (postoperative nausea and vomiting)    Thoracic aortic atherosclerosis ([place])    UTI (lower urinary tract infection) 10/07/2013   Vitamin D deficiency    Past Surgical History:  Procedure Laterality Date   ABDOMINAL HYSTERECTOMY     APPENDECTOMY     BILATERAL OOPHORECTOMY     BREAST LUMPECTOMY Left    CATARACT EXTRACTION     COLON RESECTION N/A 12/01/2013   Procedure: HAND ASSISTED LAPAROSCOPIC PARTIAL COLECTOMY;  Surgeon: Jamesetta So, MD;  Location: [facility];  Service: General;  Laterality: N/A;   [place]  09/04/2016   LAPAROSCOPIC VENTRAL INCISIONAL HERNIA REPAIR POSSIBLE OPEN (N/A)   INCISIONAL HERNIA REPAIR N/A 09/04/2016   Procedure: LAPAROSCOPIC VENTRAL INCISIONAL HERNIA REPAIR POSSIBLE OPEN;  Surgeon: Alphonsa Overall, MD;  Location: [place];  Service: General;  Laterality: N/A;   INSERTION OF MESH N/A 09/04/2016   Procedure: INSERTION OF MESH;  Surgeon: Alphonsa Overall, MD;  Location: [place];  Service: General;  Laterality: N/A;   PARTIAL HYSTERECTOMY     TONSILLECTOMY     Patient Active Problem  List   Diagnosis Date Noted   Postmenopausal 09/09/2022   Decreased hearing of both ears 11/03/2019   Primary insomnia 11/03/2019   Groin pain, chronic, right 04/24/2019   Pain in joint of right ankle 12/22/2017   Pre-diabetes 11/28/2015   Hyperlipidemia 08/28/2015   Thoracic aortic atherosclerosis ([place]) 04/15/2015   Osteoporosis 11/07/2014   Diverticulosis of colon    Essential hypertension    Irritable bowel syndrome with constipation     REFERRING PROVIDER: Marjorie Smolder   REFERRING DIAG: Groin pain, chronic right.  THERAPY DIAG:  Pain in right thigh  Rationale for Evaluation and Treatment: Rehabilitation  ONSET DATE: ~11/22.  SUBJECTIVE:   SUBJECTIVE STATEMENT: The patient returns to the clinic today with c/o right groin pain.  She was last seen on 10/05/22.  She states she is better overall and less frequent overall.  She will still get pain when rising form a seated position and then standing. Pain will radiate to right groin region and will subside after 4 to 5 steps.  PERTINENT HISTORY: OP, OA, hernia repair. PAIN:  Are you having pain? No  PRECAUTIONS: None  WEIGHT BEARING RESTRICTIONS: No  FALLS:  Has patient fallen in last 6 months? No   OCCUPATION: Retired.  PLOF: Independent  PATIENT GOALS: Not hav pain when going from sit to  stand.   OBJECTIVE:   POSTURE: No Significant postural limitations  PALPATION: Continued tenderness over right proximal adductor.  LOWER EXTREMITY ROM: WNL for right LE and WFL for active lumbar flexion.  LOWER EXTREMITY MMT:  No LE strength deficits noted.  LOWER EXTREMITY SPECIAL TESTS:  Negative right hip Scour and FABER test.   GAIT: WNL.   TODAY'S TREATMENT:                                                                                                                              DATE: HMP and IFC at 80-150 Hz on 40% scan x 15 minutes to patient's right proximal adductor region.   Patient tolerated  treatment without complaint with normal modality response following removal of modality.    ASSESSMENT:  CLINICAL IMPRESSION: The patient returns with c/o right groin pain upon standing from a seated position.  Chief finding today is palpable pain over her proximal right adductor region.  She has normal right LE strength and special testing is normal.   Patient will benefit from skilled physical therapy intervention to address pain and deficits.  OBJECTIVE IMPAIRMENTS: pain.   ACTIVITY LIMITATIONS: transfers  REHAB POTENTIAL: Excellent  CLINICAL DECISION MAKING: Stable/uncomplicated  EVALUATION COMPLEXITY: Low   GOALS:  LONG TERM GOALS: Target date: 01/28/23.  Ind with an advanced HEP.  Goal status: INITIAL  2.  Sit to stand with pain not > 2/10.  Goal status: INITIAL   PLAN:  PT FREQUENCY: 2x/week  PT DURATION: 3 weeks  PLANNED INTERVENTIONS: Therapeutic exercises, Therapeutic activity, Patient/Family education, Self Care, Cryotherapy, Moist heat, Ultrasound, and Manual therapy  PLAN FOR NEXT SESSION: Continue therex, modalities and STW/M as needed.   Montie Swiderski, [place], PT 12/31/2022, 2:21 PM

## 2023-01-04 ENCOUNTER — Ambulatory Visit: Payer: Medicare Other | Admitting: Physical Therapy

## 2023-01-11 ENCOUNTER — Ambulatory Visit (INDEPENDENT_AMBULATORY_CARE_PROVIDER_SITE_OTHER): Payer: Medicare Other

## 2023-01-11 VITALS — Ht 61.0 in | Wt 117.0 lb

## 2023-01-11 DIAGNOSIS — Z0000 Encounter for general adult medical examination without abnormal findings: Secondary | ICD-10-CM | POA: Diagnosis not present

## 2023-01-11 NOTE — Patient Instructions (Signed)
Dorothy Pitts , Thank you for taking time to come for your Medicare Wellness Visit. I appreciate your ongoing commitment to your health goals. Please review the following plan we discussed and let me know if I can assist you in the future.   These are the goals we discussed:  Goals      AWV     01/06/2021 AWV Goal: Fall Prevention  Over the next year, patient will decrease their risk for falls by: Using assistive devices, such as a cane or walker, as needed Identifying fall risks within their home and correcting them by: Removing throw rugs Adding handrails to stairs or ramps Removing clutter and keeping a clear pathway throughout the home Increasing light, especially at night Adding shower handles/bars Raising toilet seat Identifying potential personal risk factors for falls: Medication side effects Incontinence/urgency Vestibular dysfunction Hearing loss Musculoskeletal disorders Neurological disorders Orthostatic hypotension       DIET - INCREASE WATER INTAKE     Try to drink 6-8 glasses of water daily     Exercise 3x per week (30 min per time)     Walking is a great option.         This is a list of the screening recommended for you and due dates:  Health Maintenance  Topic Date Due   Zoster (Shingles) Vaccine (1 of 2) Never done   Flu Shot  01/24/2023*   Pneumonia Vaccine (1 of 1 - PCV) 02/18/2023*   Medicare Annual Wellness Visit  01/11/2024   DEXA scan (bone density measurement)  10/06/2024   DTaP/Tdap/Td vaccine (3 - Td or Tdap) 12/31/2030   HPV Vaccine  Aged Out   COVID-19 Vaccine  Discontinued  *Topic was postponed. The date shown is not the original due date.    Advanced directives: Please bring a copy of your health care power of attorney and living will to the office to be added to your chart at your convenience.   Conditions/risks identified: Aim for 30 minutes of exercise or brisk walking, 6-8 glasses of water, and 5 servings of fruits and vegetables  each day.   Next appointment: Follow up in one year for your annual wellness visit    Preventive Care 65 Years and Older, Female Preventive care refers to lifestyle choices and visits with your health care provider that can promote health and wellness. What does preventive care include? A yearly physical exam. This is also called an annual well check. Dental exams once or twice a year. Routine eye exams. Ask your health care provider how often you should have your eyes checked. Personal lifestyle choices, including: Daily care of your teeth and gums. Regular physical activity. Eating a healthy diet. Avoiding tobacco and drug use. Limiting alcohol use. Practicing safe sex. Taking low-dose aspirin every day. Taking vitamin and mineral supplements as recommended by your health care provider. What happens during an annual well check? The services and screenings done by your health care provider during your annual well check will depend on your age, overall health, lifestyle risk factors, and family history of disease. Counseling  Your health care provider may ask you questions about your: Alcohol use. Tobacco use. Drug use. Emotional well-being. Home and relationship well-being. Sexual activity. Eating habits. History of falls. Memory and ability to understand (cognition). Work and work Statistician. Reproductive health. Screening  You may have the following tests or measurements: Height, weight, and BMI. Blood pressure. Lipid and cholesterol levels. These may be checked every 5 years, or more  frequently if you are over 64 years old. Skin check. Lung cancer screening. You may have this screening every year starting at age 27 if you have a 30-pack-year history of smoking and currently smoke or have quit within the past 15 years. Fecal occult blood test (FOBT) of the stool. You may have this test every year starting at age 22. Flexible sigmoidoscopy or colonoscopy. You may have a  sigmoidoscopy every 5 years or a colonoscopy every 10 years starting at age 62. Hepatitis C blood test. Hepatitis B blood test. Sexually transmitted disease (STD) testing. Diabetes screening. This is done by checking your blood sugar (glucose) after you have not eaten for a while (fasting). You may have this done every 1-3 years. Bone density scan. This is done to screen for osteoporosis. You may have this done starting at age 46. Mammogram. This may be done every 1-2 years. Talk to your health care provider about how often you should have regular mammograms. Talk with your health care provider about your test results, treatment options, and if necessary, the need for more tests. Vaccines  Your health care provider may recommend certain vaccines, such as: Influenza vaccine. This is recommended every year. Tetanus, diphtheria, and acellular pertussis (Tdap, Td) vaccine. You may need a Td booster every 10 years. Zoster vaccine. You may need this after age 41. Pneumococcal 13-valent conjugate (PCV13) vaccine. One dose is recommended after age 63. Pneumococcal polysaccharide (PPSV23) vaccine. One dose is recommended after age 75. Talk to your health care provider about which screenings and vaccines you need and how often you need them. This information is not intended to replace advice given to you by your health care provider. Make sure you discuss any questions you have with your health care provider. Document Released: 11/08/2015 Document Revised: 07/01/2016 Document Reviewed: 08/13/2015 Elsevier Interactive Patient Education  2017 [place] Prevention in the Home Falls can cause injuries. They can happen to people of all ages. There are many things you can do to make your home safe and to help prevent falls. What can I do on the outside of my home? Regularly fix the edges of walkways and driveways and fix any cracks. Remove anything that might make you trip as you walk through a  door, such as a raised step or threshold. Trim any bushes or trees on the path to your home. Use bright outdoor lighting. Clear any walking paths of anything that might make someone trip, such as rocks or tools. Regularly check to see if handrails are loose or broken. Make sure that both sides of any steps have handrails. Any raised decks and porches should have guardrails on the edges. Have any leaves, snow, or ice cleared regularly. Use sand or salt on walking paths during winter. Clean up any spills in your garage right away. This includes oil or grease spills. What can I do in the bathroom? Use night lights. Install grab bars by the toilet and in the tub and shower. Do not use towel bars as grab bars. Use non-skid mats or decals in the tub or shower. If you need to sit down in the shower, use a plastic, non-slip stool. Keep the floor dry. Clean up any water that spills on the floor as soon as it happens. Remove soap buildup in the tub or shower regularly. Attach bath mats securely with double-sided non-slip rug tape. Do not have throw rugs and other things on the floor that can make you trip. What can  I do in the bedroom? Use night lights. Make sure that you have a light by your bed that is easy to reach. Do not use any sheets or blankets that are too big for your bed. They should not hang down onto the floor. Have a firm chair that has side arms. You can use this for support while you get dressed. Do not have throw rugs and other things on the floor that can make you trip. What can I do in the kitchen? Clean up any spills right away. Avoid walking on wet floors. Keep items that you use a lot in easy-to-reach places. If you need to reach something above you, use a strong step stool that has a grab bar. Keep electrical cords out of the way. Do not use floor polish or wax that makes floors slippery. If you must use wax, use non-skid floor wax. Do not have throw rugs and other things  on the floor that can make you trip. What can I do with my stairs? Do not leave any items on the stairs. Make sure that there are handrails on both sides of the stairs and use them. Fix handrails that are broken or loose. Make sure that handrails are as long as the stairways. Check any carpeting to make sure that it is firmly attached to the stairs. Fix any carpet that is loose or worn. Avoid having throw rugs at the top or bottom of the stairs. If you do have throw rugs, attach them to the floor with carpet tape. Make sure that you have a light switch at the top of the stairs and the bottom of the stairs. If you do not have them, ask someone to add them for you. What else can I do to help prevent falls? Wear shoes that: Do not have high heels. Have rubber bottoms. Are comfortable and fit you well. Are closed at the toe. Do not wear sandals. If you use a stepladder: Make sure that it is fully opened. Do not climb a closed stepladder. Make sure that both sides of the stepladder are locked into place. Ask someone to hold it for you, if possible. Clearly mark and make sure that you can see: Any grab bars or handrails. First and last steps. Where the edge of each step is. Use tools that help you move around (mobility aids) if they are needed. These include: Canes. Walkers. Scooters. Crutches. Turn on the lights when you go into a dark area. Replace any light bulbs as soon as they burn out. Set up your furniture so you have a clear path. Avoid moving your furniture around. If any of your floors are uneven, fix them. If there are any pets around you, be aware of where they are. Review your medicines with your doctor. Some medicines can make you feel dizzy. This can increase your chance of falling. Ask your doctor what other things that you can do to help prevent falls. This information is not intended to replace advice given to you by your health care provider. Make sure you discuss any  questions you have with your health care provider. Document Released: 08/08/2009 Document Revised: 03/19/2016 Document Reviewed: 11/16/2014 Elsevier Interactive Patient Education  2017 [employer].

## 2023-01-11 NOTE — Progress Notes (Signed)
Subjective:   Dorothy Pitts is a 82 y.o. female who presents for Medicare Annual (Subsequent) preventive examination. I connected with  Dorothy Pitts on 01/11/23 by a audio enabled telemedicine application and verified that I am speaking with the correct person using two identifiers.  Patient Location: Home  Provider Location: Home Office  I discussed the limitations of evaluation and management by telemedicine. The patient expressed understanding and agreed to proceed.  Review of Systems     Cardiac Risk Factors include: advanced age (>45men, >27 women);dyslipidemia     Objective:    Today's Vitals   01/11/23 1420  Weight: 117 lb (53.1 kg)  Height: 5\' 1"  (1.549 m)   Body mass index is 22.11 kg/m.     01/11/2023    2:25 PM 09/21/2022   11:53 AM 01/07/2022    4:08 PM 09/30/2021   12:27 PM 01/06/2021    3:00 PM 09/04/2019    9:58 AM 08/23/2018    8:19 AM  Advanced Directives  Does Patient Have a Medical Advance Directive? Yes Yes No Yes Yes No No  Type of Paramedic of [place];Living will    [place];Living will    Does patient want to make changes to medical advance directive?     No - Patient declined    Copy of [place] in Chart? No - copy requested    No - copy requested    Would patient like information on creating a medical advance directive?   No - Patient declined   No - Patient declined Yes (MAU/Ambulatory/Procedural Areas - Information given)    Current Medications (verified) Outpatient Encounter Medications as of 01/11/2023  Medication Sig   alendronate (FOSAMAX) 70 MG tablet Take 1 tablet (70 mg total) by mouth every 7 (seven) days. Take with a full glass of water on an empty stomach.   Ascorbic Acid (VITAMIN C) 1000 MG tablet Take 1,000 mg by mouth daily. Reported on 11/21/2015   Calcium Carbonate-Vitamin D (CALTRATE 600+D) 600-400 MG-UNIT tablet Take 1 tablet by mouth daily.    Cholecalciferol (VITAMIN D3) 5000 units CAPS Take 1 capsule by mouth every Monday, Wednesday, and Friday.   denosumab (PROLIA) 60 MG/ML SOSY injection Inject 60 mg into the skin every 6 (six) months.   enalapril (VASOTEC) 5 MG tablet Take 1 tablet (5 mg total) by mouth daily.   ezetimibe (ZETIA) 10 MG tablet Take 1 tablet (10 mg total) by mouth daily.   famotidine-calcium carbonate-magnesium hydroxide (PEPCID COMPLETE) 10-800-165 MG chewable tablet Chew 1 tablet by mouth daily.   loratadine (CLARITIN) 10 MG tablet Take 10 mg by mouth daily.   nystatin cream (MYCOSTATIN) Apply 1 application topically 2 (two) times daily.   Polyethylene Glycol 3350 (MIRALAX PO) Take 1 Dose by mouth daily.   Probiotic Product (ALIGN PO) Take 1 tablet by mouth daily.   spironolactone (ALDACTONE) 50 MG tablet Take 1 tablet (50 mg total) by mouth daily.   triamcinolone cream (KENALOG) 0.1 % APPLY TO AFFECTED AREA TWICE A DAY   vitamin B-12 (CYANOCOBALAMIN) 1000 MCG tablet Take 1,000 mcg by mouth daily.   vitamin k 100 MCG tablet Take 100 mcg by mouth daily.   No facility-administered encounter medications on file as of 01/11/2023.    Allergies (verified) Tolmetin, Actonel [risedronate sodium], Docosahexaenoic acid (dha), Fosamax [alendronate sodium], Crestor [rosuvastatin calcium], Nsaids, Other, Augmentin [amoxicillin-pot clavulanate], Cephalexin, Cephalosporins, Ciprofloxacin, and Lovaza [omega-3-acid ethyl esters]   History: Past  Medical History:  Diagnosis Date   Acute diverticulitis 10/07/2013   With microperforation.   Anemia 10/08/2013   Bowel obstruction (HCC)    Cataract    Diverticulosis of colon (without mention of hemorrhage)    GERD (gastroesophageal reflux disease)    Hemorrhoids    HOH (hard of hearing)    Hyperlipidemia    Hypertension    IBS (irritable bowel syndrome)    Osteoporosis    PONV (postoperative nausea and vomiting)    Thoracic aortic atherosclerosis ([place])    UTI (lower  urinary tract infection) 10/07/2013   Vitamin D deficiency    Past Surgical History:  Procedure Laterality Date   ABDOMINAL HYSTERECTOMY     APPENDECTOMY     BILATERAL OOPHORECTOMY     BREAST LUMPECTOMY Left    CATARACT EXTRACTION     COLON RESECTION N/A 12/01/2013   Procedure: HAND ASSISTED LAPAROSCOPIC PARTIAL COLECTOMY;  Surgeon: Jamesetta So, MD;  Location: [facility];  Service: General;  Laterality: N/A;   [place]  09/04/2016   LAPAROSCOPIC VENTRAL INCISIONAL HERNIA REPAIR POSSIBLE OPEN (N/A)   INCISIONAL HERNIA REPAIR N/A 09/04/2016   Procedure: LAPAROSCOPIC VENTRAL INCISIONAL HERNIA REPAIR POSSIBLE OPEN;  Surgeon: Alphonsa Overall, MD;  Location: [place];  Service: General;  Laterality: N/A;   INSERTION OF MESH N/A 09/04/2016   Procedure: INSERTION OF MESH;  Surgeon: Alphonsa Overall, MD;  Location: [facility];  Service: General;  Laterality: N/A;   PARTIAL HYSTERECTOMY     TONSILLECTOMY     Family History  Problem Relation Age of Onset   Hypertension Mother    Heart attack Mother 45   Parkinson's disease Father    Heart disease Brother 55       bypass   Obesity Brother    Asthma Son    Other Son        Gout   Colon cancer Neg Hx    Social History   Socioeconomic History   Marital status: Widowed    Spouse name: Not on file   Number of children: 3   Years of education: 15   Highest education level: Some college, no degree  Occupational History   Occupation: retired    Fish farm manager: RETIRED  Tobacco Use   Smoking status: Former    Packs/day: 1.00    Years: 35.00    Additional pack years: 0.00    Total pack years: 35.00    Types: Cigarettes    Quit date: 10/26/2002    Years since quitting: 20.2   Smokeless tobacco: Never  Vaping Use   Vaping Use: Never used  Substance and Sexual Activity   Alcohol use: Yes    Alcohol/week: 4.0 standard drinks of alcohol    Types: 4 Glasses of wine per week    Comment: wine   Drug use: No    Sexual activity: Not Currently  Other Topics Concern   Not on file  Social History Narrative   Son and grandson live with her for now - 2023   Social Determinants of Health   Financial Resource Strain: Low Risk  (01/11/2023)   Overall Financial Resource Strain (CARDIA)    Difficulty of Paying Living Expenses: Not hard at all  Food Insecurity: No Food Insecurity (01/11/2023)   Hunger Vital Sign    Worried About Running Out of Food in the Last Year: Never true    Ran Out of Food in the  Last Year: Never true  Transportation Needs: No Transportation Needs (01/11/2023)   PRAPARE - Hydrologist (Medical): No    Lack of Transportation (Non-Medical): No  Physical Activity: Sufficiently Active (01/11/2023)   Exercise Vital Sign    Days of Exercise per Week: 5 days    Minutes of Exercise per Session: 30 min  Stress: No Stress Concern Present (01/11/2023)   [place]    Feeling of Stress : Not at all  Social Connections: Moderately Integrated (01/11/2023)   Social Connection and Isolation Panel [NHANES]    Frequency of Communication with Friends and Family: More than three times a week    Frequency of Social Gatherings with Friends and Family: More than three times a week    Attends Religious Services: More than 4 times per year    Active Member of [employer] or Organizations: Yes    Attends Archivist Meetings: More than 4 times per year    Marital Status: Widowed    Tobacco Counseling Counseling given: Not Answered   Clinical Intake:  Pre-visit preparation completed: Yes  Pain : No/denies pain     Nutritional Risks: None Diabetes: No  How often do you need to have someone help you when you read instructions, pamphlets, or other written materials from your doctor or pharmacy?: 1 - Never  Diabetic?no   Interpreter Needed?: No  Information entered by :: Jadene Pierini,  LPN   Activities of Daily Living    01/11/2023    2:25 PM  In your present state of health, do you have any difficulty performing the following activities:  Hearing? 0  Vision? 0  Difficulty concentrating or making decisions? 0  Walking or climbing stairs? 0  Dressing or bathing? 0  Doing errands, shopping? 0  Preparing Food and eating ? N  Using the Toilet? N  In the past six months, have you accidently leaked urine? N  Do you have problems with loss of bowel control? N  Managing your Medications? N  Managing your Finances? N  Housekeeping or managing your Housekeeping? N    Patient Care Team: Gwenlyn Perking, FNP as PCP - General (Family Medicine) Pyrtle, Lajuan Lines, MD as Consulting Physician (Gastroenterology) Vania Rea, MD as Consulting Physician (Obstetrics and Gynecology) Clovis Riley, MD as Consulting Physician (General Surgery)  Indicate any recent Medical Services you may have received from other than [facility] providers in the past year (date may be approximate).     Assessment:   This is a routine wellness examination for Asta.  Hearing/Vision screen Vision Screening - Comments:: Wears rx glasses - up to date with routine eye exams with Dr.Groat   Dietary issues and exercise activities discussed:     Goals Addressed             This Visit's Progress    DIET - INCREASE WATER INTAKE   On track    Try to drink 6-8 glasses of water daily     Exercise 3x per week (30 min per time)   On track    Walking is a great option.        Depression Screen    01/11/2023    2:24 PM 10/05/2022    3:17 PM 09/09/2022    3:17 PM 08/19/2022   11:13 AM 02/17/2022   11:07 AM 01/07/2022    4:06 PM 08/04/2021    3:48 PM  PHQ 2/9 Scores  PHQ - 2 Score 0 0 0 0 0 0 0  PHQ- 9 Score  0 0 0 0      Fall Risk    01/11/2023    2:21 PM 10/05/2022    3:17 PM 09/09/2022    3:18 PM 08/19/2022   11:13 AM 02/17/2022   11:07 AM  [place] in the past year? 0 0 0  0 0  Number falls in past yr: 0      Injury with Fall? 0      Risk for fall due to : No Fall Risks      Follow up Falls prevention discussed        [place]:  Any stairs in or around the home? Yes  If so, are there any without handrails? No  Home free of loose throw rugs in walkways, pet beds, electrical cords, etc? Yes  Adequate lighting in your home to reduce risk of falls? Yes   ASSISTIVE DEVICES UTILIZED TO PREVENT FALLS:  Life alert? No  Use of a cane, walker or w/c? No  Grab bars in the bathroom? Yes  Shower chair or bench in shower? No  Elevated toilet seat or a handicapped toilet? No       08/23/2018    8:18 AM 11/21/2015    9:29 AM  MMSE - Mini Mental State Exam  Orientation to time 5 5  Orientation to Place 5 5  Registration 3 3  Attention/ Calculation 5 5  Recall 3 3  Language- name 2 objects 2 2  Language- repeat 1 1  Language- follow 3 step command 3 3  Language- read & follow direction 1 1  Write a sentence 1 1  Copy design 1 1  Total score 30 30        01/11/2023    2:26 PM 01/06/2021    3:03 PM 09/04/2019   10:03 AM  6CIT Screen  What Year? 0 points 0 points 0 points  What month? 0 points 0 points 0 points  What time? 0 points 0 points 0 points  Count back from 20 0 points 0 points 0 points  Months in reverse 0 points 0 points 0 points  Repeat phrase 0 points 0 points 0 points  Total Score 0 points 0 points 0 points    Immunizations Immunization History  Administered Date(s) Administered   Td 04/19/2006   Tdap 12/30/2020   Zoster, Live 12/24/2009    TDAP status: Up to date  Flu Vaccine status: Declined, Education has been provided regarding the importance of this vaccine but patient still declined. Advised may receive this vaccine at local pharmacy or Health Dept. Aware to provide a copy of the vaccination record if obtained from local pharmacy or Health Dept. Verbalized acceptance and  understanding.  Pneumococcal vaccine status: Due, Education has been provided regarding the importance of this vaccine. Advised may receive this vaccine at local pharmacy or Health Dept. Aware to provide a copy of the vaccination record if obtained from local pharmacy or Health Dept. Verbalized acceptance and understanding.  Covid-19 vaccine status: Completed vaccines  Qualifies for Shingles Vaccine? Yes   Zostavax completed No   Shingrix Completed?: No.    Education has been provided regarding the importance of this vaccine. Patient has been advised to call insurance company to determine out of pocket expense if they have not yet received this vaccine. Advised may also receive vaccine at local pharmacy  or Health Dept. Verbalized acceptance and understanding.  Screening Tests Health Maintenance  Topic Date Due   Zoster Vaccines- Shingrix (1 of 2) Never done   INFLUENZA VACCINE  01/24/2023 (Originally 05/26/2022)   Pneumonia Vaccine 24+ Years old (1 of 1 - PCV) 02/18/2023 (Originally 07/09/2006)   Medicare Annual Wellness (AWV)  01/11/2024   DEXA SCAN  10/06/2024   DTaP/Tdap/Td (3 - Td or Tdap) 12/31/2030   HPV VACCINES  Aged Out   COVID-19 Vaccine  Discontinued    Health Maintenance  Health Maintenance Due  Topic Date Due   Zoster Vaccines- Shingrix (1 of 2) Never done    Colorectal cancer screening: No longer required.   Mammogram status: No longer required due to age .  Bone Density status: Completed 10/06/2022. Results reflect: Bone density results: OSTEOPOROSIS. Repeat every 2 years.  Lung Cancer Screening: (Low Dose CT Chest recommended if Age 56-80 years, 30 pack-year currently smoking OR have quit w/in 15years.) does not qualify.   Lung Cancer Screening Referral: n/a  Additional Screening:  Hepatitis C Screening: does not qualify;   Vision Screening: Recommended annual ophthalmology exams for early detection of glaucoma and other disorders of the eye. Is the patient  up to date with their annual eye exam?  Yes  Who is the provider or what is the name of the office in which the patient attends annual eye exams? Dr.Johnson  If pt is not established with a provider, would they like to be referred to a provider to establish care? No .   Dental Screening: Recommended annual dental exams for proper oral hygiene  Community Resource Referral / Chronic Care Management: CRR required this visit?  No   CCM required this visit?  No      Plan:     I have personally reviewed and noted the following in the patient's chart:   Medical and social history Use of alcohol, tobacco or illicit drugs  Current medications and supplements including opioid prescriptions. Patient is not currently taking opioid prescriptions. Functional ability and status Nutritional status Physical activity Advanced directives List of other physicians Hospitalizations, surgeries, and ER visits in previous 12 months Vitals Screenings to include cognitive, depression, and falls Referrals and appointments  In addition, I have reviewed and discussed with patient certain preventive protocols, quality metrics, and best practice recommendations. A written personalized care plan for preventive services as well as general preventive health recommendations were provided to patient.     Daphane Shepherd, LPN   [id number]   Nurse Notes: Declines Flu and pneumonia Vaccine

## 2023-02-05 ENCOUNTER — Other Ambulatory Visit: Payer: Self-pay | Admitting: Family Medicine

## 2023-02-05 DIAGNOSIS — I10 Essential (primary) hypertension: Secondary | ICD-10-CM

## 2023-03-02 ENCOUNTER — Encounter: Payer: Self-pay | Admitting: Nurse Practitioner

## 2023-03-02 ENCOUNTER — Ambulatory Visit (INDEPENDENT_AMBULATORY_CARE_PROVIDER_SITE_OTHER): Payer: Medicare Other | Admitting: Nurse Practitioner

## 2023-03-02 VITALS — BP 125/64 | HR 81 | Temp 96.9°F | Resp 20 | Ht 61.0 in | Wt 115.0 lb

## 2023-03-02 DIAGNOSIS — J301 Allergic rhinitis due to pollen: Secondary | ICD-10-CM | POA: Diagnosis not present

## 2023-03-02 MED ORDER — PREDNISONE 20 MG PO TABS
40.0000 mg | ORAL_TABLET | Freq: Every day | ORAL | 0 refills | Status: AC
Start: 2023-03-02 — End: 2023-03-07

## 2023-03-02 MED ORDER — CETIRIZINE HCL 10 MG PO TABS: 10.0000 mg | ORAL_TABLET | Freq: Every day | ORAL | 11 refills | Status: DC

## 2023-03-02 MED ORDER — FLUTICASONE PROPIONATE 50 MCG/ACT NA SUSP: 2.0000 | Freq: Every day | NASAL | 6 refills | Status: DC

## 2023-03-02 NOTE — Progress Notes (Signed)
   Subjective:    Patient ID: KELLEEN VERSER, female    DOB: [birth year], 82 y.o.   MRN: [id number]   Chief Complaint: Cough and Nasal Congestion   Cough This is a new problem. The current episode started in the past 7 days. The problem occurs constantly. The cough is Non-productive. Associated symptoms include nasal congestion and rhinorrhea. Pertinent negatives include no chills, ear congestion, ear pain, sore throat or shortness of breath. Nothing aggravates the symptoms. Risk factors for lung disease include animal exposure. Treatments tried: antihistamine. The treatment provided no relief.    Patient Active Problem List   Diagnosis Date Noted   Postmenopausal 09/09/2022   Decreased hearing of both ears 11/03/2019   Primary insomnia 11/03/2019   Groin pain, chronic, right 04/24/2019   Pain in joint of right ankle 12/22/2017   Pre-diabetes 11/28/2015   Hyperlipidemia 08/28/2015   Thoracic aortic atherosclerosis (HCC) 04/15/2015   Osteoporosis 11/07/2014   Diverticulosis of colon    Essential hypertension    Irritable bowel syndrome with constipation        Review of Systems  Constitutional:  Negative for chills.  HENT:  Positive for congestion and rhinorrhea. Negative for ear pain, sinus pressure, sinus pain and sore throat.   Respiratory:  Positive for cough. Negative for shortness of breath.        Objective:   Physical Exam Vitals reviewed.  Constitutional:      Appearance: Normal appearance.  Cardiovascular:     Rate and Rhythm: Normal rate and regular rhythm.     Heart sounds: Normal heart sounds.  Pulmonary:     Effort: Pulmonary effort is normal.     Breath sounds: Normal breath sounds.  Skin:    General: Skin is warm.  Neurological:     General: No focal deficit present.     Mental Status: She is alert and oriented to person, place, and time.  Psychiatric:        Mood and Affect: Mood normal.        Behavior: Behavior normal.    BP 125/64    Pulse 81   Temp (!) 96.9 F (36.1 C) (Temporal)   Resp 20   Ht 5\' 1"  (1.549 m)   Wt 115 lb (52.2 kg)   SpO2 97%   BMI 21.73 kg/m         Assessment & Plan:  MELANY BLAYDES in today with chief complaint of Cough and Nasal Congestion   1. Seasonal allergic rhinitis due to pollen Avoid allergens Continue cough lozgenses RTO prn - cetirizine (ZYRTEC) 10 MG tablet; Take 1 tablet (10 mg total) by mouth daily.  Dispense: 30 tablet; Refill: 11 - fluticasone (FLONASE) 50 MCG/ACT nasal spray; Place 2 sprays into both nostrils daily.  Dispense: 16 g; Refill: 6 - predniSONE (DELTASONE) 20 MG tablet; Take 2 tablets (40 mg total) by mouth daily with breakfast for 5 days. 2 po daily for 5 days  Dispense: 10 tablet; Refill: 0    The above assessment and management plan was discussed with the patient. The patient verbalized understanding of and has agreed to the management plan. Patient is aware to call the clinic if symptoms persist or worsen. Patient is aware when to return to the clinic for a follow-up visit. Patient educated on when it is appropriate to go to the emergency department.   Mary-Margaret Daphine Deutscher, FNP

## 2023-03-05 ENCOUNTER — Telehealth: Payer: Self-pay | Admitting: Family Medicine

## 2023-03-05 NOTE — Telephone Encounter (Signed)
Can we please check benefits for Prolia.  Injection Due Date:  04/07/2023 

## 2023-03-09 ENCOUNTER — Telehealth: Payer: Self-pay

## 2023-03-09 NOTE — Telephone Encounter (Signed)
Prolia VOB initiated via [email or web address] 

## 2023-03-09 NOTE — Telephone Encounter (Signed)
Created new encounter for Prolia BIV. Will route encounter back once benefit verification is complete. 

## 2023-03-10 ENCOUNTER — Ambulatory Visit (INDEPENDENT_AMBULATORY_CARE_PROVIDER_SITE_OTHER): Payer: Medicare Other | Admitting: Family Medicine

## 2023-03-10 ENCOUNTER — Encounter: Payer: Self-pay | Admitting: Family Medicine

## 2023-03-10 VITALS — BP 92/46 | HR 82 | Temp 97.9°F | Ht 61.0 in | Wt 117.1 lb

## 2023-03-10 DIAGNOSIS — R21 Rash and other nonspecific skin eruption: Secondary | ICD-10-CM | POA: Diagnosis not present

## 2023-03-10 DIAGNOSIS — R7303 Prediabetes: Secondary | ICD-10-CM | POA: Diagnosis not present

## 2023-03-10 DIAGNOSIS — E782 Mixed hyperlipidemia: Secondary | ICD-10-CM

## 2023-03-10 DIAGNOSIS — I10 Essential (primary) hypertension: Secondary | ICD-10-CM | POA: Diagnosis not present

## 2023-03-10 LAB — BAYER DCA HB A1C WAIVED: HB A1C (BAYER DCA - WAIVED): 5.9 % — ABNORMAL HIGH (ref 4.8–5.6)

## 2023-03-10 MED ORDER — TRIAMCINOLONE ACETONIDE 0.5 % EX OINT
1.0000 | TOPICAL_OINTMENT | Freq: Two times a day (BID) | CUTANEOUS | 0 refills | Status: DC
Start: 2023-03-10 — End: 2023-09-13

## 2023-03-10 MED ORDER — SPIRONOLACTONE 50 MG PO TABS: 50.0000 mg | ORAL_TABLET | Freq: Every day | ORAL | 3 refills | Status: DC

## 2023-03-10 NOTE — Progress Notes (Signed)
Established Patient Office Visit  Subjective   Patient ID: Dorothy Pitts, female    DOB: January 06, 1941  Age: 82 y.o. MRN: [id number]  Chief Complaint  Patient presents with   Medical Management of Chronic Issues   Prediabetes    HPI HTN Complaint with meds - Yes Current Medications - spironolactone, enalapril Checking BP at home - 120s/60s Pertinent ROS:  Headache - No Fatigue - No Visual Disturbances - No Chest pain - No Dyspnea - No Palpitations - No LE edema - No  2. HLD Stopped taking zetia for a little while because she wasn't feeling well. She plans to restart this.   3. Prediabtes Last A1c 5.5. Regular. She is active.She has a Materials engineer.   4. Rash She has a linear rash on her chest for the last few weeks. It is itchy. She has used an OTC steroid cream without resolution. No exudate.   Past Medical History:  Diagnosis Date   Acute diverticulitis 10/07/2013   With microperforation.   Anemia 10/08/2013   Bowel obstruction (HCC)    Cataract    Diverticulosis of colon (without mention of hemorrhage)    GERD (gastroesophageal reflux disease)    Hemorrhoids    HOH (hard of hearing)    Hyperlipidemia    Hypertension    IBS (irritable bowel syndrome)    Osteoporosis    PONV (postoperative nausea and vomiting)    Thoracic aortic atherosclerosis (HCC)    UTI (lower urinary tract infection) 10/07/2013   Vitamin D deficiency       ROS As per HPI.    Objective:     BP (!) 92/46   Pulse 82   Temp 97.9 F (36.6 C) (Temporal)   Ht 5\' 1"  (1.549 m)   Wt 117 lb 2 oz (53.1 kg)   SpO2 93%   BMI 22.13 kg/m  BP Readings from Last 3 Encounters:  03/10/23 (!) 92/46  03/02/23 125/64  10/05/22 (!) 118/58      Physical Exam Vitals and nursing note reviewed.  Constitutional:      General: She is not in acute distress.    Appearance: Normal appearance. She is not ill-appearing, toxic-appearing or diaphoretic.  Neck:     Vascular: No  carotid bruit.  Cardiovascular:     Rate and Rhythm: Normal rate and regular rhythm.     Heart sounds: No murmur heard. Pulmonary:     Effort: Pulmonary effort is normal. No respiratory distress.     Breath sounds: Normal breath sounds. No wheezing.  Abdominal:     General: Bowel sounds are normal. There is no distension.     Palpations: Abdomen is soft.     Tenderness: There is no abdominal tenderness. There is no guarding or rebound.  Musculoskeletal:     Cervical back: Neck supple.     Right lower leg: No edema.     Left lower leg: No edema.  Skin:    General: Skin is warm and dry.     Findings: Rash (linear rash to chest, no exudate) present.  Neurological:     General: No focal deficit present.     Mental Status: She is alert and oriented to person, place, and time.  Psychiatric:        Mood and Affect: Mood normal.        Behavior: Behavior normal.      No results found for any visits on 03/10/23.    The ASCVD Risk score (Arnett  DK, et al., 2019) failed to calculate for the following reasons:   The 2019 ASCVD risk score is only valid for ages 92 to 60    Assessment & Plan:   Rolena was seen today for medical management of chronic issues and prediabetes.  Diagnoses and all orders for this visit:  Pre-diabetes A1c 5.9 today. Diet and exercise.  -     Bayer DCA Hb A1c Waived  Mixed hyperlipidemia Labs pending. On zetia.  -     CMP14+EGFR -     Lipid panel  Primary hypertension BP is a little soft, normally well controlled. Reports that she feels fine but hasn't drank much water today. Monitor BP at home, notify for persistent high or low readings.  -     spironolactone (ALDACTONE) 50 MG tablet; Take 1 tablet (50 mg total) by mouth daily.  Rash Consistent with dermatitis. Try kenalog as below.  -     triamcinolone ointment (KENALOG) 0.5 %; Apply 1 Application topically 2 (two) times daily.   Return in about 6 months (around 09/10/2023) for CPE and follow  up. Sooner for new or worsening symptoms.    Gabriel Earing, FNP

## 2023-03-11 LAB — CMP14+EGFR
ALT: 15 IU/L (ref 0–32)
AST: 17 IU/L (ref 0–40)
Albumin/Globulin Ratio: 2 (ref 1.2–2.2)
Albumin: 4 g/dL (ref 3.7–4.7)
Alkaline Phosphatase: 46 IU/L (ref 44–121)
BUN/Creatinine Ratio: 27 (ref 12–28)
BUN: 26 mg/dL (ref 8–27)
Bilirubin Total: 0.3 mg/dL (ref 0.0–1.2)
CO2: 23 mmol/L (ref 20–29)
Calcium: 10.2 mg/dL (ref 8.7–10.3)
Chloride: 100 mmol/L (ref 96–106)
Creatinine, Ser: 0.95 mg/dL (ref 0.57–1.00)
Globulin, Total: 2 g/dL (ref 1.5–4.5)
Glucose: 154 mg/dL — ABNORMAL HIGH (ref 70–99)
Potassium: 4.5 mmol/L (ref 3.5–5.2)
Sodium: 137 mmol/L (ref 134–144)
Total Protein: 6 g/dL (ref 6.0–8.5)
eGFR: 60 mL/min/{1.73_m2} (ref >59)

## 2023-03-11 LAB — LIPID PANEL
Chol/HDL Ratio: 3.5 ratio (ref 0.0–4.4)
Cholesterol, Total: 245 mg/dL — ABNORMAL HIGH (ref 100–199)
HDL: 70 mg/dL (ref >39)
LDL Chol Calc (NIH): 124 mg/dL — ABNORMAL HIGH (ref 0–99)
Triglycerides: 291 mg/dL — ABNORMAL HIGH (ref 0–149)
VLDL Cholesterol Cal: 51 mg/dL — ABNORMAL HIGH (ref 5–40)

## 2023-03-12 NOTE — Telephone Encounter (Signed)
Pt ready for scheduling for PROLIA on or after : 04/07/23  Out-of-pocket cost due at time of visit: $0  Primary:  MEDICARE Prolia co-insurance: 0% Admin fee co-insurance: 0%  Secondary: [employer] [place] Prolia co-insurance:  Admin fee co-insurance:   Medical Benefit Details: Date Benefits were checked: 03/10/23 Deductible: $240 MET OF $240 REQUIRED/ Coinsurance: 0%/ Admin Fee: 0%  Prior Auth: N/A PA# Expiration Date:    Pharmacy benefit: Copay $--- If patient wants fill through the pharmacy benefit please send prescription to:  --- , and include estimated need by date in rx notes. Pharmacy will ship medication directly to the office.  Patient NOT eligible for Prolia Copay Card. Copay Card can make patient's cost as little as $25. Link to apply: [email or web address]://[email or web address]  ** This summary of benefits is an estimation of the patient's out-of-pocket cost. Exact cost may very based on individual plan coverage.

## 2023-03-29 NOTE — Telephone Encounter (Signed)
Appointment scheduled 04/20/23

## 2023-04-19 ENCOUNTER — Encounter: Payer: Self-pay | Admitting: Nurse Practitioner

## 2023-04-19 ENCOUNTER — Ambulatory Visit (INDEPENDENT_AMBULATORY_CARE_PROVIDER_SITE_OTHER): Payer: Medicare Other | Admitting: Nurse Practitioner

## 2023-04-19 VITALS — BP 114/60 | HR 71 | Temp 97.7°F | Ht 61.0 in | Wt 115.4 lb

## 2023-04-19 DIAGNOSIS — S81812A Laceration without foreign body, left lower leg, initial encounter: Secondary | ICD-10-CM | POA: Diagnosis not present

## 2023-04-19 NOTE — Patient Instructions (Signed)
Apply a samll piece of zero form and cover it with nonadherent dressing and warp it with culex

## 2023-04-19 NOTE — Progress Notes (Signed)
   Acute Office Visit  Subjective:     Patient ID: Dorothy Pitts, female    DOB: 07-Jun-1941, 82 y.o.   MRN: [id number]  Chief Complaint  Patient presents with   skin tear    Skin tear on Saturday morning. Pt has wrapped skin    HPI SUBJECTIVE:  82 y.o. female sustained laceration of leg 3 hours ago. Nature of injury: " I was getting out of bed, while putting my shoes on,  my right heel rub against my left leg causing a tear. Tetanus vaccination status reviewed: tetanus re-vaccination not indicated.    ROS Negative unless indicated in HPI    Objective:    BP 114/60   Pulse 71   Temp 97.7 F (36.5 C) (Temporal)   Ht 5\' 1"  (1.549 m)   Wt 115 lb 6.4 oz (52.3 kg)   SpO2 97%   BMI 21.80 kg/m  BP Readings from Last 3 Encounters:  04/19/23 114/60  03/10/23 (!) 92/46  03/02/23 125/64   Wt Readings from Last 3 Encounters:  04/19/23 115 lb 6.4 oz (52.3 kg)  03/10/23 117 lb 2 oz (53.1 kg)  03/02/23 115 lb (52.2 kg)      Physical Exam Patient appears well, vitals are normal. A 4 cm skin tear on lateral aspect of the left leg noted.  Description: clean wound edges, no foreign bodies, flap edge noted. Neurovascular and tendon structures are intact. No results found for any visits on 04/19/23.      Assessment & Plan:  Noninfected skin tear of left lower extremity, initial encounter    ASSESSMENT:  Left leg skin tear as described.  PLAN:  Wound  cleansed, debrided of visible foreign material and necrotic tissue, and Antibiotic ointment and dressing applied.  Wound care instructions provided.  Observe for any signs of infection or other problems.    Return if symptoms worsen or fail to improve.  Richardson Landry DNP

## 2023-04-20 ENCOUNTER — Ambulatory Visit (INDEPENDENT_AMBULATORY_CARE_PROVIDER_SITE_OTHER): Payer: Medicare Other

## 2023-04-20 DIAGNOSIS — M810 Age-related osteoporosis without current pathological fracture: Secondary | ICD-10-CM | POA: Diagnosis not present

## 2023-04-20 MED ORDER — DENOSUMAB 60 MG/ML ~~LOC~~ SOSY
60.0000 mg | PREFILLED_SYRINGE | Freq: Once | SUBCUTANEOUS | Status: AC
Start: 2023-04-20 — End: 2023-04-20
  Administered 2023-04-20: 60 mg via SUBCUTANEOUS

## 2023-04-20 NOTE — Progress Notes (Signed)
Prolia injection given to left upper arm.  Patient tolerated well.  [employer]

## 2023-04-28 ENCOUNTER — Ambulatory Visit (INDEPENDENT_AMBULATORY_CARE_PROVIDER_SITE_OTHER): Payer: Medicare Other | Admitting: Family Medicine

## 2023-04-28 ENCOUNTER — Encounter: Payer: Self-pay | Admitting: Family Medicine

## 2023-04-28 VITALS — BP 128/57 | HR 71 | Temp 96.6°F | Ht 61.0 in | Wt 114.6 lb

## 2023-04-28 DIAGNOSIS — S81812D Laceration without foreign body, left lower leg, subsequent encounter: Secondary | ICD-10-CM | POA: Diagnosis not present

## 2023-04-28 NOTE — Progress Notes (Signed)
Subjective:  Patient ID: Dorothy Pitts, female    DOB: 1941-01-12, 82 y.o.   MRN: [id number]  Patient Care Team: Gabriel Earing, FNP as PCP - General (Family Medicine) Pyrtle, Carie Caddy, MD as Consulting Physician (Gastroenterology) Annamaria Helling, MD as Consulting Physician (Obstetrics and Gynecology) Berna Bue, MD as Consulting Physician (General Surgery)   Chief Complaint:  SKIN TEAR   HPI: Dorothy Pitts is a 82 y.o. female presenting on 04/28/2023 for SKIN TEAR   Left shin tear mid shin 4x2.5 cm. Pt complains that xeroform is sticking to wound during dressing changes and causes bleeding and has lifted the skin flap off of wound bed.  Changes dressing every 3 days.  Relevant past medical, surgical, family, and social history reviewed and updated as indicated.  Allergies and medications reviewed and updated. Data reviewed: Chart in Epic.   Past Medical History:  Diagnosis Date   Acute diverticulitis 10/07/2013   With microperforation.   Anemia 10/08/2013   Bowel obstruction (HCC)    Cataract    Diverticulosis of colon (without mention of hemorrhage)    GERD (gastroesophageal reflux disease)    Hemorrhoids    HOH (hard of hearing)    Hyperlipidemia    Hypertension    IBS (irritable bowel syndrome)    Osteoporosis    PONV (postoperative nausea and vomiting)    Thoracic aortic atherosclerosis (HCC)    UTI (lower urinary tract infection) 10/07/2013   Vitamin D deficiency     Past Surgical History:  Procedure Laterality Date   ABDOMINAL HYSTERECTOMY     APPENDECTOMY     BILATERAL OOPHORECTOMY     BREAST LUMPECTOMY Left    CATARACT EXTRACTION     COLON RESECTION N/A 12/01/2013   Procedure: HAND ASSISTED LAPAROSCOPIC PARTIAL COLECTOMY;  Surgeon: Dalia Heading, MD;  Location: [facility];  Service: General;  Laterality: N/A;   EYE SURGERY     HEMORRHOID SURGERY     HERNIA REPAIR  09/04/2016   LAPAROSCOPIC VENTRAL INCISIONAL HERNIA REPAIR POSSIBLE OPEN (N/A)    INCISIONAL HERNIA REPAIR N/A 09/04/2016   Procedure: LAPAROSCOPIC VENTRAL INCISIONAL HERNIA REPAIR POSSIBLE OPEN;  Surgeon: Ovidio Kin, MD;  Location: [facility];  Service: General;  Laterality: N/A;   INSERTION OF MESH N/A 09/04/2016   Procedure: INSERTION OF MESH;  Surgeon: Ovidio Kin, MD;  Location: [facility];  Service: General;  Laterality: N/A;   PARTIAL HYSTERECTOMY     TONSILLECTOMY      Social History   Socioeconomic History   Marital status: Widowed    Spouse name: Not on file   Number of children: 3   Years of education: 15   Highest education level: Some college, no degree  Occupational History   Occupation: retired    Associate Professor: RETIRED  Tobacco Use   Smoking status: Former    Packs/day: 1.00    Years: 35.00    Additional pack years: 0.00    Total pack years: 35.00    Types: Cigarettes    Quit date: 10/26/2002    Years since quitting: 20.5   Smokeless tobacco: Never  Vaping Use   Vaping Use: Never used  Substance and Sexual Activity   Alcohol use: Yes    Alcohol/week: 4.0 standard drinks of alcohol    Types: 4 Glasses of wine per week    Comment: wine   Drug use: No   Sexual activity: Not Currently  Other Topics Concern   Not on file  Social History Narrative   Son and grandson live with her for now - 2023   Social Determinants of Health   Financial Resource Strain: Low Risk  (01/11/2023)   Overall Financial Resource Strain (CARDIA)    Difficulty of Paying Living Expenses: Not hard at all  Food Insecurity: No Food Insecurity (01/11/2023)   Hunger Vital Sign    Worried About Running Out of Food in the Last Year: Never true    Ran Out of Food in the Last Year: Never true  Transportation Needs: No Transportation Needs (01/11/2023)   PRAPARE - Administrator, Civil Service (Medical): No    Lack of Transportation (Non-Medical): No  Physical Activity: Sufficiently Active (01/11/2023)   Exercise Vital Sign    Days of Exercise per Week: 5 days     Minutes of Exercise per Session: 30 min  Stress: No Stress Concern Present (01/11/2023)   [employer] of Occupational Health - Occupational Stress Questionnaire    Feeling of Stress : Not at all  Social Connections: Moderately Integrated (01/11/2023)   Social Connection and Isolation Panel [NHANES]    Frequency of Communication with Friends and Family: More than three times a week    Frequency of Social Gatherings with Friends and Family: More than three times a week    Attends Religious Services: More than 4 times per year    Active Member of [employer] or Organizations: Yes    Attends Banker Meetings: More than 4 times per year    Marital Status: Widowed  Intimate Partner Violence: Not At Risk (01/11/2023)   Humiliation, Afraid, Rape, and Kick questionnaire    Fear of Current or Ex-Partner: No    Emotionally Abused: No    Physically Abused: No    Sexually Abused: No    Outpatient Encounter Medications as of 04/28/2023  Medication Sig   alendronate (FOSAMAX) 70 MG tablet Take 1 tablet (70 mg total) by mouth every 7 (seven) days. Take with a full glass of water on an empty stomach.   Ascorbic Acid (VITAMIN C) 1000 MG tablet Take 1,000 mg by mouth daily. Reported on 11/21/2015   Calcium Carbonate-Vitamin D (CALTRATE 600+D) 600-400 MG-UNIT tablet Take 1 tablet by mouth daily.   Cholecalciferol (VITAMIN D3) 5000 units CAPS Take 1 capsule by mouth every Monday, Wednesday, and Friday.   denosumab (PROLIA) 60 MG/ML SOSY injection Inject 60 mg into the skin every 6 (six) months.   enalapril (VASOTEC) 5 MG tablet Take 1 tablet (5 mg total) by mouth daily.   ezetimibe (ZETIA) 10 MG tablet Take 1 tablet (10 mg total) by mouth daily.   famotidine-calcium carbonate-magnesium hydroxide (PEPCID COMPLETE) 10-800-165 MG chewable tablet Chew 1 tablet by mouth daily.   nystatin cream (MYCOSTATIN) Apply 1 application topically 2 (two) times daily.   Polyethylene Glycol 3350 (MIRALAX PO) Take 1  Dose by mouth daily.   spironolactone (ALDACTONE) 50 MG tablet Take 1 tablet (50 mg total) by mouth daily.   triamcinolone ointment (KENALOG) 0.5 % Apply 1 Application topically 2 (two) times daily.   vitamin B-12 (CYANOCOBALAMIN) 1000 MCG tablet Take 1,000 mcg by mouth daily.   vitamin k 100 MCG tablet Take 100 mcg by mouth daily.   No facility-administered encounter medications on file as of 04/28/2023.    Allergies  Allergen Reactions   Tolmetin Swelling   Actonel [Risedronate Sodium] Other (See Comments)    Reaction=Heart burn Atelvia - caused diarrhea and vomiting   Docosahexaenoic Acid (  Dha) Other (See Comments)    Bruising   Fosamax [Alendronate Sodium] Other (See Comments)    Reaction=Heart burn   Crestor [Rosuvastatin Calcium] Other (See Comments)    Severe myalgias    Nsaids Swelling    Reaction=Swelling of face   Other     General anesthesia-nausea   Augmentin [Amoxicillin-Pot Clavulanate]     Pt thinks she had nausea with this med when asked 04/27/17   Cephalexin Itching and Rash   Cephalosporins Rash   Ciprofloxacin Itching and Rash   Lovaza [Omega-3-Acid Ethyl Esters] Other (See Comments)    Reaction=skin bruise     Review of Systems  Constitutional: Negative.  Negative for chills, fatigue and fever.  HENT: Negative.    Eyes: Negative.   Respiratory: Negative.    Cardiovascular: Negative.   Gastrointestinal: Negative.   Endocrine: Negative.   Genitourinary: Negative.   Musculoskeletal: Negative.   Skin:  Positive for color change and wound (skin tear). Negative for pallor and rash.  Neurological: Negative.   Hematological: Negative.   Psychiatric/Behavioral: Negative.  Negative for self-injury and suicidal ideas.   All other systems reviewed and are negative.       Objective:  BP (!) 128/57   Pulse 71   Temp (!) 96.6 F (35.9 C)   Ht 5\' 1"  (1.549 m)   Wt 114 lb 9.6 oz (52 kg)   SpO2 98%   BMI 21.65 kg/m    Wt Readings from Last 3 Encounters:   04/28/23 114 lb 9.6 oz (52 kg)  04/19/23 115 lb 6.4 oz (52.3 kg)  03/10/23 117 lb 2 oz (53.1 kg)    Physical Exam Vitals and nursing note reviewed.  Constitutional:      Appearance: Normal appearance.    HENT:     Head: Normocephalic and atraumatic.     Nose: Nose normal.     Mouth/Throat:     Mouth: Mucous membranes are moist.  Eyes:     Conjunctiva/sclera: Conjunctivae normal.     Pupils: Pupils are equal, round, and reactive to light.  Cardiovascular:     Rate and Rhythm: Normal rate and regular rhythm.     Heart sounds: Normal heart sounds.  Pulmonary:     Effort: Pulmonary effort is normal.     Breath sounds: Normal breath sounds.  Musculoskeletal:        General: Normal range of motion.     Cervical back: Normal range of motion.  Skin:    General: Skin is warm.     Capillary Refill: Capillary refill takes less than 2 seconds.     Comments: Skin is very thin and frail.  Neurological:     General: No focal deficit present.     Mental Status: She is alert and oriented to person, place, and time.  Psychiatric:        Mood and Affect: Mood normal.        Behavior: Behavior normal.        Thought Content: Thought content normal.        Judgment: Judgment normal.     Results for orders placed or performed in visit on 03/10/23  CMP14+EGFR  Result Value Ref Range   Glucose 154 (H) 70 - 99 mg/dL   BUN 26 8 - 27 mg/dL   Creatinine, Ser 7.82 0.57 - 1.00 mg/dL   eGFR 60 >95 AO/ZHY/8.65   BUN/Creatinine Ratio 27 12 - 28   Sodium 137 134 - 144 mmol/L   Potassium  4.5 3.5 - 5.2 mmol/L   Chloride 100 96 - 106 mmol/L   [name] 23 20 - 29 mmol/L   Calcium 10.2 8.7 - 10.3 mg/dL   Total Protein 6.0 6.0 - 8.5 g/dL   Albumin 4.0 3.7 - 4.7 g/dL   Globulin, Total 2.0 1.5 - 4.5 g/dL   Albumin/Globulin Ratio 2.0 1.2 - 2.2   Bilirubin Total 0.3 0.0 - 1.2 mg/dL   Alkaline Phosphatase 46 44 - 121 IU/L   AST 17 0 - 40 IU/L   ALT 15 0 - 32 IU/L  Lipid panel  Result Value Ref Range    Cholesterol, Total 245 (H) 100 - 199 mg/dL   Triglycerides 253 (H) 0 - 149 mg/dL   HDL 70 >66 mg/dL   VLDL Cholesterol Cal 51 (H) 5 - 40 mg/dL   LDL Chol Calc (NIH) 440 (H) 0 - 99 mg/dL   Chol/HDL Ratio 3.5 0.0 - 4.4 ratio  Bayer DCA Hb A1c Waived  Result Value Ref Range   HB A1C (BAYER DCA - WAIVED) 5.9 (H) 4.8 - 5.6 %       Pertinent labs & imaging results that were available during my care of the patient were reviewed by me and considered in my medical decision making.  Assessment & Plan:  Dorothy Pitts was seen today for skin tear.  Diagnoses and all orders for this visit:  Skin tear of left lower leg without complication, subsequent encounter Change dressing every 2 days to prevent xeroform from drying out. Change dressing after moistening to allow release from wound bed. Use Vaseline or antibacterial ointment on skin flap for two days only. Then cover with xerorform guaze. Then cover with nonstick gauze pad and wrap with cohesive bandage. Do not use tape on your skin.   Watch for signs of infection  fever, redness and swelling around wound, red streaks up leg, puss from wound or bad smell from wound. Seek immediate medical attention if you notices any of these signs The wound is cleansed, debrided of foreign material as much as possible, and dressed. The patient is alerted to watch for any signs of infection (redness, pus, pain, increased swelling or fever) and call if such occurs. Home wound care instructions are provided. Tetanus vaccination status reviewed: tetanus re-vaccination not indicated.  Continue all other maintenance medications.   Follow up plan: Return if symptoms worsen or fail to improve. Seek immediate medical attention with signs and symptoms of infection  Continue healthy lifestyle choices, including diet (rich in fruits, vegetables, and lean proteins, and low in salt and simple carbohydrates) and exercise (at least 30 minutes of moderate physical activity  daily).   The above assessment and management plan was discussed with the patient. The patient verbalized understanding of and has agreed to the management plan. Patient is aware to call the clinic if they develop any new symptoms or if symptoms persist or worsen. Patient is aware when to return to the clinic for a follow-up visit. Patient educated on when it is appropriate to go to the emergency department.   Maryelizabeth Kaufmann NP student [facility] [phone]  I personally was present during the history, physical exam, and medical decision-making activities of this visit and have verified that the services and findings are accurately documented in the nurse practitioner student's note.  Kari Baars, FNP-C [facility] [address] [place], [place] [address] [phone]

## 2023-04-28 NOTE — Patient Instructions (Addendum)
  Use Vaseline or antibacterial ointment on skin flap. Then cover with xerorform guaze. Then cover with nonstick gauze pad and wrap with cohesive bandage. Do not use tape on your skin every 2 days.   Watch for signs of infection  fever, redness and swelling around wound, red streaks up leg, puss from wound or bad smell from wound. Seek immediate medical attention if you notices any of these signs

## 2023-05-20 ENCOUNTER — Encounter: Payer: Self-pay | Admitting: Family Medicine

## 2023-05-20 ENCOUNTER — Ambulatory Visit (INDEPENDENT_AMBULATORY_CARE_PROVIDER_SITE_OTHER): Payer: Medicare Other | Admitting: Family Medicine

## 2023-05-20 VITALS — BP 125/62 | HR 75 | Temp 98.0°F | Ht 61.0 in | Wt 115.0 lb

## 2023-05-20 DIAGNOSIS — S81812D Laceration without foreign body, left lower leg, subsequent encounter: Secondary | ICD-10-CM | POA: Diagnosis not present

## 2023-05-20 NOTE — Progress Notes (Signed)
   Acute Office Visit  Subjective:     Patient ID: Dorothy Pitts, female    DOB: Apr 20, 1941, 82 y.o.   MRN: [id number]  Chief Complaint  Patient presents with   Wound Check    Left leg    Wound Check Dorothy Pitts was originally treated more than 14 days ago. Previous treatment included wound cleansing or irrigation. There has been bloody (scant) discharge from the wound. There is no redness present. There is no swelling present. The pain has improved. Dorothy Pitts has no difficulty moving the affected extremity or digit.   Dorothy Pitts has been keeping area covered with xeroform gauze, nonstick gauze, then cohesive bandage. Cleaning with soap and water.  ROS As per HPI.      Objective:    BP 125/62   Pulse 75   Temp 98 F (36.7 C)   Ht 5\' 1"  (1.549 m)   Wt 115 lb (52.2 kg)   SpO2 96%   BMI 21.73 kg/m    Physical Exam Vitals and nursing note reviewed.  Constitutional:      General: Dorothy Pitts is not in acute distress.    Appearance: Dorothy Pitts is not ill-appearing, toxic-appearing or diaphoretic.  Musculoskeletal:     Right lower leg: No edema.     Left lower leg: No edema.  Skin:    General: Skin is warm and dry.     Comments: 3 cm x 2 cm skin tear to left anterior lower leg. No exudate, drainage, warmth, erythema. Mild tenderness. Fragile skin. Brisk cap refill. Sensation intact.   Neurological:     General: No focal deficit present.     Mental Status: Dorothy Pitts is alert and oriented to person, place, and time.  Psychiatric:        Mood and Affect: Mood normal.        Behavior: Behavior normal.     No results found for any visits on 05/20/23.      Assessment & Plan:   Kanika Bungert" was seen today for wound check.  Diagnoses and all orders for this visit:  Skin tear of left lower leg without complication, subsequent encounter Healing well. No signs of infection. Wound cleansed with saline. Dressed with xeroform dressing, nonstick gauze, and tegaderm. Dorothy Pitts reports that cohesive dressing is  uncomfortable, so will try tegaderm dressing. Discussed to wet teagaderm well prior to removal. Discussed symptomatic care and return precautions.    The patient indicates understanding of these issues and agrees with the plan.   Gabriel Earing, FNP

## 2023-05-26 ENCOUNTER — Ambulatory Visit (INDEPENDENT_AMBULATORY_CARE_PROVIDER_SITE_OTHER): Payer: Medicare Other | Admitting: Family Medicine

## 2023-05-26 ENCOUNTER — Encounter: Payer: Self-pay | Admitting: Family Medicine

## 2023-05-26 VITALS — BP 96/60 | Temp 98.2°F | Ht 61.0 in | Wt 115.0 lb

## 2023-05-26 DIAGNOSIS — S51811A Laceration without foreign body of right forearm, initial encounter: Secondary | ICD-10-CM

## 2023-05-26 DIAGNOSIS — S81812D Laceration without foreign body, left lower leg, subsequent encounter: Secondary | ICD-10-CM

## 2023-05-26 NOTE — Patient Instructions (Signed)
Change dressing every 2 days to prevent xeroform from drying out. Change dressing after moistening to allow release from wound bed. Use Vaseline or antibacterial ointment on skin flap for two days only. Then cover with xerorform guaze. Then cover with nonstick gauze pad and wrap with cohesive bandage. Do not use tape on your skin.    Watch for signs of infection  fever, redness and swelling around wound, red streaks up leg, puss from wound or bad smell from wound. Seek immediate medical attention if you notices any of these signs The wound is cleansed, debrided of foreign material as much as possible, and dressed. The patient is alerted to watch for any signs of infection (redness, pus, pain, increased swelling or fever) and call if such occurs. Home wound care instructions are provided. Tetanus vaccination status reviewed: tetanus re-vaccination not indicated.  Continue all other maintenance medications.

## 2023-05-26 NOTE — Progress Notes (Signed)
Acute Office Visit  Subjective:  Patient ID: Dorothy Pitts, female    DOB: 1941-05-22, 82 y.o.   MRN: [id number]  Chief Complaint  Patient presents with   Laceration    Right arm    Laceration   Patient is in today for skin tear on her right forearm. States that she was walking around her kitchen and bumped into her [employer]. Happened yesterday afternoon. Has been putting xeroform and guaze on her arm. Endorses redness and swelling. Denies fever, drainage.   ROS As per HPI  Objective:  BP 96/60   Temp 98.2 F (36.8 C)   Ht 5\' 1"  (1.549 m)   Wt 115 lb (52.2 kg)   SpO2 96%   BMI 21.73 kg/m   Physical Exam Constitutional:      General: She is awake. She is not in acute distress.    Appearance: Normal appearance. She is well-developed and well-groomed. She is not ill-appearing, toxic-appearing or diaphoretic.  Cardiovascular:     Rate and Rhythm: Normal rate and regular rhythm.     Pulses: Normal pulses.          Radial pulses are 2+ on the right side and 2+ on the left side.       Posterior tibial pulses are 2+ on the right side and 2+ on the left side.     Heart sounds: Normal heart sounds. No murmur heard.    No gallop.  Pulmonary:     Effort: Pulmonary effort is normal. No respiratory distress.     Breath sounds: Normal breath sounds. No stridor. No wheezing, rhonchi or rales.  Musculoskeletal:     Cervical back: Full passive range of motion without pain and neck supple.     Right lower leg: No edema.     Left lower leg: No edema.  Skin:    General: Skin is warm.     Capillary Refill: Capillary refill takes less than 2 seconds.          Comments: ~3 cm laceration with surrounding area of erythema and swelling.  Healing wound on left shin with scabs   Neurological:     General: No focal deficit present.     Mental Status: She is alert, oriented to person, place, and time and easily aroused. Mental status is at baseline.     GCS: GCS eye subscore is 4.  GCS verbal subscore is 5. GCS motor subscore is 6.     Motor: No weakness.  Psychiatric:        Attention and Perception: Attention and perception normal.        Mood and Affect: Mood and affect normal.        Speech: Speech normal.        Behavior: Behavior normal. Behavior is cooperative.        Thought Content: Thought content normal. Thought content does not include homicidal or suicidal ideation. Thought content does not include homicidal or suicidal plan.        Cognition and Memory: Cognition and memory normal.        Judgment: Judgment normal.        05/26/2023    3:38 PM 05/20/2023   10:48 AM 04/28/2023    8:29 AM  Depression screen PHQ 2/9  Decreased Interest 0 0 0  Down, Depressed, Hopeless 0 0 0  PHQ - 2 Score 0 0 0  Altered sleeping 0 0   Tired, decreased energy 0 0  Change in appetite 0 0   Feeling bad or failure about yourself  0 0   Trouble concentrating 0 0   Moving slowly or fidgety/restless 0 0   Suicidal thoughts 0 0   PHQ-9 Score 0 0   Difficult doing work/chores Not difficult at all Not difficult at all       05/26/2023    3:39 PM 05/20/2023   10:48 AM 04/19/2023   12:17 PM 03/10/2023    2:08 PM  GAD 7 : Generalized Anxiety Score  Nervous, Anxious, on Edge 0 0 0 0  Control/stop worrying 0 0 0 0  Worry too much - different things 0 0 0 0  Trouble relaxing 0 0 0 0  Restless 0 0 0 0  Easily annoyed or irritable 0 0 0 0  Afraid - awful might happen 0 0 0 0  Total GAD 7 Score 0 0 0 0  Anxiety Difficulty Not difficult at all Not difficult at all Not difficult at all Not difficult at all   Assessment & Plan:  1. Skin tear of forearm without complication, right, initial encounter Xeroform dressing applied. Supplies provided. No signs of infection. Tegaderm and Cohesive dressing uncomfortable. Will try tefla dressing. Discussed wetting edges of tefla prior to removal.   2. Skin tear of left lower leg without complication, subsequent encounter Healing well.  No signs of infection. Xeroform dressing applied. Supplies provided. No signs of infection. Tegaderm and Cohesive dressing uncomfortable. Will try tefla dressing. Discussed wetting edges of tefla prior to removal.  Change dressing every 2 days to prevent xeroform from drying out. Change dressing after moistening to allow release from wound bed. Use Vaseline or antibacterial ointment on skin flap for two days only. Then cover with xerorform guaze. Then cover with nonstick gauze pad and wrap with cohesive bandage. Do not use tape on your skin.    Watch for signs of infection  fever, redness and swelling around wound, red streaks up leg, puss from wound or bad smell from wound. Seek immediate medical attention if you notices any of these signs The wound is cleansed, debrided of foreign material as much as possible, and dressed. The patient is alerted to watch for any signs of infection (redness, pus, pain, increased swelling or fever) and call if such occurs. Home wound care instructions are provided. Tetanus vaccination status reviewed: tetanus re-vaccination not indicated.  Continue all other maintenance medications.  The above assessment and management plan was discussed with the patient. The patient verbalized understanding of and has agreed to the management plan using shared-decision making. Patient is aware to call the clinic if they develop any new symptoms or if symptoms fail to improve or worsen. Patient is aware when to return to the clinic for a follow-up visit. Patient educated on when it is appropriate to go to the emergency department.   Return in about 1 week (around 06/02/2023).  Neale Burly, DNP-FNP [facility] [address] [place], [place] [address] [phone]

## 2023-06-08 ENCOUNTER — Encounter: Payer: Self-pay | Admitting: Family Medicine

## 2023-06-08 ENCOUNTER — Ambulatory Visit (INDEPENDENT_AMBULATORY_CARE_PROVIDER_SITE_OTHER): Payer: Medicare Other | Admitting: Family Medicine

## 2023-06-08 VITALS — BP 124/63 | HR 69 | Temp 97.8°F | Ht 61.0 in | Wt 115.0 lb

## 2023-06-08 DIAGNOSIS — E782 Mixed hyperlipidemia: Secondary | ICD-10-CM

## 2023-06-08 DIAGNOSIS — S51811D Laceration without foreign body of right forearm, subsequent encounter: Secondary | ICD-10-CM

## 2023-06-08 NOTE — Progress Notes (Signed)
Acute Office Visit  Subjective:  Patient ID: Dorothy Pitts, female    DOB: [birth year], 82 y.o.   MRN: [id number]  Chief Complaint  Patient presents with   Wound Check   Wound Check  Patient is in today for follow up of wound. Has been changing dressing every other day. Denies fever and pain at the site.  Reports that her cousin has skin similar to hers and she received a steri strip dressing. Patient would like to try steri strips on her wound as the coban itches and causes pain when removed.   In addition had a question about fosamax and zetia. She stopped both due to heartburn and would like to restart zetia.   In addition, patient reported that she has neck pain when she is trying to do things such as opening jars. Denies numbness and tingling, only occurs with straining.   ROS As per  Objective:  BP 124/63   Pulse 69   Temp 97.8 F (36.6 C)   Ht 5\' 1"  (1.549 m)   Wt 115 lb (52.2 kg)   SpO2 97%   BMI 21.73 kg/m   Physical Exam Constitutional:      General: She is awake. She is not in acute distress.    Appearance: Normal appearance. She is well-developed and well-groomed. She is not ill-appearing, toxic-appearing or diaphoretic.  Cardiovascular:     Rate and Rhythm: Normal rate and regular rhythm.     Pulses: Normal pulses.          Radial pulses are 2+ on the right side and 2+ on the left side.       Posterior tibial pulses are 2+ on the right side and 2+ on the left side.     Heart sounds: Normal heart sounds. No murmur heard.    No gallop.  Pulmonary:     Effort: Pulmonary effort is normal. No respiratory distress.     Breath sounds: Normal breath sounds. No stridor. No wheezing, rhonchi or rales.  Musculoskeletal:     Cervical back: Full passive range of motion without pain and neck supple.     Right lower leg: No edema.     Left lower leg: No edema.  Skin:    General: Skin is warm.     Capillary Refill: Capillary refill takes less than 2 seconds.      Comments: Healing Approximately 3 cm laceration of right forearm with granulation tissue   Neurological:     General: No focal deficit present.     Mental Status: She is alert, oriented to person, place, and time and easily aroused. Mental status is at baseline.     GCS: GCS eye subscore is 4. GCS verbal subscore is 5. GCS motor subscore is 6.     Motor: No weakness.  Psychiatric:        Attention and Perception: Attention and perception normal.        Mood and Affect: Mood and affect normal.        Speech: Speech normal.        Behavior: Behavior normal. Behavior is cooperative.        Thought Content: Thought content normal. Thought content does not include homicidal or suicidal ideation. Thought content does not include homicidal or suicidal plan.        Cognition and Memory: Cognition and memory normal.        Judgment: Judgment normal.      Assessment & Plan:  1.  Skin tear of right forearm without complication, subsequent encounter No signs of local or systemic infection. Dressed wound with steri strips. Discussed with patient to follow up in 2 weeks.   2. Mixed hyperlipidemia Discussed with patient okay to restart zetia.   The above assessment and management plan was discussed with the patient. The patient verbalized understanding of and has agreed to the management plan using shared-decision making. Patient is aware to call the clinic if they develop any new symptoms or if symptoms fail to improve or worsen. Patient is aware when to return to the clinic for a follow-up visit. Patient educated on when it is appropriate to go to the emergency department.   Return in about 2 weeks (around 06/22/2023) for wound check .  Neale Burly, DNP-FNP [facility] [address] [place], [place] [address] [phone]

## 2023-06-15 ENCOUNTER — Encounter: Payer: Self-pay | Admitting: Family Medicine

## 2023-06-22 ENCOUNTER — Encounter: Payer: Self-pay | Admitting: Family Medicine

## 2023-06-22 ENCOUNTER — Ambulatory Visit (INDEPENDENT_AMBULATORY_CARE_PROVIDER_SITE_OTHER): Payer: Medicare Other | Admitting: Family Medicine

## 2023-06-22 VITALS — BP 118/66 | HR 75 | Temp 98.0°F | Ht 61.0 in | Wt 115.0 lb

## 2023-06-22 DIAGNOSIS — S81812D Laceration without foreign body, left lower leg, subsequent encounter: Secondary | ICD-10-CM | POA: Diagnosis not present

## 2023-06-22 NOTE — Progress Notes (Signed)
Acute Office Visit  Subjective:  Patient ID: Dorothy Pitts, female    DOB: [birth year], 82 y.o.   MRN: [id number]  Chief Complaint  Patient presents with   Wound Check   Wound Check There has been no drainage from the wound. There is no redness present. There is no swelling present. There is no pain present. She has no difficulty moving the affected extremity or digit.  States that it is significantly improved. Is using sensitive skin band aids and then returned to xeroform and gauze over it. Denies fever and fatigue   ROS As per HPI  Objective:  BP 118/66   Pulse 75   Temp 98 F (36.7 C)   Ht 5\' 1"  (1.549 m)   Wt 115 lb (52.2 kg)   SpO2 95%   BMI 21.73 kg/m   Physical Exam Constitutional:      General: She is awake. She is not in acute distress.    Appearance: Normal appearance. She is well-developed and well-groomed. She is not ill-appearing, toxic-appearing or diaphoretic.  Cardiovascular:     Rate and Rhythm: Normal rate.     Pulses: Normal pulses.          Radial pulses are 2+ on the right side and 2+ on the left side.       Posterior tibial pulses are 2+ on the right side and 2+ on the left side.     Heart sounds: Normal heart sounds. No murmur heard.    No gallop.  Pulmonary:     Effort: Pulmonary effort is normal. No respiratory distress.     Breath sounds: Normal breath sounds. No stridor. No wheezing, rhonchi or rales.  Musculoskeletal:     Cervical back: Full passive range of motion without pain and neck supple.     Right lower leg: No edema.     Left lower leg: No edema.  Skin:    General: Skin is warm.     Capillary Refill: Capillary refill takes less than 2 seconds.     Comments: Healing wound on right forearm with scar formation   Neurological:     General: No focal deficit present.     Mental Status: She is alert, oriented to person, place, and time and easily aroused. Mental status is at baseline.     GCS: GCS eye subscore is 4. GCS verbal  subscore is 5. GCS motor subscore is 6.     Motor: No weakness.  Psychiatric:        Attention and Perception: Attention and perception normal.        Mood and Affect: Mood and affect normal.        Speech: Speech normal.        Behavior: Behavior normal. Behavior is cooperative.        Thought Content: Thought content normal. Thought content does not include homicidal or suicidal ideation. Thought content does not include homicidal or suicidal plan.        Cognition and Memory: Cognition and memory normal.        Judgment: Judgment normal.       Assessment & Plan:  1. Skin tear of left lower leg without complication, subsequent encounter Significantly improved skin tear. Discussed continued at home care for healing wound.   The above assessment and management plan was discussed with the patient. The patient verbalized understanding of and has agreed to the management plan using shared-decision making. Patient is aware to call the clinic if  they develop any new symptoms or if symptoms fail to improve or worsen. Patient is aware when to return to the clinic for a follow-up visit. Patient educated on when it is appropriate to go to the emergency department.   Return if symptoms worsen or fail to improve.  Neale Burly, DNP-FNP [facility] [address] [place], [place] [address] [phone]

## 2023-09-13 ENCOUNTER — Encounter: Payer: Self-pay | Admitting: Family Medicine

## 2023-09-13 ENCOUNTER — Ambulatory Visit (INDEPENDENT_AMBULATORY_CARE_PROVIDER_SITE_OTHER): Payer: Medicare Other | Admitting: Family Medicine

## 2023-09-13 VITALS — BP 119/65 | HR 78 | Temp 98.3°F | Ht 61.0 in | Wt 114.4 lb

## 2023-09-13 DIAGNOSIS — I10 Essential (primary) hypertension: Secondary | ICD-10-CM | POA: Diagnosis not present

## 2023-09-13 DIAGNOSIS — M810 Age-related osteoporosis without current pathological fracture: Secondary | ICD-10-CM

## 2023-09-13 DIAGNOSIS — I700 Atherosclerosis of aorta: Secondary | ICD-10-CM | POA: Diagnosis not present

## 2023-09-13 DIAGNOSIS — E782 Mixed hyperlipidemia: Secondary | ICD-10-CM

## 2023-09-13 DIAGNOSIS — Z0000 Encounter for general adult medical examination without abnormal findings: Secondary | ICD-10-CM

## 2023-09-13 DIAGNOSIS — Z789 Other specified health status: Secondary | ICD-10-CM

## 2023-09-13 DIAGNOSIS — R7303 Prediabetes: Secondary | ICD-10-CM

## 2023-09-13 NOTE — Progress Notes (Signed)
Complete physical exam  Patient: Dorothy Pitts   DOB: 1941-04-13   82 y.o. Female  MRN: [id number]  Subjective:    Chief Complaint  Patient presents with   Annual Exam    Dorothy Pitts is a 82 y.o. female who presents today for a complete physical exam. She reports consuming a general diet.  She has not been exercising for the last few months due to a pinched nerve. She has seen ortho for this and has upcoming MRI.  She generally feels fairly well. She reports sleeping well. She does not have additional problems to discuss today.    Most recent fall risk assessment:    09/13/2023    2:01 PM  Fall Risk   Falls in the past year? 0     Most recent depression screenings:    09/13/2023    2:03 PM 06/22/2023    1:06 PM  PHQ 2/9 Scores  PHQ - 2 Score 0 0  PHQ- 9 Score 0 0    Vision:Not within last year  and Dental: No current dental problems and Receives regular dental care  Past Medical History:  Diagnosis Date   Acute diverticulitis 10/07/2013   With microperforation.   Anemia 10/08/2013   Bowel obstruction (HCC)    Cataract    Diverticulosis of colon (without mention of hemorrhage)    GERD (gastroesophageal reflux disease)    Hemorrhoids    HOH (hard of hearing)    Hyperlipidemia    Hypertension    IBS (irritable bowel syndrome)    Osteoporosis    PONV (postoperative nausea and vomiting)    Thoracic aortic atherosclerosis (HCC)    UTI (lower urinary tract infection) 10/07/2013   Vitamin D deficiency       Patient Care Team: Gabriel Earing, FNP as PCP - General (Family Medicine) Pyrtle, Carie Caddy, MD as Consulting Physician (Gastroenterology) Annamaria Helling, MD as Consulting Physician (Obstetrics and Gynecology) Berna Bue, MD as Consulting Physician (General Surgery)   Outpatient Medications Prior to Visit  Medication Sig   Ascorbic Acid (VITAMIN C) 1000 MG tablet Take 1,000 mg by mouth daily. Reported on 11/21/2015   Calcium Carbonate-Vitamin  D (CALTRATE 600+D) 600-400 MG-UNIT tablet Take 1 tablet by mouth daily.   Cholecalciferol (VITAMIN D3) 5000 units CAPS Take 1 capsule by mouth every Monday, Wednesday, and Friday.   denosumab (PROLIA) 60 MG/ML SOSY injection Inject 60 mg into the skin every 6 (six) months.   enalapril (VASOTEC) 5 MG tablet Take 1 tablet (5 mg total) by mouth daily.   famotidine-calcium carbonate-magnesium hydroxide (PEPCID COMPLETE) 10-800-165 MG chewable tablet Chew 1 tablet by mouth daily.   Polyethylene Glycol 3350 (MIRALAX PO) Take 1 Dose by mouth daily.   spironolactone (ALDACTONE) 50 MG tablet Take 1 tablet (50 mg total) by mouth daily.   alendronate (FOSAMAX) 70 MG tablet Take 1 tablet (70 mg total) by mouth every 7 (seven) days. Take with a full glass of water on an empty stomach. (Patient not taking: Reported on 09/13/2023)   ezetimibe (ZETIA) 10 MG tablet Take 1 tablet (10 mg total) by mouth daily. (Patient not taking: Reported on 09/13/2023)   [DISCONTINUED] nystatin cream (MYCOSTATIN) Apply 1 application topically 2 (two) times daily.   [DISCONTINUED] triamcinolone ointment (KENALOG) 0.5 % Apply 1 Application topically 2 (two) times daily.   [DISCONTINUED] vitamin B-12 (CYANOCOBALAMIN) 1000 MCG tablet Take 1,000 mcg by mouth daily.   [DISCONTINUED] vitamin k 100 MCG tablet Take 100 mcg  by mouth daily.   No facility-administered medications prior to visit.    ROS        Objective:     BP 119/65   Pulse 78   Temp 98.3 F (36.8 C) (Temporal)   Ht 5\' 1"  (1.549 m)   Wt 114 lb 6 oz (51.9 kg)   SpO2 98%   BMI 21.61 kg/m    Physical Exam Vitals and nursing note reviewed.  Constitutional:      General: She is not in acute distress.    Appearance: Normal appearance. She is not ill-appearing, toxic-appearing or diaphoretic.  HENT:     Head: Normocephalic.     Right Ear: Tympanic membrane, ear canal and external ear normal.     Left Ear: Tympanic membrane, ear canal and external ear  normal.     Nose: Nose normal.     Mouth/Throat:     Mouth: Mucous membranes are moist.     Pharynx: Oropharynx is clear.  Eyes:     Extraocular Movements: Extraocular movements intact.     Conjunctiva/sclera: Conjunctivae normal.     Pupils: Pupils are equal, round, and reactive to light.  Neck:     Thyroid: No thyroid mass, thyromegaly or thyroid tenderness.  Cardiovascular:     Rate and Rhythm: Normal rate and regular rhythm.     Pulses: Normal pulses.     Heart sounds: Normal heart sounds. No murmur heard.    No friction rub. No gallop.  Pulmonary:     Effort: Pulmonary effort is normal. No respiratory distress.     Breath sounds: Normal breath sounds.  Abdominal:     General: Bowel sounds are normal. There is no distension.     Palpations: Abdomen is soft. There is no mass.     Tenderness: There is no abdominal tenderness. There is no guarding.  Musculoskeletal:        General: No swelling or tenderness. Normal range of motion.     Cervical back: Normal range of motion and neck supple. No tenderness.     Right lower leg: No edema.     Left lower leg: No edema.  Skin:    General: Skin is warm and dry.     Capillary Refill: Capillary refill takes less than 2 seconds.     Findings: No lesion or rash.  Neurological:     General: No focal deficit present.     Mental Status: She is alert and oriented to person, place, and time.     Cranial Nerves: No cranial nerve deficit.     Motor: No weakness.     Gait: Gait normal.  Psychiatric:        Mood and Affect: Mood normal.        Behavior: Behavior normal.        Thought Content: Thought content normal.        Judgment: Judgment normal.      No results found for any visits on 09/13/23.     Assessment & Plan:    Routine Health Maintenance and Physical Exam  Dorothy "Darel Hong" was seen today for annual exam.  Diagnoses and all orders for this visit:  Routine general medical examination at a health care facility  Primary  hypertension Well controlled on current regimen.  -     CBC with Differential/Platelet; Future -     CMP14+EGFR; Future -     TSH; Future  Thoracic aortic atherosclerosis (HCC) Statin intolerance Mixed hyperlipidemia On zetia.  She will return for fasting labs.  -     Lipid panel; Future  Pre-diabetes A1c pending.  -     Bayer DCA Hb A1c Waived; Future  Osteoporosis On prolia  Immunization History  Administered Date(s) Administered   Td 04/19/2006   Tdap 12/30/2020   Zoster, Live 12/24/2009    Health Maintenance  Topic Date Due   INFLUENZA VACCINE  01/24/2024 (Originally 05/27/2023)   Pneumonia Vaccine 52+ Years old (1 of 1 - PCV) 03/09/2024 (Originally 07/09/2006)   Zoster Vaccines- Shingrix (1 of 2) 06/09/2024 (Originally 07/09/1960)   Medicare Annual Wellness (AWV)  01/11/2024   DEXA SCAN  10/06/2024   DTaP/Tdap/Td (3 - Td or Tdap) 12/31/2030   HPV VACCINES  Aged Out   COVID-19 Vaccine  Discontinued    Discussed health benefits of physical activity, and encouraged her to engage in regular exercise appropriate for her age and condition.  Problem List Items Addressed This Visit       Cardiovascular and Mediastinum   Thoracic aortic atherosclerosis (HCC)     Other   Hyperlipidemia   Relevant Orders   Lipid panel   Pre-diabetes   Relevant Orders   Bayer DCA Hb A1c Waived   Other Visit Diagnoses     Routine general medical examination at a health care facility    -  Primary   Primary hypertension       Relevant Orders   CBC with Differential/Platelet   CMP14+EGFR   TSH      Return in 6 months (on 03/12/2024) for chronic follow up.   The patient indicates understanding of these issues and agrees with the plan.  Gabriel Earing, FNP

## 2023-09-13 NOTE — Patient Instructions (Signed)

## 2023-09-16 ENCOUNTER — Other Ambulatory Visit: Payer: Medicare Other

## 2023-09-20 NOTE — Addendum Note (Signed)
Addended by: Gabriel Earing on: 09/20/2023 09:27 AM   Modules accepted: Level of Service

## 2023-09-28 DIAGNOSIS — M47816 Spondylosis without myelopathy or radiculopathy, lumbar region: Secondary | ICD-10-CM | POA: Insufficient documentation

## 2023-09-28 DIAGNOSIS — M5416 Radiculopathy, lumbar region: Secondary | ICD-10-CM | POA: Insufficient documentation

## 2023-10-06 ENCOUNTER — Telehealth: Payer: Self-pay

## 2023-10-06 DIAGNOSIS — M810 Age-related osteoporosis without current pathological fracture: Secondary | ICD-10-CM

## 2023-10-06 MED ORDER — DENOSUMAB 60 MG/ML ~~LOC~~ SOSY
60.0000 mg | PREFILLED_SYRINGE | Freq: Once | SUBCUTANEOUS | Status: AC
  Administered 2023-12-06: 60 mg via SUBCUTANEOUS

## 2023-10-06 NOTE — Telephone Encounter (Signed)
Prolia ordered for PA team to review verification benefits

## 2023-10-08 ENCOUNTER — Encounter: Payer: Self-pay | Admitting: Family

## 2023-10-08 ENCOUNTER — Telehealth (INDEPENDENT_AMBULATORY_CARE_PROVIDER_SITE_OTHER): Payer: Medicare Other | Admitting: Family

## 2023-10-08 DIAGNOSIS — B9689 Other specified bacterial agents as the cause of diseases classified elsewhere: Secondary | ICD-10-CM | POA: Diagnosis not present

## 2023-10-08 DIAGNOSIS — J208 Acute bronchitis due to other specified organisms: Secondary | ICD-10-CM | POA: Diagnosis not present

## 2023-10-08 MED ORDER — AZITHROMYCIN 250 MG PO TABS: ORAL_TABLET | ORAL | 0 refills | Status: DC

## 2023-10-08 MED ORDER — BENZONATATE 200 MG PO CAPS: 200.0000 mg | ORAL_CAPSULE | Freq: Three times a day (TID) | ORAL | 1 refills | Status: DC | PRN

## 2023-10-08 NOTE — Progress Notes (Signed)
Virtual Visit Consent   [name], you are scheduled for a virtual visit with a [facility] provider today. Just as with appointments in the office, your consent must be obtained to participate. Your consent will be active for this visit and any virtual visit you may have with one of our providers in the next 365 days. If you have a MyChart account, a copy of this consent can be sent to you electronically.  As this is a virtual visit, video technology does not allow for your provider to perform a traditional examination. This may limit your provider's ability to fully assess your condition. If your provider identifies any concerns that need to be evaluated in person or the need to arrange testing (such as labs, EKG, etc.), we will make arrangements to do so. Although advances in technology are sophisticated, we cannot ensure that it will always work on either your end or our end. If the connection with a video visit is poor, the visit may have to be switched to a telephone visit. With either a video or telephone visit, we are not always able to ensure that we have a secure connection.  By engaging in this virtual visit, you consent to the provision of healthcare and authorize for your insurance to be billed (if applicable) for the services provided during this visit. Depending on your insurance coverage, you may receive a charge related to this service.  I need to obtain your verbal consent now. Are you willing to proceed with your visit today? FRANNY DEBAKER has provided verbal consent on 10/08/2023 for a virtual visit (video or telephone). Jannifer Rodney, FNP  Date: 10/08/2023 3:50 PM  Virtual Visit via Video Note   I, Jannifer Rodney, connected with  JARIYA CANEPA  ([id number], [birth year]) on 10/08/23 at  3:40 PM EST by a video-enabled telemedicine application and verified that I am speaking with the correct person using two identifiers.  Location: Patient: Virtual Visit Location  Patient: Home Provider: Virtual Visit Location Provider: Home Office   I discussed the limitations of evaluation and management by telemedicine and the availability of in person appointments. The patient expressed understanding and agreed to proceed.    History of Present Illness: Dorothy Pitts is a 82 y.o. who identifies as a female who was assigned female at birth, and is being seen today for cough.  HPI: Cough This is a new problem. The current episode started 1 to 4 weeks ago. The problem has been gradually worsening. The problem occurs every few minutes. The cough is Productive of purulent sputum. Associated symptoms include ear congestion and rhinorrhea. Pertinent negatives include no ear pain, fever, headaches, sore throat or shortness of breath.  Sinusitis Associated symptoms include congestion, coughing and sneezing. Pertinent negatives include no ear pain, headaches, shortness of breath or sore throat. Past treatments include oral decongestants. The treatment provided mild relief.    Problems:  Patient Active Problem List   Diagnosis Date Noted   Statin intolerance 09/13/2023   Postmenopausal 09/09/2022   Decreased hearing of both ears 11/03/2019   Primary insomnia 11/03/2019   Groin pain, chronic, right 04/24/2019   Pre-diabetes 11/28/2015   Hyperlipidemia 08/28/2015   Thoracic aortic atherosclerosis (HCC) 04/15/2015   Osteoporosis 11/07/2014   Diverticulosis of colon    Primary hypertension    Irritable bowel syndrome with constipation     Allergies:  Allergies  Allergen Reactions   Tolmetin Swelling   Actonel [Risedronate Sodium] Other (See Comments)  Reaction=Heart burn Atelvia - caused diarrhea and vomiting   Docosahexaenoic Acid (Dha) (Fish) Other (See Comments)    Bruising   Fosamax [Alendronate Sodium] Other (See Comments)    Reaction=Heart burn   Crestor [Rosuvastatin Calcium] Other (See Comments)    Severe myalgias    Nsaids Swelling     Reaction=Swelling of face   Other     General anesthesia-nausea   Augmentin [Amoxicillin-Pot Clavulanate]     Pt thinks she had nausea with this med when asked 04/27/17   Cephalexin Itching and Rash   Cephalosporins Rash   Ciprofloxacin Itching and Rash   Lovaza [Omega-3-Acid Ethyl Esters (Fish)] Other (See Comments)    Reaction=skin bruise    Medications:  Current Outpatient Medications:    azithromycin (ZITHROMAX) 250 MG tablet, Take 500 mg once, then 250 mg for four days, Disp: 6 tablet, Rfl: 0   benzonatate (TESSALON) 200 MG capsule, Take 1 capsule (200 mg total) by mouth 3 (three) times daily as needed., Disp: 30 capsule, Rfl: 1   alendronate (FOSAMAX) 70 MG tablet, Take 1 tablet (70 mg total) by mouth every 7 (seven) days. Take with a full glass of water on an empty stomach. (Patient not taking: Reported on 09/13/2023), Disp: 4 tablet, Rfl: 11   Ascorbic Acid (VITAMIN C) 1000 MG tablet, Take 1,000 mg by mouth daily. Reported on 11/21/2015, Disp: , Rfl:    Calcium Carbonate-Vitamin D (CALTRATE 600+D) 600-400 MG-UNIT tablet, Take 1 tablet by mouth daily., Disp: 100 tablet, Rfl:    Cholecalciferol (VITAMIN D3) 5000 units CAPS, Take 1 capsule by mouth every Monday, Wednesday, and Friday., Disp: , Rfl:    denosumab (PROLIA) 60 MG/ML SOSY injection, Inject 60 mg into the skin every 6 (six) months., Disp: , Rfl:    enalapril (VASOTEC) 5 MG tablet, Take 1 tablet (5 mg total) by mouth daily., Disp: 90 tablet, Rfl: 3   ezetimibe (ZETIA) 10 MG tablet, Take 1 tablet (10 mg total) by mouth daily. (Patient not taking: Reported on 09/13/2023), Disp: 90 tablet, Rfl: 3   famotidine-calcium carbonate-magnesium hydroxide (PEPCID COMPLETE) 10-800-165 MG chewable tablet, Chew 1 tablet by mouth daily., Disp: , Rfl:    Polyethylene Glycol 3350 (MIRALAX PO), Take 1 Dose by mouth daily., Disp: , Rfl:    spironolactone (ALDACTONE) 50 MG tablet, Take 1 tablet (50 mg total) by mouth daily., Disp: 90 tablet, Rfl:  3  Current Facility-Administered Medications:    denosumab (PROLIA) injection 60 mg, 60 mg, Subcutaneous, Once, Gabriel Earing, FNP  Observations/Objective: Patient is well-developed, well-nourished in no acute distress.  Resting comfortably  at home.  Head is normocephalic, atraumatic.  No labored breathing.  Speech is clear and coherent with logical content.  Patient is alert and oriented at baseline.  Coarse nonproductive cough  Assessment and Plan: 1. Acute bacterial bronchitis (Primary) - azithromycin (ZITHROMAX) 250 MG tablet; Take 500 mg once, then 250 mg for four days  Dispense: 6 tablet; Refill: 0 - benzonatate (TESSALON) 200 MG capsule; Take 1 capsule (200 mg total) by mouth 3 (three) times daily as needed.  Dispense: 30 capsule; Refill: 1  - Take meds as prescribed - Use a cool mist humidifier  -Use saline nose sprays frequently -Force fluids -For any cough or congestion  Use plain Mucinex- regular strength or max strength is fine -For fever or aces or pains- take tylenol or ibuprofen. -Throat lozenges if help -Follow up if symptoms worsen or do not improve    Follow Up Instructions:  I discussed the assessment and treatment plan with the patient. The patient was provided an opportunity to ask questions and all were answered. The patient agreed with the plan and demonstrated an understanding of the instructions.  A copy of instructions were sent to the patient via MyChart unless otherwise noted below.     The patient was advised to call back or seek an in-person evaluation if the symptoms worsen or if the condition fails to improve as anticipated.    Jannifer Rodney, FNP

## 2023-10-08 NOTE — Telephone Encounter (Signed)
Copied from CRM [id number]. Topic: Clinical - Medical Advice >> Oct 08, 2023 10:19 AM Gaetano Hawthorne wrote: Reason for CRM: Patient has been experiencing sneezing, coughing, runny nose, runny eyes for about a week, no fever - Patient has tried to take Claritin - Patient would like to know what else she can do or if something can be called in for her.

## 2023-10-11 ENCOUNTER — Other Ambulatory Visit: Payer: Medicare Other

## 2023-10-11 ENCOUNTER — Other Ambulatory Visit: Payer: Self-pay | Admitting: Family Medicine

## 2023-10-11 DIAGNOSIS — I10 Essential (primary) hypertension: Secondary | ICD-10-CM

## 2023-11-01 ENCOUNTER — Other Ambulatory Visit: Payer: Medicare Other

## 2023-11-02 ENCOUNTER — Telehealth: Payer: Self-pay

## 2023-11-02 NOTE — Telephone Encounter (Signed)
 Prolia VOB initiated via [email or web address]

## 2023-11-08 ENCOUNTER — Other Ambulatory Visit: Payer: Medicare Other

## 2023-11-08 ENCOUNTER — Other Ambulatory Visit (HOSPITAL_COMMUNITY): Payer: Self-pay

## 2023-11-08 NOTE — Telephone Encounter (Signed)
 Pt ready for scheduling for Prolia  on or after : 11/08/23  Out-of-pocket cost due at time of visit: $0  Number of injection/visits approved: -  Primary: [place]  Medicare Prolia  co-insurance: 20% Admin fee co-insurance: 0%  Secondary: [employer] - [employer] Prolia  co-insurance: 100% Admin fee co-insurance: 100%  Medical Benefit Details: Date Benefits were checked: 11/05/23 Deductible: $257/ Coinsurance: 100%/ Admin Fee: 100%  Prior Auth: not required PA# Expiration Date:   # of doses approved:  Pharmacy benefit: Copay $1,133.32 If patient wants fill through the pharmacy benefit please send prescription to:  [facility] , and include estimated need by date in rx notes. Pharmacy will ship medication directly to the office.  Patient not eligible for Prolia  Copay Card. Copay Card can make patient's cost as little as $25. Link to apply: https://www.amgensupportplus.com/copay  ** This summary of benefits is an estimation of the patient's out-of-pocket cost. Exact cost may very based on individual plan coverage.

## 2023-11-08 NOTE — Telephone Encounter (Signed)
 [name] Kitchen

## 2023-11-22 ENCOUNTER — Other Ambulatory Visit: Payer: Medicare Other

## 2023-11-22 DIAGNOSIS — I10 Essential (primary) hypertension: Secondary | ICD-10-CM

## 2023-11-22 DIAGNOSIS — R7303 Prediabetes: Secondary | ICD-10-CM

## 2023-11-22 DIAGNOSIS — E782 Mixed hyperlipidemia: Secondary | ICD-10-CM

## 2023-11-22 LAB — BAYER DCA HB A1C WAIVED: HB A1C (BAYER DCA - WAIVED): 5.4 % (ref 4.8–5.6)

## 2023-11-23 LAB — CMP14+EGFR
ALT: 17 [IU]/L (ref 0–32)
AST: 18 [IU]/L (ref 0–40)
Albumin: 4.2 g/dL (ref 3.7–4.7)
Alkaline Phosphatase: 42 [IU]/L — ABNORMAL LOW (ref 44–121)
BUN/Creatinine Ratio: 29 — ABNORMAL HIGH (ref 12–28)
BUN: 20 mg/dL (ref 8–27)
Bilirubin Total: 0.6 mg/dL (ref 0.0–1.2)
CO2: 25 mmol/L (ref 20–29)
Calcium: 10.3 mg/dL (ref 8.7–10.3)
Chloride: 101 mmol/L (ref 96–106)
Creatinine, Ser: 0.69 mg/dL (ref 0.57–1.00)
Globulin, Total: 1.9 g/dL (ref 1.5–4.5)
Glucose: 99 mg/dL (ref 70–99)
Potassium: 4.2 mmol/L (ref 3.5–5.2)
Sodium: 141 mmol/L (ref 134–144)
Total Protein: 6.1 g/dL (ref 6.0–8.5)
eGFR: 87 mL/min/{1.73_m2} (ref >59)

## 2023-11-23 LAB — CBC WITH DIFFERENTIAL/PLATELET
Basophils Absolute: 0.1 10*3/uL (ref 0.0–0.2)
Basos: 1 %
EOS (ABSOLUTE): 0.3 10*3/uL (ref 0.0–0.4)
Eos: 6 %
Hematocrit: 42 % (ref 34.0–46.6)
Hemoglobin: 13.7 g/dL (ref 11.1–15.9)
Immature Grans (Abs): 0 10*3/uL (ref 0.0–0.1)
Immature Granulocytes: 0 %
Lymphocytes Absolute: 1.6 10*3/uL (ref 0.7–3.1)
Lymphs: 33 %
MCH: 32.2 pg (ref 26.6–33.0)
MCHC: 32.6 g/dL (ref 31.5–35.7)
MCV: 99 fL — ABNORMAL HIGH (ref 79–97)
Monocytes Absolute: 0.4 10*3/uL (ref 0.1–0.9)
Monocytes: 9 %
Neutrophils Absolute: 2.4 10*3/uL (ref 1.4–7.0)
Neutrophils: 51 %
Platelets: 267 10*3/uL (ref 150–450)
RBC: 4.25 x10E6/uL (ref 3.77–5.28)
RDW: 12 % (ref 11.7–15.4)
WBC: 4.7 10*3/uL (ref 3.4–10.8)

## 2023-11-23 LAB — LIPID PANEL
Chol/HDL Ratio: 3.3 {ratio} (ref 0.0–4.4)
Cholesterol, Total: 255 mg/dL — ABNORMAL HIGH (ref 100–199)
HDL: 77 mg/dL (ref >39)
LDL Chol Calc (NIH): 162 mg/dL — ABNORMAL HIGH (ref 0–99)
Triglycerides: 96 mg/dL (ref 0–149)
VLDL Cholesterol Cal: 16 mg/dL (ref 5–40)

## 2023-11-23 LAB — TSH: TSH: 2.47 u[IU]/mL (ref 0.450–4.500)

## 2023-11-24 ENCOUNTER — Other Ambulatory Visit: Payer: Self-pay | Admitting: *Deleted

## 2023-11-24 DIAGNOSIS — Z789 Other specified health status: Secondary | ICD-10-CM

## 2023-11-24 DIAGNOSIS — E782 Mixed hyperlipidemia: Secondary | ICD-10-CM

## 2023-11-25 NOTE — Telephone Encounter (Addendum)
Appt made for 12/06/2023

## 2023-11-25 NOTE — Telephone Encounter (Signed)
Please call and schedule

## 2023-12-01 ENCOUNTER — Telehealth: Payer: Self-pay

## 2023-12-01 NOTE — Progress Notes (Signed)
 Care Guide Pharmacy Note  12/01/2023 Name: Dorothy Pitts MRN: [id number] DOB: 1941/01/15  Referred By: Joesph Annabella CHRISTELLA, FNP Reason for referral: Care Coordination (Outreach to schedule with Pharm d )   Dorothy Pitts is a 83 y.o. year old female who is a primary care patient of Joesph Annabella CHRISTELLA, FNP.  Dorothy Pitts was referred to the pharmacist for assistance related to: HLD  Successful contact was made with the patient to discuss pharmacy services including being ready for the pharmacist to call at least 5 minutes before the scheduled appointment time and to have medication bottles and any blood pressure readings ready for review. The patient agreed to meet with the pharmacist via face to face  on 01/04/2024  Jeoffrey Buffalo , RMA     [place]  [facility], [facility] Guide  Direct Dial: [phone]  Website: [email or web address]

## 2023-12-06 ENCOUNTER — Ambulatory Visit (INDEPENDENT_AMBULATORY_CARE_PROVIDER_SITE_OTHER): Payer: Medicare Other | Admitting: Family Medicine

## 2023-12-06 DIAGNOSIS — M810 Age-related osteoporosis without current pathological fracture: Secondary | ICD-10-CM | POA: Diagnosis not present

## 2023-12-06 MED ORDER — DENOSUMAB 60 MG/ML ~~LOC~~ SOSY
60.0000 mg | PREFILLED_SYRINGE | Freq: Once | SUBCUTANEOUS | Status: AC
  Administered 2024-06-07 (×2): 60 mg via SUBCUTANEOUS

## 2023-12-06 NOTE — Progress Notes (Signed)
Prolia given and tolerated well °

## 2024-01-04 ENCOUNTER — Other Ambulatory Visit (HOSPITAL_COMMUNITY): Payer: Self-pay

## 2024-01-04 ENCOUNTER — Ambulatory Visit (INDEPENDENT_AMBULATORY_CARE_PROVIDER_SITE_OTHER): Payer: Medicare Other | Admitting: Pharmacist

## 2024-01-04 DIAGNOSIS — I700 Atherosclerosis of aorta: Secondary | ICD-10-CM

## 2024-01-04 DIAGNOSIS — E782 Mixed hyperlipidemia: Secondary | ICD-10-CM | POA: Diagnosis not present

## 2024-01-04 NOTE — Progress Notes (Unsigned)
 01/04/2024 Name: Dorothy Pitts MRN: [id number] DOB: September 25, 1941  Chief Complaint  Patient presents with   Hyperlipidemia    Dorothy Pitts is a 83 y.o. year old female who presented for a telephone visit.   They were referred to the pharmacist by their PCP for assistance in managing hyperlipidemia.   Subjective:  Care Team: Primary Care Provider: Gabriel Earing, FNP ; Next Scheduled Visit: 03/13/2024  Medication Access/Adherence  Current Pharmacy:  [id number] - [place], [place] - [address] [place] [address] Phone: [phone] Fax: [phone]   Patient reports affordability concerns with their medications: No  Patient reports access/transportation concerns to their pharmacy: No  Patient reports adherence concerns with their medications:  No     Hyperlipidemia/ASCVD Risk Reduction  Current lipid lowering medications: ezetimibe 10 mg daily (patient not taking - says this was stopped around the time she started on Fosamax because she wasn't sure which medication was causing a reaction and she never restarted) Medications tried in the past: rosuvastatin (has tried even 1/2 tablet three times weekly),   ASCVD History: none, aortic atherosclerosis  Family History: mother with stroke at 46  Risk Factors: HLD, HTN  Current physical activity: Enjoys playing golf, hoping to get back in to this now that back injection has significantly improved her pain  Says she eats small portions, Eats out 2x per week Breakfast: 2 boiled eggs, [place] patty, blueberries, grapes, tomato, coffee Lunch: spinach, ranch, pimento cheese with crackers Supper: Salmon, sweet potato, baked potato, greens, chicken Snacks: simple mills non gmo snacks Drinks: no tea, occasional coke, occasional glass of wine, water   Objective:  Lab Results  Component Value Date   HGBA1C 5.4 11/22/2023    Lab Results  Component Value Date   CREATININE 0.69  11/22/2023   BUN 20 11/22/2023   NA 141 11/22/2023   K 4.2 11/22/2023   CL 101 11/22/2023   [name] 25 11/22/2023    Lab Results  Component Value Date   CHOL 255 (H) 11/22/2023   HDL 77 11/22/2023   LDLCALC 162 (H) 11/22/2023   TRIG 96 11/22/2023   CHOLHDL 3.3 11/22/2023    Medications Reviewed Today     Reviewed by Vela Prose, RPH (Pharmacist) on 01/04/24 at 1234  Med List Status: <None>   Medication Order Taking? Sig Documenting Provider Last Dose Status Informant  alendronate (FOSAMAX) 70 MG tablet [id number]  Take 1 tablet (70 mg total) by mouth every 7 (seven) days. Take with a full glass of water on an empty stomach.  Patient not taking: Reported on 09/13/2023   Gabriel Earing, FNP  Active   Ascorbic Acid (VITAMIN C) 1000 MG tablet [id number]  Take 1,000 mg by mouth daily. Reported on 11/21/2015 [provider]  Active Self           Med Note Layla Maw Nov 27, 2013  2:47 PM) .  azithromycin (ZITHROMAX) 250 MG tablet [id number]  Take 500 mg once, then 250 mg for four days Jannifer Rodney A, FNP  Active   benzonatate (TESSALON) 200 MG capsule [id number]  Take 1 capsule (200 mg total) by mouth 3 (three) times daily as needed. Junie Spencer, FNP  Active   Calcium Carbonate-Vitamin D 600-10 MG-MCG TABS [id number] Yes Take 1 tablet by mouth daily. [provider]  Active   Cholecalciferol (VITAMIN D3) 5000 units CAPS [id number]  Take 1 capsule by mouth every  Monday, Wednesday, and Friday. [provider]  Active            Med Note Abbe Amsterdam, AMY E   Wed Jan 07, 2022  3:41 PM) Taking 2000 U daily  denosumab (PROLIA) 60 MG/ML SOSY injection [id number]  Inject 60 mg into the skin every 6 (six) months. [provider]  Active   denosumab (PROLIA) injection 60 mg [id number]   Gabriel Earing, FNP  Active   enalapril (VASOTEC) 5 MG tablet [id number]  TAKE 1 TABLET (5 MG TOTAL) BY MOUTH DAILY. Gabriel Earing, FNP  Active    famotidine-calcium carbonate-magnesium hydroxide [facility] COMPLETE) 10-800-165 MG chewable tablet [id number]  Chew 1 tablet by mouth daily. [provider]  Active   Polyethylene Glycol 3350 (MIRALAX PO) [id number]  Take 1 Dose by mouth daily. [provider]  Active   spironolactone (ALDACTONE) 50 MG tablet [id number]  Take 1 tablet (50 mg total) by mouth daily. Gabriel Earing, FNP  Active             Assessment/Plan:   Hyperlipidemia/ASCVD Risk Reduction: - Currently uncontrolled based on LDL 162 mg/dL. Favor LDL goal <70 mg/dL given aortic atherosclerosis. No history of clinical ASCVD. History of intolerance to rosuvastatin even at low dose. Discussed initiation of low dose atorvastatin, but she declined. Also discussed restarting ezetimibe although this would not get her LDL to goal. Patient prefers to try injectable cholesterol medication only. She does have a history of LDL as high as 204 mg/dL indicating possible familial hyperlipidemia. Given she has traditional medicare with a supplemental plan, may be able to get Leqvio covered with $0 copay. Will initiate Leqvio service center start form, investigate coverage and follow up with patient. Discussed that Wilber Bihari would need to be given in a health care setting (likely [facility]) and she was amenable to this. - Reviewed long term complications of uncontrolled cholesterol - Reviewed dietary recommendations including limiting intake of processed foods, encouraged continued heart healthy diet with intake of fresh fruits, vegetables, lean meats - Reviewed lifestyle recommendations including: encouraged her to get back to golfing as able    Follow Up Plan: PharmD on 02/15/24 and PCP on 03/13/2024  Jarrett Ables, PharmD PGY-1 Pharmacy Resident   Kieth Brightly, PharmD, BCACP, CPP Clinical Pharmacist, [facility]

## 2024-01-25 ENCOUNTER — Telehealth: Payer: Self-pay

## 2024-01-25 NOTE — Telephone Encounter (Signed)
 Copied from CRM [id number]. Topic: General - Other >> Jan 24, 2024  5:20 PM Ja-Kwan M wrote: Reason for CRM: Avis with the service center stated that they faxed over an enrollment (start) form that needs to be completed, signed, and faxed back. Avis stated that the fax was sent on 01/20/24. Please call Avis to advise if the fax was received. Call back# [phone] Ext [phone]

## 2024-01-27 NOTE — Telephone Encounter (Signed)
 Copied from CRM [id number]. Topic: Clinical - Prescription Issue >> Jan 27, 2024 12:33 PM [name] wrote: Reason for CRM: [facility] called to speak to Dorothy Pitts. She states the Start form they received for the pts medication Dorothy Pitts) on 03/31 is not acceptable anymore. She states she has sent over a new start form that needs to be completed and faxed back to the service center Fax# [phone] If there are further questions f/u with Karmen at [phone]

## 2024-01-31 ENCOUNTER — Telehealth: Payer: Self-pay

## 2024-01-31 NOTE — Progress Notes (Signed)
 Complex Care Management Care Guide Note  01/31/2024 Name: ORLENE SALMONS MRN: [id number] DOB: [birth year]  BAYLI QUESINBERRY is a 83 y.o. year old female who is a primary care patient of Gabriel Earing, FNP and is actively engaged with the care management team. I reached out to [name] by phone today to assist with re-scheduling  with the Pharmacist.  Follow up plan: Unsuccessful telephone outreach attempt made. A HIPAA compliant phone message was left for the patient providing contact information and requesting a return call.  Penne Lash , RMA     [facility]  [facility], [facility] Guide  Direct Dial: [phone]  Website: [email or web address]

## 2024-02-02 NOTE — Telephone Encounter (Signed)
 Dorothy Pitts form faxed to [facility] on 02/01/24 Fax successful/confirmed Will await authorization from insurance for next steps

## 2024-02-15 ENCOUNTER — Ambulatory Visit

## 2024-03-06 ENCOUNTER — Encounter (HOSPITAL_COMMUNITY): Payer: Self-pay

## 2024-03-07 NOTE — Progress Notes (Signed)
 Care Guide Pharmacy Note  03/07/2024 Name: Dorothy Pitts MRN: [id number] DOB: Feb 05, 1941  Referred By: [name], FNP Reason for referral: Complex Care Management (Outreach to reschedule face to face f/u with Pharm d )   Dorothy Pitts is a 83 y.o. year old female who is a primary care patient of [name], FNP.  Dorothy Pitts was referred to the pharmacist for assistance related to: HLD  Successful contact was made with the patient to discuss pharmacy services including being ready for the pharmacist to call at least 5 minutes before the scheduled appointment time and to have medication bottles and any blood pressure readings ready for review. The patient agreed to meet with the pharmacist via telephone visit on (date/time).04/04/2024  Lenton Rail , RMA     [place]  [facility], [facility] Guide  Direct Dial: [phone]  Website: [email or web address]

## 2024-03-13 ENCOUNTER — Ambulatory Visit: Payer: Medicare Other | Admitting: Family Medicine

## 2024-03-14 ENCOUNTER — Telehealth: Payer: Self-pay | Admitting: Pharmacist

## 2024-03-15 ENCOUNTER — Telehealth: Payer: Self-pay | Admitting: Pharmacy Technician

## 2024-03-15 NOTE — Telephone Encounter (Signed)
 Auth Submission: NO AUTH NEEDED Site of care: Site of care: [facility] Payer: Medicare a/b & mutual of [place] Medication & CPT/J Code(s) submitted: Leqvio (Inclisiran) [phone] Route of submission (phone, fax, portal):  Phone # Fax # Auth type: Buy/Bill HB Units/visits requested: 284mg  q3 months x2 doses, then q6months Reference number:  Approval from: 03/15/24 to 11/25/24     Medicare will cover 80% and Supp will pick-up the remaining 20%. Leqvio will be covered 100%

## 2024-03-28 NOTE — Telephone Encounter (Signed)
 Infusion orders placed for Leqvio to be administered at [facility] Protocol followed; pt to be scheduled PharmD f/u 04/04/24

## 2024-03-28 NOTE — Telephone Encounter (Signed)
 Hi! I'm new with prescribing Leqvio, but this is my patient from [facility].  Is she ready to be scheduled at [facility]? Can you let me know the process?  Thanks for your help! Concha Deed, PharmD CPP

## 2024-03-30 NOTE — Telephone Encounter (Signed)
 Yes, she is ready to be scheduled and we will reach out.  Thank you.

## 2024-03-30 NOTE — Telephone Encounter (Signed)
 Staff at [facility] says they spoke with pt and she is unsure on shot and would like to speak with her MD first.  Thanks

## 2024-04-03 ENCOUNTER — Other Ambulatory Visit: Payer: Self-pay | Admitting: Family Medicine

## 2024-04-03 ENCOUNTER — Encounter: Payer: Self-pay | Admitting: Family Medicine

## 2024-04-03 ENCOUNTER — Ambulatory Visit (INDEPENDENT_AMBULATORY_CARE_PROVIDER_SITE_OTHER): Admitting: Family Medicine

## 2024-04-03 VITALS — BP 113/64 | HR 69 | Temp 97.8°F | Ht 60.5 in | Wt 115.2 lb

## 2024-04-03 DIAGNOSIS — I10 Essential (primary) hypertension: Secondary | ICD-10-CM

## 2024-04-03 DIAGNOSIS — I700 Atherosclerosis of aorta: Secondary | ICD-10-CM

## 2024-04-03 DIAGNOSIS — R7303 Prediabetes: Secondary | ICD-10-CM | POA: Diagnosis not present

## 2024-04-03 DIAGNOSIS — E782 Mixed hyperlipidemia: Secondary | ICD-10-CM | POA: Diagnosis not present

## 2024-04-03 DIAGNOSIS — Z789 Other specified health status: Secondary | ICD-10-CM | POA: Diagnosis not present

## 2024-04-03 DIAGNOSIS — M810 Age-related osteoporosis without current pathological fracture: Secondary | ICD-10-CM

## 2024-04-03 LAB — BAYER DCA HB A1C WAIVED: HB A1C (BAYER DCA - WAIVED): 5.8 % — ABNORMAL HIGH (ref 4.8–5.6)

## 2024-04-03 NOTE — Progress Notes (Unsigned)
   04/03/2024 Name: Dorothy Pitts MRN: [id number] DOB: Apr 23, 1941  No chief complaint on file.   {Visit WUJW:11914}   Subjective:  Care Team: Primary Care Provider: [name], FNP ; Next Scheduled Visit: not yet scheduled {careteamprovider:27366}  Medication Access/Adherence  [phone] [place] [place] - [address] [place] [address] Phone: [phone] Fax: [phone]   Patient reports affordability concerns with their medications: {YES/NO:21197} Patient reports access/transportation concerns to their pharmacy: {YES/NO:21197} Patient reports adherence concerns with their medications:  {YES/NO:21197} ***   Hyperlipidemia/ASCVD Risk Reduction  Current lipid lowering medications: none Medications tried in the past: ezetimibe , rosuvastatin  (has tried even 1/2 tablet three times a week)  ASCVD History: none, aortic atherosclerosis  Family History: mother with stroke at 75  Risk Factors: HLD, HTN  Current physical activity: Enjoys playing golf, hoping to get back in to this now that back injection has significantly improved her pain ??  Current medication access support: ***  Says she eats small portions, Eats out 2x per week Breakfast: 2 boiled eggs, [place] patty, blueberries, grapes, tomato, coffee Lunch: spinach, ranch, pimento cheese with crackers Supper: Salmon, sweet potato, baked potato, greens, chicken Snacks: simple mills non gmo snacks Drinks: no tea, occasional coke, occasional glass of wine, water   Objective:  Lab Results  Component Value Date   HGBA1C 5.8 (H) 04/03/2024    Lab Results  Component Value Date   CREATININE 0.69 11/22/2023   BUN 20 11/22/2023   NA 141 11/22/2023   K 4.2 11/22/2023   CL 101 11/22/2023   [name] 25 11/22/2023    Lab Results  Component Value Date   CHOL 255 (H) 11/22/2023   HDL 77 11/22/2023   LDLCALC 162 (H) 11/22/2023   TRIG 96 11/22/2023    CHOLHDL 3.3 11/22/2023    Medications Reviewed Today   Medications were not reviewed in this encounter       Assessment/Plan:   Hyperlipidemia/ASCVD Risk Reduction: - Currently uncontrolled based on LDL 162 mg/dL. Favor LDL goal <70 mg/dL given aortic atherosclerosis. No history of clinical ASCVD. History of intolerance to rosuvastatin  even at low dose. She does have a history of LDL as high as 204 mg/dL indicating possible familial hyperlipidemia.  - Reviewed long term complications of uncontrolled cholesterol - Reviewed dietary recommendations including *** - Reviewed lifestyle recommendations including *** - Recommend to ***  - Meets financial criteria for *** patient assistance program through ***. Will collaborate with provider, CPhT, and patient to pursue assistance.    Follow Up Plan: ***  Georga Killings, PharmD PGY-1 Pharmacy Resident

## 2024-04-03 NOTE — Progress Notes (Signed)
 Established Patient Office Visit  Subjective   Patient ID: Dorothy Pitts, female    DOB: 1941/04/22  Age: 83 y.o. MRN: [id number]  Chief Complaint  Patient presents with   Medical Management of Chronic Issues    HPI HTN Complaint with meds - Yes Current Medications - spironolactone , enalapril  Checking BP at home - 120s/60s Pertinent ROS:  Headache - No Fatigue - No Visual Disturbances - No Chest pain - No Dyspnea - No Palpitations - No LE edema - No  2. HLD She has not tolerated statins in the past. Stopped taking zetia  as well. Discussed injectables with Julie. [facility] called to schedule injection but she plans to discuss this further with Concha Deed tomorrow before scheduling.   3. Prediabtes Last A1c 5.5. Regular diet. She is active.  4. Osteoporosis On prolia .   Past Medical History:  Diagnosis Date   Acute diverticulitis 10/07/2013   With microperforation.   Anemia 10/08/2013   Bowel obstruction (HCC)    Cataract    Diverticulosis of colon (without mention of hemorrhage)    GERD (gastroesophageal reflux disease)    Hemorrhoids    HOH (hard of hearing)    Hyperlipidemia    Hypertension    IBS (irritable bowel syndrome)    Osteoporosis    PONV (postoperative nausea and vomiting)    Thoracic aortic atherosclerosis (HCC)    UTI (lower urinary tract infection) 10/07/2013   Vitamin D  deficiency       ROS As per HPI.    Objective:     BP 113/64   Pulse 69   Temp 97.8 F (36.6 C)   Ht 5' 0.5" (1.537 m)   Wt 115 lb 3.2 oz (52.3 kg)   SpO2 97%   BMI 22.13 kg/m  BP Readings from Last 3 Encounters:  04/03/24 113/64  09/13/23 119/65  06/22/23 118/66      Physical Exam Vitals and nursing note reviewed.  Constitutional:      General: She is not in acute distress.    Appearance: Normal appearance. She is not ill-appearing, toxic-appearing or diaphoretic.  Neck:     Vascular: No carotid bruit.  Cardiovascular:     Rate and Rhythm: Normal rate and  regular rhythm.     Heart sounds: No murmur heard. Pulmonary:     Effort: Pulmonary effort is normal.     Breath sounds: Normal breath sounds.  Musculoskeletal:     Cervical back: Neck supple.     Right lower leg: No edema.     Left lower leg: No edema.  Skin:    General: Skin is warm and dry.  Neurological:     General: No focal deficit present.     Mental Status: She is alert and oriented to person, place, and time.  Psychiatric:        Mood and Affect: Mood normal.        Behavior: Behavior normal.      No results found for any visits on 04/03/24.    The ASCVD Risk score (Arnett DK, et al., 2019) failed to calculate for the following reasons:   The 2019 ASCVD risk score is only valid for ages 72 to 81    Assessment & Plan:   [name] was seen today for medical management of chronic issues.  Diagnoses and all orders for this visit:  Primary hypertension Well controlled on current regimen.  -     CBC with Differential/Platelet -     BMP8+EGFR  Pre-diabetes A1c pending.  -     Bayer DCA Hb A1c Waived  Mixed hyperlipidemia Statin intolerance Aortic atherosclerosis (HCC) Statin intolerant. Not taking zetia . Will discuss injectables with Concha Deed again tomorrow.   Age-related osteoporosis without current pathological fracture Continue prolia .  -     VITAMIN D  25 Hydroxy (Vit-D Deficiency, Fractures) -     BMP8+EGFR    Return in about 6 months (around 10/03/2024) for chronic follow up. Sooner for new or worsening symptoms.   The patient indicates understanding of these issues and agrees with the plan.  [name], FNP

## 2024-04-04 ENCOUNTER — Ambulatory Visit: Admitting: Pharmacist

## 2024-04-04 DIAGNOSIS — E782 Mixed hyperlipidemia: Secondary | ICD-10-CM

## 2024-04-04 DIAGNOSIS — I700 Atherosclerosis of aorta: Secondary | ICD-10-CM

## 2024-04-04 LAB — CBC WITH DIFFERENTIAL/PLATELET
Basophils Absolute: 0 10*3/uL (ref 0.0–0.2)
Basos: 1 %
EOS (ABSOLUTE): 0.1 10*3/uL (ref 0.0–0.4)
Eos: 3 %
Hematocrit: 39.3 % (ref 34.0–46.6)
Hemoglobin: 13.2 g/dL (ref 11.1–15.9)
Immature Grans (Abs): 0 10*3/uL (ref 0.0–0.1)
Immature Granulocytes: 0 %
Lymphocytes Absolute: 1.2 10*3/uL (ref 0.7–3.1)
Lymphs: 34 %
MCH: 32.4 pg (ref 26.6–33.0)
MCHC: 33.6 g/dL (ref 31.5–35.7)
MCV: 96 fL (ref 79–97)
Monocytes Absolute: 0.3 10*3/uL (ref 0.1–0.9)
Monocytes: 8 %
Neutrophils Absolute: 1.9 10*3/uL (ref 1.4–7.0)
Neutrophils: 53 %
Platelets: 247 10*3/uL (ref 150–450)
RBC: 4.08 x10E6/uL (ref 3.77–5.28)
RDW: 11.5 % — ABNORMAL LOW (ref 11.7–15.4)
WBC: 3.5 10*3/uL (ref 3.4–10.8)

## 2024-04-04 LAB — BMP8+EGFR
BUN/Creatinine Ratio: 21 (ref 12–28)
BUN: 13 mg/dL (ref 8–27)
CO2: 22 mmol/L (ref 20–29)
Calcium: 9.8 mg/dL (ref 8.7–10.3)
Chloride: 104 mmol/L (ref 96–106)
Creatinine, Ser: 0.61 mg/dL (ref 0.57–1.00)
Glucose: 90 mg/dL (ref 70–99)
Potassium: 4.5 mmol/L (ref 3.5–5.2)
Sodium: 144 mmol/L (ref 134–144)
eGFR: 89 mL/min/{1.73_m2} (ref >59)

## 2024-04-04 LAB — VITAMIN D 25 HYDROXY (VIT D DEFICIENCY, FRACTURES): Vit D, 25-Hydroxy: 41.7 ng/mL (ref 30.0–100.0)

## 2024-04-05 ENCOUNTER — Ambulatory Visit: Payer: Self-pay | Admitting: Family Medicine

## 2024-04-11 ENCOUNTER — Other Ambulatory Visit

## 2024-04-11 ENCOUNTER — Ambulatory Visit (INDEPENDENT_AMBULATORY_CARE_PROVIDER_SITE_OTHER)

## 2024-04-11 VITALS — BP 113/64 | HR 69 | Ht 60.0 in | Wt 115.0 lb

## 2024-04-11 DIAGNOSIS — Z0000 Encounter for general adult medical examination without abnormal findings: Secondary | ICD-10-CM | POA: Diagnosis not present

## 2024-04-11 DIAGNOSIS — E782 Mixed hyperlipidemia: Secondary | ICD-10-CM

## 2024-04-11 NOTE — Progress Notes (Signed)
 Subjective:   Dorothy Pitts is a 83 y.o. who presents for a Medicare Wellness preventive visit.  As a reminder, Annual Wellness Visits don't include a physical exam, and some assessments may be limited, especially if this visit is performed virtually. We may recommend an in-person follow-up visit with your provider if needed.  Visit Complete: Virtual I connected with  Benton Butter on 04/11/24 by a video and audio enabled telemedicine application and verified that I am speaking with the correct person using two identifiers.  Patient Location: Home  Provider Location: Home Office  I discussed the limitations of evaluation and management by telemedicine. The patient expressed understanding and agreed to proceed.  Vital Signs: Because this visit was a virtual/telehealth visit, some criteria may be missing or patient reported. Any vitals not documented were not able to be obtained and vitals that have been documented are patient reported.  VideoDeclined- This patient declined Librarian, academic. Therefore the visit was completed with audio only.  Persons Participating in Visit: Patient.  AWV Questionnaire: No: Patient Medicare AWV questionnaire was not completed prior to this visit.  Cardiac Risk Factors include: advanced age (>77men, >33 women);dyslipidemia;hypertension     Objective:    Today's Vitals   04/11/24 1343  BP: 113/64  Pulse: 69  Weight: 115 lb (52.2 kg)  Height: 5' (1.524 m)   Body mass index is 22.46 kg/m.     04/11/2024    1:56 PM 01/11/2023    2:25 PM 09/21/2022   11:53 AM 01/07/2022    4:08 PM 09/30/2021   12:27 PM 01/06/2021    3:00 PM 09/04/2019    9:58 AM  Advanced Directives  Does Patient Have a Medical Advance Directive? Yes Yes Yes No Yes Yes No  Type of Estate agent of [place];Living will Healthcare Power of [place];Living will    Healthcare Power of [place];Living will   Does patient want  to make changes to medical advance directive?      No - Patient declined   Copy of Healthcare Power of Attorney in Chart? No - copy requested No - copy requested    No - copy requested   Would patient like information on creating a medical advance directive?    No - Patient declined   No - Patient declined    Current Medications (verified) Outpatient Encounter Medications as of 04/11/2024  Medication Sig   Ascorbic Acid (VITAMIN C) 1000 MG tablet Take 1,000 mg by mouth daily. Reported on 11/21/2015   benzonatate  (TESSALON ) 200 MG capsule Take 1 capsule (200 mg total) by mouth 3 (three) times daily as needed.   Calcium  Carbonate-Vitamin D  600-10 MG-MCG TABS Take 1 tablet by mouth daily.   Cholecalciferol (VITAMIN D3) 5000 units CAPS Take 1 capsule by mouth every Monday, Wednesday, and Friday.   denosumab  (PROLIA ) 60 MG/ML SOSY injection Inject 60 mg into the skin every 6 (six) months.   enalapril  (VASOTEC ) 5 MG tablet TAKE 1 TABLET (5 MG TOTAL) BY MOUTH DAILY.   famotidine -calcium  carbonate-magnesium  hydroxide (PEPCID  COMPLETE) 10-800-165 MG chewable tablet Chew 1 tablet by mouth daily.   Polyethylene Glycol 3350  (MIRALAX PO) Take 1 Dose by mouth daily.   spironolactone  (ALDACTONE ) 50 MG tablet Take 1 tablet (50 mg total) by mouth daily.   Facility-Administered Encounter Medications as of 04/11/2024  Medication   [START ON 06/04/2024] denosumab  (PROLIA ) injection 60 mg    Allergies (verified) Tolmetin, Actonel  [risedronate  sodium], Docosahexaenoic acid (dha) (fish), Fosamax  [  alendronate  sodium], Crestor  [rosuvastatin  calcium ], Nsaids, Other, Augmentin  [amoxicillin -pot clavulanate], Cephalexin, Cephalosporins, Ciprofloxacin , and Lovaza [omega-3-acid ethyl esters (fish)]   History: Past Medical History:  Diagnosis Date   Acute diverticulitis 10/07/2013   With microperforation.   Anemia 10/08/2013   Bowel obstruction (HCC)    Cataract    Diverticulosis of colon (without mention of  hemorrhage)    GERD (gastroesophageal reflux disease)    Hemorrhoids    HOH (hard of hearing)    Hyperlipidemia    Hypertension    IBS (irritable bowel syndrome)    Osteoporosis    PONV (postoperative nausea and vomiting)    Thoracic aortic atherosclerosis (HCC)    UTI (lower urinary tract infection) 10/07/2013   Vitamin D  deficiency    Past Surgical History:  Procedure Laterality Date   ABDOMINAL HYSTERECTOMY     APPENDECTOMY     BILATERAL OOPHORECTOMY     BREAST LUMPECTOMY Left    CATARACT EXTRACTION     COLON RESECTION N/A 12/01/2013   Procedure: HAND ASSISTED LAPAROSCOPIC PARTIAL COLECTOMY;  Surgeon: Beau Bound, MD;  Location: [facility];  Service: General;  Laterality: N/A;   EYE SURGERY     HEMORRHOID SURGERY     HERNIA REPAIR  09/04/2016   LAPAROSCOPIC VENTRAL INCISIONAL HERNIA REPAIR POSSIBLE OPEN (N/A)   INCISIONAL HERNIA REPAIR N/A 09/04/2016   Procedure: LAPAROSCOPIC VENTRAL INCISIONAL HERNIA REPAIR POSSIBLE OPEN;  Surgeon: Juanita Norlander, MD;  Location: [facility];  Service: General;  Laterality: N/A;   INSERTION OF MESH N/A 09/04/2016   Procedure: INSERTION OF MESH;  Surgeon: Juanita Norlander, MD;  Location: [facility];  Service: General;  Laterality: N/A;   PARTIAL HYSTERECTOMY     TONSILLECTOMY     Family History  Problem Relation Age of Onset   Hypertension Mother    Heart attack Mother 47   Parkinson's disease Father    Heart disease Brother 66       bypass   Obesity Brother    Asthma Son    Other Son        Gout   Colon cancer Neg Hx    Social History   Socioeconomic History   Marital status: Widowed    Spouse name: Not on file   Number of children: 3   Years of education: 15   Highest education level: Some college, no degree  Occupational History   Occupation: retired    Associate Professor: RETIRED  Tobacco Use   Smoking status: Former    Current packs/day: 0.00    Average packs/day: 1 pack/day for 35.0 years (35.0 ttl pk-yrs)    Types: Cigarettes    Start date:  10/27/1967    Quit date: 10/26/2002    Years since quitting: 21.4   Smokeless tobacco: Never  Vaping Use   Vaping status: Never Used  Substance and Sexual Activity   Alcohol use: Yes    Alcohol/week: 4.0 standard drinks of alcohol    Types: 4 Glasses of wine per week    Comment: wine   Drug use: No   Sexual activity: Not Currently  Other Topics Concern   Not on file  Social History Narrative   Son and grandson live with her for now - 2023   Social Drivers of Health   Financial Resource Strain: Low Risk  (04/11/2024)   Overall Financial Resource Strain (CARDIA)    Difficulty of Paying Living Expenses: Not hard at all  Food Insecurity: No Food Insecurity (04/11/2024)   Hunger Vital Sign  Worried About Programme researcher, broadcasting/film/video in the Last Year: Never true    Ran Out of Food in the Last Year: Never true  Transportation Needs: No Transportation Needs (04/11/2024)   PRAPARE - Administrator, Civil Service (Medical): No    Lack of Transportation (Non-Medical): No  Physical Activity: Insufficiently Active (04/11/2024)   Exercise Vital Sign    Days of Exercise per Week: 4 days    Minutes of Exercise per Session: 30 min  Stress: No Stress Concern Present (04/11/2024)   [employer] of Occupational Health - Occupational Stress Questionnaire    Feeling of Stress: Not at all  Social Connections: Moderately Integrated (04/11/2024)   Social Connection and Isolation Panel    Frequency of Communication with Friends and Family: More than three times a week    Frequency of Social Gatherings with Friends and Family: More than three times a week    Attends Religious Services: More than 4 times per year    Active Member of [employer] or Organizations: Yes    Attends Banker Meetings: 1 to 4 times per year    Marital Status: Widowed    Tobacco Counseling Counseling given: Yes    Clinical Intake:  Pre-visit preparation completed: Yes  Pain : No/denies pain     BMI -  recorded: 22.46 Nutritional Status: BMI of 19-24  Normal Nutritional Risks: None Diabetes: No  Lab Results  Component Value Date   HGBA1C 5.8 (H) 04/03/2024   HGBA1C 5.4 11/22/2023   HGBA1C 5.9 (H) 03/10/2023     How often do you need to have someone help you when you read instructions, pamphlets, or other written materials from your doctor or pharmacy?: 1 - Never  Interpreter Needed?: No  Information entered by :: Alia T/cma   Activities of Daily Living     04/11/2024    1:51 PM  In your present state of health, do you have any difficulty performing the following activities:  Hearing? 0  Vision? 0  Difficulty concentrating or making decisions? 1  Comment forget sometimes  Walking or climbing stairs? 0  Dressing or bathing? 0  Doing errands, shopping? 0  Preparing Food and eating ? N  Using the Toilet? N  In the past six months, have you accidently leaked urine? Y  Do you have problems with loss of bowel control? N  Managing your Medications? N  Managing your Finances? N  Housekeeping or managing your Housekeeping? N    Patient Care Team: [name], FNP as PCP - General (Family Medicine) Pyrtle, Amber Bail, MD as Consulting Physician (Gastroenterology) Bonita Bussing, MD as Consulting Physician (Obstetrics and Gynecology) Adalberto Acton, MD as Consulting Physician (General Surgery)  I have updated your Care Teams any recent Medical Services you may have received from other providers in the past year.     Assessment:   This is a routine wellness examination for [place].  Hearing/Vision screen Hearing Screening - Comments:: Pt wear hearing aids Vision Screening - Comments:: Pt wear glasses denies vision dif/pt goes Dr. Candi Chafe in [place] ov 2-62yrs ago/will make an appt soon   Goals Addressed   None    Depression Screen     04/11/2024    1:58 PM 04/03/2024    9:01 AM 09/13/2023    2:03 PM 06/22/2023    1:06 PM 06/08/2023    1:02 PM 05/26/2023     3:38 PM 05/20/2023   10:48 AM  PHQ 2/9 Scores  PHQ - 2 Score 0 0 0 0 0 0 0  PHQ- 9 Score 0 0 0 0 0 0 0    Fall Risk     04/11/2024    1:46 PM 04/03/2024    9:01 AM 09/13/2023    2:01 PM 06/22/2023    1:07 PM 06/08/2023    1:03 PM  Fall Risk   Falls in the past year? 0 0 0 0 0  Number falls in past yr: 0 0  0 0  Injury with Fall? 0 0  0 0  Risk for fall due to : No Fall Risks No Fall Risks  No Fall Risks No Fall Risks  Follow up Falls evaluation completed Falls evaluation completed  Falls evaluation completed Falls evaluation completed    MEDICARE RISK AT HOME:  Medicare Risk at Home Any stairs in or around the home?: Yes If so, are there any without handrails?: Yes Home free of loose throw rugs in walkways, pet beds, electrical cords, etc?: Yes Adequate lighting in your home to reduce risk of falls?: Yes Life alert?: No Use of a cane, walker or w/c?: No Grab bars in the bathroom?: Yes Shower chair or bench in shower?: Yes Elevated toilet seat or a handicapped toilet?: Yes  TIMED UP AND GO:  Was the test performed?  no  Cognitive Function: 6CIT completed    08/23/2018    8:18 AM 11/21/2015    9:29 AM  MMSE - Mini Mental State Exam  Orientation to time 5 5   Orientation to Place 5 5   Registration 3 3   Attention/ Calculation 5 5   Recall 3 3   Language- name 2 objects 2 2   Language- repeat 1 1  Language- follow 3 step command 3 3   Language- read & follow direction 1 1   Write a sentence 1 1   Copy design 1 1   Total score 30 30      Data saved with a previous flowsheet row definition        04/11/2024    2:00 PM 01/11/2023    2:26 PM 01/06/2021    3:03 PM 09/04/2019   10:03 AM  6CIT Screen  What Year? 0 points 0 points 0 points 0 points  What month? 0 points 0 points 0 points 0 points  What time? 0 points 0 points 0 points 0 points  Count back from 20 0 points 0 points 0 points 0 points  Months in reverse 0 points 0 points 0 points 0 points  Repeat phrase  0 points 0 points 0 points 0 points  Total Score 0 points 0 points 0 points 0 points    Immunizations Immunization History  Administered Date(s) Administered   Td 04/19/2006   Tdap 12/30/2020   Zoster, Live 12/24/2009    Screening Tests Health Maintenance  Topic Date Due   Pneumococcal Vaccine: 50+ Years (1 of 1 - PCV) Never done   Zoster Vaccines- Shingrix (1 of 2) 06/09/2024 (Originally 07/10/1991)   INFLUENZA VACCINE  05/26/2024   DEXA SCAN  10/06/2024   Medicare Annual Wellness (AWV)  04/11/2025   DTaP/Tdap/Td (3 - Td or Tdap) 12/31/2030   HPV VACCINES  Aged Out   Meningococcal B Vaccine  Aged Out   COVID-19 Vaccine  Discontinued    Health Maintenance  Health Maintenance Due  Topic Date Due   Pneumococcal Vaccine: 50+ Years (1 of 1 - PCV) Never done   Health Maintenance  Items Addressed: See Nurse Notes at the end of this note  Additional Screening:  Vision Screening: Recommended annual ophthalmology exams for early detection of glaucoma and other disorders of the eye. Would you like a referral to an eye doctor? No    Dental Screening: Recommended annual dental exams for proper oral hygiene  Community Resource Referral / Chronic Care Management: CRR required this visit?  No   CCM required this visit?  No   Plan:    I have personally reviewed and noted the following in the patient's chart:   Medical and social history Use of alcohol, tobacco or illicit drugs  Current medications and supplements including opioid prescriptions. Patient is not currently taking opioid prescriptions. Functional ability and status Nutritional status Physical activity Advanced directives List of other physicians Hospitalizations, surgeries, and ER visits in previous 12 months Vitals Screenings to include cognitive, depression, and falls Referrals and appointments  In addition, I have reviewed and discussed with patient certain preventive protocols, quality metrics, and best  practice recommendations. A written personalized care plan for preventive services as well as general preventive health recommendations were provided to patient.   Michaelle Adolphus, CMA   04/11/2024   After Visit Summary: (MyChart) Due to this being a telephonic visit, the after visit summary with patients personalized plan was offered to patient via MyChart   Notes: pt is due pneumonia vaccine and will get it next visit

## 2024-04-11 NOTE — Patient Instructions (Signed)
 Dorothy Pitts , Thank you for taking time out of your busy schedule to complete your Annual Wellness Visit with me. I enjoyed our conversation and look forward to speaking with you again next year. I, as well as your care team,  appreciate your ongoing commitment to your health goals. Please review the following plan we discussed and let me know if I can assist you in the future. Your Game plan/ To Do List    Follow up Visits: Next Medicare AWV with our clinical staff: 04/12/25 at 2:30p.m.    Next Office Visit with your provider: n/a  Clinician Recommendations:  Aim for 30 minutes of exercise or brisk walking, 6-8 glasses of water, and 5 servings of fruits and vegetables each day.       This is a list of the screening recommended for you and due dates:  Health Maintenance  Topic Date Due   Pneumococcal Vaccine for age over 42 (1 of 1 - PCV) Never done   Zoster (Shingles) Vaccine (1 of 2) 06/09/2024*   Flu Shot  05/26/2024   DEXA scan (bone density measurement)  10/06/2024   Medicare Annual Wellness Visit  04/11/2025   DTaP/Tdap/Td vaccine (3 - Td or Tdap) 12/31/2030   HPV Vaccine  Aged Out   Meningitis B Vaccine  Aged Out   COVID-19 Vaccine  Discontinued  *Topic was postponed. The date shown is not the original due date.    Advanced directives: (Copy Requested) Please bring a copy of your health care power of attorney and living will to the office to be added to your chart at your convenience. You can mail to [facility] [address] [place], [place] [address] or email to [email or web address] Advance Care Planning is important because it:  [x]  Makes sure you receive the medical care that is consistent with your values, goals, and preferences  [x]  It provides guidance to your family and loved ones and reduces their decisional burden about whether or not they are making the right decisions based on your wishes.  Follow the link provided in your after visit  summary or read over the paperwork we have mailed to you to help you started getting your Advance Directives in place. If you need assistance in completing these, please reach out to us  so that we can help you!  See attachments for Preventive Care and Fall Prevention Tips.

## 2024-04-12 ENCOUNTER — Ambulatory Visit: Payer: Self-pay | Admitting: Family Medicine

## 2024-04-12 LAB — LDL CHOLESTEROL, DIRECT: LDL Direct: 153 mg/dL — ABNORMAL HIGH (ref 0–99)

## 2024-04-12 NOTE — Telephone Encounter (Signed)
 Called patient to follow up on LDL result as planned. Discussed that LDL has remained elevated and we would recommend that she start Leqvio as previously discussed. Leqvio was approved by her insurance and she was set up to receive injection at [facility] infusion center. However, patient declined and decided she does not want to start any new medications. Encouraged adherence to heart healthy diet. Will follow up with PCP and PharmD as needed.

## 2024-05-04 ENCOUNTER — Telehealth: Payer: Self-pay

## 2024-05-04 NOTE — Telephone Encounter (Signed)
 Prolia  VOB initiated via [email or web address]  Next Prolia  inj DUE: 06/04/24

## 2024-05-04 NOTE — Telephone Encounter (Signed)
 Pt ready for scheduling for PROLIA  on or after : 06/04/24  Option# 1: Buy/Bill (Office supplied medication)  Out-of-pocket cost due at time of clinic visit: $0  Number of injection/visits approved: ---  Primary: MEDICARE Prolia  co-insurance: 0% Admin fee co-insurance: 0%  Secondary: MUTUAL OF [place]-MEDSUP Prolia  co-insurance:  Admin fee co-insurance:   Medical Benefit Details: Date Benefits were checked: 05/04/24 Deductible: $257 Met of $257 Required/ Coinsurance: 0%/ Admin Fee: 0%  Prior Auth: N/A PA# Expiration Date:   # of doses approved: ----------------------------------------------------------------------- Option# 2- Med Obtained from pharmacy:  Pharmacy benefit: Copay $--- (Paid to pharmacy) Admin Fee: --- (Pay at clinic)  Prior Auth: --- PA# Expiration Date:   # of doses approved:   If patient wants fill through the pharmacy benefit please send prescription to: ---, and include estimated need by date in rx notes. Pharmacy will ship medication directly to the office.  Patient NOT eligible for Prolia  Copay Card. Copay Card can make patient's cost as little as $25. Link to apply: https://www.amgensupportplus.com/copay  ** This summary of benefits is an estimation of the patient's out-of-pocket cost. Exact cost may very based on individual plan coverage.

## 2024-05-04 NOTE — Telephone Encounter (Signed)
 SABRA

## 2024-05-10 ENCOUNTER — Other Ambulatory Visit: Payer: Self-pay | Admitting: Family Medicine

## 2024-05-10 DIAGNOSIS — I10 Essential (primary) hypertension: Secondary | ICD-10-CM

## 2024-06-05 LAB — HM MAMMOGRAPHY

## 2024-06-07 ENCOUNTER — Ambulatory Visit

## 2024-06-07 DIAGNOSIS — M810 Age-related osteoporosis without current pathological fracture: Secondary | ICD-10-CM | POA: Diagnosis not present

## 2024-06-07 NOTE — Progress Notes (Signed)
 Patient is in office today for a nurse visit for Prolia . Patient Injection was given in the  Left arm. Patient tolerated injection well.

## 2024-07-10 ENCOUNTER — Other Ambulatory Visit: Payer: Self-pay | Admitting: Family Medicine

## 2024-07-10 DIAGNOSIS — I10 Essential (primary) hypertension: Secondary | ICD-10-CM

## 2024-07-10 NOTE — Telephone Encounter (Signed)
 This was RFd 04/03/24 #90 w/ 1 RF, pt took old bottle which she read to me so I called [facility] and the informed me that it was ready for her to pick up, called her back & let her know.

## 2024-07-10 NOTE — Telephone Encounter (Signed)
 Pt came by checking on rx and she said she only has one pill left

## 2024-10-04 ENCOUNTER — Other Ambulatory Visit: Payer: Self-pay | Admitting: Family Medicine

## 2024-10-04 DIAGNOSIS — I10 Essential (primary) hypertension: Secondary | ICD-10-CM

## 2024-10-27 ENCOUNTER — Other Ambulatory Visit: Payer: Self-pay | Admitting: Family Medicine

## 2024-10-27 DIAGNOSIS — I10 Essential (primary) hypertension: Secondary | ICD-10-CM

## 2024-10-31 ENCOUNTER — Telehealth: Payer: Self-pay | Admitting: Family Medicine

## 2024-10-31 ENCOUNTER — Encounter: Payer: Self-pay | Admitting: Family Medicine

## 2024-10-31 ENCOUNTER — Other Ambulatory Visit: Payer: Self-pay | Admitting: Family Medicine

## 2024-10-31 DIAGNOSIS — I10 Essential (primary) hypertension: Secondary | ICD-10-CM

## 2024-10-31 NOTE — Telephone Encounter (Unsigned)
 Copied from CRM [id number]. Topic: Clinical - Medication Question >> Oct 31, 2024  4:05 PM Selinda RAMAN wrote: Reason for CRM: The patient called in stating she would like a small fill of her enalapril  (VASOTEC ) 5 MG tablet to cover her for 4 days from the 10th to the 14th when she has an appointment with her provider. The patient uses  [id number] - [place], [place] - [address]  Phone: [phone] Fax: [phone]   Please assist patient further

## 2024-10-31 NOTE — Telephone Encounter (Signed)
"  Letter sent  "

## 2024-10-31 NOTE — Telephone Encounter (Signed)
 Tiffany pt NTBS 30-d given 10/04/24

## 2024-11-01 MED ORDER — ENALAPRIL MALEATE 5 MG PO TABS: 5.0000 mg | ORAL_TABLET | Freq: Every day | ORAL | 0 refills | Status: DC

## 2024-11-01 NOTE — Telephone Encounter (Signed)
Rx sent in and pt is aware. 

## 2024-11-07 ENCOUNTER — Other Ambulatory Visit: Payer: Self-pay | Admitting: Family Medicine

## 2024-11-07 DIAGNOSIS — I10 Essential (primary) hypertension: Secondary | ICD-10-CM

## 2024-11-08 ENCOUNTER — Encounter: Payer: Self-pay | Admitting: Family Medicine

## 2024-11-08 ENCOUNTER — Ambulatory Visit (INDEPENDENT_AMBULATORY_CARE_PROVIDER_SITE_OTHER): Admitting: Family Medicine

## 2024-11-08 VITALS — BP 124/68 | HR 74 | Ht 60.0 in | Wt 118.2 lb

## 2024-11-08 DIAGNOSIS — I10 Essential (primary) hypertension: Secondary | ICD-10-CM

## 2024-11-08 DIAGNOSIS — Z789 Other specified health status: Secondary | ICD-10-CM

## 2024-11-08 DIAGNOSIS — E782 Mixed hyperlipidemia: Secondary | ICD-10-CM | POA: Diagnosis not present

## 2024-11-08 DIAGNOSIS — M810 Age-related osteoporosis without current pathological fracture: Secondary | ICD-10-CM

## 2024-11-08 DIAGNOSIS — R7303 Prediabetes: Secondary | ICD-10-CM | POA: Diagnosis not present

## 2024-11-08 LAB — BAYER DCA HB A1C WAIVED: HB A1C (BAYER DCA - WAIVED): 5.2 % (ref 4.8–5.6)

## 2024-11-08 MED ORDER — ENALAPRIL MALEATE 5 MG PO TABS: 5.0000 mg | ORAL_TABLET | Freq: Every day | ORAL | 3 refills | Status: AC

## 2024-11-08 NOTE — Progress Notes (Signed)
 "  Established Patient Office Visit  Subjective   Patient ID: Dorothy Pitts, female    DOB: [birth year]  Age: 84 y.o. MRN: [id number]  Chief Complaint  Patient presents with   Medical Management of Chronic Issues    HPI  History of Present Illness   Dorothy Pitts is an 84 year old female with hypertension who presents for a follow-up visit.  Hypertension management - Requires refill of Vasotec  for blood pressure control - [facility] pending this appointment - No chest pain, shortness of breath, or peripheral edema  Osteoporosis management - Took calcium  today so unable to get DEXA - Last Prolia  injection was administered in August  General functioning and well-being - Remains active - Sleeping well - Regular diet        ROS Negative unless specially indicated above in HPI.    Objective:     BP 124/68   Pulse 74   Ht 5' (1.524 m)   Wt 118 lb 3.2 oz (53.6 kg)   BMI 23.08 kg/m    Physical Exam Vitals and nursing note reviewed.  Constitutional:      General: She is not in acute distress.    Appearance: Normal appearance. She is not ill-appearing, toxic-appearing or diaphoretic.  Neck:     Vascular: No carotid bruit.  Cardiovascular:     Rate and Rhythm: Normal rate and regular rhythm.     Heart sounds: No murmur heard. Pulmonary:     Effort: Pulmonary effort is normal.     Breath sounds: Normal breath sounds.  Musculoskeletal:     Cervical back: Neck supple.     Right lower leg: No edema.     Left lower leg: No edema.  Skin:    General: Skin is warm and dry.  Neurological:     General: No focal deficit present.     Mental Status: She is alert and oriented to person, place, and time.  Psychiatric:        Mood and Affect: Mood normal.        Behavior: Behavior normal.      No results found for any visits on 11/08/24.    The ASCVD Risk score (Arnett DK, et al., 2019) failed to calculate for the  following reasons:   The 2019 ASCVD risk score is only valid for ages 52 to 55   * - Cholesterol units were assumed    Assessment & Plan:   Dorothy Pitts was seen today for medical management of chronic issues.  Diagnoses and all orders for this visit:  Primary hypertension -     enalapril  (VASOTEC ) 5 MG tablet; Take 1 tablet (5 mg total) by mouth daily. -     BMP8+EGFR  Pre-diabetes -     Bayer DCA Hb A1c Waived  Age-related osteoporosis without current pathological fracture  Mixed hyperlipidemia  Statin intolerance   Assessment and Plan    Primary hypertension Blood pressure well-controlled with current medication. No symptoms reported. - Sent refill for Vasotec  to pharmacy. - Will check BMP given spironolactone  and vasotec  use.   Age-related osteoporosis Prolia  injection due in one month. DEXA scan pending. - Schedule DEXA scan for next week. - Coordinate with Melissa for Prolia  administration.  Prediabetes A1c pending.   Mixed hyperlipidemia Statin intolerance Declined leqvio.    Return in about 26 weeks (around 05/09/2025) for schedule DEXA, CPE.   The patient indicates understanding of these issues and agrees with  the plan.  Dorothy CHRISTELLA Search, FNP "

## 2024-11-09 ENCOUNTER — Ambulatory Visit: Payer: Self-pay | Admitting: Family Medicine

## 2024-11-09 LAB — BMP8+EGFR
BUN/Creatinine Ratio: 34 — ABNORMAL HIGH (ref 12–28)
BUN: 26 mg/dL (ref 8–27)
CO2: 23 mmol/L (ref 20–29)
Calcium: 10.1 mg/dL (ref 8.7–10.3)
Chloride: 98 mmol/L (ref 96–106)
Creatinine, Ser: 0.76 mg/dL (ref 0.57–1.00)
Glucose: 114 mg/dL — ABNORMAL HIGH (ref 70–99)
Potassium: 4.5 mmol/L (ref 3.5–5.2)
Sodium: 136 mmol/L (ref 134–144)
eGFR: 78 mL/min/1.73

## 2024-11-10 ENCOUNTER — Telehealth: Payer: Self-pay | Admitting: Family Medicine

## 2024-11-10 NOTE — Telephone Encounter (Signed)
 Pt wants to know when she is due for prolia  shot

## 2024-11-10 NOTE — Telephone Encounter (Signed)
 Called to speak with patient she is aware due next month and wants they get it ran through insurance they will call her to schedule an appt.

## 2024-11-15 ENCOUNTER — Telehealth: Payer: Self-pay

## 2024-11-15 DIAGNOSIS — M810 Age-related osteoporosis without current pathological fracture: Secondary | ICD-10-CM

## 2024-11-15 MED ORDER — DENOSUMAB 60 MG/ML ~~LOC~~ SOSY: 60.0000 mg | PREFILLED_SYRINGE | Freq: Once | SUBCUTANEOUS | Status: AC

## 2024-11-15 NOTE — Telephone Encounter (Signed)
 Prolia  sent for benefit verification

## 2024-11-15 NOTE — Telephone Encounter (Signed)
 Prolia  VOB initiated via [email or web address]  Next Prolia  inj DUE: 12/08/24

## 2024-11-17 ENCOUNTER — Other Ambulatory Visit (HOSPITAL_COMMUNITY): Payer: Self-pay

## 2024-11-17 NOTE — Telephone Encounter (Signed)
Called patient no answer and no VM

## 2024-11-17 NOTE — Telephone Encounter (Signed)
 Pt ready for scheduling for PROLIA  on or after : 12/08/24  Option# 1: Buy/Bill (Office supplied medication)  Out-of-pocket cost due at time of clinic visit: $0  Number of injection/visits approved: ---  Primary: MEDICARE Prolia  co-insurance: 0% Admin fee co-insurance: 0%  Secondary: [employer] Prolia  co-insurance: covers the Medicare Part B deductible, co-insurance and 100% of the excess charges Admin fee co-insurance:   Medical Benefit Details: Date Benefits were checked: 11/15/24 Deductible: $37.28 Met of $283 Required/ Coinsurance: 0%/ Admin Fee: 0%  Prior Auth: N/A PA# Expiration Date:   # of doses approved: ----------------------------------------------------------------------- Option# 2- Med Obtained from pharmacy:  Pharmacy benefit: Copay $1170.91 (Paid to pharmacy) Admin Fee: 0% (Pay at clinic)  Prior Auth: N/A PA# Expiration Date:   # of doses approved:   If patient wants fill through the pharmacy benefit please send prescription to: [facility], and include estimated need by date in rx notes. Pharmacy will ship medication directly to the office.  Patient NOT eligible for Prolia  Copay Card. Copay Card can make patient's cost as little as $25. Link to apply: [email or web address]://[email or web address]  ** This summary of benefits is an estimation of the patient's out-of-pocket cost. Exact cost may very based on individual plan coverage.

## 2024-11-17 NOTE — Telephone Encounter (Signed)
 Dorothy Pitts

## 2024-12-11 ENCOUNTER — Ambulatory Visit

## 2025-04-12 ENCOUNTER — Ambulatory Visit: Payer: Self-pay

## 2025-05-08 ENCOUNTER — Ambulatory Visit

## 2025-05-09 ENCOUNTER — Encounter: Admitting: Family Medicine
